# Patient Record
Sex: Female | Born: 1945 | Race: White | Hispanic: No | Marital: Single | Smoking: Never smoker
Health system: Southern US, Community
[De-identification: ages and names within clinical notes are randomized; demographics above are authoritative.]

## PROBLEM LIST (undated history)

## (undated) DIAGNOSIS — G459 Transient cerebral ischemic attack, unspecified: Secondary | ICD-10-CM

## (undated) DIAGNOSIS — I480 Paroxysmal atrial fibrillation: Secondary | ICD-10-CM

## (undated) HISTORY — PX: EYE SURGERY: SHX253

## (undated) HISTORY — DX: Transient cerebral ischemic attack, unspecified: G45.9

---

## 2000-06-19 ENCOUNTER — Emergency Department (HOSPITAL_COMMUNITY): Admission: EM | Admit: 2000-06-19 | Discharge: 2000-06-20 | Payer: Self-pay | Admitting: *Deleted

## 2000-08-26 ENCOUNTER — Encounter: Admission: RE | Admit: 2000-08-26 | Discharge: 2000-11-24 | Payer: Self-pay | Admitting: *Deleted

## 2003-03-23 ENCOUNTER — Ambulatory Visit (HOSPITAL_COMMUNITY): Admission: RE | Admit: 2003-03-23 | Discharge: 2003-03-23 | Payer: Self-pay | Admitting: Family Medicine

## 2007-10-05 ENCOUNTER — Encounter: Admission: RE | Admit: 2007-10-05 | Discharge: 2007-10-05 | Payer: Self-pay | Admitting: Family Medicine

## 2008-05-25 ENCOUNTER — Ambulatory Visit (HOSPITAL_COMMUNITY): Admission: RE | Admit: 2008-05-25 | Discharge: 2008-05-25 | Payer: Self-pay | Admitting: Family Medicine

## 2009-11-01 ENCOUNTER — Encounter: Admission: RE | Admit: 2009-11-01 | Discharge: 2009-11-01 | Payer: Self-pay | Admitting: Family Medicine

## 2010-02-18 ENCOUNTER — Ambulatory Visit (HOSPITAL_COMMUNITY): Admission: RE | Admit: 2010-02-18 | Discharge: 2010-02-18 | Payer: Self-pay | Admitting: Family Medicine

## 2010-05-01 ENCOUNTER — Encounter: Payer: Self-pay | Admitting: Family Medicine

## 2012-03-26 DIAGNOSIS — Z0000 Encounter for general adult medical examination without abnormal findings: Secondary | ICD-10-CM | POA: Insufficient documentation

## 2013-07-05 ENCOUNTER — Encounter (HOSPITAL_COMMUNITY): Payer: Self-pay | Admitting: Emergency Medicine

## 2013-07-05 ENCOUNTER — Other Ambulatory Visit (HOSPITAL_COMMUNITY): Payer: Self-pay | Admitting: Family Medicine

## 2013-07-05 ENCOUNTER — Emergency Department (HOSPITAL_COMMUNITY): Payer: Medicare Other

## 2013-07-05 ENCOUNTER — Emergency Department (HOSPITAL_COMMUNITY)
Admission: EM | Admit: 2013-07-05 | Discharge: 2013-07-05 | Disposition: A | Discharge status: Home / Self Care | Payer: Medicare Other | Attending: Emergency Medicine | Admitting: Emergency Medicine

## 2013-07-05 DIAGNOSIS — R55 Syncope and collapse: Secondary | ICD-10-CM | POA: Insufficient documentation

## 2013-07-05 DIAGNOSIS — R209 Unspecified disturbances of skin sensation: Secondary | ICD-10-CM | POA: Diagnosis present

## 2013-07-05 DIAGNOSIS — Z8673 Personal history of transient ischemic attack (TIA), and cerebral infarction without residual deficits: Secondary | ICD-10-CM | POA: Insufficient documentation

## 2013-07-05 DIAGNOSIS — R51 Headache: Secondary | ICD-10-CM | POA: Insufficient documentation

## 2013-07-05 DIAGNOSIS — R200 Anesthesia of skin: Secondary | ICD-10-CM

## 2013-07-05 LAB — CBC WITH DIFFERENTIAL/PLATELET
BASOS ABS: 0 10*3/uL (ref 0.0–0.1)
Basophils Relative: 0 % (ref 0–1)
Eosinophils Absolute: 0.1 10*3/uL (ref 0.0–0.7)
Eosinophils Relative: 2 % (ref 0–5)
HCT: 44.9 % (ref 36.0–46.0)
Hemoglobin: 14.7 g/dL (ref 12.0–15.0)
LYMPHS ABS: 2 10*3/uL (ref 0.7–4.0)
LYMPHS PCT: 32 % (ref 12–46)
MCH: 29.9 pg (ref 26.0–34.0)
MCHC: 32.7 g/dL (ref 30.0–36.0)
MCV: 91.4 fL (ref 78.0–100.0)
Monocytes Absolute: 0.6 10*3/uL (ref 0.1–1.0)
Monocytes Relative: 9 % (ref 3–12)
NEUTROS PCT: 57 % (ref 43–77)
Neutro Abs: 3.6 10*3/uL (ref 1.7–7.7)
PLATELETS: 286 10*3/uL (ref 150–400)
RBC: 4.91 MIL/uL (ref 3.87–5.11)
RDW: 12.9 % (ref 11.5–15.5)
WBC: 6.4 10*3/uL (ref 4.0–10.5)

## 2013-07-05 LAB — PROTIME-INR
INR: 0.97 (ref 0.00–1.49)
Prothrombin Time: 12.7 seconds (ref 11.6–15.2)

## 2013-07-05 LAB — COMPREHENSIVE METABOLIC PANEL
ALK PHOS: 65 U/L (ref 39–117)
ALT: 16 U/L (ref 0–35)
AST: 18 U/L (ref 0–37)
Albumin: 3.9 g/dL (ref 3.5–5.2)
BUN: 12 mg/dL (ref 6–23)
CALCIUM: 9.1 mg/dL (ref 8.4–10.5)
CO2: 27 meq/L (ref 19–32)
Chloride: 105 mEq/L (ref 96–112)
Creatinine, Ser: 0.78 mg/dL (ref 0.50–1.10)
GFR, EST NON AFRICAN AMERICAN: 85 mL/min — AB (ref >90)
GLUCOSE: 94 mg/dL (ref 70–99)
POTASSIUM: 3.6 meq/L — AB (ref 3.7–5.3)
SODIUM: 146 meq/L (ref 137–147)
TOTAL PROTEIN: 7.5 g/dL (ref 6.0–8.3)
Total Bilirubin: 0.4 mg/dL (ref 0.3–1.2)

## 2013-07-05 LAB — APTT: aPTT: 27 seconds (ref 24–37)

## 2013-07-05 LAB — TROPONIN I

## 2013-07-05 NOTE — Discharge Instructions (Signed)
Start taking a baby aspirin, 81 mg once a day. See Dr Renard MatterMcInnis this week to discuss further evaluation for possible TIA event. Return to the ED if you get the numbness back or your symptoms get worse.

## 2013-07-05 NOTE — ED Notes (Signed)
Reports episode of dizziness, facial numbness, and near syncope starting yesterday around 1500.  Reports dizziness got better but reports "left side of face feels like it's pulling to the left, with numbness."  Reports speech difficulty yesterday with trouble getting words out, but denies today.  Denies weakness.  Also reports sob.

## 2013-07-05 NOTE — ED Provider Notes (Signed)
CSN: [id number]     Arrival date & time 07/05/13  [id number] History  This chart was scribed for Ward GivensIva L Aaleeyah Bias, MD by Danella Maiersaroline Early, ED Scribe. This patient was seen in room APA19/APA19 and the patient's care was started at 8:48 AM.   Chief Complaint  Patient presents with  . facial numbness      (Consider location/radiation/quality/duration/timing/severity/associated sxs/prior Treatment) The history is provided by the patient. No language interpreter was used.   HPI Comments: Anita Fletcher is a 68 y.o. female who presents to the Emergency Department complaining of left-sided facial droop that she noticed this morning and two near-syncopal episodes at work yesterday. 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 She states her vision went black. She reports mild slurred speech that got better by speaking slowly. She states the second episode occurred minutes later when she got up to go to the bathroom.  She states she clutched the wall until it passed because she felt like passing out. She states the left side of her face felt numb yesterday but it has resolved. She denies headache, CP, diaphoresis, spinning sensation. She denies trouble ambulating, trouble using her arms, difficulty swallowing. She reports syncopal episodes in the past from heat but denies h/o facial droop or facial numbness. She states she drank a coke this morning and had no difficulty. She states this morning she was putting on her makeup and noted that her " face was pulling the left". She reports increasing stress at work recently since having an increase in workload 2-3 weeks ago. She denies smoking or drinking alcohol. She denies personal or family h/o stroke.  PCP - McInnis  History reviewed. No pertinent past medical history. History reviewed. No pertinent past surgical history. No family history on file. History  Substance Use Topics  . Smoking status: Never  Smoker   . Smokeless tobacco: Not on file  . Alcohol Use: No   OB History   Grav Para Term Preterm Abortions TAB SAB Ect Mult Living                 Review of Systems  Constitutional: Negative for diaphoresis.  HENT: Negative for trouble swallowing.   Cardiovascular: Negative for chest pain.  Neurological: Positive for syncope (near), light-headedness and numbness. Negative for headaches.  All other systems reviewed and are negative.      Allergies  Review of patient's allergies indicates no known allergies.  Home Medications  No current outpatient prescriptions on file. BP 185/115  Pulse 92  Temp(Src) 97.8 F (36.6 C) (Oral)  Resp 20  Ht 5\' 3"  (1.6 m)  Wt 160 lb (72.576 kg)  BMI 28.35 kg/m2  SpO2 96%  Vital signs normal except hypertension  Physical Exam  Nursing note and vitals reviewed. Constitutional: She is oriented to person, place, and time. She appears well-developed and well-nourished.  Non-toxic appearance. She does not appear ill. No distress.  HENT:  Head: Normocephalic and atraumatic.  Right Ear: External ear normal.  Left Ear: External ear normal.  Nose: Nose normal. No mucosal edema or rhinorrhea.  Mouth/Throat: Oropharynx is clear and moist and mucous membranes are normal. No dental abscesses or uvula swelling.  Eyes: Conjunctivae and EOM are normal. Pupils are equal, round, and reactive to light.  Neck: Normal range of motion and full passive range of motion without pain. Neck supple. Carotid bruit is not present.  Cardiovascular: Normal rate, regular rhythm  and normal heart sounds.  Exam reveals no gallop and no friction rub.   No murmur heard. Pulmonary/Chest: Effort normal and breath sounds normal. No respiratory distress. She has no wheezes. She has no rhonchi. She has no rales. She exhibits no tenderness and no crepitus.  Abdominal: Soft. Normal appearance and bowel sounds are normal. She exhibits no distension. There is no tenderness. There is  no rebound and no guarding.  Musculoskeletal: Normal range of motion. She exhibits no edema and no tenderness.  Moves all extremities well.   Neurological: She is alert and oriented to person, place, and time. She has normal strength. No cranial nerve deficit.  Grips are equal. No pronator drift. No motor weakness. She has intact sensation to light touch on both sides of her face. Her face appears symmetrical on exam.  Skin: Skin is warm, dry and intact. No rash noted. No erythema. No pallor.  Psychiatric: She has a normal mood and affect. Her speech is normal and behavior is normal. Her mood appears not anxious.    ED Course  Procedures (including critical care time) Medications - No data to display  DIAGNOSTIC STUDIES: Oxygen Saturation is 96% on RA, normal by my interpretation.    COORDINATION OF CARE: 9:01 AM- Discussed treatment plan with pt which includes blood work and CT head. Pt agrees to plan.  Pt refuses MRI even with sedation, even knowing it would give more information.   Nurse reports patient was going to refuse head CT and leave, advised she can sign out AMA. Pt decided to stay.   11:53 AM- Discussed treatment plan with pt which includes choice of being admitted or f/u with testing through outpatient. Pt states she would like to be discharged and states she feels her symptoms are related to stress at work. We also discussed starting taking a baby aspirin once a day the    Labs Review Results for orders placed during the hospital encounter of 07/05/13  CBC WITH DIFFERENTIAL      Result Value Ref Range   WBC 6.4  4.0 - 10.5 K/uL   RBC 4.91  3.87 - 5.11 MIL/uL   Hemoglobin 14.7  12.0 - 15.0 g/dL   HCT 40.9  81.1 - 91.4 %   MCV 91.4  78.0 - 100.0 fL   MCH 29.9  26.0 - 34.0 pg   MCHC 32.7  30.0 - 36.0 g/dL   RDW 78.2  95.6 - 21.3 %   Platelets 286  150 - 400 K/uL   Neutrophils Relative % 57  43 - 77 %   Neutro Abs 3.6  1.7 - 7.7 K/uL   Lymphocytes Relative 32  12 -  46 %   Lymphs Abs 2.0  0.7 - 4.0 K/uL   Monocytes Relative 9  3 - 12 %   Monocytes Absolute 0.6  0.1 - 1.0 K/uL   Eosinophils Relative 2  0 - 5 %   Eosinophils Absolute 0.1  0.0 - 0.7 K/uL   Basophils Relative 0  0 - 1 %   Basophils Absolute 0.0  0.0 - 0.1 K/uL  COMPREHENSIVE METABOLIC PANEL      Result Value Ref Range   Sodium 146  137 - 147 mEq/L   Potassium 3.6 (*) 3.7 - 5.3 mEq/L   Chloride 105  96 - 112 mEq/L   CO2 27  19 - 32 mEq/L   Glucose, Bld 94  70 - 99 mg/dL   BUN 12  6 - 23  mg/dL   Creatinine, Ser 1.61  0.50 - 1.10 mg/dL   Calcium 9.1  8.4 - 09.6 mg/dL   Total Protein 7.5  6.0 - 8.3 g/dL   Albumin 3.9  3.5 - 5.2 g/dL   AST 18  0 - 37 U/L   ALT 16  0 - 35 U/L   Alkaline Phosphatase 65  39 - 117 U/L   Total Bilirubin 0.4  0.3 - 1.2 mg/dL   GFR calc non Af Amer 85 (*) >90 mL/min   GFR calc Af Amer >90  >90 mL/min  APTT      Result Value Ref Range   aPTT 27  24 - 37 seconds  PROTIME-INR      Result Value Ref Range   Prothrombin Time 12.7  11.6 - 15.2 seconds   INR 0.97  0.00 - 1.49  TROPONIN I      Result Value Ref Range   Troponin I <0.30  <0.30 ng/mL   Laboratory interpretation all normal    Imaging Review Ct Head Wo Contrast  07/05/2013   CLINICAL DATA:  Left facial numbness  EXAM: CT HEAD WITHOUT CONTRAST  TECHNIQUE: Contiguous axial images were obtained from the base of the skull through the vertex without intravenous contrast.  COMPARISON:  None.  FINDINGS: Ventricle size is normal. Mild hypodensity in the cerebral white matter compatible with microvascular ischemia. Negative for acute infarct, hemorrhage, mass.  Right parietal calcified scalp lesion over the convexity appears benign and most likely is a sebaceous cyst. No bony change identified.  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 No acute abnormality.   Electronically Signed   By: Marlan Palau M.D.   On: 07/05/2013 10:14     EKG Interpretation   Date/Time:   Tuesday July 05 2013 08:32:55 EDT Ventricular Rate:  71 PR Interval:  148 QRS Duration: 72 QT Interval:  398 QTC Calculation: 432 R Axis:   11 Text Interpretation:  Normal sinus rhythm Possible Left atrial enlargement  T wave abnormality, consider lateral ischemia No previous ECGs available  Confirmed by Cashawn Yanko  MD-I, Sherise Geerdes ([id number]) on 07/05/2013 8:38:07 AM      MDM  patient presents with 2 episodes of left facial numbness yesterday and today feeling that the left side of her face was drawing without numbness. Patient seems anxious and does report a lot of stress at work. She has made arrangements to start a new job but that will not start for 2 months. She may have had a TIA however she was not able to cooperate for MRI of her brain. She did not want to be admitted for further testing i.e. echocardiogram and carotid ultrasound and she would not let me order them as an outpatient. She states she will follow up with Dr. Megan Mans this week in the office. She states it's very easy to get appointment to see him.    Final diagnoses:  Left facial numbness    Discharge medications ASA 81 mg daily   Plan discharge  Devoria Albe, MD, FACEP   I personally performed the services described in this documentation, which was scribed in my presence. The recorded information has been reviewed and considered.  Devoria Albe, MD, Armando Gang   Ward Givens, MD 07/05/13 [phone]

## 2013-07-05 NOTE — ED Notes (Signed)
CT tech came over to get pt. Pt states she does not want to have a CT if she is unable to be diagnosed. Pt is afraid to have a MRI due to it being enclosed. EDP aware. Pt then stated several times, I just want to leave. EDP aware.

## 2013-07-07 ENCOUNTER — Ambulatory Visit (HOSPITAL_COMMUNITY)
Admission: RE | Admit: 2013-07-07 | Discharge: 2013-07-07 | Disposition: A | Discharge status: Home / Self Care | Payer: Managed Care, Other (non HMO) | Source: Ambulatory Visit | Attending: Family Medicine | Admitting: Family Medicine

## 2013-07-07 DIAGNOSIS — I771 Stricture of artery: Secondary | ICD-10-CM | POA: Insufficient documentation

## 2013-07-07 DIAGNOSIS — I359 Nonrheumatic aortic valve disorder, unspecified: Secondary | ICD-10-CM

## 2013-07-07 DIAGNOSIS — I6529 Occlusion and stenosis of unspecified carotid artery: Secondary | ICD-10-CM | POA: Diagnosis not present

## 2013-07-07 DIAGNOSIS — R55 Syncope and collapse: Secondary | ICD-10-CM | POA: Diagnosis present

## 2013-07-07 DIAGNOSIS — I10 Essential (primary) hypertension: Secondary | ICD-10-CM | POA: Insufficient documentation

## 2013-07-07 DIAGNOSIS — I517 Cardiomegaly: Secondary | ICD-10-CM | POA: Insufficient documentation

## 2013-07-07 NOTE — Progress Notes (Signed)
*  PRELIMINARY RESULTS* Echocardiogram 2D Echocardiogram has been performed.  Renae FickleCynthia L Galia Rahm 07/07/2013, 3:23 PM

## 2015-12-11 DIAGNOSIS — H35371 Puckering of macula, right eye: Secondary | ICD-10-CM | POA: Diagnosis not present

## 2015-12-11 DIAGNOSIS — H348311 Tributary (branch) retinal vein occlusion, right eye, with retinal neovascularization: Secondary | ICD-10-CM | POA: Diagnosis not present

## 2015-12-11 DIAGNOSIS — H4311 Vitreous hemorrhage, right eye: Secondary | ICD-10-CM | POA: Diagnosis not present

## 2015-12-17 DIAGNOSIS — H348311 Tributary (branch) retinal vein occlusion, right eye, with retinal neovascularization: Secondary | ICD-10-CM | POA: Diagnosis not present

## 2015-12-17 DIAGNOSIS — H35371 Puckering of macula, right eye: Secondary | ICD-10-CM | POA: Diagnosis not present

## 2015-12-17 DIAGNOSIS — H4311 Vitreous hemorrhage, right eye: Secondary | ICD-10-CM | POA: Diagnosis not present

## 2015-12-25 DIAGNOSIS — H35371 Puckering of macula, right eye: Secondary | ICD-10-CM | POA: Diagnosis not present

## 2016-04-11 DIAGNOSIS — H5211 Myopia, right eye: Secondary | ICD-10-CM | POA: Diagnosis not present

## 2016-04-11 DIAGNOSIS — H35033 Hypertensive retinopathy, bilateral: Secondary | ICD-10-CM | POA: Diagnosis not present

## 2016-04-11 DIAGNOSIS — H52221 Regular astigmatism, right eye: Secondary | ICD-10-CM | POA: Diagnosis not present

## 2016-06-10 DIAGNOSIS — H2511 Age-related nuclear cataract, right eye: Secondary | ICD-10-CM | POA: Diagnosis not present

## 2016-06-10 DIAGNOSIS — H0230 Blepharochalasis unspecified eye, unspecified eyelid: Secondary | ICD-10-CM | POA: Diagnosis not present

## 2016-06-10 DIAGNOSIS — H348312 Tributary (branch) retinal vein occlusion, right eye, stable: Secondary | ICD-10-CM | POA: Diagnosis not present

## 2016-06-10 DIAGNOSIS — H02839 Dermatochalasis of unspecified eye, unspecified eyelid: Secondary | ICD-10-CM | POA: Diagnosis not present

## 2016-06-10 DIAGNOSIS — H18413 Arcus senilis, bilateral: Secondary | ICD-10-CM | POA: Diagnosis not present

## 2016-06-10 DIAGNOSIS — H2513 Age-related nuclear cataract, bilateral: Secondary | ICD-10-CM | POA: Diagnosis not present

## 2016-07-28 DIAGNOSIS — H2511 Age-related nuclear cataract, right eye: Secondary | ICD-10-CM | POA: Diagnosis not present

## 2016-08-04 DIAGNOSIS — H2511 Age-related nuclear cataract, right eye: Secondary | ICD-10-CM | POA: Diagnosis not present

## 2016-10-28 DIAGNOSIS — H34831 Tributary (branch) retinal vein occlusion, right eye, with macular edema: Secondary | ICD-10-CM | POA: Diagnosis not present

## 2016-12-16 DIAGNOSIS — M545 Low back pain: Secondary | ICD-10-CM | POA: Diagnosis not present

## 2016-12-16 DIAGNOSIS — M5489 Other dorsalgia: Secondary | ICD-10-CM | POA: Diagnosis not present

## 2016-12-16 DIAGNOSIS — I10 Essential (primary) hypertension: Secondary | ICD-10-CM | POA: Diagnosis not present

## 2016-12-28 DIAGNOSIS — H6980 Other specified disorders of Eustachian tube, unspecified ear: Secondary | ICD-10-CM | POA: Diagnosis not present

## 2016-12-28 DIAGNOSIS — J069 Acute upper respiratory infection, unspecified: Secondary | ICD-10-CM | POA: Diagnosis not present

## 2016-12-28 DIAGNOSIS — H6121 Impacted cerumen, right ear: Secondary | ICD-10-CM | POA: Diagnosis not present

## 2018-12-27 DIAGNOSIS — H5213 Myopia, bilateral: Secondary | ICD-10-CM | POA: Diagnosis not present

## 2018-12-27 DIAGNOSIS — H35033 Hypertensive retinopathy, bilateral: Secondary | ICD-10-CM | POA: Diagnosis not present

## 2018-12-27 DIAGNOSIS — I10 Essential (primary) hypertension: Secondary | ICD-10-CM | POA: Diagnosis not present

## 2019-01-17 DIAGNOSIS — H35371 Puckering of macula, right eye: Secondary | ICD-10-CM | POA: Diagnosis not present

## 2019-04-24 ENCOUNTER — Ambulatory Visit: Payer: Medicare HMO | Attending: Internal Medicine

## 2019-04-24 DIAGNOSIS — Z23 Encounter for immunization: Secondary | ICD-10-CM | POA: Insufficient documentation

## 2019-04-24 NOTE — Progress Notes (Signed)
   Covid-19 Vaccination Clinic  Name:  Anita Fletcher    MRN: [id number] DOB: 05/08/1945  04/24/2019  Anita Fletcher was observed post Covid-19 immunization for 15 minutes without incidence. She was provided with Vaccine Information Sheet and instruction to access the V-Safe system.   Anita Fletcher was instructed to call 911 with any severe reactions post vaccine: Marland Kitchen Difficulty breathing  . Swelling of your face and throat  . A fast heartbeat  . A bad rash all over your body  . Dizziness and weakness    Immunizations Administered    Name Date Dose VIS Date Route   Pfizer COVID-19 Vaccine 04/24/2019  1:49 PM 0.3 mL 03/11/2019 Intramuscular   Manufacturer: [employer], [employer]   Lot: [id number]   NDC: [address]-1

## 2019-05-16 ENCOUNTER — Ambulatory Visit: Payer: Medicare HMO | Attending: Internal Medicine

## 2019-05-16 DIAGNOSIS — Z23 Encounter for immunization: Secondary | ICD-10-CM | POA: Insufficient documentation

## 2019-05-16 NOTE — Progress Notes (Signed)
   Covid-19 Vaccination Clinic  Name:  Anita Fletcher    MRN: [id number] DOB: 1946-02-09  05/16/2019  Anita Fletcher was observed post Covid-19 immunization for 15 minutes without incidence. She was provided with Vaccine Information Sheet and instruction to access the V-Safe system.   Anita Fletcher was instructed to call 911 with any severe reactions post vaccine: Marland Kitchen Difficulty breathing  . Swelling of your face and throat  . A fast heartbeat  . A bad rash all over your body  . Dizziness and weakness    Immunizations Administered    Name Date Dose VIS Date Route   Pfizer COVID-19 Vaccine 05/16/2019 12:08 PM 0.3 mL 03/11/2019 Intramuscular   Manufacturer: [employer], [employer]   Lot: [id number]   NDC: [address]-1

## 2020-03-29 ENCOUNTER — Ambulatory Visit: Payer: Medicare HMO

## 2020-04-05 ENCOUNTER — Ambulatory Visit: Payer: Medicare HMO

## 2020-04-06 ENCOUNTER — Ambulatory Visit: Payer: Medicare HMO | Attending: Internal Medicine

## 2020-04-06 DIAGNOSIS — Z23 Encounter for immunization: Secondary | ICD-10-CM

## 2020-04-06 NOTE — Progress Notes (Signed)
   Covid-19 Vaccination Clinic  Name:  Anita Fletcher    MRN: [id number] DOB: 1946-01-04  04/06/2020  [name] was observed post Covid-19 immunization for 15 minutes without incident. She was provided with Vaccine Information Sheet and instruction to access the V-Safe system.   [name] was instructed to call 911 with any severe reactions post vaccine: Marland Kitchen Difficulty breathing  . Swelling of face and throat  . A fast heartbeat  . A bad rash all over body  . Dizziness and weakness   Immunizations Administered    Name Date Dose VIS Date Route   Pfizer COVID-19 Vaccine 04/06/2020  2:47 PM 0.3 mL 01/18/2020 Intramuscular   Manufacturer: [employer], [employer]   Lot: [id number]   NDC: [address]-7

## 2020-06-04 DIAGNOSIS — L7211 Pilar cyst: Secondary | ICD-10-CM | POA: Diagnosis not present

## 2020-06-04 DIAGNOSIS — L700 Acne vulgaris: Secondary | ICD-10-CM | POA: Diagnosis not present

## 2020-06-04 DIAGNOSIS — L820 Inflamed seborrheic keratosis: Secondary | ICD-10-CM | POA: Diagnosis not present

## 2020-09-24 DIAGNOSIS — Z1159 Encounter for screening for other viral diseases: Secondary | ICD-10-CM | POA: Diagnosis not present

## 2020-09-24 DIAGNOSIS — I10 Essential (primary) hypertension: Secondary | ICD-10-CM | POA: Diagnosis not present

## 2021-03-25 DIAGNOSIS — H25041 Posterior subcapsular polar age-related cataract, right eye: Secondary | ICD-10-CM | POA: Diagnosis not present

## 2021-05-07 DIAGNOSIS — N3000 Acute cystitis without hematuria: Secondary | ICD-10-CM | POA: Diagnosis not present

## 2021-05-07 DIAGNOSIS — R300 Dysuria: Secondary | ICD-10-CM | POA: Diagnosis not present

## 2021-05-09 DIAGNOSIS — H26491 Other secondary cataract, right eye: Secondary | ICD-10-CM | POA: Diagnosis not present

## 2021-05-09 DIAGNOSIS — H2512 Age-related nuclear cataract, left eye: Secondary | ICD-10-CM | POA: Diagnosis not present

## 2021-05-09 DIAGNOSIS — H348312 Tributary (branch) retinal vein occlusion, right eye, stable: Secondary | ICD-10-CM | POA: Diagnosis not present

## 2021-05-09 DIAGNOSIS — H18413 Arcus senilis, bilateral: Secondary | ICD-10-CM | POA: Diagnosis not present

## 2021-08-24 ENCOUNTER — Emergency Department (HOSPITAL_COMMUNITY)
Admission: EM | Admit: 2021-08-24 | Discharge: 2021-08-24 | Disposition: A | Discharge status: Home / Self Care | Payer: Medicare HMO | Attending: Emergency Medicine | Admitting: Emergency Medicine

## 2021-08-24 ENCOUNTER — Emergency Department (HOSPITAL_COMMUNITY): Payer: Medicare HMO

## 2021-08-24 ENCOUNTER — Encounter (HOSPITAL_COMMUNITY): Payer: Self-pay | Admitting: Emergency Medicine

## 2021-08-24 ENCOUNTER — Other Ambulatory Visit: Payer: Self-pay

## 2021-08-24 DIAGNOSIS — I4891 Unspecified atrial fibrillation: Secondary | ICD-10-CM | POA: Diagnosis not present

## 2021-08-24 DIAGNOSIS — R0689 Other abnormalities of breathing: Secondary | ICD-10-CM | POA: Diagnosis not present

## 2021-08-24 DIAGNOSIS — R0789 Other chest pain: Secondary | ICD-10-CM | POA: Diagnosis not present

## 2021-08-24 DIAGNOSIS — R079 Chest pain, unspecified: Secondary | ICD-10-CM | POA: Diagnosis not present

## 2021-08-24 DIAGNOSIS — Z7901 Long term (current) use of anticoagulants: Secondary | ICD-10-CM | POA: Insufficient documentation

## 2021-08-24 DIAGNOSIS — I499 Cardiac arrhythmia, unspecified: Secondary | ICD-10-CM | POA: Diagnosis not present

## 2021-08-24 DIAGNOSIS — R0602 Shortness of breath: Secondary | ICD-10-CM | POA: Diagnosis not present

## 2021-08-24 DIAGNOSIS — I480 Paroxysmal atrial fibrillation: Secondary | ICD-10-CM | POA: Diagnosis not present

## 2021-08-24 LAB — CBC WITH DIFFERENTIAL/PLATELET
Abs Immature Granulocytes: 0.02 10*3/uL (ref 0.00–0.07)
Basophils Absolute: 0 10*3/uL (ref 0.0–0.1)
Basophils Relative: 0 %
Eosinophils Absolute: 0.1 10*3/uL (ref 0.0–0.5)
Eosinophils Relative: 1 %
HCT: 48.6 % — ABNORMAL HIGH (ref 36.0–46.0)
Hemoglobin: 16.3 g/dL — ABNORMAL HIGH (ref 12.0–15.0)
Immature Granulocytes: 0 %
Lymphocytes Relative: 15 %
Lymphs Abs: 1.4 10*3/uL (ref 0.7–4.0)
MCH: 31 pg (ref 26.0–34.0)
MCHC: 33.5 g/dL (ref 30.0–36.0)
MCV: 92.4 fL (ref 80.0–100.0)
Monocytes Absolute: 0.7 10*3/uL (ref 0.1–1.0)
Monocytes Relative: 7 %
Neutro Abs: 7.3 10*3/uL (ref 1.7–7.7)
Neutrophils Relative %: 77 %
Platelets: 276 10*3/uL (ref 150–400)
RBC: 5.26 MIL/uL — ABNORMAL HIGH (ref 3.87–5.11)
RDW: 12.9 % (ref 11.5–15.5)
WBC: 9.4 10*3/uL (ref 4.0–10.5)
nRBC: 0 % (ref 0.0–0.2)

## 2021-08-24 LAB — COMPREHENSIVE METABOLIC PANEL
ALT: 15 U/L (ref 0–44)
AST: 20 U/L (ref 15–41)
Albumin: 3.9 g/dL (ref 3.5–5.0)
Alkaline Phosphatase: 58 U/L (ref 38–126)
Anion gap: 6 (ref 5–15)
BUN: 11 mg/dL (ref 8–23)
CO2: 26 mmol/L (ref 22–32)
Calcium: 8.9 mg/dL (ref 8.9–10.3)
Chloride: 109 mmol/L (ref 98–111)
Creatinine, Ser: 0.84 mg/dL (ref 0.44–1.00)
GFR, Estimated: >60 mL/min (ref >60)
Glucose, Bld: 156 mg/dL — ABNORMAL HIGH (ref 70–99)
Potassium: 3.6 mmol/L (ref 3.5–5.1)
Sodium: 141 mmol/L (ref 135–145)
Total Bilirubin: 0.6 mg/dL (ref 0.3–1.2)
Total Protein: 6.8 g/dL (ref 6.5–8.1)

## 2021-08-24 LAB — TSH: TSH: 1.526 u[IU]/mL (ref 0.350–4.500)

## 2021-08-24 LAB — MAGNESIUM: Magnesium: 2 mg/dL (ref 1.7–2.4)

## 2021-08-24 MED ORDER — METOPROLOL TARTRATE 25 MG PO TABS: 12.5000 mg | ORAL_TABLET | Freq: Every day | ORAL | 0 refills | Status: DC

## 2021-08-24 MED ORDER — APIXABAN 5 MG PO TABS
5.0000 mg | ORAL_TABLET | Freq: Two times a day (BID) | ORAL | Status: DC
  Administered 2021-08-24: 5 mg via ORAL
  Filled 2021-08-24: qty 1

## 2021-08-24 MED ORDER — APIXABAN 5 MG PO TABS: 5.0000 mg | ORAL_TABLET | Freq: Two times a day (BID) | ORAL | 0 refills | Status: DC

## 2021-08-24 MED ORDER — METOPROLOL TARTRATE 25 MG PO TABS
25.0000 mg | ORAL_TABLET | Freq: Once | ORAL | Status: AC
  Administered 2021-08-24: 25 mg via ORAL
  Filled 2021-08-24: qty 1

## 2021-08-24 NOTE — Discharge Instructions (Addendum)
Like we discussed, I prescribed you 2 medications.  The first medication is called Eliquis.  This is a blood thinning medication.  Please take this as prescribed.  It is important you take this medication to help prevent blood clots.  The second medication is called metoprolol.  This medication can help lower your heart rate and hopefully help prevent recurrent bouts of atrial fibrillation.  Please take this once per day as prescribed.  Below is the contact information for cardiology.  Please give them a call as soon as possible to schedule an appointment for reevaluation.  I have also placed an ambulatory referral to cardiology and they should be reaching out to you as well.  If you develop any new or worsening symptoms whatsoever please come back to the emergency department immediately for reevaluation.

## 2021-08-24 NOTE — ED Provider Notes (Signed)
[facility] EMERGENCY DEPARTMENT Provider Note   CSN: [id number] Arrival date & time: 08/24/21  1430     History  Chief Complaint  Patient presents with   Atrial Fibrillation    Anita Fletcher is a 76 y.o. female.  HPI Patient is a 76 year old female who presents to the emergency department due to shortness of breath, lightheadedness, palpitations, chest pressure that began this morning around 6 AM after waking.  States that she was feeling normal yesterday.  States that her symptoms were persistent all morning.  Denies any syncope.  EMS was contacted and found patient to be in atrial fibrillation with RVR.  She was given an 18 mg bolus of Cardizem and RVR resolved.  On arrival repeat ECG shows atrial fibrillation once again.  On my exam patient is now appears to be back in sinus rhythm.  Currently denies any somatic complaints.      Home Medications Prior to Admission medications   Medication Sig Start Date End Date Taking? Authorizing Provider  apixaban (ELIQUIS) 5 MG TABS tablet Take 1 tablet (5 mg total) by mouth 2 (two) times daily. 08/24/21 09/23/21 Yes Rayna Sexton, PA-C  metoprolol tartrate (LOPRESSOR) 25 MG tablet Take 0.5 tablets (12.5 mg total) by mouth daily. 08/24/21  Yes Rayna Sexton, PA-C      Allergies    Codeine    Review of Systems   Review of Systems  All other systems reviewed and are negative. Ten systems reviewed and are negative for acute change, except as noted in the HPI.   Physical Exam Updated Vital Signs BP (!) 137/95   Pulse (!) 49   Temp 97.6 F (36.4 C) (Oral)   Resp 18   Ht 5\' 4"  (1.626 m)   Wt 74.8 kg   SpO2 95%   BMI 28.32 kg/m  Physical Exam Vitals and nursing note reviewed.  Constitutional:      General: She is not in acute distress.    Appearance: Normal appearance. She is not ill-appearing, toxic-appearing or diaphoretic.  HENT:     Head: Normocephalic and atraumatic.     Right Ear: External ear normal.     Left Ear:  External ear normal.     Nose: Nose normal.     Mouth/Throat:     Mouth: Mucous membranes are moist.     Pharynx: Oropharynx is clear. No oropharyngeal exudate or posterior oropharyngeal erythema.  Eyes:     General: No scleral icterus.       Right eye: No discharge.        Left eye: No discharge.     Extraocular Movements: Extraocular movements intact.     Conjunctiva/sclera: Conjunctivae normal.  Cardiovascular:     Rate and Rhythm: Normal rate and regular rhythm.     Pulses: Normal pulses.     Heart sounds: Normal heart sounds. No murmur heard.   No friction rub. No gallop.  Pulmonary:     Effort: Pulmonary effort is normal. No respiratory distress.     Breath sounds: Normal breath sounds. No stridor. No wheezing, rhonchi or rales.  Abdominal:     General: Abdomen is flat.     Palpations: Abdomen is soft.     Tenderness: There is no abdominal tenderness.  Musculoskeletal:        General: Normal range of motion.     Cervical back: Normal range of motion and neck supple. No tenderness.  Skin:    General: Skin is warm and dry.  Neurological:     General: No focal deficit present.     Mental Status: She is alert and oriented to person, place, and time.  Psychiatric:        Mood and Affect: Mood normal.        Behavior: Behavior normal.   ED Results / Procedures / Treatments   Labs (all labs ordered are listed, but only abnormal results are displayed) Labs Reviewed  COMPREHENSIVE METABOLIC PANEL - Abnormal; Notable for the following components:      Result Value   Glucose, Bld 156 (*)    All other components within normal limits  CBC WITH DIFFERENTIAL/PLATELET - Abnormal; Notable for the following components:   RBC 5.26 (*)    Hemoglobin 16.3 (*)    HCT 48.6 (*)    All other components within normal limits  MAGNESIUM  TSH   EKG EKG Interpretation  Date/Time:  Saturday Aug 24 2021 14:37:28 EDT Ventricular Rate:  96 PR Interval:    QRS Duration: 75 QT  Interval:  355 QTC Calculation: 449 R Axis:   12 Text Interpretation: Atrial fibrillation Minimal ST depression, lateral leads Since last tracing Atrial fibrillation NOW PRESENT Confirmed by Noemi Chapel [phone]) on 08/24/2021 3:12:03 PM  Radiology DG Chest Portable 1 View  Result Date: 08/24/2021 CLINICAL DATA:  Atrial fibrillation, chest pressure, short of breath EXAM: PORTABLE CHEST 1 VIEW COMPARISON:  11/01/2009 FINDINGS: Single frontal view of the chest demonstrates a stable cardiac silhouette. Large hiatal hernia. Linear consolidation at the left lung base likely atelectasis or scarring. No acute airspace disease, effusion, or pneumothorax. No acute bony abnormalities. IMPRESSION: 1. Linear left basilar consolidation, most consistent with atelectasis or scarring. 2. Large hiatal hernia. Electronically Signed   By: Randa Ngo M.D.   On: 08/24/2021 15:49    Procedures Procedures   Medications Ordered in ED Medications  metoprolol tartrate (LOPRESSOR) tablet 25 mg (25 mg Oral Given 08/24/21 1528)   ED Course/ Medical Decision Making/ A&P Clinical Course as of 08/24/21 1640  Sat Aug 24, 2021  1506 Patient discussed with and evaluated by my attending physician Dr. Noemi Chapel.  Agrees with work-up.  Recommends additional oral metoprolol.  Monitor shows sinus rhythm at this time.  We will repeat ECG.  We will continue to closely monitor.  Close outpatient follow-up with cardiology. [LJ]  1544 Repeat ECG shows supraventricular bigeminy.  Appears to be back in sinus rhythm.  Awaiting lab work results.  We will likely discharge patient on anticoagulation as well as low-dose metoprolol. [LJ]    Clinical Course User Index [LJ] Rayna Sexton, PA-C                           Medical Decision Making Amount and/or Complexity of Data Reviewed Labs: ordered. Radiology: ordered.  Risk Prescription drug management.  Pt is a 76 y.o. female who presents to the emergency department due to  shortness of breath, lightheadedness, weakness that started this morning.  EMS was called and she was found to be in atrial fibrillation with RVR.  No history of atrial fibrillation in the past.  She is not anticoagulated.  She was given a Cardizem bolus and RVR resolved and patient remained in atrial fibrillation upon arrival.  Labs: CBC with RBCs of 5.26, hemoglobin of 16.3, hematocrit of 48.6. CMP with a glucose of 156. TSH within normal limits at 1.526. Magnesium within normal limits at 2.  Imaging: Chest x-ray shows linear  left basilar consolidation, most consistent with atelectasis or scarring.  Large hiatal hernia.  I, Rayna Sexton, PA-C, personally reviewed and evaluated these images and lab results as part of my medical decision-making.  On my initial exam patient appears to be in sinus rhythm.  No murmurs, rubs, or gallops.  Lungs are clear to auscultation bilaterally.  She was given additional Lopressor.  She was then reassessed on multiple occasions and heart rate appears to be stable in the mid 60s to low 70s.  Still appears to be in sinus rhythm.  States that her symptoms have resolved.  She was ambulated by the nursing staff and appears to be able to ambulate without difficulty.  Lab work obtained and appears to be generally reassuring.  Elevated RBCs, hemoglobin, and hematocrit.  Glucose 156.  Otherwise, CBC without leukocytosis.  No electrolyte derangements noted on CMP.  Normal kidney function.  No transaminitis.  Magnesium and TSH within normal limits.  Given patient's new onset of atrial fibrillation both myself as well as my attending physician Dr. Noemi Chapel who evaluated the patient agree that she should be started on anticoagulation.  We will start patient on Eliquis at this time.  First dose given in the emergency department.  We will also start patient on a low-dose Lopressor.  She was given a referral to cardiology and I also put in an ambulatory referral as  well.  Patient appears stable for discharge at this time and she is agreeable.  We discussed return precautions at length.  Her questions were answered and she was amicable at the time of discharge.  Note: Portions of this report may have been transcribed using voice recognition software. Every effort was made to ensure accuracy; however, inadvertent computerized transcription errors may be present.   Final Clinical Impression(s) / ED Diagnoses Final diagnoses:  Paroxysmal atrial fibrillation ([place])   Rx / DC Orders ED Discharge Orders          Ordered    Ambulatory referral to Cardiology       Comments: If you have not heard from the Cardiology office within the next 72 hours please call [phone].   08/24/21 1515    metoprolol tartrate (LOPRESSOR) 25 MG tablet  Daily        08/24/21 1628    apixaban (ELIQUIS) 5 MG TABS tablet  2 times daily        08/24/21 1628              Rayna Sexton, PA-C 08/24/21 1643    Noemi Chapel, MD 08/24/21 Einar Crow

## 2021-08-24 NOTE — ED Triage Notes (Signed)
Pt arrived via [facility], Per EMS,  c/o new onset a.fib with RVR; Howevern, RVR resolved after 18mg  bolus dose of Cardizem. Started at 6am this morning with SOB, chest pressure, and dizziness

## 2021-08-24 NOTE — ED Notes (Signed)
 bolus of NaCl given by [facility] prior to ED arrival

## 2021-08-24 NOTE — Progress Notes (Signed)
ANTICOAGULATION CONSULT NOTE - Initial Consult  Pharmacy Consult for eliquis Indication: atrial fibrillation  Allergies  Allergen Reactions   Codeine     Patient Measurements: Height: 5\' 4"  (162.6 cm) Weight: 74.8 kg (165 lb) IBW/kg (Calculated) : 54.7  Vital Signs: Temp: 97.6 F (36.4 C) (05/27 1436) Temp Source: Oral (05/27 1436) BP: 137/95 (05/27 1600) Pulse Rate: 49 (05/27 1600)  Labs: Recent Labs    08/24/21 1507  HGB 16.3*  HCT 48.6*  PLT 276  CREATININE 0.84    Estimated Creatinine Clearance: 58.2 mL/min (by C-G formula based on SCr of 0.84 mg/dL).   Medical History: History reviewed. No pertinent past medical history.  Medications:  See med rec  Assessment:  76 year old female who presents to the emergency department due to shortness of breath, lightheadedness, palpitations, chest pressure that began this morning around 6 AM after waking. Patient diagnosed with new onset afib. Pharmacy asked to start eliquis.  Goal of Therapy:   Monitor platelets by anticoagulation protocol: Yes   Plan:  Eliquis 5mg  po BID Educate on eliquis Monitor for S/S of bleeding  Isac Sarna, BS Pharm D, BCPS Clinical Pharmacist 08/24/2021,4:36 PM

## 2021-08-24 NOTE — ED Provider Notes (Signed)
This patient is a very pleasant 76 year old female presenting with lightheadedness and palpitations, she was given Cardizem prehospital with improvement in heart rate but still on arrival was in atrial fibrillation.  At the time of exam the patient appears to have slowed down into the 70s and appears more consistent, possibly back into sinus rhythm, will repeat EKG.  She is not having any chest pain, she states that she is essentially back to her normal self.  She will likely need to follow-up with cardiology as an outpatient, until then we will start Eliquis, beta-blocker, patient agreeable to the plan and well-appearing at this time.  PCP and cardiology f/u anticipated  Medical screening examination/treatment/procedure(s) were conducted as a shared visit with non-physician practitioner(s) and myself.  I personally evaluated the patient during the encounter.  Clinical Impression:   Final diagnoses:  None         Eber Hong, MD 08/24/21 [phone]

## 2021-08-24 NOTE — ED Notes (Signed)
Per Isac Sarna, RPH, the [facility] is giving pt a voucher for ELIQUIS 30  day free supply before discharge home.This RN gave pt the 30 day supply of eliquis. Pt spoke with the [facility] and did not have any questions pertaining to medication after pt education

## 2021-09-09 ENCOUNTER — Ambulatory Visit: Payer: Medicare HMO | Admitting: Internal Medicine

## 2021-09-15 DIAGNOSIS — R051 Acute cough: Secondary | ICD-10-CM | POA: Diagnosis not present

## 2021-09-15 DIAGNOSIS — I10 Essential (primary) hypertension: Secondary | ICD-10-CM | POA: Diagnosis not present

## 2021-09-16 ENCOUNTER — Ambulatory Visit: Payer: Medicare HMO | Admitting: Internal Medicine

## 2021-09-23 ENCOUNTER — Ambulatory Visit (INDEPENDENT_AMBULATORY_CARE_PROVIDER_SITE_OTHER): Payer: Medicare HMO

## 2021-09-23 ENCOUNTER — Encounter: Payer: Self-pay | Admitting: Cardiovascular Disease

## 2021-09-23 ENCOUNTER — Ambulatory Visit: Payer: Medicare HMO | Admitting: Cardiovascular Disease

## 2021-09-23 VITALS — BP 142/96 | HR 64 | Ht 63.0 in | Wt 162.8 lb

## 2021-09-23 DIAGNOSIS — I480 Paroxysmal atrial fibrillation: Secondary | ICD-10-CM

## 2021-09-23 DIAGNOSIS — I10 Essential (primary) hypertension: Secondary | ICD-10-CM

## 2021-09-23 MED ORDER — APIXABAN 5 MG PO TABS: 5.0000 mg | ORAL_TABLET | Freq: Two times a day (BID) | ORAL | 11 refills | Status: DC

## 2021-09-23 MED ORDER — METOPROLOL TARTRATE 25 MG PO TABS: 25.0000 mg | ORAL_TABLET | Freq: Every day | ORAL | 3 refills | Status: DC

## 2021-09-24 ENCOUNTER — Telehealth: Payer: Self-pay | Admitting: Cardiovascular Disease

## 2021-09-24 DIAGNOSIS — I480 Paroxysmal atrial fibrillation: Secondary | ICD-10-CM

## 2021-09-24 MED ORDER — METOPROLOL TARTRATE 25 MG PO TABS: 25.0000 mg | ORAL_TABLET | Freq: Two times a day (BID) | ORAL | 3 refills | Status: DC

## 2021-09-24 NOTE — Telephone Encounter (Signed)
Pt updated and verbalized understanding.  Med list updated and sent to pharmacy   O'Neal, Ronnald Ramp, MD  You; Darene Lamer, LPN 5 hours ago (9:41 AM)    25 mg BID. We increased to help get her BP lower too.   Gerri Spore T. Flora Lipps, MD, [facility]  [facility]  [address], [address]  [place], [place] [address]  [phone]  9:41 AM

## 2021-10-04 ENCOUNTER — Ambulatory Visit (HOSPITAL_COMMUNITY)
Admission: RE | Admit: 2021-10-04 | Discharge: 2021-10-04 | Disposition: A | Discharge status: Home / Self Care | Payer: Medicare HMO | Source: Ambulatory Visit | Attending: Cardiovascular Disease | Admitting: Cardiovascular Disease

## 2021-10-04 DIAGNOSIS — I480 Paroxysmal atrial fibrillation: Secondary | ICD-10-CM | POA: Insufficient documentation

## 2021-10-04 LAB — ECHOCARDIOGRAM COMPLETE
Area-P 1/2: 2.13 cm2
P 1/2 time: 672 msec
S' Lateral: 3 cm

## 2021-10-04 NOTE — Progress Notes (Signed)
*  PRELIMINARY RESULTS* Echocardiogram 2D Echocardiogram has been performed.  Stacey Drain 10/04/2021, 2:44 PM

## 2021-10-14 DIAGNOSIS — I480 Paroxysmal atrial fibrillation: Secondary | ICD-10-CM | POA: Diagnosis not present

## 2021-10-29 DIAGNOSIS — I480 Paroxysmal atrial fibrillation: Secondary | ICD-10-CM | POA: Diagnosis not present

## 2021-11-04 ENCOUNTER — Telehealth: Payer: Self-pay | Admitting: Cardiovascular Disease

## 2021-11-04 NOTE — Telephone Encounter (Signed)
Called patient, advised of results.   Thanks!

## 2021-11-04 NOTE — Telephone Encounter (Signed)
Patient is returning calling. Please advise

## 2021-12-06 ENCOUNTER — Ambulatory Visit (HOSPITAL_COMMUNITY)
Admission: RE | Admit: 2021-12-06 | Payer: Medicare HMO | Source: Ambulatory Visit | Attending: Cardiovascular Disease | Admitting: Cardiovascular Disease

## 2021-12-11 ENCOUNTER — Ambulatory Visit (HOSPITAL_COMMUNITY): Payer: Medicare HMO

## 2021-12-13 ENCOUNTER — Telehealth (HOSPITAL_COMMUNITY): Payer: Self-pay | Admitting: *Deleted

## 2021-12-13 NOTE — Telephone Encounter (Signed)
Close encounter 

## 2021-12-17 ENCOUNTER — Ambulatory Visit (HOSPITAL_COMMUNITY)
Admission: RE | Admit: 2021-12-17 | Discharge: 2021-12-17 | Disposition: A | Discharge status: Home / Self Care | Payer: Medicare HMO | Source: Ambulatory Visit | Attending: Cardiology | Admitting: Cardiology

## 2021-12-17 DIAGNOSIS — I480 Paroxysmal atrial fibrillation: Secondary | ICD-10-CM | POA: Insufficient documentation

## 2021-12-17 LAB — MYOCARDIAL PERFUSION IMAGING
LV dias vol: 98 mL (ref 46–106)
LV sys vol: 49 mL
Nuc Stress EF: 50 %
Peak HR: 64 {beats}/min
Rest HR: 49 {beats}/min
Rest Nuclear Isotope Dose: 9.9 mCi
SDS: 4
SRS: 4
SSS: 8
ST Depression (mm): 0 mm
Stress Nuclear Isotope Dose: 30.3 mCi
TID: 1.09

## 2021-12-17 MED ORDER — TECHNETIUM TC 99M TETROFOSMIN IV KIT
9.9000 | PACK | Freq: Once | INTRAVENOUS | Status: AC | PRN
  Administered 2021-12-17: 9.9 via INTRAVENOUS

## 2021-12-17 MED ORDER — TECHNETIUM TC 99M TETROFOSMIN IV KIT
30.3000 | PACK | Freq: Once | INTRAVENOUS | Status: AC | PRN
  Administered 2021-12-17: 30.3 via INTRAVENOUS

## 2021-12-17 MED ORDER — AMINOPHYLLINE 25 MG/ML IV SOLN
75.0000 mg | Freq: Once | INTRAVENOUS | Status: AC
  Administered 2021-12-17: 75 mg via INTRAVENOUS

## 2021-12-17 MED ORDER — REGADENOSON 0.4 MG/5ML IV SOLN
0.4000 mg | Freq: Once | INTRAVENOUS | Status: AC
  Administered 2021-12-17: 0.4 mg via INTRAVENOUS

## 2021-12-23 ENCOUNTER — Telehealth: Payer: Self-pay | Admitting: Cardiovascular Disease

## 2021-12-23 NOTE — Telephone Encounter (Signed)
Pt calling for update on lexiscan results. Informed her Dr. Audie Box reviewed it today, will send message to nurse to go over it with you.

## 2021-12-23 NOTE — Telephone Encounter (Signed)
Called pt, went over results. She verbalized understanding. She already has an appt scheduled for 10/19 with Dr. Audie Box. He is out of the office until that week.

## 2022-01-15 NOTE — Progress Notes (Unsigned)
Cardiology Office Note:   Date:  01/16/2022  NAME:  Anita Fletcher    MRN: [id number] DOB:  [birth year]   PCP:  Patient, No Pcp Per  Cardiologist:  None  Electrophysiologist:  None   Referring MD: No ref. provider found   Chief Complaint  Patient presents with   Follow-up   History of Present Illness:   Anita Fletcher is a 76 y.o. female with a hx of paroxysmal atrial fibrillation who presents for follow-up.  Underwent echocardiogram which shows normal LV function.  Concerns for anterior ischemia on nuclear medicine stress test.  Monitor shows no recurrence of her atrial fibrillation. Blood pressure elevated today 160/100.  Reports values are elevated when she checks her blood pressure at home.  She needs to be on different medication.  No recurrence of atrial fibrillation.  She has a cardia mobile.  She reports she is not exercising but has no chest pain or trouble breathing.  She does snore and is fatigued but declines a home sleep study.  We discussed this could be contributing to her atrial fibrillation as well as hypertension.  She denies any other symptoms in office.  No bleeding on Eliquis.  No recurrence of A-fib.   Problem List Paroxysmal Atrial fibrillation  -08/24/2021 -CHADSVASc = 3 (age, HTN, female) 2. HTN  Past Medical History: Past Medical History:  Diagnosis Date   TIA (transient ischemic attack)     Past Surgical History: Past Surgical History:  Procedure Laterality Date   EYE SURGERY      Current Medications: Current Meds  Medication Sig   apixaban (ELIQUIS) 5 MG TABS tablet Take 1 tablet (5 mg total) by mouth 2 (two) times daily.   losartan (COZAAR) 50 MG tablet Take 1 tablet (50 mg total) by mouth daily.   metoprolol tartrate (LOPRESSOR) 25 MG tablet Take 1 tablet (25 mg total) by mouth 2 (two) times daily.     Allergies:    Codeine   Social History: Social History   Socioeconomic History   Marital status: Single    Spouse name: Not on file    Number of children: 0   Years of education: Not on file   Highest education level: Not on file  Occupational History   Occupation: Resume building  Tobacco Use   Smoking status: Never   Smokeless tobacco: Not on file  Substance and Sexual Activity   Alcohol use: No   Drug use: No   Sexual activity: Not on file  Other Topics Concern   Not on file  Social History Narrative   Not on file   Social Determinants of Health   Financial Resource Strain: Not on file  Food Insecurity: Not on file  Transportation Needs: Not on file  Physical Activity: Not on file  Stress: Not on file  Social Connections: Not on file     Family History: The patient's family history includes Arrhythmia in her brother.  ROS:   All other ROS reviewed and negative. Pertinent positives noted in the HPI.     EKGs/Labs/Other Studies Reviewed:   The following studies were personally reviewed by me today:  NM Stress 12/17/2021   Findings are consistent with ischemia. The study is low-intermediate risk.   No ST deviation was noted. ECG rhythm shows sinus bradycardia at rest.   LV perfusion is abnormal. There is evidence of ischemia. Defect 1: There is a small defect with moderate reduction in uptake present in the apical to mid anterior location(s)  that is partially reversible (see graphic). There is abnormal wall motion in the defect area. Consistent with ischemia given anteroapical wall motion abnormality.   Left ventricular function is grossly normal. Nuclear stress EF: 50 %, however visually appears closer to 60%.  End diastolic cavity size is normal. End systolic cavity size is normal.   Prior study not available for comparison.  TTE 10/04/2021  1. Left ventricular ejection fraction, by estimation, is 60 to 65%. The  left ventricle has normal function. The left ventricle has no regional  wall motion abnormalities. There is severe left ventricular hypertrophy of  the basal-septal segment. Left  ventricular  diastolic parameters are consistent with Grade I diastolic  dysfunction (impaired relaxation).   2. Right ventricular systolic function is normal. The right ventricular  size is normal. There is normal pulmonary artery systolic pressure.   3. The mitral valve is normal in structure. Mild mitral valve  regurgitation. No evidence of mitral stenosis.   4. The aortic valve is tricuspid. Aortic valve regurgitation is not  visualized. No aortic stenosis is present.   5. The inferior vena cava is normal in size with greater than 50%  respiratory variability, suggesting right atrial pressure of 3 mmHg.   Zio 11/01/2021 Impression: Brief supraventricular tachycardia detected (78 episodes in 5 days; longest duration 5.2 seconds). Appear to be atrial tachycardia.  Occasional PACs (3.5% burden). No atrial fibrillation.   Recent Labs: 08/24/2021: ALT 15; BUN 11; Creatinine, Ser 0.84; Hemoglobin 16.3; Magnesium 2.0; Platelets 276; Potassium 3.6; Sodium 141; TSH 1.526   Recent Lipid Panel No results found for: "CHOL", "TRIG", "HDL", "CHOLHDL", "VLDL", "LDLCALC", "LDLDIRECT"  Physical Exam:   VS:  BP (!) 160/100   Pulse (!) 56   Ht 5\' 3"  (1.6 m)   Wt 167 lb 3.2 oz (75.8 kg)   SpO2 94%   BMI 29.62 kg/m    Wt Readings from Last 3 Encounters:  01/16/22 167 lb 3.2 oz (75.8 kg)  12/17/21 162 lb (73.5 kg)  09/23/21 162 lb 12.8 oz (73.8 kg)    General: Well nourished, well developed, in no acute distress Head: Atraumatic, normal size  Eyes: PEERLA, EOMI  Neck: Supple, no JVD Endocrine: No thryomegaly Cardiac: Normal S1, S2; RRR; no murmurs, rubs, or gallops Lungs: Clear to auscultation bilaterally, no wheezing, rhonchi or rales  Abd: Soft, nontender, no hepatomegaly  Ext: No edema, pulses 2+ Musculoskeletal: No deformities, BUE and BLE strength normal and equal Skin: Warm and dry, no rashes   Neuro: Alert and oriented to person, place, time, and situation, CNII-XII grossly intact, no focal  deficits  Psych: Normal mood and affect   ASSESSMENT:   Anita Fletcher is a 76 y.o. female who presents for the following: 1. Paroxysmal atrial fibrillation (HCC)   2. Acquired thrombophilia (HCC)   3. Primary hypertension   4. Abnormal nuclear stress test     PLAN:   1. Paroxysmal atrial fibrillation (HCC) 2. Acquired thrombophilia (HCC) -No recurrence of A-fib.  Continue metoprolol.  On Eliquis 5 mg twice daily.  No bleeding issues.  She will continue this.  3. Primary hypertension -Blood pressure elevated.  Start losartan 50 mg daily.  Check her blood pressure at least once a day.  I would like for her to come back to the office to see an APP in 3 months for blood pressure check.  4. Abnormal nuclear stress test -Mild anterior defect.  No chest pain.  Suspect this is artifact.  We will  proceed with coronary CTA for clarification.  She will give [place] a BMP today.  25 mg of metoprolol is taken twice daily.  Her heart rate is in the 50s.  She will continue this for her scan.  Disposition: Return in about 3 months (around 04/18/2022).  Medication Adjustments/Labs and Tests Ordered: Current medicines are reviewed at length with the patient today.  Concerns regarding medicines are outlined above.  Orders Placed This Encounter  Procedures   CT CORONARY MORPH W/CTA COR W/SCORE W/CA W/CM &/OR WO/CM   Basic metabolic panel   Meds ordered this encounter  Medications   losartan (COZAAR) 50 MG tablet    Sig: Take 1 tablet (50 mg total) by mouth daily.    Dispense:  90 tablet    Refill:  3    Patient Instructions  Medication Instructions:  START Losartan 50 mg daily   *If you need a refill on your cardiac medications before your next appointment, please call your pharmacy*   Lab Work: BMET today   If you have labs (blood work) drawn today and your tests are completely normal, you will receive your results only by: MyChart Message (if you have MyChart) OR A paper copy in the  mail If you have any lab test that is abnormal or we need to change your treatment, we will call you to review the results.   Testing/Procedures: Your physician has requested that you have cardiac CT. Cardiac computed tomography (CT) is a painless test that uses an x-ray machine to take clear, detailed pictures of your heart. For further information please visit [email or web address] Please follow instruction sheet as given.   Follow-Up: At [facility], you and your health needs are our priority.  As part of our continuing mission to provide you with exceptional heart care, we have created designated Provider Care Teams.  These Care Teams include your primary Cardiologist (physician) and Advanced Practice Providers (APPs -  Physician Assistants and Nurse Practitioners) who all work together to provide you with the care you need, when you need it.  We recommend signing up for the patient portal called "MyChart".  Sign up information is provided on this After Visit Summary.  MyChart is used to connect with patients for Virtual Visits (Telemedicine).  Patients are able to view lab/test results, encounter notes, upcoming appointments, etc.  Non-urgent messages can be sent to your provider as well.   To learn more about what you can do with MyChart, go to [email or web address].    Your next appointment:   3 month(s)  The format for your next appointment:   In Person  Provider:   Marjie Skiff, PA-C or Azalee Course, PA-C    Then, Lennie Odor, MD will plan to see you again in 12 month(s).  Other Instructions   Your cardiac CT will be scheduled at one of the below locations:   [facility] [address] [place], [place] [address] [phone]   If scheduled at [facility], please arrive at the [facility] and Children's Entrance (Entrance C2) of [facility] 30 minutes prior to test start time. You can use the FREE valet parking offered at entrance C  (encouraged to control the heart rate for the test)  Proceed to the [facility] Radiology Department (first floor) to check-in and test prep.  All radiology patients and guests should use entrance C2 at [facility], accessed from [facility], even though the hospital's physical address listed is [address]  [employer].      Please follow these instructions carefully (unless otherwise directed):   On the Night Before the Test: Be sure to Drink plenty of water. Do not consume any caffeinated/decaffeinated beverages or chocolate 12 hours prior to your test. Do not take any antihistamines 12 hours prior to your test.  On the Day of the Test: Drink plenty of water until 1 hour prior to the test. Do not eat any food 1 hour prior to test. You may take your regular medications prior to the test.  HOLD Furosemide/Hydrochlorothiazide morning of the test. FEMALES- please wear underwire-free bra if available, avoid dresses & tight clothing      After the Test: Drink plenty of water. After receiving IV contrast, you may experience a mild flushed feeling. This is normal. On occasion, you may experience a mild rash up to 24 hours after the test. This is not dangerous. If this occurs, you can take Benadryl 25 mg and increase your fluid intake. If you experience trouble breathing, this can be serious. If it is severe call 911 IMMEDIATELY. If it is mild, please call our office. If you take any of these medications: Glipizide/Metformin, Avandament, Glucavance, please do not take 48 hours after completing test unless otherwise instructed.  We will call to schedule your test 2-4 weeks out understanding that some insurance companies will need an authorization prior to the service being performed.   For non-scheduling related questions, please contact the cardiac imaging nurse navigator should you have any questions/concerns: Marchia Bond, Cardiac Imaging Nurse Navigator Gordy Clement, Cardiac Imaging Nurse Navigator [facility] Direct Office Dial: [phone]   For scheduling needs, including cancellations and rescheduling, please call [place], [phone].         Time Spent with Patient: I have spent a total of 35 minutes with patient reviewing hospital notes, telemetry, EKGs, labs and examining the patient as well as establishing an assessment and plan that was discussed with the patient.  > 50% of time was spent in direct patient care.  Signed, Addison Naegeli. Audie Box, MD, [facility]  [address], [place], [place] [address] [phone]  01/16/2022 2:06 PM

## 2022-01-16 ENCOUNTER — Ambulatory Visit: Payer: Medicare HMO | Attending: Cardiovascular Disease | Admitting: Cardiovascular Disease

## 2022-01-16 ENCOUNTER — Other Ambulatory Visit: Payer: Self-pay

## 2022-01-16 ENCOUNTER — Encounter: Payer: Self-pay | Admitting: Cardiovascular Disease

## 2022-01-16 VITALS — BP 160/100 | HR 56 | Ht 63.0 in | Wt 167.2 lb

## 2022-01-16 DIAGNOSIS — R9439 Abnormal result of other cardiovascular function study: Secondary | ICD-10-CM

## 2022-01-16 DIAGNOSIS — D6869 Other thrombophilia: Secondary | ICD-10-CM | POA: Diagnosis not present

## 2022-01-16 DIAGNOSIS — I480 Paroxysmal atrial fibrillation: Secondary | ICD-10-CM

## 2022-01-16 DIAGNOSIS — I10 Essential (primary) hypertension: Secondary | ICD-10-CM | POA: Diagnosis not present

## 2022-01-16 MED ORDER — LOSARTAN POTASSIUM 50 MG PO TABS: 50.0000 mg | ORAL_TABLET | Freq: Every day | ORAL | 3 refills | Status: DC

## 2022-01-16 NOTE — Patient Instructions (Addendum)
Medication Instructions:  START Losartan 50 mg daily   *If you need a refill on your cardiac medications before your next appointment, please call your pharmacy*   Lab Work: BMET today   If you have labs (blood work) drawn today and your tests are completely normal, you will receive your results only by: MyChart Message (if you have MyChart) OR A paper copy in the mail If you have any lab test that is abnormal or we need to change your treatment, we will call you to review the results.   Testing/Procedures: Your physician has requested that you have cardiac CT. Cardiac computed tomography (CT) is a painless test that uses an x-ray machine to take clear, detailed pictures of your heart. For further information please visit [email or web address] Please follow instruction sheet as given.   Follow-Up: At [facility], you and your health needs are our priority.  As part of our continuing mission to provide you with exceptional heart care, we have created designated Provider Care Teams.  These Care Teams include your primary Cardiologist (physician) and Advanced Practice Providers (APPs -  Physician Assistants and Nurse Practitioners) who all work together to provide you with the care you need, when you need it.  We recommend signing up for the patient portal called "MyChart".  Sign up information is provided on this After Visit Summary.  MyChart is used to connect with patients for Virtual Visits (Telemedicine).  Patients are able to view lab/test results, encounter notes, upcoming appointments, etc.  Non-urgent messages can be sent to your provider as well.   To learn more about what you can do with MyChart, go to [email or web address].    Your next appointment:   3 month(s)  The format for your next appointment:   In Person  Provider:   Marjie Skiff, PA-C or Azalee Course, PA-C    Then, Lennie Odor, MD will plan to see you again in 12 month(s).  Other Instructions   Your  cardiac CT will be scheduled at one of the below locations:   [facility] [address] [place], [place] [address] [phone]   If scheduled at [facility], please arrive at the [facility] and Children's Entrance (Entrance C2) of [facility] 30 minutes prior to test start time. You can use the FREE valet parking offered at entrance C (encouraged to control the heart rate for the test)  Proceed to the [facility] Radiology Department (first floor) to check-in and test prep.  All radiology patients and guests should use entrance C2 at [facility], accessed from [facility], even though the hospital's physical address listed is [address].      Please follow these instructions carefully (unless otherwise directed):   On the Night Before the Test: Be sure to Drink plenty of water. Do not consume any caffeinated/decaffeinated beverages or chocolate 12 hours prior to your test. Do not take any antihistamines 12 hours prior to your test.  On the Day of the Test: Drink plenty of water until 1 hour prior to the test. Do not eat any food 1 hour prior to test. You may take your regular medications prior to the test.  HOLD Furosemide/Hydrochlorothiazide morning of the test. FEMALES- please wear underwire-free bra if available, avoid dresses & tight clothing      After the Test: Drink plenty of water. After receiving IV contrast, you may experience a mild flushed feeling. This is normal. On occasion, you may experience  a mild rash up to 24 hours after the test. This is not dangerous. If this occurs, you can take Benadryl 25 mg and increase your fluid intake. If you experience trouble breathing, this can be serious. If it is severe call 911 IMMEDIATELY. If it is mild, please call our office. If you take any of these medications: Glipizide/Metformin, Avandament, Glucavance, please do not take 48 hours after completing test unless  otherwise instructed.  We will call to schedule your test 2-4 weeks out understanding that some insurance companies will need an authorization prior to the service being performed.   For non-scheduling related questions, please contact the cardiac imaging nurse navigator should you have any questions/concerns: Marchia Bond, Cardiac Imaging Nurse Navigator Gordy Clement, Cardiac Imaging Nurse Navigator [facility] Direct Office Dial: [phone]   For scheduling needs, including cancellations and rescheduling, please call [place], [phone].

## 2022-01-26 DIAGNOSIS — I10 Essential (primary) hypertension: Secondary | ICD-10-CM | POA: Diagnosis not present

## 2022-01-26 DIAGNOSIS — R531 Weakness: Secondary | ICD-10-CM | POA: Diagnosis not present

## 2022-01-26 DIAGNOSIS — G459 Transient cerebral ischemic attack, unspecified: Secondary | ICD-10-CM | POA: Diagnosis not present

## 2022-01-26 DIAGNOSIS — R4781 Slurred speech: Secondary | ICD-10-CM | POA: Diagnosis not present

## 2022-01-27 ENCOUNTER — Emergency Department (HOSPITAL_COMMUNITY): Payer: Medicare HMO

## 2022-01-27 ENCOUNTER — Other Ambulatory Visit: Payer: Self-pay

## 2022-01-27 ENCOUNTER — Telehealth: Payer: Self-pay | Admitting: Cardiovascular Disease

## 2022-01-27 ENCOUNTER — Inpatient Hospital Stay (HOSPITAL_COMMUNITY): Payer: Medicare HMO

## 2022-01-27 ENCOUNTER — Inpatient Hospital Stay (HOSPITAL_COMMUNITY)
Admission: EM | Admit: 2022-01-27 | Discharge: 2022-02-04 | DRG: 024 | Disposition: A | Discharge status: Rehabilitation Facility | Payer: Medicare HMO | Attending: Internal Medicine | Admitting: Internal Medicine

## 2022-01-27 ENCOUNTER — Encounter (HOSPITAL_COMMUNITY): Payer: Self-pay

## 2022-01-27 DIAGNOSIS — F411 Generalized anxiety disorder: Secondary | ICD-10-CM | POA: Diagnosis not present

## 2022-01-27 DIAGNOSIS — G4700 Insomnia, unspecified: Secondary | ICD-10-CM | POA: Diagnosis not present

## 2022-01-27 DIAGNOSIS — R0689 Other abnormalities of breathing: Secondary | ICD-10-CM | POA: Diagnosis not present

## 2022-01-27 DIAGNOSIS — Z811 Family history of alcohol abuse and dependence: Secondary | ICD-10-CM

## 2022-01-27 DIAGNOSIS — I63542 Cerebral infarction due to unspecified occlusion or stenosis of left cerebellar artery: Secondary | ICD-10-CM | POA: Diagnosis not present

## 2022-01-27 DIAGNOSIS — Z833 Family history of diabetes mellitus: Secondary | ICD-10-CM | POA: Diagnosis not present

## 2022-01-27 DIAGNOSIS — I6381 Other cerebral infarction due to occlusion or stenosis of small artery: Secondary | ICD-10-CM | POA: Diagnosis not present

## 2022-01-27 DIAGNOSIS — R200 Anesthesia of skin: Secondary | ICD-10-CM | POA: Diagnosis not present

## 2022-01-27 DIAGNOSIS — I10 Essential (primary) hypertension: Secondary | ICD-10-CM | POA: Diagnosis present

## 2022-01-27 DIAGNOSIS — R29810 Facial weakness: Secondary | ICD-10-CM | POA: Diagnosis present

## 2022-01-27 DIAGNOSIS — E785 Hyperlipidemia, unspecified: Secondary | ICD-10-CM | POA: Diagnosis present

## 2022-01-27 DIAGNOSIS — Z7902 Long term (current) use of antithrombotics/antiplatelets: Secondary | ICD-10-CM

## 2022-01-27 DIAGNOSIS — M549 Dorsalgia, unspecified: Secondary | ICD-10-CM | POA: Diagnosis present

## 2022-01-27 DIAGNOSIS — K5900 Constipation, unspecified: Secondary | ICD-10-CM | POA: Diagnosis present

## 2022-01-27 DIAGNOSIS — R0902 Hypoxemia: Secondary | ICD-10-CM | POA: Diagnosis not present

## 2022-01-27 DIAGNOSIS — I69392 Facial weakness following cerebral infarction: Secondary | ICD-10-CM | POA: Diagnosis not present

## 2022-01-27 DIAGNOSIS — F410 Panic disorder [episodic paroxysmal anxiety] without agoraphobia: Secondary | ICD-10-CM | POA: Diagnosis present

## 2022-01-27 DIAGNOSIS — I651 Occlusion and stenosis of basilar artery: Secondary | ICD-10-CM | POA: Diagnosis present

## 2022-01-27 DIAGNOSIS — Z7901 Long term (current) use of anticoagulants: Secondary | ICD-10-CM | POA: Diagnosis not present

## 2022-01-27 DIAGNOSIS — R030 Elevated blood-pressure reading, without diagnosis of hypertension: Secondary | ICD-10-CM | POA: Diagnosis present

## 2022-01-27 DIAGNOSIS — F40240 Claustrophobia: Secondary | ICD-10-CM | POA: Diagnosis not present

## 2022-01-27 DIAGNOSIS — Z8673 Personal history of transient ischemic attack (TIA), and cerebral infarction without residual deficits: Secondary | ICD-10-CM

## 2022-01-27 DIAGNOSIS — I63512 Cerebral infarction due to unspecified occlusion or stenosis of left middle cerebral artery: Principal | ICD-10-CM | POA: Diagnosis present

## 2022-01-27 DIAGNOSIS — I672 Cerebral atherosclerosis: Secondary | ICD-10-CM | POA: Diagnosis not present

## 2022-01-27 DIAGNOSIS — I69351 Hemiplegia and hemiparesis following cerebral infarction affecting right dominant side: Secondary | ICD-10-CM | POA: Diagnosis not present

## 2022-01-27 DIAGNOSIS — G8191 Hemiplegia, unspecified affecting right dominant side: Secondary | ICD-10-CM | POA: Diagnosis not present

## 2022-01-27 DIAGNOSIS — H5509 Other forms of nystagmus: Secondary | ICD-10-CM | POA: Diagnosis not present

## 2022-01-27 DIAGNOSIS — I6350 Cerebral infarction due to unspecified occlusion or stenosis of unspecified cerebral artery: Secondary | ICD-10-CM | POA: Diagnosis not present

## 2022-01-27 DIAGNOSIS — I6930 Unspecified sequelae of cerebral infarction: Secondary | ICD-10-CM | POA: Diagnosis present

## 2022-01-27 DIAGNOSIS — I6389 Other cerebral infarction: Secondary | ICD-10-CM | POA: Diagnosis not present

## 2022-01-27 DIAGNOSIS — R001 Bradycardia, unspecified: Secondary | ICD-10-CM | POA: Diagnosis not present

## 2022-01-27 DIAGNOSIS — Z79899 Other long term (current) drug therapy: Secondary | ICD-10-CM

## 2022-01-27 DIAGNOSIS — I639 Cerebral infarction, unspecified: Secondary | ICD-10-CM

## 2022-01-27 DIAGNOSIS — I6322 Cerebral infarction due to unspecified occlusion or stenosis of basilar arteries: Secondary | ICD-10-CM | POA: Diagnosis not present

## 2022-01-27 DIAGNOSIS — M792 Neuralgia and neuritis, unspecified: Secondary | ICD-10-CM | POA: Diagnosis not present

## 2022-01-27 DIAGNOSIS — I97821 Postprocedural cerebrovascular infarction during other surgery: Secondary | ICD-10-CM | POA: Diagnosis not present

## 2022-01-27 DIAGNOSIS — E876 Hypokalemia: Secondary | ICD-10-CM | POA: Diagnosis present

## 2022-01-27 DIAGNOSIS — I6522 Occlusion and stenosis of left carotid artery: Secondary | ICD-10-CM | POA: Diagnosis not present

## 2022-01-27 DIAGNOSIS — Z7982 Long term (current) use of aspirin: Secondary | ICD-10-CM

## 2022-01-27 DIAGNOSIS — Y838 Other surgical procedures as the cause of abnormal reaction of the patient, or of later complication, without mention of misadventure at the time of the procedure: Secondary | ICD-10-CM | POA: Diagnosis not present

## 2022-01-27 DIAGNOSIS — J9811 Atelectasis: Secondary | ICD-10-CM | POA: Diagnosis not present

## 2022-01-27 DIAGNOSIS — K449 Diaphragmatic hernia without obstruction or gangrene: Secondary | ICD-10-CM | POA: Diagnosis not present

## 2022-01-27 DIAGNOSIS — I6501 Occlusion and stenosis of right vertebral artery: Secondary | ICD-10-CM | POA: Diagnosis not present

## 2022-01-27 DIAGNOSIS — I480 Paroxysmal atrial fibrillation: Secondary | ICD-10-CM | POA: Diagnosis present

## 2022-01-27 DIAGNOSIS — Z818 Family history of other mental and behavioral disorders: Secondary | ICD-10-CM

## 2022-01-27 DIAGNOSIS — R201 Hypoesthesia of skin: Secondary | ICD-10-CM | POA: Diagnosis present

## 2022-01-27 DIAGNOSIS — J302 Other seasonal allergic rhinitis: Secondary | ICD-10-CM | POA: Diagnosis not present

## 2022-01-27 DIAGNOSIS — K5901 Slow transit constipation: Secondary | ICD-10-CM | POA: Diagnosis not present

## 2022-01-27 DIAGNOSIS — I6523 Occlusion and stenosis of bilateral carotid arteries: Secondary | ICD-10-CM | POA: Diagnosis not present

## 2022-01-27 DIAGNOSIS — H5500 Unspecified nystagmus: Secondary | ICD-10-CM | POA: Diagnosis present

## 2022-01-27 DIAGNOSIS — I4891 Unspecified atrial fibrillation: Secondary | ICD-10-CM | POA: Diagnosis not present

## 2022-01-27 HISTORY — DX: Essential (primary) hypertension: I10

## 2022-01-27 HISTORY — DX: Cerebral infarction, unspecified: I63.9

## 2022-01-27 LAB — CBC WITH DIFFERENTIAL/PLATELET
Abs Immature Granulocytes: 0.02 10*3/uL (ref 0.00–0.07)
Basophils Absolute: 0 10*3/uL (ref 0.0–0.1)
Basophils Relative: 1 %
Eosinophils Absolute: 0.2 10*3/uL (ref 0.0–0.5)
Eosinophils Relative: 2 %
HCT: 44.4 % (ref 36.0–46.0)
Hemoglobin: 14.9 g/dL (ref 12.0–15.0)
Immature Granulocytes: 0 %
Lymphocytes Relative: 26 %
Lymphs Abs: 1.8 10*3/uL (ref 0.7–4.0)
MCH: 31.1 pg (ref 26.0–34.0)
MCHC: 33.6 g/dL (ref 30.0–36.0)
MCV: 92.7 fL (ref 80.0–100.0)
Monocytes Absolute: 0.6 10*3/uL (ref 0.1–1.0)
Monocytes Relative: 9 %
Neutro Abs: 4.4 10*3/uL (ref 1.7–7.7)
Neutrophils Relative %: 62 %
Platelets: 253 10*3/uL (ref 150–400)
RBC: 4.79 MIL/uL (ref 3.87–5.11)
RDW: 12.8 % (ref 11.5–15.5)
WBC: 7.1 10*3/uL (ref 4.0–10.5)
nRBC: 0 % (ref 0.0–0.2)

## 2022-01-27 LAB — COMPREHENSIVE METABOLIC PANEL
ALT: 15 U/L (ref 0–44)
AST: 14 U/L — ABNORMAL LOW (ref 15–41)
Albumin: 3.8 g/dL (ref 3.5–5.0)
Alkaline Phosphatase: 56 U/L (ref 38–126)
Anion gap: 8 (ref 5–15)
BUN: 11 mg/dL (ref 8–23)
CO2: 26 mmol/L (ref 22–32)
Calcium: 9 mg/dL (ref 8.9–10.3)
Chloride: 107 mmol/L (ref 98–111)
Creatinine, Ser: 0.76 mg/dL (ref 0.44–1.00)
GFR, Estimated: >60 mL/min (ref >60)
Glucose, Bld: 82 mg/dL (ref 70–99)
Potassium: 3.6 mmol/L (ref 3.5–5.1)
Sodium: 141 mmol/L (ref 135–145)
Total Bilirubin: 0.5 mg/dL (ref 0.3–1.2)
Total Protein: 6.8 g/dL (ref 6.5–8.1)

## 2022-01-27 LAB — CBG MONITORING, ED: Glucose-Capillary: 77 mg/dL (ref 70–99)

## 2022-01-27 MED ORDER — ASPIRIN 81 MG PO CHEW
81.0000 mg | CHEWABLE_TABLET | Freq: Every day | ORAL | Status: DC
  Administered 2022-01-28 – 2022-02-01 (×4): 81 mg via ORAL
  Filled 2022-01-27 (×4): qty 1

## 2022-01-27 MED ORDER — ASPIRIN 325 MG PO TABS
325.0000 mg | ORAL_TABLET | Freq: Once | ORAL | Status: AC
  Administered 2022-01-27: 325 mg via ORAL
  Filled 2022-01-27: qty 1

## 2022-01-27 MED ORDER — ATORVASTATIN CALCIUM 40 MG PO TABS
40.0000 mg | ORAL_TABLET | Freq: Every day | ORAL | Status: DC
  Administered 2022-01-27 – 2022-02-04 (×8): 40 mg via ORAL
  Filled 2022-01-27 (×8): qty 1

## 2022-01-27 MED ORDER — CLOPIDOGREL BISULFATE 75 MG PO TABS
300.0000 mg | ORAL_TABLET | Freq: Once | ORAL | Status: AC
  Administered 2022-01-27: 300 mg via ORAL
  Filled 2022-01-27: qty 4

## 2022-01-27 MED ORDER — POTASSIUM CHLORIDE CRYS ER 20 MEQ PO TBCR
40.0000 meq | EXTENDED_RELEASE_TABLET | Freq: Once | ORAL | Status: AC
  Administered 2022-01-27: 40 meq via ORAL
  Filled 2022-01-27: qty 2

## 2022-01-27 MED ORDER — LORAZEPAM 2 MG/ML IJ SOLN: 0.5000 mg | Freq: Once | INTRAMUSCULAR | Status: DC

## 2022-01-27 MED ORDER — SENNOSIDES-DOCUSATE SODIUM 8.6-50 MG PO TABS: 1.0000 | ORAL_TABLET | Freq: Every evening | ORAL | Status: DC | PRN

## 2022-01-27 MED ORDER — IOHEXOL 300 MG/ML  SOLN
100.0000 mL | Freq: Once | INTRAMUSCULAR | Status: AC | PRN
  Administered 2022-01-27: 75 mL via INTRAVENOUS

## 2022-01-27 MED ORDER — HYDRALAZINE HCL 20 MG/ML IJ SOLN
10.0000 mg | INTRAMUSCULAR | Status: DC | PRN
  Administered 2022-01-27: 10 mg via INTRAVENOUS
  Filled 2022-01-27: qty 1

## 2022-01-27 MED ORDER — ENOXAPARIN SODIUM 40 MG/0.4ML IJ SOSY
40.0000 mg | PREFILLED_SYRINGE | INTRAMUSCULAR | Status: DC
  Administered 2022-01-27 – 2022-01-30 (×4): 40 mg via SUBCUTANEOUS
  Filled 2022-01-27 (×4): qty 0.4

## 2022-01-27 MED ORDER — LORAZEPAM 2 MG/ML IJ SOLN
1.0000 mg | Freq: Once | INTRAMUSCULAR | Status: AC
  Administered 2022-01-27: 1 mg via INTRAVENOUS
  Filled 2022-01-27: qty 1

## 2022-01-27 MED ORDER — ACETAMINOPHEN 325 MG PO TABS
650.0000 mg | ORAL_TABLET | ORAL | Status: DC | PRN
  Administered 2022-01-28 – 2022-02-04 (×11): 650 mg via ORAL
  Filled 2022-01-27 (×11): qty 2

## 2022-01-27 MED ORDER — STROKE: EARLY STAGES OF RECOVERY BOOK
Freq: Once | Status: DC
  Filled 2022-01-27 (×2): qty 1

## 2022-01-27 MED ORDER — ACETAMINOPHEN 160 MG/5ML PO SOLN: 650.0000 mg | ORAL | Status: DC | PRN

## 2022-01-27 MED ORDER — CLOPIDOGREL BISULFATE 75 MG PO TABS
75.0000 mg | ORAL_TABLET | Freq: Every day | ORAL | Status: DC
  Administered 2022-01-28 – 2022-01-31 (×3): 75 mg via ORAL
  Filled 2022-01-27 (×3): qty 1

## 2022-01-27 MED ORDER — ASPIRIN 81 MG PO CHEW: 81.0000 mg | CHEWABLE_TABLET | Freq: Every day | ORAL | Status: DC

## 2022-01-27 MED ORDER — ACETAMINOPHEN 650 MG RE SUPP: 650.0000 mg | RECTAL | Status: DC | PRN

## 2022-01-27 NOTE — Assessment & Plan Note (Signed)
Currently in sinus rhythm.  Reports compliance with Eliquis -Hold Eliquis for now pending neurology eval -Plavix and aspirin given in ED

## 2022-01-27 NOTE — Telephone Encounter (Signed)
The patient called in with complaints of right sided numbness. She stated that this started last night. The numbness goes from her head to her toes. She stated that she in unable to ambulate and has almost fallen due to the numbness. She denies facial drooping and was able to speak clearly on the phone.  She has been advised to call EMS for further assessment. She verbalized her agreement.

## 2022-01-27 NOTE — ED Provider Notes (Signed)
[facility] EMERGENCY DEPARTMENT Provider Note   CSN: [id number] Arrival date & time: 01/27/22  1229     History  Chief Complaint  Patient presents with   Numbness    Anita Fletcher is a 76 y.o. female.  HPI Patient is a 77 year old female with a past medical history significant for TIA and hypertension  She presents emergency room today with complaints of episodic right face, right upper extremity and right lower extremity numbness that seems to be occurring and resolving intermittently since 4:30 PM yesterday.  She states that she has not had the symptoms at rest but also does not have the symptoms every time she exerts herself.  She denies any headache or neck pain.  She denies any chest pain or difficulty breathing.  She denies any blurry vision double vision or visual field cuts.  She denies any slurred speech or weakness in any extremity and states that she has been walking without difficulty.  She has a history of TIA but no history of stroke.  History of A-fib on Eliquis       Home Medications Prior to Admission medications   Medication Sig Start Date End Date Taking? Authorizing Provider  apixaban (ELIQUIS) 5 MG TABS tablet Take 1 tablet (5 mg total) by mouth 2 (two) times daily. 09/23/21  Yes O'Neal, Ronnald Ramp, MD  losartan (COZAAR) 50 MG tablet Take 1 tablet (50 mg total) by mouth daily. 01/16/22 01/11/23 Yes O'Neal, Ronnald Ramp, MD  metoprolol tartrate (LOPRESSOR) 25 MG tablet Take 1 tablet (25 mg total) by mouth 2 (two) times daily. 09/24/21  Yes Rollene Rotunda, MD      Allergies    Codeine    Review of Systems   Review of Systems  Physical Exam Updated Vital Signs BP (!) 222/114   Pulse (!) 59   Temp 98.2 F (36.8 C)   Resp 17   Ht 5' 3.5" (1.613 m)   Wt 76.2 kg   SpO2 93%   BMI 29.29 kg/m  Physical Exam Vitals and nursing note reviewed.  Constitutional:      General: She is not in acute distress. HENT:     Head: Normocephalic and  atraumatic.     Nose: Nose normal.     Mouth/Throat:     Mouth: Mucous membranes are moist.  Eyes:     General: No scleral icterus. Cardiovascular:     Rate and Rhythm: Normal rate and regular rhythm.     Pulses: Normal pulses.     Heart sounds: Normal heart sounds.  Pulmonary:     Effort: Pulmonary effort is normal. No respiratory distress.     Breath sounds: No wheezing.  Abdominal:     Palpations: Abdomen is soft.     Tenderness: There is no abdominal tenderness.  Musculoskeletal:     Cervical back: Normal range of motion.     Right lower leg: No edema.     Left lower leg: No edema.  Skin:    General: Skin is warm and dry.     Capillary Refill: Capillary refill takes less than 2 seconds.  Neurological:     Mental Status: She is alert. Mental status is at baseline.     Comments: Alert and oriented to self, place, time and event.   Speech is fluent, clear without dysarthria or dysphasia.   Strength 5/5 in upper/lower extremities   Sensation intact in upper/lower extremities   Normal gait. CN I not tested  CN II grossly  intact visual fields bilaterally. Did not visualize posterior eye.  CN III, IV, VI PERRLA and EOMs intact bilaterally  CN V Intact sensation to sharp and light touch to the face  CN VII facial movements symmetric  CN VIII not tested  CN IX, X no uvula deviation, symmetric rise of soft palate  CN XI 5/5 SCM and trapezius strength bilaterally  CN XII Midline tongue protrusion, symmetric L/R movements   Psychiatric:        Mood and Affect: Mood normal.        Behavior: Behavior normal.     ED Results / Procedures / Treatments   Labs (all labs ordered are listed, but only abnormal results are displayed) Labs Reviewed  COMPREHENSIVE METABOLIC PANEL - Abnormal; Notable for the following components:      Result Value   AST 14 (*)    All other components within normal limits  CBC WITH DIFFERENTIAL/PLATELET  CBG MONITORING, ED     EKG None  Radiology CT ANGIO HEAD NECK W WO CM  Result Date: 01/27/2022 CLINICAL DATA:  Stroke code right facial numbness/tingling EXAM: CT ANGIOGRAPHY HEAD AND NECK TECHNIQUE: Multidetector CT imaging of the head and neck was performed using the standard protocol during bolus administration of intravenous contrast. Multiplanar CT image reconstructions and MIPs were obtained to evaluate the vascular anatomy. Carotid stenosis measurements (when applicable) are obtained utilizing NASCET criteria, using the distal internal carotid diameter as the denominator. RADIATION DOSE REDUCTION: This exam was performed according to the departmental dose-optimization program which includes automated exposure control, adjustment of the mA and/or kV according to patient size and/or use of iterative reconstruction technique. CONTRAST:  75mL OMNIPAQUE IOHEXOL 300 MG/ML  SOLN COMPARISON:  Same-day CT brain FINDINGS: CT HEAD FINDINGS Brain: See same day brain. 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 Vascular: See below Skull: Normal. Negative for fracture or focal lesion. Sinuses/Orbits: Bilateral lens replacements. Other: None Review of the MIP images confirms the above findings CTA NECK FINDINGS Aortic arch: Right carotid system: Right common carotid and cervical ICAs are markedly tortuous. There is mild narrowing of the cavernous segment of the right ICA. Left carotid system: Left common carotid and cervical ICAs are markedly tortuous. There is mild narrowing of the left common carotid artery (series 6, image 229) secondary to soft atherosclerotic plaque. There is also mild narrowing of the origin of the left ICA secondary to soft atherosclerotic plaque. There is mild narrowing of the cavernous segment of the left ICA. Vertebral arteries: There is mild-to-moderate narrowing of the origin of the right vertebral artery. There is moderate to severe narrowing in the distal V4 segment of  the right vertebral artery (series 6, image 141). Skeleton: Multilevel degenerative changes with severe neural foraminal narrowing in the upper and mid cervical spine. Other neck: Negative. Upper chest: Negative. Review of the MIP images confirms the above findings CTA HEAD FINDINGS Anterior circulation: There is moderate focal stenosis at the M1 segment of the right ICA (series 7, image 92). There is mild-to-moderate narrowing of the A1 segment of the left ACA (series 6, image 103). 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 The P2 segment of the right PCA is predominantly supplied by a small caliber PCOM which likely demonstrates at least mild-to-moderate stenosis at the distal portion (series 6, image 109). There is severe stenosis in the P2 segment of the  left PCA (series 100, image 112). Venous sinuses: As permitted by contrast timing, patent. 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 No evidence of large vessel occlusion. Electronically Signed   By: Lorenza Cambridge M.D.   On: 01/27/2022 16:15   CT Head Wo Contrast  Result Date: 01/27/2022 CLINICAL DATA:  Right facial numbness beginning yesterday EXAM: CT HEAD WITHOUT CONTRAST TECHNIQUE: Contiguous axial images were obtained from the base of the skull through the vertex without intravenous contrast. RADIATION DOSE REDUCTION: This exam was performed according to the departmental dose-optimization program which includes automated exposure control, adjustment of the mA and/or kV according to patient size and/or use of iterative reconstruction technique. COMPARISON:  CT  head 07/05/2013 FINDINGS: Brain: There is no acute intracranial hemorrhage, extra-axial fluid collection, or acute large vessel territorial infarct. There is patchy hypodensity in the lentiform nuclei bilaterally, left more than right which could reflect acute or subacute infarct. Gray-white differentiation is otherwise preserved. Background parenchymal volume is normal. Gray-white differentiation is preserved. Ventricles are normal in size. Additional mild hypodensity in the supratentorial white matter likely reflects sequela of underlying chronic small vessel ischemic change There is no mass lesion.  There is no mass effect or midline shift. Vascular: There is calcification of the bilateral carotid siphons and vertebral arteries. Skull: Normal. Negative for fracture or focal lesion. Sinuses/Orbits: The imaged paranasal sinuses are clear. A right lens implant is noted. The globes and orbits are otherwise unremarkable. Other: None. IMPRESSION: 1. Patchy hypodensity in the lentiform nuclei bilaterally could reflect acute or subacute infarcts. Consider brain MRI for further evaluation. 2. No acute intracranial hemorrhage or large vessel territorial infarct. Electronically Signed   By: Lesia Hausen M.D.   On: 01/27/2022 14:25    Procedures Procedures    Medications Ordered in ED Medications  atorvastatin (LIPITOR) tablet 40 mg (has no administration in time range)  aspirin chewable tablet 81 mg (has no administration in time range)  clopidogrel (PLAVIX) tablet 75 mg (has no administration in time range)  aspirin tablet 325 mg (325 mg Oral Given 01/27/22 1613)  clopidogrel (PLAVIX) tablet 300 mg (300 mg Oral Given 01/27/22 1613)  iohexol (OMNIPAQUE) 300 MG/ML solution 100 mL (75 mLs Intravenous Contrast Given 01/27/22 1550)  potassium chloride SA (KLOR-CON M) CR tablet 40 mEq (40 mEq Oral Given 01/27/22 1802)    ED Course/ Medical Decision Making/ A&P Clinical Course as of 01/27/22 1813  Mon Jan 27, 2022  1512 [address] 300 plavix  [WF]    Clinical Course User Index [WF] Gailen Shelter, [place]                           Medical Decision Making Amount and/or Complexity of Data Reviewed Labs: ordered. Radiology: ordered.  Risk OTC drugs. Prescription drug management. Decision regarding hospitalization.   This patient presents to the ED for concern of right hemibody numbness episodically, this involves a number of treatment options, and is a complaint that carries with it a mild to high risk of complications and morbidity. A differential diagnosis was considered for the patient's symptoms which is discussed below:   MS, stroke, cerebral vascular disease   Co morbidities: Discussed in  HPI   Brief History:  Patient is a 76 year old female with a past medical history significant for TIA and hypertension  She presents emergency room today with complaints of episodic right face, right upper extremity and right lower extremity numbness that seems to be occurring and resolving intermittently since 4:30 PM yesterday.  She states that she has not had the symptoms at rest but also does not have the symptoms every time she exerts herself.  She denies any headache or neck pain.  She denies any chest pain or difficulty breathing.  She denies any blurry vision double vision or visual field cuts.  She denies any slurred speech or weakness in any extremity and states that she has been walking without difficulty.  She has a history of TIA but no history of stroke.  History of A-fib on Eliquis    EMR reviewed including pt PMHx, past surgical history and past visits to ER.   See HPI for more details   Lab Tests:   I personally reviewed all laboratory work and imaging. Metabolic panel without any acute abnormality specifically kidney function within normal limits and no significant electrolyte abnormalities. CBC without leukocytosis or significant anemia.  CBG within normal  limits.   Imaging Studies:  Abnormal findings. I personally reviewed all imaging studies. 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 No evidence of large vessel  occlusion.    Cardiac Monitoring:  The patient was maintained on a cardiac monitor.  I personally viewed and interpreted the cardiac monitored which showed an underlying rhythm of: NSR EKG non-ischemic   Medicines ordered:  I ordered medication including p.o. potassium, aspirin, Plavix for atherosclerotic disease, hypokalemia Reevaluation of the patient after these medicines showed that the patient stayed the same I have reviewed the patients home medicines and have made adjustments as needed   Critical Interventions:     Consults/Attending Physician   I requested consultation with Dr. Amada Jupiter of neurology,  and discussed lab and imaging findings as well as pertinent plan - they recommend: admission to [facility] - Dr. Mariea Clonts will facilitate this appreciate their care of patient.   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  This was seen on CTA. Transfer to [facility] and aspirin Plavix. Neurology recommends aspirin, Plavix, blood pressure elevated here will allow permissive hypertension up to 220/120 (per neurology).   Dispostion:  After consideration of the diagnostic results and the patients response to treatment, I feel that the patent would benefit from admission to [facility]   Final Clinical  Impression(s) / ED Diagnoses Final diagnoses:  Basilar artery stenosis    Rx / DC Orders ED Discharge Orders     None         Gailen Shelter, [place] 01/27/22 1832    Gloris Manchester, MD 01/28/22 2109

## 2022-01-27 NOTE — H&P (Addendum)
History and Physical    Anita Fletcher CLE:[id number] DOB: [birth year] DOA: 01/27/2022  PCP: Patient, No Pcp Per   Patient coming from: Home  I have personally briefly reviewed patient's old medical records in [facility]  Chief Complaint: Numbness  HPI: Anita Fletcher is a 76 y.o. female with medical history significant for TIA, hypertension, atrial fibrillation. Patient presented to the ED with complaints of numbness to her right face, right hand and right lower extremity this is about 4.30 p.m. yesterday.  She reports symptoms are present mostly when she is standing, when she is lying down she does not have any symptoms.  She denies facial asymmetry, no change in speech no change in vision no weakness of her extremities.  She is compliant with her Eliquis. She reports that about 10 years ago, she was diagnosed with a TIA, then she had tingling involving the right side of her face and extremities that lasted about 15 minutes and resolved, symptoms were also mild then.  ED Course: Blood pressure systolic 160s to 944.  Heart rate 50s. Head CT shows patchy hypodensity in lentiform nuclei bilaterally-acute to subacute infarcts MRI brain recommended.   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 EDP talked to Dr. Amada Jupiter- recommended admission to [facility], neurology will see in consult.  Review of Systems: As per HPI all other systems reviewed and negative.  Past Medical History:  Diagnosis Date   Acute CVA (cerebrovascular accident) (HCC) 01/27/2022   TIA (transient ischemic attack)     Past Surgical History:  Procedure Laterality Date   EYE SURGERY       reports that she has never smoked. She does not have any smokeless tobacco history on file. She reports that she does not drink alcohol and does not use drugs.  Allergies   Allergen Reactions   Codeine     Family History  Problem Relation Age of Onset   Arrhythmia Brother     Prior to Admission medications   Medication Sig Start Date End Date Taking? Authorizing Provider  apixaban (ELIQUIS) 5 MG TABS tablet Take 1 tablet (5 mg total) by mouth 2 (two) times daily. 09/23/21  Yes O'Neal, Ronnald Ramp, MD  losartan (COZAAR) 50 MG tablet Take 1 tablet (50 mg total) by mouth daily. 01/16/22 01/11/23 Yes O'Neal, Ronnald Ramp, MD  metoprolol tartrate (LOPRESSOR) 25 MG tablet Take 1 tablet (25 mg total) by mouth 2 (two) times daily. 09/24/21  Yes Rollene Rotunda, MD    Physical Exam: Vitals:   01/27/22 1430 01/27/22 1500 01/27/22 1530 01/27/22 1600  BP: (!) 167/88 (!) 184/66 (!) 206/99 (!) 197/93  Pulse: (!) 57 (!) 55 (!) 59 (!) 58  Resp: 17 17 14  (!) 21  Temp:      TempSrc:      SpO2: 97% 98% 97% 96%  Weight:      Height:        Constitutional: NAD, calm, comfortable Vitals:   01/27/22 1430 01/27/22 1500 01/27/22 1530 01/27/22 1600  BP: (!) 167/88 (!) 184/66 (!) 206/99 (!) 197/93  Pulse: (!) 57 (!) 55 (!) 59 (!) 58  Resp: 17 17 14  (!) 21  Temp:      TempSrc:      SpO2: 97% 98% 97% 96%  Weight:      Height:       Eyes: PERRL, lids and conjunctivae  normal ENMT: Mucous membranes are moist.  Neck: normal, supple, no masses, no thyromegaly Respiratory: clear to auscultation bilaterally, no wheezing, no crackles. Normal respiratory effort. No accessory muscle use.  Cardiovascular: Regular rate and rhythm, no murmurs / rubs / gallops.  Trace bilateral pitting bilateral lower extremity edema chronic and unchanged.  Extremities warm  Abdomen: no tenderness, no masses palpated. No hepatosplenomegaly. Bowel sounds positive.  Musculoskeletal: no clubbing / cyanosis. No joint deformity upper and lower extremities.  Skin: no rashes, lesions, ulcers. No induration Neurologic:  Neurological:     Mental Status: She is alert.     GCS: GCS eye subscore is 4.  GCS verbal subscore is 5. GCS motor subscore is 6.     Comments: Mental Status:  Alert, oriented, thought content appropriate, able to give a coherent history. Speech fluent without evidence of aphasia. Able to follow 2 step commands without difficulty.  Cranial Nerves:  II:  Peripheral visual fields grossly normal, pupils equal, round, reactive to light III,IV, VI: ptosis not present, extra-ocular motions intact bilaterally  V,VII: slight flattening of right nasolabial fold that is not as apparent when smiling,, eyebrows raise symmetric, facial light touch sensation equal VIII: hearing grossly normal to voice  Motor:  Normal tone.  5 of 5 strength bilateral upper and lower extremities. Sensory: Sensation intact to light touch in all extremities.  Cerebellar: normal finger-to-nose with bilateral upper extremities.  CV: distal pulses palpable throughout  .  Psychiatric: Normal judgment and insight. Alert and oriented x 3. Normal mood.   Labs on Admission: I have personally reviewed following labs and imaging studies  CBC: Recent Labs  Lab 01/27/22 1352  WBC 7.1  NEUTROABS 4.4  HGB 14.9  HCT 44.4  MCV 92.7  PLT 253   Basic Metabolic Panel: Recent Labs  Lab 01/27/22 1352  NA 141  K 3.6  CL 107  CO2 26  GLUCOSE 82  BUN 11  CREATININE 0.76  CALCIUM 9.0   GFR: Estimated Creatinine Clearance: 61 mL/min (by C-G formula based on SCr of 0.76 mg/dL). Liver Function Tests: Recent Labs  Lab 01/27/22 1352  AST 14*  ALT 15  ALKPHOS 56  BILITOT 0.5  PROT 6.8  ALBUMIN 3.8   CBG: Recent Labs  Lab 01/27/22 1354  GLUCAP 77    Radiological Exams on Admission: CT ANGIO HEAD NECK W WO CM  Result Date: 01/27/2022 CLINICAL DATA:  Stroke code right facial numbness/tingling EXAM: CT ANGIOGRAPHY HEAD AND NECK TECHNIQUE: Multidetector CT imaging of the head and neck was performed using the standard protocol during bolus administration of intravenous contrast. Multiplanar CT image  reconstructions and MIPs were obtained to evaluate the vascular anatomy. Carotid stenosis measurements (when applicable) are obtained utilizing NASCET criteria, using the distal internal carotid diameter as the denominator. RADIATION DOSE REDUCTION: This exam was performed according to the departmental dose-optimization program which includes automated exposure control, adjustment of the mA and/or kV according to patient size and/or use of iterative reconstruction technique. CONTRAST:  87mL OMNIPAQUE IOHEXOL 300 MG/ML  SOLN COMPARISON:  Same-day CT brain FINDINGS: CT HEAD FINDINGS Brain: See same day brain. 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 Vascular: See below Skull: Normal. Negative for fracture or focal lesion. Sinuses/Orbits: Bilateral lens replacements. Other: None Review of the MIP images confirms the above findings CTA NECK FINDINGS Aortic arch: Right carotid system: Right common carotid and cervical ICAs are markedly tortuous. There is mild narrowing of the cavernous segment  of the right ICA. Left carotid system: Left common carotid and cervical ICAs are markedly tortuous. There is mild narrowing of the left common carotid artery (series 6, image 229) secondary to soft atherosclerotic plaque. There is also mild narrowing of the origin of the left ICA secondary to soft atherosclerotic plaque. There is mild narrowing of the cavernous segment of the left ICA. Vertebral arteries: There is mild-to-moderate narrowing of the origin of the right vertebral artery. There is moderate to severe narrowing in the distal V4 segment of the right vertebral artery (series 6, image 141). Skeleton: Multilevel degenerative changes with severe neural foraminal narrowing in the upper and mid cervical spine. Other neck: Negative. Upper chest: Negative. Review of the MIP images confirms the above findings CTA HEAD FINDINGS Anterior circulation: There is moderate focal stenosis at the M1  segment of the right ICA (series 7, image 92). There is mild-to-moderate narrowing of the A1 segment of the left ACA (series 6, image 103). 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 The P2 segment of the right PCA is predominantly supplied by a small caliber PCOM which likely demonstrates at least mild-to-moderate stenosis at the distal portion (series 6, image 109). There is severe stenosis in the P2 segment of the left PCA (series 100, image 112). Venous sinuses: As permitted by contrast timing, patent. 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 No evidence of large vessel occlusion. Electronically Signed   By: Marin Roberts M.D.   On: 01/27/2022 16:15   CT Head Wo Contrast  Result Date: 01/27/2022 CLINICAL DATA:  Right facial numbness beginning yesterday EXAM: CT HEAD WITHOUT CONTRAST TECHNIQUE: Contiguous axial images were obtained from the base of the skull through the vertex without intravenous contrast. RADIATION DOSE REDUCTION: This exam was performed according to the departmental dose-optimization program which includes automated exposure control, adjustment of the mA and/or kV according to patient size and/or use of iterative reconstruction technique. COMPARISON:  CT head 07/05/2013 FINDINGS: Brain: There is no acute intracranial hemorrhage, extra-axial fluid collection, or acute large vessel territorial infarct. There is patchy hypodensity in the lentiform nuclei bilaterally, left more than right which could reflect acute or subacute infarct. Gray-white differentiation is otherwise preserved. Background  parenchymal volume is normal. Gray-white differentiation is preserved. Ventricles are normal in size. Additional mild hypodensity in the supratentorial white matter likely reflects sequela of underlying chronic small vessel ischemic change There is no mass lesion.  There is no mass effect or midline shift. Vascular: There is calcification of the bilateral carotid siphons and vertebral arteries. Skull: Normal. Negative for fracture or focal lesion. Sinuses/Orbits: The imaged paranasal sinuses are clear. A right lens implant is noted. The globes and orbits are otherwise unremarkable. Other: None. IMPRESSION: 1. Patchy hypodensity in the lentiform nuclei bilaterally could reflect acute or subacute infarcts. Consider brain MRI for further evaluation. 2. No acute intracranial hemorrhage or large vessel territorial infarct. Electronically Signed   By: Valetta Mole M.D.   On: 01/27/2022 14:25    EKG: Independently reviewed.  Sinus rhythm, rate 61, QTc 458.  No significant ST or T wave changes.  Assessment/Plan Principal Problem:   Acute  CVA (cerebrovascular accident) [facility]) Active Problems:   Paroxysmal atrial fibrillation (HCC)   HTN (hypertension)  Assessment and Plan: * Acute CVA (cerebrovascular accident) (HCC) Right-sided numbness involving face upper and lower extremities.  No other focal neurologic deficits.  History of TIA about 10 years ago involving right side with similar but milder symptoms. 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 - EDP talked to neurologist Dr. Amada Jupiter, recommended admission to [facility] -MRI brain -Deffer for carotid ultrasound, CTA neck done -Obtain limited echo, recent echo 09/2021-EF 60 to 65% with grade  1 DD. -Bedside swallow eval -PT OT  eval -Lipid panel, hemoglobin A1c -Will start atorvastatin 40 mg daily -Aspirin 325 X 1, and 300 mgx 1 Plavix given in ED -Holding Eliquis for now pending neurology eval -Continue aspirin 81 mg for now pending neurology eval for antiplatelet/anticoagulation recommendations -Allow for permissive hypertension, IV hydralazine 10 mg for blood pressure > 220/120. -Addendum-called to bedside: Patient reports that her symptoms of numbness is worse.  This was after she was told she needed an MRI, and she declined stating claustrophobia even if Ativan was given. On my re-evaluation -she is hyperventilating, tearful, appears anxious, talking about wanting to prepare for Christmas.  She denies prior anxiety or panic attacks.  On neurologic exam- touch sensation is still intact globally, with 5/5 strength in all extremities.  Vision to both eyes intact.  Tongue without fasciculations.  Still with slight flattening of right nasolabial fold at rest.  Speech intact.   1 mg Ativan given.  Likely panic/anxiety attack.   HTN (hypertension) Blood pressure 160s to 206 systolic.  Reports normally her blood pressure runs in the 160s. -Hold losartan, metoprolol allow for permissive hypertension -As needed hydralazine 10 mg  Paroxysmal atrial fibrillation (HCC) Currently in sinus rhythm.  Reports compliance with Eliquis -Hold Eliquis for now pending neurology eval -Plavix and aspirin given in ED   DVT prophylaxis: Lovenox, discontinue and resume Eliquis pending neurology eval Code Status:  Full code Family Communication: None at bedside Disposition Plan: ~ 2days Consults called: Neurology Admission status: Inpt tele I certify that at the point of admission it is my clinical judgment that the patient will require inpatient hospital care spanning beyond 2 midnights from the point of admission due to high intensity of service, high risk for further deterioration and high  frequency of surveillance required.   Author: Onnie Boer, MD 01/27/2022 7:02 PM  For on call review [email or web address].

## 2022-01-27 NOTE — Assessment & Plan Note (Signed)
Blood pressure 321Y to 248 systolic.  Reports normally her blood pressure runs in the 160s. -Hold losartan, metoprolol allow for permissive hypertension -As needed hydralazine 10 mg

## 2022-01-27 NOTE — Telephone Encounter (Signed)
Spoke with patient who stated she is in the ED and wanted to know if she should have a CT scan that has been ordered. Explained that a CT scan will help the physicians make a diagnosis and treatment plan for her. She thanked me for calling her back.

## 2022-01-27 NOTE — Telephone Encounter (Signed)
Pt c/o BP issue: STAT if pt c/o blurred vision, one-sided weakness or slurred speech  1. What are your last 5 BP readings?   162/110  HR 57  2. Are you having any other symptoms (ex. Dizziness, headache, blurred vision, passed out)?   No  3. What is your BP issue?    Patient states she is having numbness on the right side of her body.  Her face, leg and arm are feeling numb.  Patient states when she lies down it goes away but when she stands up it comes back.

## 2022-01-27 NOTE — Telephone Encounter (Signed)
Pt is currently at the ED and would like a callback regarding MRI that hospital is wanting to do. Please advise

## 2022-01-27 NOTE — ED Triage Notes (Signed)
Per EMS pt called for right sided numbness (face to legs) started yesterday at 1630. Comes and goes. Rest stops numbness. Numbness present during movement. Extra stress recently.  VS  BP 195/94 HR 53 Pulse ox: 98 % RA CBG 110

## 2022-01-27 NOTE — Assessment & Plan Note (Addendum)
Right-sided numbness involving face upper and lower extremities.  No other focal neurologic deficits.  History of TIA about 10 years ago involving right side with similar but milder symptoms. 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 - EDP talked to neurologist Dr. Leonel Ramsay, recommended admission to [facility] -MRI brain -Deffer for carotid ultrasound, CTA neck done -Obtain limited echo, recent echo 09/2021-EF 60 to 65% with grade 1 DD. -Bedside swallow eval -PT OT  eval -Lipid panel, hemoglobin A1c -Will start atorvastatin 40 mg daily -Aspirin 325 X 1, and 300 mgx 1 Plavix given in ED -Holding Eliquis for now pending neurology eval -Continue aspirin 81 mg for now pending neurology eval for antiplatelet/anticoagulation recommendations -Allow for permissive hypertension, IV hydralazine 10 mg for blood pressure > 220/120. -Addendum-called to bedside: Patient reports that her symptoms of numbness is worse.  This was after she was told she needed an MRI, and she declined stating claustrophobia even if Ativan was given. On my re-evaluation -she is hyperventilating, tearful, appears anxious, talking about wanting to prepare for Christmas.  She denies prior anxiety or panic attacks.  On neurologic exam- touch sensation is still intact globally, with 5/5 strength in all extremities.  Vision to both eyes intact.  Tongue without fasciculations.  Still with slight flattening of right nasolabial fold at rest.  Speech intact.   1 mg Ativan given.  Likely panic/anxiety attack.

## 2022-01-28 ENCOUNTER — Inpatient Hospital Stay (HOSPITAL_COMMUNITY): Payer: Medicare HMO

## 2022-01-28 ENCOUNTER — Other Ambulatory Visit (HOSPITAL_COMMUNITY): Payer: Medicare HMO

## 2022-01-28 DIAGNOSIS — I651 Occlusion and stenosis of basilar artery: Secondary | ICD-10-CM

## 2022-01-28 DIAGNOSIS — I480 Paroxysmal atrial fibrillation: Secondary | ICD-10-CM | POA: Diagnosis not present

## 2022-01-28 DIAGNOSIS — I6389 Other cerebral infarction: Secondary | ICD-10-CM

## 2022-01-28 DIAGNOSIS — I10 Essential (primary) hypertension: Secondary | ICD-10-CM | POA: Diagnosis not present

## 2022-01-28 DIAGNOSIS — I639 Cerebral infarction, unspecified: Secondary | ICD-10-CM | POA: Diagnosis not present

## 2022-01-28 LAB — ECHOCARDIOGRAM LIMITED
Height: 63.5 in
S' Lateral: 2.5 cm
Weight: 2688 oz

## 2022-01-28 LAB — HEMOGLOBIN A1C
Hgb A1c MFr Bld: 5.5 % (ref 4.8–5.6)
Mean Plasma Glucose: 111.15 mg/dL

## 2022-01-28 LAB — LIPID PANEL
Cholesterol: 226 mg/dL — ABNORMAL HIGH (ref 0–200)
HDL: 35 mg/dL — ABNORMAL LOW (ref >40)
LDL Cholesterol: 140 mg/dL — ABNORMAL HIGH (ref 0–99)
Total CHOL/HDL Ratio: 6.5 RATIO
Triglycerides: 253 mg/dL — ABNORMAL HIGH (ref <150)
VLDL: 51 mg/dL — ABNORMAL HIGH (ref 0–40)

## 2022-01-28 NOTE — ED Notes (Signed)
Pt was given dinner tray.  

## 2022-01-28 NOTE — Evaluation (Signed)
Physical Therapy Evaluation Patient Details Name: Anita Fletcher MRN: [id number] DOB: 1945-09-22 Today's Date: 01/28/2022  History of Present Illness  Anita Fletcher is a 76 y.o. female with medical history significant for TIA, hypertension, atrial fibrillation.  Patient presented to the ED with complaints of numbness to her right face, right hand and right lower extremity this is about 4.30 p.m. yesterday.  She reports symptoms are present mostly when she is standing, when she is lying down she does not have any symptoms.  She denies facial asymmetry, no change in speech no change in vision no weakness of her extremities.  She is compliant with her Eliquis.  She reports that about 10 years ago, she was diagnosed with a TIA, then she had tingling involving the right side of her face and extremities that lasted about 15 minutes and resolved, symptoms were also mild then. (Per MD)   Clinical Impression  Patient functioning at baseline for functional mobility and gait demonstrating good return for ambulation in room and hallways without loss of balance.  Plan:  Patient discharged from physical therapy to care of nursing for ambulation daily as tolerated for length of stay.         Recommendations for follow up therapy are one component of a multi-disciplinary discharge planning process, led by the attending physician.  Recommendations may be updated based on patient status, additional functional criteria and insurance authorization.  Follow Up Recommendations No PT follow up      Assistance Recommended at Discharge PRN  Patient can return home with the following  Other (comment) (near baseline)    Equipment Recommendations None recommended by PT  Recommendations for Other Services       Functional Status Assessment Patient has not had a recent decline in their functional status     Precautions / Restrictions Precautions Precautions: None Restrictions Weight Bearing Restrictions: No       Mobility  Bed Mobility Overal bed mobility: Independent                  Transfers Overall transfer level: Independent Equipment used: None                    Ambulation/Gait Ambulation/Gait assistance: Independent, Modified independent (Device/Increase time) Gait Distance (Feet): 200 Feet Assistive device: None Gait Pattern/deviations: WFL(Within Functional Limits) Gait velocity: near normal     General Gait Details: Patient grossly WFL with good return demonstrated for ambulation in room and hallways without loss of balance  Stairs            Wheelchair Mobility    Modified Rankin (Stroke Patients Only)       Balance Overall balance assessment: Independent                                           Pertinent Vitals/Pain Pain Assessment Pain Assessment: No/denies pain    Home Living Family/patient expects to be discharged to:: Private residence Living Arrangements: Non-relatives/Friends ("boyfriend") Available Help at Discharge: Friend(s);Available 24 hours/day Type of Home: House Home Access: Stairs to enter Entrance Stairs-Rails: Can reach both Entrance Stairs-Number of Steps: 2 to 3 Alternate Level Stairs-Number of Steps: 15 Home Layout: Two level Home Equipment: Rolling Walker (2 wheels);Grab bars - tub/shower Additional Comments: 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    Prior Function Prior Level of Function : Independent/Modified Independent  Mobility Comments: Community ambulator without AD; drives. ADLs Comments: Independent     Hand Dominance   Dominant Hand: Right    Extremity/Trunk Assessment   Upper Extremity Assessment Upper Extremity Assessment: Defer to OT evaluation    Lower Extremity Assessment Lower Extremity Assessment: Overall WFL for tasks assessed    Cervical / Trunk Assessment Cervical / Trunk Assessment: Normal  Communication   Communication: No difficulties   Cognition Arousal/Alertness: Awake/alert Behavior During Therapy: WFL for tasks assessed/performed Overall Cognitive Status: Within Functional Limits for tasks assessed                                          General Comments      Exercises     Assessment/Plan    PT Assessment Patient does not need any further PT services  PT Problem List         PT Treatment Interventions      PT Goals (Current goals can be found in the Care Plan section)  Acute Rehab PT Goals Patient Stated Goal: return home PT Goal Formulation: With patient Time For Goal Achievement: 01/28/22 Potential to Achieve Goals: Good    Frequency       Co-evaluation PT/OT/SLP Co-Evaluation/Treatment: Yes Reason for Co-Treatment: To address functional/ADL transfers PT goals addressed during session: Mobility/safety with mobility;Balance OT goals addressed during session: ADL's and self-care       AM-PAC PT "6 Clicks" Mobility  Outcome Measure Help needed turning from your back to your side while in a flat bed without using bedrails?: None Help needed moving from lying on your back to sitting on the side of a flat bed without using bedrails?: None Help needed moving to and from a bed to a chair (including a wheelchair)?: None Help needed standing up from a chair using your arms (e.g., wheelchair or bedside chair)?: None Help needed to walk in hospital room?: None Help needed climbing 3-5 steps with a railing? : None 6 Click Score: 24    End of Session   Activity Tolerance: Patient tolerated treatment well Patient left: in chair;with call bell/phone within reach Nurse Communication: Mobility status PT Visit Diagnosis: Unsteadiness on feet (R26.81);Other abnormalities of gait and mobility (R26.89);Muscle weakness (generalized) (M62.81)    Time: 5361-4431 PT Time Calculation (min) (ACUTE ONLY): 18 min   Charges:   PT Evaluation $PT Eval Low Complexity: 1 Low PT  Treatments $Therapeutic Activity: 8-22 mins       11:52 AM, 01/28/22 Ocie Bob, MPT Physical Therapist with [facility] 336 [phone] office [phone] mobile phone

## 2022-01-28 NOTE — ED Notes (Signed)
Pt does not have IV at this time. States she wants to wait until she "gets to [facility]" before another one is placed as "it bothered her".

## 2022-01-28 NOTE — TOC Progression Note (Signed)
  Transition of Care [facility]) Screening Note   Patient Details  Name: Anita Fletcher Date of Birth: 09-Feb-1946   Transition of Care [facility]) CM/SW Contact:    Boneta Lucks, RN Phone Number: 01/28/2022, 2:38 PM  Needs MRI at [facility]  Transition of Care Department [facility]) has reviewed patient and no TOC needs have been identified at this time. We will continue to monitor patient advancement through interdisciplinary progression rounds. If new patient transition needs arise, please place a TOC consult.      Barriers to Discharge: Continued Medical Work up

## 2022-01-28 NOTE — Evaluation (Signed)
Occupational Therapy Evaluation Patient Details Name: Anita Fletcher MRN: [id number] DOB: 1945-09-19 Today's Date: 01/28/2022   History of Present Illness Anita Fletcher is a 76 y.o. female with medical history significant for TIA, hypertension, atrial fibrillation.  Patient presented to the ED with complaints of numbness to her right face, right hand and right lower extremity this is about 4.30 p.m. yesterday.  She reports symptoms are present mostly when she is standing, when she is lying down she does not have any symptoms.  She denies facial asymmetry, no change in speech no change in vision no weakness of her extremities.  She is compliant with her Eliquis.  She reports that about 10 years ago, she was diagnosed with a TIA, then she had tingling involving the right side of her face and extremities that lasted about 15 minutes and resolved, symptoms were also mild then. (Per MD)   Clinical Impression   Pt agreeable to OT and PT co-evaluation. Pt appears to be near baseline levels for functional mobility and ADL's. Pt is still reporting R UE numbness but strength, coordination and rang of motion are all within functional limits. Pt was able to ambulate within hall and adjust socks without difficulty. Pt is not recommend for further acute OT services and will be discharged to care of nursing staff for remaining length of stay.       Recommendations for follow up therapy are one component of a multi-disciplinary discharge planning process, led by the attending physician.  Recommendations may be updated based on patient status, additional functional criteria and insurance authorization.   Follow Up Recommendations  No OT follow up    Assistance Recommended at Discharge None  Patient can return home with the following      Functional Status Assessment  Patient has not had a recent decline in their functional status  Equipment Recommendations  None recommended by OT    Recommendations for  Other Services       Precautions / Restrictions Precautions Precautions: None Restrictions Weight Bearing Restrictions: No      Mobility Bed Mobility Overal bed mobility: Independent                  Transfers Overall transfer level: Independent Equipment used: None               General transfer comment: Pt able to ambulate in hall with good balance and return to chair.      Balance Overall balance assessment: Independent                                         ADL either performed or assessed with clinical judgement   ADL Overall ADL's : Independent                                       General ADL Comments: Pt shows good standing balance and abiilty to reach down to B LE for ADL tasks.     Vision Baseline Vision/History: 1 Wears glasses Ability to See in Adequate Light: 0 Adequate Patient Visual Report: No change from baseline Vision Assessment?: No apparent visual deficits     Perception     Praxis      Pertinent Vitals/Pain Pain Assessment Pain Assessment: No/denies pain     Hand Dominance  Right   Extremity/Trunk Assessment Upper Extremity Assessment Upper Extremity Assessment: Overall WFL for tasks assessed (Pt reorts R hand numbness but displayed good coordination for sequential finger touching.)   Lower Extremity Assessment Lower Extremity Assessment: Defer to PT evaluation   Cervical / Trunk Assessment Cervical / Trunk Assessment: Normal   Communication Communication Communication: No difficulties   Cognition Arousal/Alertness: Awake/alert Behavior During Therapy: WFL for tasks assessed/performed Overall Cognitive Status: Within Functional Limits for tasks assessed                                                        Home Living Family/patient expects to be discharged to:: Private residence Living Arrangements: Non-relatives/Friends ("boyfriend") Available Help at  Discharge: Friend(s);Available 24 hours/day Type of Home: House Home Access: Stairs to enter [employer] of Steps: 2 to 3 Entrance Stairs-Rails: Can reach both Home Layout: Two level Alternate Level Stairs-Number of Steps: 15 Alternate Level Stairs-Rails: Left (no rail for first 5 steps then L only going up.) Bathroom Shower/Tub: Chief Strategy Officer: Standard Bathroom Accessibility: Yes How Accessible: Accessible via walker Home Equipment: Rolling Walker (2 wheels);Grab bars - tub/shower   Additional Comments: 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      Prior Functioning/Environment Prior Level of Function : Independent/Modified Independent             Mobility Comments: Community ambulator without AD; drives. ADLs Comments: Independent                      OT Goals(Current goals can be found in the care plan section) Acute Rehab OT Goals Patient Stated Goal: return home  OT Frequency:      Co-evaluation PT/OT/SLP Co-Evaluation/Treatment: Yes Reason for Co-Treatment: To address functional/ADL transfers   OT goals addressed during session: ADL's and self-care                       End of Session    Activity Tolerance: Patient tolerated treatment well Patient left: in chair;with call bell/phone within reach  OT Visit Diagnosis: Other symptoms and signs involving the nervous system (L84.536)                Time: 4680-3212 OT Time Calculation (min): 10 min Charges:  OT General Charges $OT Visit: 1 Visit OT Evaluation $OT Eval Low Complexity: 1 Low  Cinde Ebert OT, MOT  Danie Chandler 01/28/2022, 9:37 AM

## 2022-01-28 NOTE — Progress Notes (Signed)
*  PRELIMINARY RESULTS* Echocardiogram Limited 2D Echocardiogram has been performed.  Elpidio Anis 01/28/2022, 12:16 PM

## 2022-01-28 NOTE — Progress Notes (Signed)
PROGRESS NOTE    Anita Fletcher  ZJQ:[id number] DOB: 01/04/1946 DOA: 01/27/2022 PCP: Patient, No Pcp Per    Brief Narrative:  76 year old female with a history of paroxysmal atrial fibrillation on anticoagulation, hypertension, prior history of TIA, presents to the hospital with right arm, right face, right leg numbness.  CT head indicated possible CVA.  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 Leonel Ramsay who recommended transfer to [facility] for further evaluation -She received full dose aspirin in the emergency room on arrival and Plavix 300 mg -Current recommendations from neurology are to continue on aspirin, Plavix and statin until further work-up can be determined prior to resuming anticoagulation -Allowing permissive hypertension -PT/OT -LDL 140, started on Lipitor 40 mg -She will need MRI brain for further evaluation, but due to severe claustrophobia, this will likely need to be pursued with general anesthesia.  Hypertension -Chronically on losartan and metoprolol -Holding to allow permissive hypertension  Paroxysmal atrial fibrillation -Currently in sinus rhythm -Reported compliance with Eliquis prior to admission -Holding Eliquis for now pending neurology  evaluation -Continued on aspirin and Plavix  Claustrophobia -Severe claustrophobia at baseline, has difficulty even riding in elevators -She did receive some Ativan yesterday for anxiety, which she did not feel that was helpful -She does not wish to take any further medications for claustrophobia or anxiety at this time    DVT prophylaxis: enoxaparin (LOVENOX) injection 40 mg Start: 01/27/22 1815  Code Status: Full code Family Communication: Discussed with patient Disposition Plan: Status is: Inpatient Remains inpatient appropriate because: Transfer to [facility] for MRI and neurology evaluation     Consultants:  Neurology  Procedures:    Antimicrobials:      Subjective: Reports that she has continued numbness in her right hand.  Overall numbness in her right lower extremity and right face are better than yesterday.  No dizziness on standing.  No vision changes.  Objective: Vitals:   01/28/22 0600 01/28/22 0630 01/28/22 0800 01/28/22 0826  BP: (!) 164/92 (!) 166/99 (!) 195/124   Pulse: 63 (!) 59 70   Resp: (!) 21 20 (!) 21   Temp:    98.3 F (36.8 C)  TempSrc:    Oral  SpO2: 92% 93% (!) 87%   Weight:      Height:       No intake or output data in the 24 hours ending 01/28/22 1108 Filed Weights   01/27/22 1300  Weight: 76.2 kg    Examination:  General exam: Appears calm and comfortable  Respiratory system: Clear to auscultation. Respiratory effort normal. Cardiovascular system: S1 & S2 heard, RRR. No JVD, murmurs, rubs, gallops or clicks.  Gastrointestinal system: Abdomen is nondistended, soft and nontender. No organomegaly or masses felt. Normal bowel sounds heard. Central nervous system: Alert and oriented.  Appears to have some right-sided nasolabial flattening.  Still has numbness  in her right hand.  4/5 strength in the right lower extremity otherwise 5 out of 5 in remaining extremities Extremities: Mild edema in the lower extremities bilaterally Skin:  No rashes, lesions or ulcers Psychiatry: Judgement and insight appear normal. Mood & affect appropriate.     Data Reviewed: I have personally reviewed following labs and imaging studies  CBC: Recent Labs  Lab 01/27/22 1352  WBC 7.1  NEUTROABS 4.4  HGB 14.9  HCT 44.4  MCV 92.7  PLT 253   Basic Metabolic Panel: Recent Labs  Lab 01/27/22 1352  NA 141  K 3.6  CL 107  CO2 26  GLUCOSE 82  BUN 11  CREATININE 0.76  CALCIUM 9.0   GFR: Estimated Creatinine Clearance: 61 mL/min (by C-G formula based on SCr of 0.76 mg/dL). Liver Function Tests: Recent Labs  Lab 01/27/22 1352  AST 14*  ALT 15  ALKPHOS 56  BILITOT 0.5  PROT 6.8  ALBUMIN 3.8   No results for input(s): "LIPASE", "AMYLASE" in the last 168 hours. No results for input(s): "AMMONIA" in the last 168 hours. Coagulation Profile: No results for input(s): "INR", "PROTIME" in the last 168 hours. Cardiac Enzymes: No results for input(s): "CKTOTAL", "CKMB", "CKMBINDEX", "TROPONINI" in the last 168 hours. BNP (last 3 results) No results for input(s): "PROBNP" in the last 8760 hours. HbA1C: Recent Labs    01/28/22 0319  HGBA1C 5.5   CBG: Recent Labs  Lab 01/27/22 1354  GLUCAP 77   Lipid Profile: Recent Labs    01/28/22 0319  CHOL 226*  HDL 35*  LDLCALC 140*  TRIG 253*  CHOLHDL 6.5   Thyroid Function Tests: No results for input(s): "TSH", "T4TOTAL", "FREET4", "T3FREE", "THYROIDAB" in the last 72 hours. Anemia Panel: No results for input(s): "VITAMINB12", "FOLATE", "FERRITIN", "TIBC", "IRON", "RETICCTPCT" in the last 72 hours. Sepsis Labs: No results for input(s): "PROCALCITON", "LATICACIDVEN" in the last 168 hours.  No results found for this or any previous visit (from the past 240 hour(s)).       Radiology Studies: CT ANGIO HEAD NECK W WO CM  Result Date: 01/27/2022 CLINICAL DATA:  Stroke code right facial numbness/tingling EXAM: CT ANGIOGRAPHY HEAD AND NECK TECHNIQUE: Multidetector CT  imaging of the head and neck was performed using the standard protocol during bolus administration of intravenous contrast. Multiplanar CT image reconstructions and MIPs were obtained to evaluate the vascular anatomy. Carotid stenosis measurements (when applicable) are obtained utilizing NASCET criteria, using the distal internal carotid diameter as the denominator. RADIATION DOSE REDUCTION: This exam was performed according to the departmental dose-optimization program which includes automated exposure control, adjustment of the mA and/or kV according to patient size and/or use of iterative reconstruction technique. CONTRAST:  34mL OMNIPAQUE IOHEXOL 300 MG/ML  SOLN COMPARISON:  Same-day CT brain FINDINGS: CT HEAD FINDINGS Brain: See same day brain. 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 Vascular: See below Skull: Normal. Negative for fracture or focal lesion. Sinuses/Orbits: Bilateral lens replacements. Other: None Review of the MIP images confirms the above findings CTA NECK FINDINGS Aortic arch: Right carotid system: Right common carotid and cervical ICAs are markedly tortuous. There is mild narrowing of the cavernous segment of the right ICA. Left carotid system: Left common carotid and cervical ICAs are markedly tortuous. There is mild narrowing of the left common carotid artery (series 6, image 229) secondary to soft atherosclerotic plaque. There is also mild narrowing of the origin of the left ICA secondary to soft  atherosclerotic plaque. There is mild narrowing of the cavernous segment of the left ICA. Vertebral arteries: There is mild-to-moderate narrowing of the origin of the right vertebral artery. There is moderate to severe narrowing in the distal V4 segment of the right vertebral artery (series 6, image 141). Skeleton: Multilevel degenerative changes with severe neural foraminal narrowing in the upper and mid cervical spine. Other neck: Negative. Upper chest:  Negative. Review of the MIP images confirms the above findings CTA HEAD FINDINGS Anterior circulation: There is moderate focal stenosis at the M1 segment of the right ICA (series 7, image 92). There is mild-to-moderate narrowing of the A1 segment of the left ACA (series 6, image 103). 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 The P2 segment of the right PCA is predominantly supplied by a small caliber PCOM which likely demonstrates at least mild-to-moderate stenosis at the distal portion (series 6, image 109). There is severe stenosis in the P2 segment of the left PCA (series 100, image 112). Venous sinuses: As permitted by contrast timing, patent. 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 No evidence of large vessel occlusion. Electronically Signed   By: Lorenza Cambridge M.D.   On: 01/27/2022 16:15   CT Head Wo Contrast  Result Date: 01/27/2022 CLINICAL DATA:  Right facial numbness beginning yesterday EXAM: CT HEAD WITHOUT CONTRAST TECHNIQUE: Contiguous axial images were obtained from the base of the skull through the vertex without intravenous contrast. RADIATION DOSE REDUCTION: This exam was performed according to the departmental dose-optimization program which includes automated exposure control, adjustment of the mA and/or kV according to patient size and/or use of iterative reconstruction technique. COMPARISON:  CT head 07/05/2013 FINDINGS: Brain: There is no acute intracranial hemorrhage, extra-axial fluid collection, or acute large vessel territorial infarct. There is patchy hypodensity in the lentiform nuclei  bilaterally, left more than right which could reflect acute or subacute infarct. Gray-white differentiation is otherwise preserved. Background parenchymal volume is normal. Gray-white differentiation is preserved. Ventricles are normal in size. Additional mild hypodensity in the supratentorial white matter likely reflects sequela of underlying chronic small vessel ischemic change There is no mass lesion.  There is no mass effect or midline shift. Vascular: There is calcification of the bilateral carotid siphons and vertebral arteries. Skull: Normal. Negative for fracture or focal lesion. Sinuses/Orbits: The imaged paranasal sinuses are clear. A right lens implant is noted. The globes and orbits are otherwise unremarkable. Other: None. IMPRESSION: 1. Patchy hypodensity in the lentiform nuclei bilaterally could reflect acute or subacute infarcts. Consider brain MRI for further evaluation. 2. No acute intracranial hemorrhage or large vessel territorial infarct. Electronically Signed   By: Lesia Hausen M.D.   On: 01/27/2022 14:25        Scheduled Meds:   stroke: early stages of recovery book   Does not apply Once   aspirin  81 mg Oral Daily   atorvastatin  40 mg Oral Daily   clopidogrel  75 mg Oral Daily   enoxaparin (LOVENOX) injection  40 mg Subcutaneous Q24H   Continuous Infusions:   LOS: 1 day    Time spent:    Erick Blinks, MD  [facility]   If 7PM-7AM, please contact night-coverage [email or web address]  01/28/2022, 11:08 AM

## 2022-01-28 NOTE — ED Notes (Signed)
Pt ambulated to restroom with minimal assistance.

## 2022-01-28 NOTE — ED Notes (Signed)
Pt transferred to ED04 w/ a hospital bed so that she is more comfortable, given her extended ER stay.

## 2022-01-28 NOTE — ED Notes (Signed)
IV site became tender and irritated. IV access removed. Will establish new access when necessary.

## 2022-01-28 NOTE — Plan of Care (Signed)
Discussed with Dr. Roderic Palau this morning re: patient. She continues to have some right sided numbness. I would favor getting MRI to assess whether her multifocal posterior circulation stenosis is symptomatic. May need to consider angiogram depending on those results. She may need this done with conscious sedation which is only done at [facility]. I would continue dual antiplatelet therapy for now in the setting of acute stroke, would favor getting MRI to assess timing for re-instatement of anticoagulation.   We will see in consultation on arrival to [facility]   Roland Rack, MD [facility] [phone]  If 7pm- 7am, please page neurology on call as listed in [place].

## 2022-01-28 NOTE — ED Notes (Signed)
Pt to bathroom with minimal assistance. Pt washed face, brushed hair, brushed teeth and assisted back to bed.

## 2022-01-29 DIAGNOSIS — I639 Cerebral infarction, unspecified: Secondary | ICD-10-CM | POA: Diagnosis not present

## 2022-01-29 MED ORDER — ALPRAZOLAM 0.25 MG PO TABS
0.2500 mg | ORAL_TABLET | Freq: Three times a day (TID) | ORAL | Status: DC | PRN
  Administered 2022-01-29 – 2022-01-30 (×4): 0.25 mg via ORAL
  Filled 2022-01-29 (×4): qty 1

## 2022-01-29 NOTE — Consult Note (Addendum)
Neurology Consultation    Reason for Consult: Right sensory changes  CC: Numb right side 3 days ago intermittently, then persistent x 2 days  HISTORY OF PRESENT ILLNESS    [name] is a pleasant 76 year old lady with a past medical history of HTN and atrial fibrillation on apixaban. There is also a history of TIA, the patient stating that 10 years ago she had tingling to the right side of her face and extremities similar to this that lasted 15 minutes which resolved.  Not taking any antiplatelet medication since that time.  Her issues began this past Sunday, 3 days prior to this evaluation.  She felt her right foot become numb, followed by arm and then face numbness on the right side in that order.  This lasted less than 1 minute with spontaneous resolution.  Later that evening, she had an episode lasting more than a minute but under 5 minutes that was identical.  She woke up in the middle of the night and again noted this numbness and the son called EMS.  Since that third episode, numbness has remained without change in character or severity.  It affects right side of face, right arm including hand and fingers, and right leg from mid shin down distal to foot including all toes.  She herself has not noted any weakness.  She is having difficulties ambulating now but is able to do so cautiously. There is some right sided incoordination. She denies positional component without change lying flat versus seated versus standing.  Initial blood pressures were [id number] systolic with heart rate in the 50s.  Her head CT has severe small vessel ischemic changes of the white matter but no acute hypodensity ascertained.  Her CTA head and neck shows multifocal intracranial atherosclerotic disease as below.  Of note, course at [facility] complicated by panic attack with discussion of obtaining MRI brain with even ativan, expressed desire to be completely sedated.   Transfer to [facility] for  evaluation/treatment. History is obtained from:patient.   Premorbid modified Rankin scale (mRS): 0 The patient denies headache, dizziness, visual changes, problems with swallowing or speech, focal muscle weakness, abnormal movements, or other focal neurological deficits.  ROS: Full ROS was performed and is negative except as noted in the HPI.   PAST MEDICAL HISTORY    Past Medical History:  Past Medical History:  Diagnosis Date   Acute CVA (cerebrovascular accident) ([place]) 01/27/2022   HTN (hypertension) 01/27/2022   TIA (transient ischemic attack)     No family history on file. Family History  Problem Relation Age of Onset   Arrhythmia Brother     Allergies:  Allergies  Allergen Reactions   Codeine     Social History:   reports that she has never smoked. She does not have any smokeless tobacco history on file. She reports that she does not drink alcohol and does not use drugs.    Medications Medications Prior to Admission  Medication Sig Dispense Refill   apixaban (ELIQUIS) 5 MG TABS tablet Take 1 tablet (5 mg total) by mouth 2 (two) times daily. 60 tablet 11   losartan (COZAAR) 50 MG tablet Take 1 tablet (50 mg total) by mouth daily. 90 tablet 3   metoprolol tartrate (LOPRESSOR) 25 MG tablet Take 1 tablet (25 mg total) by mouth 2 (two) times daily. 180 tablet 3    EXAMINATION    Current vital signs:    01/29/2022    2:21 PM 01/29/2022  12:30 PM 01/29/2022    9:30 AM  Vitals with BMI  Systolic [id number]  Diastolic 89 93 [id number]  Pulse 80 68 71    Examination:  GENERAL: Awake, alert in NAD. Upset at times, but appropriate  HEENT: - Normocephalic and atraumatic, dry mm, no lymphadenopathy, no Thyromegally LUNGS - Clear to auscultation bilaterally CV - S1S2 RRR, equal pulses bilaterally. ABDOMEN - Soft, nontender, nondistended with normoactive BS Ext: warm, well perfused, intact peripheral pulses, no pedal edema  NEURO:  Mental Status: AA&Ox4 Language: speech  is clear.Language with in tact naming, repetition, fluency, and comprehension. Cranial Nerves:  II: PERRL. Visual fields full to confrontation.  III, IV, VI: EOM in tact. Eyelids elevate symmetrically.  V: impaired to light touch right V1-3 compared to left   VII: + subtle right facial droop   VIII: hearing intact to voice IX, X: Palate elevates symmetrically. Phonation is normal.  [id number] shrug 5/5 and symmetrical  XII: tongue is midline without fasciculations. Motor:   Mvmt Root Nerve  Muscle Right Left Comments  SA C5/6 Ax Deltoid 4 5   EF C5/6 Mc Biceps 5 5   EE C6/7/8 Rad Triceps 5 5   WF C6/7 Med FCR 5 5   WE C7/8 PIN ECU 5 5   F Ab C8/T1 U ADM/FDI 5 5   HF L1/2/3 Fem Illopsoas 4 5   KE L2/3/4 Fem Quad Knee flexion 4  5   DF L4/5 D Peron Tib Ant 5 5   PF S1/2 Tibial Grc/Sol 5 5    Reflexes:  Right Left Comments  Pectoralis      Biceps (C5/6) 2 2   Brachioradialis (C5/6) 2 2    Triceps (C6/7) 2 2    Patellar (L3/4) 2 2    Achilles (S1) 2 2    Hoffman      Plantar     Jaw jerk    Sensation:  Light touch Impaired right hemibody 80% compared to left    Pin prick    Temperature    Vibration   Proprioception    Tone: is normal and bulk is normal Coordination: FTN ataxic on right UE and heel to shin ataxic RLE (sensory ataxia), no abnormal movements  Gait- Wide based, mild ataxia   NIHSS Right facial droop 1  Mild RUE pronator drift 1  Sensation R hemibody 1 Ataxia RUE/RLE: 2 TOTAL: 5  LABS   I have reviewed labs in epic and the results pertinent to this consultation are:   Lab Results  Component Value Date   LDLCALC 140 (H) 01/28/2022   Lab Results  Component Value Date   ALT 15 01/27/2022   AST 14 (L) 01/27/2022   ALKPHOS 56 01/27/2022   BILITOT 0.5 01/27/2022   Lab Results  Component Value Date   HGBA1C 5.5 01/28/2022   Lab Results  Component Value Date   WBC 7.1 01/27/2022   HGB 14.9 01/27/2022   HCT 44.4 01/27/2022   MCV 92.7  01/27/2022   PLT 253 01/27/2022   No results found for: "VITAMINB12" No results found for: "FOLATE" Lab Results  Component Value Date   NA 141 01/27/2022   K 3.6 01/27/2022   CL 107 01/27/2022   CO2 26 01/27/2022     DIAGNOSTIC IMAGING/PROCEDURES   I have reviewed the images obtained:, as below    01/28/2022 CT-head 1. Patchy hypodensity in the lentiform nuclei bilaterally could reflect acute or subacute infarcts versus severe small vessel  changes  2. No acute intracranial hemorrhage or large vessel territorial infarct.  01/28/2022 CTA head and neck  Right carotid system: Right common carotid and cervical ICAs are markedly tortuous. There is mild narrowing of the cavernous segment of the right ICA.   Left carotid system: Left common carotid and cervical ICAs are markedly tortuous. There is mild narrowing of the left common carotid artery (series 6, image 229) secondary to soft atherosclerotic plaque. There is also mild narrowing of the origin of the left ICA secondary to soft atherosclerotic plaque. There is mild narrowing of the cavernous segment of the left ICA.   Vertebral arteries: There is mild-to-moderate narrowing of the origin of the right vertebral artery. There is moderate to severe narrowing in the distal V4 segment of the right vertebral artery (series 6, image 141).   Skeleton: Multilevel degenerative changes with severe neural foraminal narrowing in the upper and mid cervical spine.  Anterior circulation: There is moderate focal stenosis at the M1 segment of the right ICA (series 7, image 92). There is mild-to-moderate narrowing of the A1 segment of the left ACA (series 6, image 103).   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 The P2 segment of the right PCA is predominantly supplied by a small caliber PCOM which likely demonstrates at  least mild-to-moderate stenosis at the distal portion (series 6, image 109). There is severe stenosis in the P2 segment of the left PCA (series 100, image 112).   Venous sinuses: As permitted by contrast timing, patent.   Anatomic variants: Fetal type right PCA  01/28/2022 TTE:  1. Limited study. Left ventricular ejection fraction, by estimation, is 70 to 75%. The left ventricle has hyperdynamic function. There is severe asymmetric left ventricular hypertrophy of the basal segment. Left ventricular diastolic function could not be evaluated. 2. The mitral valve is abnormal, mild to moderate annular calcification. The aortic valve is tricuspid. Aortic valve sclerosis is present, with no evidence of aortic valve stenosis. 4.The inferior vena cava is normal in size with greater than 50% respiratory variability, suggesting right atrial pressure of 3 mmHg. 5. Left atrial enlargement  MRI brain pending   ASSESSMENT/PLAN    Assessment:  This is a pleasant 76 year old lady presenting with right sided stuttering sensory deficits starting 3 days ago that then became persistent; the 3rd episode's symptoms have not remitted. She was found to have subtle right hemiparesis, and sensory ataxia causing gait difficulties.  - Exam reveals right hemiataxia, subtle right sided weakness and right sided hypoesthesia.  - Although initial Jacksonian march type dysthesia of RLE followed by RUE, followed by face could be due to seizure or migraine, neither now fit the overall clinical picture given evolution to sustained sensory deficits in cortical pattern and with concomitant weakness. More likely that patient has suffered subcortical left hemispheric ischemic infarction obscured by severe small vessel disease on head CT for which MRI would be more helpful for characterization.  - She is anticoagulated for her atrial fibrillation, but etiology of this infarction is most likely secondary to intracranial  atherosclerosis versus thromboembolic from ulcerated plaque of left carotid.  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 Continue dual antiplatelet therapy with plavix + aspirin for now considering strong evidence for ICAD and subsequent infarction; however, may need to transition to apixaban + antithrombotic monotherapy in setting of combined risk factors of atrial fibrillation and ICAD if Stroke Team determines to be the best option from a risk/benefit standpoint.   -Holding apixaban for now given DAPT   -Permissive HTN period has ended, gradual lowering of BP recommended  -Started on atorvastatin 40 mg daily, recommend increase to 80,  LDL 140  -Will discuss utility of DSA with NeuroIR for characterization of stenoses, in this case left MCA territory in which she is symptomatic, for consideration of stenting rather than solely medical management (although no longer with stuttering symptoms, suggesting tha medical management may be more appropriate). No evidence of LVO on examination that would require more urgent intervention.  -PT/OT evaluations, may be good acute rehab candidate based on examination currently  -a1c 5.5, wnl , non-tobacco user  -stroke education  - Dr. Debbrah Alar on call this week and we have spoken with her. Will do DSA tomorrow, made npo at midnight and ordered PT/INR for am   Discussed with and seen by Dr. Cheral Marker. Lennie Hummer, PA-C Neurology Pager # [phone]   I have seen and examined the patient. I have formulated the assessment and recommendations. 76  year old female with onset of stuttering TIA symptoms 3 days ago followed by progression to persistent deficit on the right. Exam findings best localize as a small left thalamic lacunar infarction possibly overlapping the left internal capsule. MRI pending for tomorrow. Other recommendations as above.  Electronically signed: Dr. Kerney Elbe

## 2022-01-29 NOTE — ED Notes (Addendum)
pt is c/o worsening numbness on R side of face, This RN did another full NIH and pt face does have asymmetry on R side now; which she previously scored a 0 on. Per assessment, that is the only change noted in NIH assessment from previous one--DO, Heath Lark, made aware, no new orders at this time

## 2022-01-29 NOTE — Progress Notes (Signed)
PROGRESS NOTE    Anita Fletcher  ZHG:[id number] DOB: [birth year] DOA: 01/27/2022 PCP: Patient, No Pcp Per   Brief Narrative:    76 year old female with a history of paroxysmal atrial fibrillation on anticoagulation, hypertension, prior history of TIA, presents to the hospital with right arm, right face, right leg numbness.  CT head indicated possible CVA.  She is being admitted to [facility] for further evaluation by neurology and MRI brain. Anticipating transfer today and Neurology team aware.  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 Leonel Ramsay who recommended transfer to [facility] for further evaluation with plans to transfer today -She received full dose aspirin in the emergency room on arrival and Plavix 300 mg -Current recommendations from neurology are to continue on aspirin, Plavix and statin until further work-up can be determined prior to resuming anticoagulation -Allowing permissive hypertension -PT/OT -LDL 140, started on Lipitor 40 mg -She will need MRI brain for further evaluation, but due to severe claustrophobia, this will likely need to be pursued with general anesthesia which will take place at [facility]   Hypertension -Chronically on losartan and metoprolol -Holding to allow permissive hypertension   Paroxysmal atrial  fibrillation -Currently in sinus rhythm -Reported compliance with Eliquis prior to admission -Holding Eliquis for now pending neurology evaluation -Continued on aspirin and Plavix   Claustrophobia -Severe claustrophobia at baseline, has difficulty even riding in elevators -She did receive some Ativan yesterday for anxiety, which she did not feel that was helpful -Xanax ordered as needed for anxiety       DVT prophylaxis: enoxaparin (LOVENOX) injection 40 mg Start: 01/27/22 1815   Code Status: Full code Family Communication: Discussed with patient Disposition Plan: Status is: Inpatient Remains inpatient appropriate because: Transfer to [facility] for MRI and neurology evaluation   Consultants:  Neurology   Procedures:   See above   Antimicrobials:   None    Subjective: Patient seen and evaluated today with some mild to moderate anxiety noted. No acute concerns or events noted overnight.  Objective: Vitals:   01/29/22 0900 01/29/22 0930 01/29/22 0932 01/29/22 1230  BP: (!) 181/96 (!) 151/123  (!) 177/93  Pulse: 70 71  68  Resp:  20  17  Temp:   98.3 F (36.8 C) 98.5 F (36.9 C)  TempSrc:   Oral Oral  SpO2: 98% 99%  99%  Weight:      Height:       No intake or output data in the 24 hours ending 01/29/22 1240 Filed Weights   01/27/22 1300  Weight: 76.2 kg    Examination:  General exam: Appears calm and comfortable  Respiratory system: Clear to auscultation. Respiratory effort normal. Cardiovascular system: S1 & S2 heard, RRR.  Gastrointestinal system: Abdomen is soft Central nervous system: Alert and awake Extremities: No edema Skin: No significant lesions noted Psychiatry: Flat affect.    Data Reviewed: I have personally reviewed following labs and imaging studies  CBC: Recent Labs  Lab 01/27/22 1352  WBC 7.1  NEUTROABS 4.4  HGB 14.9  HCT 44.4  MCV 92.7  PLT 253   Basic Metabolic Panel: Recent Labs  Lab 01/27/22 1352  NA 141  K 3.6  CL  107  CO2 26  GLUCOSE 82  BUN 11  CREATININE 0.76  CALCIUM 9.0   GFR: Estimated Creatinine Clearance: 61 mL/min (by C-G formula based on SCr of 0.76 mg/dL). Liver Function Tests: Recent Labs  Lab 01/27/22 1352  AST 14*  ALT 15  ALKPHOS 56  BILITOT 0.5  PROT 6.8  ALBUMIN 3.8   No results for input(s): "LIPASE", "AMYLASE" in the last 168 hours. No results for input(s): "AMMONIA" in the last 168 hours. Coagulation Profile: No results for input(s): "INR", "PROTIME" in the last 168 hours. Cardiac Enzymes: No results for input(s): "CKTOTAL", "CKMB", "CKMBINDEX", "TROPONINI" in the last 168 hours. BNP (last 3 results) No results for input(s): "PROBNP" in the last 8760 hours. HbA1C: Recent Labs    01/28/22 0319  HGBA1C 5.5   CBG: Recent Labs  Lab 01/27/22 1354  GLUCAP 77   Lipid Profile: Recent Labs    01/28/22 0319  CHOL 226*  HDL 35*  LDLCALC 140*  TRIG 253*  CHOLHDL 6.5   Thyroid Function Tests: No results for input(s): "TSH", "T4TOTAL", "FREET4", "T3FREE", "THYROIDAB" in the last 72 hours. Anemia Panel: No results for input(s): "VITAMINB12", "FOLATE", "FERRITIN", "TIBC", "IRON", "RETICCTPCT" in the last 72 hours. Sepsis Labs: No results for input(s): "PROCALCITON", "LATICACIDVEN" in the last 168 hours.  No results found for this or any previous visit (from the past 240 hour(s)).       Radiology Studies: ECHOCARDIOGRAM LIMITED  Result Date: 01/28/2022    ECHOCARDIOGRAM LIMITED REPORT   Patient Name:   Anita Fletcher Date of Exam: 01/28/2022 Medical Rec #:  [id number]     Height:       63.5 in Accession #:    [id number]    Weight:       168.0 lb Date of Birth:  [birth year]     BSA:          1.806 m Patient Age:    76 years      BP:           195/124 mmHg Patient Gender: F             HR:           67 bpm. [facility] Procedure: Limited Echo Indications:     Stroke  History:         Patient has prior history of Echocardiogram examinations, most                   recent 10/04/2021. Stroke, Arrythmias:Atrial Fibrillation; Risk                  Factors:Hypertension.  Sonographer:     Mikki Harbor Referring Phys:  [id number] [facility] Diagnosing Phys: Nona Dell MD IMPRESSIONS  1. Limited study.  2. Left ventricular ejection fraction, by estimation, is 70 to 75%. The left ventricle has hyperdynamic function. There is severe asymmetric left ventricular hypertrophy of the basal segment. Left ventricular diastolic function could not be evaluated.  3. The mitral valve is abnormal, mild to moderate annular calcification.  4. The aortic valve is tricuspid. Aortic valve sclerosis is present, with no evidence of aortic valve stenosis.  5. The inferior vena cava is  normal in size with greater than 50% respiratory variability, suggesting right atrial pressure of 3 mmHg. FINDINGS  Left Ventricle: Left ventricular ejection fraction, by estimation, is 70 to 75%. The left ventricle has hyperdynamic function. The left ventricular internal cavity size was normal in size. There is severe asymmetric left ventricular hypertrophy of the basal segment. Left ventricular diastolic function could not be evaluated. Pericardium: There is no evidence of pericardial effusion. Presence of epicardial fat layer. Mitral Valve: The mitral valve is abnormal. Mild to moderate mitral annular calcification. Tricuspid Valve: The tricuspid valve is grossly normal. Aortic Valve: The aortic valve is tricuspid. There is mild aortic valve annular calcification. Aortic valve sclerosis is present, with no evidence of aortic valve stenosis. Aorta: The aortic root is normal in size and structure. Venous: The inferior vena cava is normal in size with greater than 50% respiratory variability, suggesting right atrial pressure of 3 mmHg. IAS/Shunts: The interatrial septum was not assessed. LEFT VENTRICLE PLAX 2D LVIDd:         4.50 cm LVIDs:         2.50 cm LV PW:         1.50 cm LV IVS:        1.70  cm LVOT diam:     2.00 cm LVOT Area:     3.14 cm  LEFT ATRIUM         Index LA diam:    3.70 cm 2.05 cm/m   AORTA Ao Root diam: 2.90 cm Ao Asc diam:  3.60 cm  SHUNTS Systemic Diam: 2.00 cm Nona Dell MD Electronically signed by Nona Dell MD Signature Date/Time: 01/28/2022/12:19:22 PM    Final (Updated)    CT ANGIO HEAD NECK W WO CM  Result Date: 01/27/2022 CLINICAL DATA:  Stroke code right facial numbness/tingling EXAM: CT ANGIOGRAPHY HEAD AND NECK TECHNIQUE: Multidetector CT imaging of the head and neck was performed using the standard protocol during bolus administration of intravenous contrast. Multiplanar CT image reconstructions and MIPs were obtained to evaluate the vascular anatomy. Carotid stenosis measurements (when applicable) are obtained utilizing NASCET criteria, using the distal internal carotid diameter as the denominator. RADIATION DOSE REDUCTION: This exam was performed according to the departmental dose-optimization program which includes automated exposure control, adjustment of the mA and/or kV according to patient size and/or use of iterative reconstruction technique. CONTRAST:  110mL OMNIPAQUE IOHEXOL 300 MG/ML  SOLN COMPARISON:  Same-day CT brain FINDINGS: CT HEAD FINDINGS Brain: See same day brain. 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 Vascular: See below Skull: Normal. Negative for fracture or focal lesion. Sinuses/Orbits: Bilateral lens replacements. Other: None Review of the MIP images confirms the above findings CTA NECK FINDINGS Aortic arch: Right carotid system: Right common carotid and cervical ICAs are markedly tortuous. There is mild narrowing of the cavernous segment of the right ICA. Left carotid system: Left common carotid and cervical ICAs are markedly tortuous. There is mild narrowing of the left common carotid artery (series 6, image 229) secondary to soft atherosclerotic plaque. There is also mild narrowing of the origin  of the left ICA secondary to soft atherosclerotic plaque. There is mild narrowing of the cavernous segment of the left ICA. Vertebral arteries: There is mild-to-moderate narrowing of the origin of the right vertebral artery. There is moderate to severe narrowing in the distal V4 segment of the right vertebral artery (series 6, image 141). Skeleton: Multilevel degenerative changes with severe neural foraminal narrowing in the  upper and mid cervical spine. Other neck: Negative. Upper chest: Negative. Review of the MIP images confirms the above findings CTA HEAD FINDINGS Anterior circulation: There is moderate focal stenosis at the M1 segment of the right ICA (series 7, image 92). There is mild-to-moderate narrowing of the A1 segment of the left ACA (series 6, image 103). 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 The P2 segment of the right PCA is predominantly supplied by a small caliber PCOM which likely demonstrates at least mild-to-moderate stenosis at the distal portion (series 6, image 109). There is severe stenosis in the P2 segment of the left PCA (series 100, image 112). Venous sinuses: As permitted by contrast timing, patent. 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 No evidence of large vessel occlusion. Electronically Signed   By: Lorenza Cambridge M.D.   On: 01/27/2022 16:15   CT Head Wo Contrast  Result Date: 01/27/2022 CLINICAL DATA:  Right facial numbness beginning yesterday EXAM: CT HEAD WITHOUT CONTRAST TECHNIQUE: Contiguous axial images were obtained from the base of the skull through the vertex without  intravenous contrast. RADIATION DOSE REDUCTION: This exam was performed according to the departmental dose-optimization program which includes automated exposure control, adjustment of the mA and/or kV according to patient size and/or use of iterative reconstruction technique. COMPARISON:  CT head 07/05/2013 FINDINGS: Brain: There is no acute intracranial hemorrhage, extra-axial fluid collection, or acute large vessel territorial infarct. There is patchy hypodensity in the lentiform nuclei bilaterally, left more than right which could reflect acute or subacute infarct. Gray-white differentiation is otherwise preserved. Background parenchymal volume is normal. Gray-white differentiation is preserved. Ventricles are normal in size. Additional mild hypodensity in the supratentorial white matter likely reflects sequela of underlying chronic small vessel ischemic change There is no mass lesion.  There is no mass effect or midline shift. Vascular: There is calcification of the bilateral carotid siphons and vertebral arteries. Skull: Normal. Negative for fracture or focal lesion. Sinuses/Orbits: The imaged paranasal sinuses are clear. A right lens implant is noted. The globes and orbits are otherwise unremarkable. Other: None. IMPRESSION: 1. Patchy hypodensity in the lentiform nuclei bilaterally could reflect acute or subacute infarcts. Consider brain MRI for further evaluation. 2. No acute intracranial hemorrhage or large vessel territorial infarct. Electronically Signed   By: Lesia Hausen M.D.   On: 01/27/2022 14:25        Scheduled Meds:   stroke: early stages of recovery book   Does not apply Once   aspirin  81 mg Oral Daily   atorvastatin  40 mg Oral Daily   clopidogrel  75 mg Oral Daily   enoxaparin (LOVENOX) injection  40 mg Subcutaneous Q24H     LOS: 2 days    Time spent: 35 minutes    Marialice Newkirk Hoover Brunette, DO [facility]  If 7PM-7AM, please contact night-coverage [email or web address] 01/29/2022,  12:40 PM

## 2022-01-29 NOTE — ED Notes (Signed)
Pt appears restless and has periods of hyperventilation as pt expresses constant worry, SpO2 100% on RA--DO made aware of concerns for elevating levels of anxiety

## 2022-01-30 ENCOUNTER — Inpatient Hospital Stay (HOSPITAL_COMMUNITY): Payer: Medicare HMO

## 2022-01-30 ENCOUNTER — Inpatient Hospital Stay (HOSPITAL_COMMUNITY): Payer: Medicare HMO | Admitting: Certified Registered"

## 2022-01-30 ENCOUNTER — Encounter (HOSPITAL_COMMUNITY): Payer: Self-pay | Admitting: Internal Medicine

## 2022-01-30 ENCOUNTER — Encounter (HOSPITAL_COMMUNITY)
Admission: EM | Disposition: A | Discharge status: Rehabilitation Facility | Payer: Self-pay | Source: Home / Self Care | Attending: Neuroradiology

## 2022-01-30 DIAGNOSIS — I639 Cerebral infarction, unspecified: Secondary | ICD-10-CM | POA: Diagnosis not present

## 2022-01-30 DIAGNOSIS — I6350 Cerebral infarction due to unspecified occlusion or stenosis of unspecified cerebral artery: Secondary | ICD-10-CM

## 2022-01-30 DIAGNOSIS — I10 Essential (primary) hypertension: Secondary | ICD-10-CM | POA: Diagnosis not present

## 2022-01-30 HISTORY — PX: IR ANGIO VERTEBRAL SEL VERTEBRAL UNI L MOD SED: IMG5367

## 2022-01-30 HISTORY — PX: IR ANGIO INTRA EXTRACRAN SEL COM CAROTID INNOMINATE BILAT MOD SED: IMG5360

## 2022-01-30 HISTORY — PX: RADIOLOGY WITH ANESTHESIA: SHX6223

## 2022-01-30 HISTORY — PX: IR US GUIDE VASC ACCESS RIGHT: IMG2390

## 2022-01-30 LAB — PROTIME-INR
INR: 1.1 (ref 0.8–1.2)
Prothrombin Time: 13.7 seconds (ref 11.4–15.2)

## 2022-01-30 SURGERY — MRI WITH ANESTHESIA
Anesthesia: General

## 2022-01-30 MED ORDER — FENTANYL CITRATE (PF) 100 MCG/2ML IJ SOLN
INTRAMUSCULAR | Status: AC
  Filled 2022-01-30: qty 2

## 2022-01-30 MED ORDER — CLOPIDOGREL BISULFATE 75 MG PO TABS
75.0000 mg | ORAL_TABLET | Freq: Once | ORAL | Status: AC
  Administered 2022-01-30: 75 mg via ORAL
  Filled 2022-01-30: qty 1

## 2022-01-30 MED ORDER — HYDRALAZINE HCL 20 MG/ML IJ SOLN
INTRAMUSCULAR | Status: DC | PRN
  Administered 2022-01-30: 5 mg via INTRAVENOUS
  Administered 2022-01-30: 10 mg via INTRAVENOUS
  Administered 2022-01-30: 5 mg via INTRAVENOUS

## 2022-01-30 MED ORDER — LIDOCAINE HCL 1 % IJ SOLN
INTRAMUSCULAR | Status: AC
  Administered 2022-01-30: 10 mL via INTRADERMAL
  Filled 2022-01-30: qty 20

## 2022-01-30 MED ORDER — MIDAZOLAM HCL 2 MG/2ML IJ SOLN
INTRAMUSCULAR | Status: AC
  Filled 2022-01-30: qty 2

## 2022-01-30 MED ORDER — LACTATED RINGERS IV SOLN: INTRAVENOUS | Status: DC

## 2022-01-30 MED ORDER — LACTATED RINGERS IV SOLN: INTRAVENOUS | Status: DC | PRN

## 2022-01-30 MED ORDER — MIDAZOLAM HCL 2 MG/2ML IJ SOLN
INTRAMUSCULAR | Status: DC | PRN
  Administered 2022-01-30: .5 mg via INTRAVENOUS

## 2022-01-30 MED ORDER — HYDRALAZINE HCL 20 MG/ML IJ SOLN
INTRAMUSCULAR | Status: AC
  Filled 2022-01-30: qty 1

## 2022-01-30 MED ORDER — MIDAZOLAM HCL 5 MG/5ML IJ SOLN
INTRAMUSCULAR | Status: DC | PRN
  Administered 2022-01-30: .5 mg via INTRAVENOUS

## 2022-01-30 MED ORDER — FENTANYL CITRATE (PF) 100 MCG/2ML IJ SOLN
INTRAMUSCULAR | Status: DC | PRN
  Administered 2022-01-30: 50 ug via INTRAVENOUS

## 2022-01-30 MED ORDER — ORAL CARE MOUTH RINSE: 15.0000 mL | Freq: Once | OROMUCOSAL | Status: AC

## 2022-01-30 MED ORDER — PHENYLEPHRINE 80 MCG/ML (10ML) SYRINGE FOR IV PUSH (FOR BLOOD PRESSURE SUPPORT)
PREFILLED_SYRINGE | INTRAVENOUS | Status: DC | PRN
  Administered 2022-01-30: 160 ug via INTRAVENOUS
  Administered 2022-01-30: 80 ug via INTRAVENOUS
  Administered 2022-01-30: 160 ug via INTRAVENOUS

## 2022-01-30 MED ORDER — CHLORHEXIDINE GLUCONATE 0.12 % MT SOLN
15.0000 mL | Freq: Once | OROMUCOSAL | Status: AC
  Administered 2022-01-30: 15 mL via OROMUCOSAL
  Filled 2022-01-30: qty 15

## 2022-01-30 MED ORDER — LIDOCAINE HCL 1 % IJ SOLN
10.0000 mL | Freq: Once | INTRAMUSCULAR | Status: AC
  Filled 2022-01-30: qty 10

## 2022-01-30 MED ORDER — HYDRALAZINE HCL 20 MG/ML IJ SOLN
5.0000 mg | Freq: Once | INTRAMUSCULAR | Status: AC
  Administered 2022-01-30: 5 mg via INTRAVENOUS

## 2022-01-30 MED ORDER — PROPOFOL 10 MG/ML IV BOLUS
INTRAVENOUS | Status: DC | PRN
  Administered 2022-01-30: 150 mg via INTRAVENOUS

## 2022-01-30 MED ORDER — SODIUM CHLORIDE 0.9 % IV SOLN: INTRAVENOUS | Status: DC

## 2022-01-30 MED ORDER — LIDOCAINE 2% (20 MG/ML) 5 ML SYRINGE
INTRAMUSCULAR | Status: DC | PRN
  Administered 2022-01-30: 100 mg via INTRAVENOUS

## 2022-01-30 NOTE — Progress Notes (Signed)
PROGRESS NOTE    Anita Fletcher  NGE:[id number] DOB: Feb 09, 1946 DOA: 01/27/2022 PCP: Patient, No Pcp Per   Brief Narrative:    76 year old female with a history of paroxysmal atrial fibrillation on anticoagulation, hypertension, prior history of TIA, presents to the hospital with right arm, right face, right leg numbness.  CT head indicated possible CVA.  She is being admitted to [facility] for further evaluation by neurology and MRI brain. Anticipating transfer today and Neurology team aware.  Assessment & Plan:   Principal Problem:   Acute CVA (cerebrovascular accident) [facility]) Active Problems:   Paroxysmal atrial fibrillation (HCC)   HTN (hypertension)  Assessment and Plan:   Acute CVA -Confirmed on MRI that required sedation today 01-30-22 -Neurology following, appreciate insight recommendations -Pending vascular study at this time to rule out carotid artery stenosis -Right sided numbness improving but not yet resolved -PT OT to evaluate, patient has been off the floor today for imaging and procedures -Disposition pending above   Hypertension -Resume home medications in the next 24 hours   Paroxysmal atrial fibrillation -Currently in sinus rhythm -Reported compliance with Eliquis prior to admission -Currently on aspirin and Plavix statin -likely to resume Eliquis and possibly low-dose aspirin per neurology   Claustrophobia -Severe claustrophobia at baseline, has difficulty even riding in elevators -Xanax ordered as needed for anxiety -MRI completed with sedation, appreciate radiology and anesthesia's assistance     DVT prophylaxis: enoxaparin   Code Status: Full code Family Communication: Discussed with patient Disposition Plan: Status is: Inpatient Remains inpatient appropriate because: Continued work-up in the setting of acute stroke   Consultants:  Neurology   Antimicrobials:   None    Subjective: Patient evaluated after discussion with radiology  regarding carotid artery study, patient markedly anxious reporting some previous friend having suddenly died after imaging which we discussed with extremely rare.  Regardless she is finally agreeable for MRI with sedation as well as vascular study which we discussed with necessary for proper diagnosis as this is her main concern for being hospitalized.  Objective: Vitals:   01/29/22 1421 01/29/22 2000 01/29/22 2352 01/30/22 0311  BP: (!) 167/89 (!) 159/92 (!) 154/82 (!) 144/79  Pulse: 80 72 64 64  Resp: 20 18 16 17   Temp: 97.8 F (36.6 C) (!) 97.5 F (36.4 C) 98 F (36.7 C) 98.3 F (36.8 C)  TempSrc: Oral Oral Oral Oral  SpO2: 95% 97% 96% 95%  Weight:      Height:        Intake/Output Summary (Last 24 hours) at 01/30/2022 13/05/2021 Last data filed at 01/29/2022 1500 Gross per 24 hour  Intake 240 ml  Output --  Net 240 ml   Filed Weights   01/27/22 1300  Weight: 76.2 kg    Examination:  General: No acute distress but clearly anxious about imaging and procedure HEENT:  Normocephalic atraumatic.  Sclerae nonicteric, noninjected.  Extraocular movements intact bilaterally. Neck:  Without mass or deformity.  Trachea is midline. Lungs:  Clear to auscultate bilaterally without rhonchi, wheeze, or rales. Heart: Tachycardic but regular rhythm no murmurs rubs or gallops Abdomen:  Soft, nontender, nondistended.  Without guarding or rebound. Extremities: Without cyanosis, clubbing, edema, or obvious deformity. Vascular:  Dorsalis pedis and posterior tibial pulses palpable bilaterally. Skin:  Warm and dry, no erythema, no ulcerations.   Data Reviewed: I have personally reviewed following labs and imaging studies  CBC: Recent Labs  Lab 01/27/22 1352  WBC 7.1  NEUTROABS 4.4  HGB 14.9  HCT 44.4  MCV 92.7  PLT 614    Basic Metabolic Panel: Recent Labs  Lab 01/27/22 1352  NA 141  K 3.6  CL 107  CO2 26  GLUCOSE 82  BUN 11  CREATININE 0.76  CALCIUM 9.0    GFR: Estimated  Creatinine Clearance: 61 mL/min (by C-G formula based on SCr of 0.76 mg/dL). Liver Function Tests: Recent Labs  Lab 01/27/22 1352  AST 14*  ALT 15  ALKPHOS 56  BILITOT 0.5  PROT 6.8  ALBUMIN 3.8    No results for input(s): "LIPASE", "AMYLASE" in the last 168 hours. No results for input(s): "AMMONIA" in the last 168 hours. Coagulation Profile: Recent Labs  Lab 01/30/22 0354  INR 1.1   Cardiac Enzymes: No results for input(s): "CKTOTAL", "CKMB", "CKMBINDEX", "TROPONINI" in the last 168 hours. BNP (last 3 results) No results for input(s): "PROBNP" in the last 8760 hours. HbA1C: Recent Labs    01/28/22 0319  HGBA1C 5.5    CBG: Recent Labs  Lab 01/27/22 1354  GLUCAP 77    Lipid Profile: Recent Labs    01/28/22 0319  CHOL 226*  HDL 35*  LDLCALC 140*  TRIG 253*  CHOLHDL 6.5    Thyroid Function Tests: No results for input(s): "TSH", "T4TOTAL", "FREET4", "T3FREE", "THYROIDAB" in the last 72 hours. Anemia Panel: No results for input(s): "VITAMINB12", "FOLATE", "FERRITIN", "TIBC", "IRON", "RETICCTPCT" in the last 72 hours. Sepsis Labs: No results for input(s): "PROCALCITON", "LATICACIDVEN" in the last 168 hours.  No results found for this or any previous visit (from the past 240 hour(s)).       Radiology Studies: ECHOCARDIOGRAM LIMITED  Result Date: 01/28/2022    ECHOCARDIOGRAM LIMITED REPORT   Patient Name:   Anita Fletcher Date of Exam: 01/28/2022 Medical Rec #:  [id number]     Height:       63.5 in Accession #:    [id number]    Weight:       168.0 lb Date of Birth:  July 05, 1945     BSA:          1.806 m Patient Age:    57 years      BP:           195/124 mmHg Patient Gender: F             HR:           67 bpm. Exam Location:  [facility] Procedure: Limited Echo Indications:     Stroke  History:         Patient has prior history of Echocardiogram examinations, most                  recent 10/04/2021. Stroke, Arrythmias:Atrial Fibrillation; Risk                   Factors:Hypertension.  Sonographer:     Wenda Low Referring Phys:  [place] Diagnosing Phys: Rozann Lesches MD IMPRESSIONS  1. Limited study.  2. Left ventricular ejection fraction, by estimation, is 70 to 75%. The left ventricle has hyperdynamic function. There is severe asymmetric left ventricular hypertrophy of the basal segment. Left ventricular diastolic function could not be evaluated.  3. The mitral valve is abnormal, mild to moderate annular calcification.  4. The aortic valve is tricuspid. Aortic valve sclerosis is present, with no evidence of aortic valve stenosis.  5. The inferior vena cava is normal in size with greater than 50% respiratory variability, suggesting right  atrial pressure of 3 mmHg. FINDINGS  Left Ventricle: Left ventricular ejection fraction, by estimation, is 70 to 75%. The left ventricle has hyperdynamic function. The left ventricular internal cavity size was normal in size. There is severe asymmetric left ventricular hypertrophy of the basal segment. Left ventricular diastolic function could not be evaluated. Pericardium: There is no evidence of pericardial effusion. Presence of epicardial fat layer. Mitral Valve: The mitral valve is abnormal. Mild to moderate mitral annular calcification. Tricuspid Valve: The tricuspid valve is grossly normal. Aortic Valve: The aortic valve is tricuspid. There is mild aortic valve annular calcification. Aortic valve sclerosis is present, with no evidence of aortic valve stenosis. Aorta: The aortic root is normal in size and structure. Venous: The inferior vena cava is normal in size with greater than 50% respiratory variability, suggesting right atrial pressure of 3 mmHg. IAS/Shunts: The interatrial septum was not assessed. LEFT VENTRICLE PLAX 2D LVIDd:         4.50 cm LVIDs:         2.50 cm LV PW:         1.50 cm LV IVS:        1.70 cm LVOT diam:     2.00 cm LVOT Area:     3.14 cm  LEFT ATRIUM         Index LA diam:    3.70  cm 2.05 cm/m   AORTA Ao Root diam: 2.90 cm Ao Asc diam:  3.60 cm  SHUNTS Systemic Diam: 2.00 cm Nona Dell MD Electronically signed by Nona Dell MD Signature Date/Time: 01/28/2022/12:19:22 PM    Final (Updated)         Scheduled Meds:   stroke: early stages of recovery book   Does not apply Once   aspirin  81 mg Oral Daily   atorvastatin  40 mg Oral Daily   clopidogrel  75 mg Oral Daily   enoxaparin (LOVENOX) injection  40 mg Subcutaneous Q24H     LOS: 3 days    Time spent: 35 minutes    Azucena Fallen, DO [facility]  If 7PM-7AM, please contact night-coverage [email or web address] 01/30/2022, 8:12 AM

## 2022-01-30 NOTE — Progress Notes (Signed)
Pt returned from IR at this time.  Alert and oriented.  Groin dressing clean, dry and intact. Pulse +2.

## 2022-01-30 NOTE — Anesthesia Preprocedure Evaluation (Addendum)
Anesthesia Evaluation  Patient identified by MRN, date of birth, ID band Patient awake  General Assessment Comment:In Preop, Dr. Audie Box called and agreed to proceed with surgery as well as patient talked on phone  to Dr. Avon Gully. Dr. Nyoka Cowden  Airway Mallampati: II       Dental   Pulmonary neg pulmonary ROS   breath sounds clear to auscultation       Cardiovascular hypertension,  Rhythm:Regular Rate:Normal     Neuro/Psych History noted Dr. Mallie Snooks    GI/Hepatic negative GI ROS, Neg liver ROS,,,  Endo/Other  negative endocrine ROS    Renal/GU negative Renal ROS     Musculoskeletal   Abdominal   Peds  Hematology   Anesthesia Other Findings   Reproductive/Obstetrics                             Anesthesia Physical Anesthesia Plan  ASA: 3  Anesthesia Plan: General   Post-op Pain Management: Tylenol PO (pre-op)*   Induction:   PONV Risk Score and Plan: 3 and Ondansetron, Dexamethasone and Midazolam  Airway Management Planned: LMA  Additional Equipment:   Intra-op Plan:   Post-operative Plan: Extubation in OR  Informed Consent: I have reviewed the patients History and Physical, chart, labs and discussed the procedure including the risks, benefits and alternatives for the proposed anesthesia with the patient or authorized representative who has indicated his/her understanding and acceptance.     Dental advisory given  Plan Discussed with: CRNA and Anesthesiologist  Anesthesia Plan Comments:        Anesthesia Quick Evaluation

## 2022-01-30 NOTE — Anesthesia Procedure Notes (Signed)
Procedure Name: LMA Insertion Date/Time: 01/30/2022 1:28 PM  Performed by: Thelma Comp, CRNAPre-anesthesia Checklist: Patient identified, Emergency Drugs available, Suction available and Patient being monitored Patient Re-evaluated:Patient Re-evaluated prior to induction Oxygen Delivery Method: Circle System Utilized Preoxygenation: Pre-oxygenation with 100% oxygen Induction Type: IV induction Ventilation: Mask ventilation without difficulty LMA: LMA inserted LMA Size: 4.0 Number of attempts: 1 Airway Equipment and Method: Bite block Placement Confirmation: positive ETCO2 Tube secured with: Tape Dental Injury: Teeth and Oropharynx as per pre-operative assessment

## 2022-01-30 NOTE — Consult Note (Signed)
Chief Complaint: Patient was seen in consultation today for symptomatic intracranial stenosis  Referring Physician(s): Lennie Hummer, PA-C  Supervising Physician: Pedro Earls  Patient Status: [facility] - In-pt  History of Present Illness: Anita Fletcher is a 76 y.o. female with PMH significant for HTN, atrial fibrillation, and TIA 10 years ago being seen today for symptomatic intracranial stenosis. The patient states that beginning on Sunday, 10/29 she has been having intermittent right sided numbness in her arms and legs which have lasted several minutes. The patient reports waking up the evening of 10/29 with numbness that has persisted until this time. She denies any muscle weakness, but endorses some difficulty with coordination, especially when walking. Patient was transferred from [facility] to [facility], and NIR has been consulted for a diagnostic angiogram.   Past Medical History:  Diagnosis Date   Acute CVA (cerebrovascular accident) ([place]) 01/27/2022   HTN (hypertension) 01/27/2022   TIA (transient ischemic attack)     Past Surgical History:  Procedure Laterality Date   EYE SURGERY      Allergies: Codeine  Medications: Prior to Admission medications   Medication Sig Start Date End Date Taking? Authorizing Provider  apixaban (ELIQUIS) 5 MG TABS tablet Take 1 tablet (5 mg total) by mouth 2 (two) times daily. 09/23/21  Yes O'Neal, Cassie Freer, MD  losartan (COZAAR) 50 MG tablet Take 1 tablet (50 mg total) by mouth daily. 01/16/22 01/11/23 Yes O'Neal, Cassie Freer, MD  metoprolol tartrate (LOPRESSOR) 25 MG tablet Take 1 tablet (25 mg total) by mouth 2 (two) times daily. 09/24/21  Yes Minus Breeding, MD     Family History  Problem Relation Age of Onset   Arrhythmia Brother     Social History   Socioeconomic History   Marital status: Single    Spouse name: Not on file   Number of children: 0   Years of education: Not on file   Highest education  level: Not on file  Occupational History   Occupation: Resume building  Tobacco Use   Smoking status: Never   Smokeless tobacco: Not on file  Substance and Sexual Activity   Alcohol use: No   Drug use: No   Sexual activity: Not on file  Other Topics Concern   Not on file  Social History Narrative   Not on file   Social Determinants of Health   Financial Resource Strain: Not on file  Food Insecurity: No Food Insecurity (01/29/2022)   Hunger Vital Sign    Worried About Running Out of Food in the Last Year: Never true    Ran Out of Food in the Last Year: Never true  Transportation Needs: No Transportation Needs (01/29/2022)   PRAPARE - Hydrologist (Medical): No    Lack of Transportation (Non-Medical): No  Physical Activity: Not on file  Stress: Not on file  Social Connections: Not on file     Review of Systems: A 12 point ROS discussed and pertinent positives are indicated in the HPI above.  All other systems are negative.  Review of Systems  Constitutional:  Negative for chills and fever.  Respiratory:  Negative for chest tightness and shortness of breath.   Cardiovascular:  Negative for chest pain and leg swelling.  Gastrointestinal:  Negative for diarrhea, nausea and vomiting.  Neurological:  Positive for numbness. Negative for dizziness, light-headedness and headaches.  Psychiatric/Behavioral:  Negative for confusion.     Vital Signs: BP (!) 175/91 (BP  Location: Left Arm)   Pulse 63   Temp 97.8 F (36.6 C) (Oral)   Resp 16   Ht 5' 3.5" (1.613 m)   Wt 168 lb (76.2 kg)   SpO2 95%   BMI 29.29 kg/m    Physical Exam Vitals reviewed.  Constitutional:      General: She is not in acute distress.    Appearance: She is not ill-appearing.  HENT:     Mouth/Throat:     Mouth: Mucous membranes are moist.  Cardiovascular:     Rate and Rhythm: Normal rate and regular rhythm.     Pulses: Normal pulses.     Heart sounds: Normal heart  sounds.  Pulmonary:     Effort: Pulmonary effort is normal.     Breath sounds: Normal breath sounds.  Abdominal:     General: Bowel sounds are normal.     Palpations: Abdomen is soft.     Tenderness: There is no abdominal tenderness.  Musculoskeletal:     Right lower leg: No edema.     Left lower leg: No edema.  Skin:    General: Skin is warm and dry.  Neurological:     Mental Status: She is alert and oriented to person, place, and time.     Sensory: Sensory deficit present.     Comments: Impaired sensation to light touch on right upper and right lower extremities  Psychiatric:        Mood and Affect: Mood normal.        Behavior: Behavior normal.     Imaging: ECHOCARDIOGRAM LIMITED  Result Date: 01/28/2022    ECHOCARDIOGRAM LIMITED REPORT   Patient Name:   Anita Fletcher Date of Exam: 01/28/2022 Medical Rec #:  [id number]     Height:       63.5 in Accession #:    [id number]    Weight:       168.0 lb Date of Birth:  [birth year]     BSA:          1.806 m Patient Age:    76 years      BP:           195/124 mmHg Patient Gender: F             HR:           67 bpm. [facility] Procedure: Limited Echo Indications:     Stroke  History:         Patient has prior history of Echocardiogram examinations, most                  recent 10/04/2021. Stroke, Arrythmias:Atrial Fibrillation; Risk                  Factors:Hypertension.  Sonographer:     Mikki Harbororothy Buchanan Referring Phys:  [id number] Heloise BeechamEJIROGHENE [facility] Diagnosing Phys: Nona DellSamuel Mcdowell MD IMPRESSIONS  1. Limited study.  2. Left ventricular ejection fraction, by estimation, is 70 to 75%. The left ventricle has hyperdynamic function. There is severe asymmetric left ventricular hypertrophy of the basal segment. Left ventricular diastolic function could not be evaluated.  3. The mitral valve is abnormal, mild to moderate annular calcification.  4. The aortic valve is tricuspid. Aortic valve sclerosis is present, with no evidence of aortic valve  stenosis.  5. The inferior vena cava is normal in size with greater than 50% respiratory variability, suggesting right atrial pressure of 3 mmHg. FINDINGS  Left Ventricle: Left  ventricular ejection fraction, by estimation, is 70 to 75%. The left ventricle has hyperdynamic function. The left ventricular internal cavity size was normal in size. There is severe asymmetric left ventricular hypertrophy of the basal segment. Left ventricular diastolic function could not be evaluated. Pericardium: There is no evidence of pericardial effusion. Presence of epicardial fat layer. Mitral Valve: The mitral valve is abnormal. Mild to moderate mitral annular calcification. Tricuspid Valve: The tricuspid valve is grossly normal. Aortic Valve: The aortic valve is tricuspid. There is mild aortic valve annular calcification. Aortic valve sclerosis is present, with no evidence of aortic valve stenosis. Aorta: The aortic root is normal in size and structure. Venous: The inferior vena cava is normal in size with greater than 50% respiratory variability, suggesting right atrial pressure of 3 mmHg. IAS/Shunts: The interatrial septum was not assessed. LEFT VENTRICLE PLAX 2D LVIDd:         4.50 cm LVIDs:         2.50 cm LV PW:         1.50 cm LV IVS:        1.70 cm LVOT diam:     2.00 cm LVOT Area:     3.14 cm  LEFT ATRIUM         Index LA diam:    3.70 cm 2.05 cm/m   AORTA Ao Root diam: 2.90 cm Ao Asc diam:  3.60 cm  SHUNTS Systemic Diam: 2.00 cm Nona Dell MD Electronically signed by Nona Dell MD Signature Date/Time: 01/28/2022/12:19:22 PM    Final (Updated)    CT ANGIO HEAD NECK W WO CM  Result Date: 01/27/2022 CLINICAL DATA:  Stroke code right facial numbness/tingling EXAM: CT ANGIOGRAPHY HEAD AND NECK TECHNIQUE: Multidetector CT imaging of the head and neck was performed using the standard protocol during bolus administration of intravenous contrast. Multiplanar CT image reconstructions and MIPs were obtained to  evaluate the vascular anatomy. Carotid stenosis measurements (when applicable) are obtained utilizing NASCET criteria, using the distal internal carotid diameter as the denominator. RADIATION DOSE REDUCTION: This exam was performed according to the departmental dose-optimization program which includes automated exposure control, adjustment of the mA and/or kV according to patient size and/or use of iterative reconstruction technique. CONTRAST:  62mL OMNIPAQUE IOHEXOL 300 MG/ML  SOLN COMPARISON:  Same-day CT brain FINDINGS: CT HEAD FINDINGS Brain: See same day brain. 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 Vascular: See below Skull: Normal. Negative for fracture or focal lesion. Sinuses/Orbits: Bilateral lens replacements. Other: None Review of the MIP images confirms the above findings CTA NECK FINDINGS Aortic arch: Right carotid system: Right common carotid and cervical ICAs are markedly tortuous. There is mild narrowing of the cavernous segment of the right ICA. Left carotid system: Left common carotid and cervical ICAs are markedly tortuous. There is mild narrowing of the left common carotid artery (series 6, image 229) secondary to soft atherosclerotic plaque. There is also mild narrowing of the origin of the left ICA secondary to soft atherosclerotic plaque. There is mild narrowing of the cavernous segment of the left ICA. Vertebral arteries: There is mild-to-moderate narrowing of the origin of the right vertebral artery. There is moderate to severe narrowing in the distal V4 segment of the right vertebral artery (series 6, image 141). Skeleton: Multilevel degenerative changes with severe neural foraminal narrowing in the upper and mid cervical spine. Other neck: Negative. Upper chest: Negative. Review of the MIP images confirms the above findings CTA HEAD  FINDINGS Anterior circulation: There is moderate focal stenosis at the M1 segment of the right ICA (series 7, image 92).  There is mild-to-moderate narrowing of the A1 segment of the left ACA (series 6, image 103). 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 The P2 segment of the right PCA is predominantly supplied by a small caliber PCOM which likely demonstrates at least mild-to-moderate stenosis at the distal portion (series 6, image 109). There is severe stenosis in the P2 segment of the left PCA (series 100, image 112). Venous sinuses: As permitted by contrast timing, patent. 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 No evidence of large vessel occlusion. Electronically Signed   By: Lorenza Cambridge M.D.   On: 01/27/2022 16:15   CT Head Wo Contrast  Result Date: 01/27/2022 CLINICAL DATA:  Right facial numbness beginning yesterday EXAM: CT HEAD WITHOUT CONTRAST TECHNIQUE: Contiguous axial images were obtained from the base of the skull through the vertex without intravenous contrast. RADIATION DOSE REDUCTION: This exam was performed according to the departmental dose-optimization program which includes automated exposure control, adjustment of the mA and/or kV according to patient size and/or use of iterative reconstruction technique. COMPARISON:  CT head 07/05/2013 FINDINGS: Brain: There is no acute intracranial hemorrhage, extra-axial fluid collection, or acute large vessel territorial infarct. There is patchy hypodensity in the lentiform nuclei bilaterally, left more than right which could reflect acute or subacute infarct. Gray-white differentiation is otherwise preserved. Background parenchymal volume is normal. Gray-white  differentiation is preserved. Ventricles are normal in size. Additional mild hypodensity in the supratentorial white matter likely reflects sequela of underlying chronic small vessel ischemic change There is no mass lesion.  There is no mass effect or midline shift. Vascular: There is calcification of the bilateral carotid siphons and vertebral arteries. Skull: Normal. Negative for fracture or focal lesion. Sinuses/Orbits: The imaged paranasal sinuses are clear. A right lens implant is noted. The globes and orbits are otherwise unremarkable. Other: None. IMPRESSION: 1. Patchy hypodensity in the lentiform nuclei bilaterally could reflect acute or subacute infarcts. Consider brain MRI for further evaluation. 2. No acute intracranial hemorrhage or large vessel territorial infarct. Electronically Signed   By: Lesia Hausen M.D.   On: 01/27/2022 14:25    Labs:  CBC: Recent Labs    08/24/21 1507 01/27/22 1352  WBC 9.4 7.1  HGB 16.3* 14.9  HCT 48.6* 44.4  PLT 276 253    COAGS: Recent Labs    01/30/22 0354  INR 1.1    BMP: Recent Labs    08/24/21 1507 01/27/22 1352  NA 141 141  K 3.6 3.6  CL 109 107  CO2 26 26  GLUCOSE 156* 82  BUN 11 11  CALCIUM 8.9 9.0  CREATININE 0.84 0.76  GFRNONAA >60 >60    LIVER FUNCTION TESTS: Recent Labs    08/24/21 1507 01/27/22 1352  BILITOT 0.6 0.5  AST 20 14*  ALT 15 15  ALKPHOS 58 56  PROT 6.8 6.8  ALBUMIN 3.9 3.8    TUMOR MARKERS: No results for input(s): "AFPTM", "CEA", "CA199", "CHROMGRNA" in the last 8760 hours.  Assessment and Plan:  Anita Fletcher is  a 76 yo female with PMH significant for HTN, atrial fibrillation, and TIA 10 years ago being seen today for symptomatic intracranial stenosis. A request was received for a diagnostic cerebral angiogram. The case has been reviewed and approved by Dr. Salvadore Dom tentatively for 11/2. The patient is NPO.  Risks and benefits of diagnostic cerebral angiogram were discussed with the  patient including, but not limited to bleeding, hematoma, pseudoaneurysm, infection, stroke, vascular injury or contrast induced renal failure.  This interventional procedure involves the use of X-rays and because of the nature of the planned procedure, it is possible that we will have prolonged use of X-ray fluoroscopy.  Potential radiation risks to you include (but are not limited to) the following: - A slightly elevated risk for cancer  several years later in life. This risk is typically less than 0.5% percent. This risk is low in comparison to the normal incidence of human cancer, which is 33% for women and 50% for men according to the [employer] Society. - Radiation induced injury can include skin redness, resembling a rash, tissue breakdown / ulcers and hair loss (which can be temporary or permanent).   The likelihood of either of these occurring depends on the difficulty of the procedure and whether you are sensitive to radiation due to previous procedures, disease, or genetic conditions.   IF your procedure requires a prolonged use of radiation, you will be notified and given written instructions for further action.  It is your responsibility to monitor the irradiated area for the 2 weeks following the procedure and to notify your physician if you are concerned that you have suffered a radiation induced injury.    All of the patient's questions were answered, patient is agreeable to proceed.  Consent signed and in IR suite.    Thank you for this interesting consult.  I greatly enjoyed meeting Anita Fletcher and look forward to participating in their care.  A copy of this report was sent to the requesting provider on this date.  Electronically Signed: Kennieth Francois, PA-C 01/30/2022, 11:05 AM   I spent a total of 40 Minutes    in face to face in clinical consultation, greater than 50% of which was counseling/coordinating care for symptomatic intracranial stenosis.

## 2022-01-30 NOTE — Procedures (Signed)
INTERVENTIONAL NEURORADIOLOGY BRIEF POSTPROCEDURE NOTE  DIAGNOSTIC CEREBRAL ANGIOGRAM    Attending: Dr. Pedro Earls    Diagnosis: Intracranial atherosclerotic disease    Access site: Right common femoral    Access closure: Perclose ProGlide    Anesthesia: Moderate sedation    Medication used: 0.5 mg Versed IV; 50 mcg Fentanyl IV head; 10 mg of hydralazine IV.   Complications: None.    Estimated blood loss: None.    Specimen: None.    Findings: Extreme tortuosity of the major neck arteries.  Severe tortuosity of the proximal basilar artery resulting in 85% stenosis.  Ulcerated plaque in the left carotid bulb.  No significant stenosis.  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    Findings will be discussed with attending neurologist for management planning.

## 2022-01-30 NOTE — Progress Notes (Signed)
Report called to Short Stay at [id number].

## 2022-01-30 NOTE — Anesthesia Postprocedure Evaluation (Signed)
Anesthesia Post Note  Patient: Anita Fletcher  Procedure(s) Performed: MRI WITH ANESTHESIA - BRAIN W/O CONSTRAST     Patient location during evaluation: PACU Anesthesia Type: General Level of consciousness: awake Pain management: pain level controlled Vital Signs Assessment: post-procedure vital signs reviewed and stable Respiratory status: spontaneous breathing Cardiovascular status: stable Postop Assessment: no apparent nausea or vomiting Anesthetic complications: no   No notable events documented.  Last Vitals:  Vitals:   01/30/22 1420 01/30/22 1430  BP: (!) 202/107 (!) 164/87  Pulse: 67 69  Resp: 20 (!) 22  Temp: 36.7 C   SpO2: 97% 97%    Last Pain:  Vitals:   01/30/22 1420  TempSrc:   PainSc: Asleep                 Newman Waren

## 2022-01-30 NOTE — Transfer of Care (Signed)
Immediate Anesthesia Transfer of Care Note  Patient: Anita Fletcher  Procedure(s) Performed: MRI WITH ANESTHESIA - BRAIN W/O CONSTRAST  Patient Location: PACU  Anesthesia Type:General  Level of Consciousness: drowsy and patient cooperative  Airway & Oxygen Therapy: Patient Spontanous Breathing  Post-op Assessment: Report given to RN and Post -op Vital signs reviewed and stable  Post vital signs: Reviewed and stable  Last Vitals:  Vitals Value Taken Time  BP 202/107 01/30/22 1420  Temp 36.7 C 01/30/22 1420  Pulse 66 01/30/22 1421  Resp 22 01/30/22 1421  SpO2 97 % 01/30/22 1421  Vitals shown include unvalidated device data. [facility] Green notified of pt BP, order for PACU RN to admin 5mg  hydralazine.  Last Pain:  Vitals:   01/30/22 1420  TempSrc:   PainSc: Asleep      Patients Stated Pain Goal: 0 (54/98/26 4158)  Complications: No notable events documented.

## 2022-01-30 NOTE — Care Management Important Message (Signed)
Important Message  Patient Details  Name: Anita Fletcher MRN: [id number] Date of Birth: 09/16/1945   Medicare Important Message Given:  Yes     Orbie Pyo 01/30/2022, 2:05 PM

## 2022-01-30 NOTE — Progress Notes (Addendum)
STROKE TEAM PROGRESS NOTE   ATTENDING NOTE: I reviewed above note and agree with the assessment and plan. Pt was seen and examined.   76 year old female with history of hypertension, A-fib on Eliquis, TIA in the past with right face numbness admitted for 3 episodes of right face and arm numbness.  CT no acute abnormality.  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  EF 70 to 75%.  MRI showed left lateral thalamic infarct.  LDL 140, TG 253, A1c 5.5.  Creatinine 0.76.  Had a cerebral angiogram today showed severe proximal basilar artery stenosis, bilateral fetal PCAs.  Etiology for patient stroke not quite clear, concerning for small vessel disease.  However patient does have proximal basilar artery high-grade stenosis.  Given bilateral fetal PCAs, the basilar artery stenosis may not be responsible for current stroke.  However, given the severity, will discuss with patient regarding basilar artery stenting, possible scheduled tomorrow.  Continue hold off Eliquis for now given potential procedure.  Continue aspirin Plavix and Lipitor 40.  Discussed with Dr. Ceasar Mons.   For detailed assessment and plan, please refer to above/below as I have made changes wherever appropriate.   Anita Hawking, MD PhD Stroke Neurology 01/30/2022 7:32 PM    INTERVAL HISTORY Her family  is not at the bedside.   Patient is some what sleepy and just arrived to room from MRI and cerebral angiogram She is c/o back pain and not happy that she will be laying flat for 4 hours She is aware of being @ [facility] for her right sided numbness  Vitals:   01/30/22 1635 01/30/22 1645 01/30/22 1700 01/30/22 1715  BP: (!) 146/72 (!) 141/72 132/69 139/73  Pulse: 72 75 77 78  Resp: 20     Temp:  97.9 F (36.6 C)    TempSrc:  Oral    SpO2: 97% 97% 98% 96%  Weight:      Height:       CBC:  Recent Labs  Lab 01/27/22 1352  WBC 7.1  NEUTROABS 4.4  HGB 14.9  HCT 44.4  MCV 92.7  PLT 123456   Basic Metabolic  Panel:  Recent Labs  Lab 01/27/22 1352  NA 141  K 3.6  CL 107  CO2 26  GLUCOSE 82  BUN 11  CREATININE 0.76  CALCIUM 9.0   Lipid Panel:  Recent Labs  Lab 01/28/22 0319  CHOL 226*  TRIG 253*  HDL 35*  CHOLHDL 6.5  VLDL 51*  LDLCALC 140*   HgbA1c:  Recent Labs  Lab 01/28/22 0319  HGBA1C 5.5   Urine Drug Screen: No results for input(s): "LABOPIA", "COCAINSCRNUR", "LABBENZ", "AMPHETMU", "THCU", "LABBARB" in the last 168 hours.  Alcohol Level No results for input(s): "ETH" in the last 168 hours.  IMAGING past 24 hours MR BRAIN WO CONTRAST  Result Date: 01/30/2022 CLINICAL DATA:  Neuro deficit, stroke suspected. EXAM: MRI HEAD WITHOUT CONTRAST TECHNIQUE: Multiplanar, multiecho pulse sequences of the brain and surrounding structures were obtained without intravenous contrast. COMPARISON:  Noncontrast head CT 01/27/2022 FINDINGS: Brain: There is a small acute infarct in the left thalamus/posterior limb of the internal capsule without hemorrhage or mass effect. There is no other evidence of acute infarct. Background parenchymal volume is normal. The ventricles are normal in size. There is T2/FLAIR hyperintensity in the bilateral lentiform nuclei, thalami, and right caudate head consistent with prior ischemia. Additional patchy and confluent FLAIR signal abnormality in the supratentorial brain likely reflects underlying advanced chronic small-vessel ischemic  change. There are scattered punctate chronic microhemorrhages, favored hypertensive in etiology. There is no mass lesion.  There is no mass effect or midline shift. Vascular: Normal flow voids. Skull and upper cervical spine: Normal marrow signal. Sinuses/Orbits: The paranasal sinuses are clear. A right lens implant is noted. The globes and orbits are otherwise unremarkable. Other: There are bilateral mastoid effusions. IMPRESSION: 1. Small acute infarct in the left thalamus/posterior limb of the internal capsule without hemorrhage or  mass effect. 2. Small remote infarcts and background advanced chronic small-vessel ischemic change as above. 3. Scattered punctate chronic microhemorrhages, favored hypertensive in etiology. These results will be called to the ordering clinician or representative by the Radiologist Assistant, and communication documented in the PACS or [employer]. Electronically Signed   By: Valetta Mole M.D.   On: 01/30/2022 14:16    PHYSICAL EXAM Neuro: Her exam is limited due to recent angiogram She is awake , alert & Oriented x 3. States wants to sleep Speech is fluent, PRLL 3 mm bilat, EOMI She can raise upper extremity bilat  Unable to test lowers.   ASSESSMENT/PLAN Anita Fletcher is a 76 y.o. female presenting with right sided stuttering sensory deficits starting 3 days ago that then became persistent; the 3rd episode's symptoms have not remitted. She was found to have subtle right hemiparesis, and sensory ataxia causing gait difficulties when arrived at [facility].her exam is limited @ this time , however when she arrived @ [place] her exam reveale right hemiataxia, subtle righ sided weakness and right sided hypoesthesia her symptoms were concerning for subcortical left hemispheric ischemic infarction obscured by severe small vessel disease on head CT Scan. MRI Brain w/o contrast (today) reveals small infarct in left thalamus/posterior limb of the internal capsule w/o hemorrhage or mass effect. Small remote infarcts and background w/ advanced chronic small vessel ischemic changes. She is now s/p cerebral angiogram showing a extreme tortuosity of major neck arteries ,Severe tortuosity of the proximal basilar artery resulting in 85% stenosis.  Ulcerated plaque in the left carotid bulb.  No significant stenosis.  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 Apixaban was held at time of admission Patient with h/o Atrial fibrillation . Will need to  consider to restart Apixaban -Permissive HTN period has ended, gradual lowering of BP recommended  -Statin Therapy: High Intensity( LDL-140) she is on Lipitor 40 mg - 80 mg qhs -Patient is s/p angiogram and further treatment discussions per Dr Erlinda Hong and Norma Fredrickson   -PT/OT evaluations, may be good acute rehab candidate based on examination currently  -a1c 5.5, wnl , non-tobacco user   LDL 140 HgbA1c 5.5 VTE prophylaxis -     Diet   Diet NPO time specified Except for: Sips with Meds   Diet regular Room service appropriate? Yes; Fluid consistency: Thin      Hospital day # 3    To contact Stroke Continuity provider, please refer to [email or web address] After hours, contact General Neurology

## 2022-01-31 ENCOUNTER — Inpatient Hospital Stay (HOSPITAL_COMMUNITY): Payer: Medicare HMO

## 2022-01-31 ENCOUNTER — Encounter (HOSPITAL_COMMUNITY)
Admission: EM | Disposition: A | Discharge status: Rehabilitation Facility | Payer: Self-pay | Source: Home / Self Care | Attending: Neuroradiology

## 2022-01-31 ENCOUNTER — Encounter (HOSPITAL_COMMUNITY): Payer: Self-pay | Admitting: Internal Medicine

## 2022-01-31 ENCOUNTER — Inpatient Hospital Stay (HOSPITAL_COMMUNITY): Payer: Medicare HMO | Admitting: Certified Registered"

## 2022-01-31 DIAGNOSIS — I6322 Cerebral infarction due to unspecified occlusion or stenosis of basilar arteries: Secondary | ICD-10-CM

## 2022-01-31 DIAGNOSIS — I4891 Unspecified atrial fibrillation: Secondary | ICD-10-CM

## 2022-01-31 DIAGNOSIS — I639 Cerebral infarction, unspecified: Secondary | ICD-10-CM | POA: Diagnosis not present

## 2022-01-31 DIAGNOSIS — I6350 Cerebral infarction due to unspecified occlusion or stenosis of unspecified cerebral artery: Secondary | ICD-10-CM

## 2022-01-31 DIAGNOSIS — I672 Cerebral atherosclerosis: Secondary | ICD-10-CM | POA: Diagnosis present

## 2022-01-31 DIAGNOSIS — I10 Essential (primary) hypertension: Secondary | ICD-10-CM

## 2022-01-31 HISTORY — PX: IR CT HEAD LTD: IMG2386

## 2022-01-31 HISTORY — PX: IR INTRA CRAN STENT: IMG2345

## 2022-01-31 HISTORY — PX: IR ANGIO VERTEBRAL SEL VERTEBRAL UNI L MOD SED: IMG5367

## 2022-01-31 HISTORY — PX: IR US GUIDE VASC ACCESS LEFT: IMG2389

## 2022-01-31 HISTORY — PX: IR ANGIOGRAM FOLLOW UP STUDY: IMG697

## 2022-01-31 HISTORY — PX: RADIOLOGY WITH ANESTHESIA: SHX6223

## 2022-01-31 LAB — BASIC METABOLIC PANEL
Anion gap: 7 (ref 5–15)
BUN: 12 mg/dL (ref 8–23)
CO2: 22 mmol/L (ref 22–32)
Calcium: 8.3 mg/dL — ABNORMAL LOW (ref 8.9–10.3)
Chloride: 112 mmol/L — ABNORMAL HIGH (ref 98–111)
Creatinine, Ser: 0.79 mg/dL (ref 0.44–1.00)
GFR, Estimated: >60 mL/min (ref >60)
Glucose, Bld: 107 mg/dL — ABNORMAL HIGH (ref 70–99)
Potassium: 3 mmol/L — ABNORMAL LOW (ref 3.5–5.1)
Sodium: 141 mmol/L (ref 135–145)

## 2022-01-31 LAB — CBC
HCT: 40.8 % (ref 36.0–46.0)
Hemoglobin: 14 g/dL (ref 12.0–15.0)
MCH: 31.6 pg (ref 26.0–34.0)
MCHC: 34.3 g/dL (ref 30.0–36.0)
MCV: 92.1 fL (ref 80.0–100.0)
Platelets: 214 10*3/uL (ref 150–400)
RBC: 4.43 MIL/uL (ref 3.87–5.11)
RDW: 12.9 % (ref 11.5–15.5)
WBC: 7.9 10*3/uL (ref 4.0–10.5)
nRBC: 0 % (ref 0.0–0.2)

## 2022-01-31 LAB — POCT I-STAT 7, (LYTES, BLD GAS, ICA,H+H)
Acid-base deficit: 2 mmol/L (ref 0.0–2.0)
Acid-base deficit: 3 mmol/L — ABNORMAL HIGH (ref 0.0–2.0)
Bicarbonate: 25.5 mmol/L (ref 20.0–28.0)
Bicarbonate: 21.9 mmol/L (ref 20.0–28.0)
Calcium, Ion: 1.22 mmol/L (ref 1.15–1.40)
Calcium, Ion: 1.17 mmol/L (ref 1.15–1.40)
HCT: 40 % (ref 36.0–46.0)
HCT: 40 % (ref 36.0–46.0)
Hemoglobin: 13.6 g/dL (ref 12.0–15.0)
Hemoglobin: 13.6 g/dL (ref 12.0–15.0)
O2 Saturation: 96 %
O2 Saturation: 100 %
Patient temperature: 97.9
Potassium: 3.5 mmol/L (ref 3.5–5.1)
Potassium: 3.5 mmol/L (ref 3.5–5.1)
Sodium: 143 mmol/L (ref 135–145)
Sodium: 142 mmol/L (ref 135–145)
TCO2: 27 mmol/L (ref 22–32)
TCO2: 23 mmol/L (ref 22–32)
pCO2 arterial: 54.8 mmHg — ABNORMAL HIGH (ref 32–48)
pCO2 arterial: 37.8 mmHg (ref 32–48)
pH, Arterial: 7.277 — ABNORMAL LOW (ref 7.35–7.45)
pH, Arterial: 7.368 (ref 7.35–7.45)
pO2, Arterial: 97 mmHg (ref 83–108)
pO2, Arterial: 187 mmHg — ABNORMAL HIGH (ref 83–108)

## 2022-01-31 LAB — POCT ACTIVATED CLOTTING TIME
Activated Clotting Time: 137 seconds
Activated Clotting Time: 191 seconds
Activated Clotting Time: 215 seconds

## 2022-01-31 LAB — GLUCOSE, CAPILLARY: Glucose-Capillary: 136 mg/dL — ABNORMAL HIGH (ref 70–99)

## 2022-01-31 SURGERY — RADIOLOGY WITH ANESTHESIA
Anesthesia: General

## 2022-01-31 MED ORDER — IOHEXOL 300 MG/ML  SOLN
100.0000 mL | Freq: Once | INTRAMUSCULAR | Status: AC | PRN
  Administered 2022-01-31: 30 mL via INTRA_ARTERIAL

## 2022-01-31 MED ORDER — FENTANYL CITRATE (PF) 250 MCG/5ML IJ SOLN
INTRAMUSCULAR | Status: DC | PRN
  Administered 2022-01-31: 100 ug via INTRAVENOUS

## 2022-01-31 MED ORDER — CHLORHEXIDINE GLUCONATE CLOTH 2 % EX PADS
6.0000 | MEDICATED_PAD | Freq: Every day | CUTANEOUS | Status: DC
  Administered 2022-01-31 – 2022-02-03 (×4): 6 via TOPICAL

## 2022-01-31 MED ORDER — SUGAMMADEX SODIUM 200 MG/2ML IV SOLN
INTRAVENOUS | Status: DC | PRN
  Administered 2022-01-31 (×2): 200 mg via INTRAVENOUS

## 2022-01-31 MED ORDER — HEPARIN (PORCINE) 25000 UT/250ML-% IV SOLN: 600.0000 [IU]/h | INTRAVENOUS | Status: DC

## 2022-01-31 MED ORDER — HEPARIN SODIUM (PORCINE) 1000 UNIT/ML IJ SOLN
INTRAMUSCULAR | Status: DC | PRN
  Administered 2022-01-31: 2000 [IU] via INTRAVENOUS

## 2022-01-31 MED ORDER — ALPRAZOLAM 0.25 MG PO TABS
0.2500 mg | ORAL_TABLET | Freq: Two times a day (BID) | ORAL | Status: DC | PRN
  Administered 2022-02-01 – 2022-02-03 (×5): 0.25 mg via ORAL
  Filled 2022-01-31 (×6): qty 1

## 2022-01-31 MED ORDER — LIDOCAINE HCL 1 % IJ SOLN
INTRAMUSCULAR | Status: AC
  Filled 2022-01-31: qty 20

## 2022-01-31 MED ORDER — PHENYLEPHRINE 80 MCG/ML (10ML) SYRINGE FOR IV PUSH (FOR BLOOD PRESSURE SUPPORT)
PREFILLED_SYRINGE | INTRAVENOUS | Status: DC | PRN
  Administered 2022-01-31: 160 ug via INTRAVENOUS

## 2022-01-31 MED ORDER — OXYCODONE HCL 5 MG PO TABS: 5.0000 mg | ORAL_TABLET | Freq: Once | ORAL | Status: DC | PRN

## 2022-01-31 MED ORDER — OXYCODONE HCL 5 MG/5ML PO SOLN: 5.0000 mg | Freq: Once | ORAL | Status: DC | PRN

## 2022-01-31 MED ORDER — ONDANSETRON HCL 4 MG/2ML IJ SOLN
INTRAMUSCULAR | Status: DC | PRN
  Administered 2022-01-31: 4 mg via INTRAVENOUS

## 2022-01-31 MED ORDER — HEPARIN SODIUM (PORCINE) 1000 UNIT/ML IJ SOLN
INTRAMUSCULAR | Status: DC | PRN
  Administered 2022-01-31: 5000 [IU] via INTRA_ARTERIAL

## 2022-01-31 MED ORDER — ACETAMINOPHEN 160 MG/5ML PO SOLN: 650.0000 mg | ORAL | Status: DC | PRN

## 2022-01-31 MED ORDER — PHENYLEPHRINE HCL-NACL 20-0.9 MG/250ML-% IV SOLN
INTRAVENOUS | Status: DC | PRN
  Administered 2022-01-31: 30 ug/min via INTRAVENOUS

## 2022-01-31 MED ORDER — LACTATED RINGERS IV SOLN: INTRAVENOUS | Status: DC

## 2022-01-31 MED ORDER — CLEVIDIPINE BUTYRATE 0.5 MG/ML IV EMUL
INTRAVENOUS | Status: AC
  Administered 2022-01-31: 5 mg/h via INTRAVENOUS
  Filled 2022-01-31: qty 50

## 2022-01-31 MED ORDER — ONDANSETRON HCL 4 MG/2ML IJ SOLN: 4.0000 mg | Freq: Four times a day (QID) | INTRAMUSCULAR | Status: DC | PRN

## 2022-01-31 MED ORDER — ORAL CARE MOUTH RINSE: 15.0000 mL | Freq: Once | OROMUCOSAL | Status: AC

## 2022-01-31 MED ORDER — AMLODIPINE BESYLATE 5 MG PO TABS
5.0000 mg | ORAL_TABLET | Freq: Every day | ORAL | Status: DC
  Administered 2022-01-31 – 2022-02-04 (×5): 5 mg via ORAL
  Filled 2022-01-31 (×5): qty 1

## 2022-01-31 MED ORDER — ROCURONIUM BROMIDE 10 MG/ML (PF) SYRINGE
PREFILLED_SYRINGE | INTRAVENOUS | Status: DC | PRN
  Administered 2022-01-31: 20 mg via INTRAVENOUS
  Administered 2022-01-31: 50 mg via INTRAVENOUS

## 2022-01-31 MED ORDER — FENTANYL CITRATE (PF) 100 MCG/2ML IJ SOLN
INTRAMUSCULAR | Status: AC
  Filled 2022-01-31: qty 2

## 2022-01-31 MED ORDER — ACETAMINOPHEN 325 MG PO TABS: 650.0000 mg | ORAL_TABLET | ORAL | Status: DC | PRN

## 2022-01-31 MED ORDER — HEPARIN SODIUM (PORCINE) 1000 UNIT/ML IJ SOLN
INTRAMUSCULAR | Status: AC
  Filled 2022-01-31: qty 10

## 2022-01-31 MED ORDER — LIDOCAINE 2% (20 MG/ML) 5 ML SYRINGE
INTRAMUSCULAR | Status: DC | PRN
  Administered 2022-01-31: 60 mg via INTRAVENOUS

## 2022-01-31 MED ORDER — ALBUTEROL SULFATE (2.5 MG/3ML) 0.083% IN NEBU
INHALATION_SOLUTION | RESPIRATORY_TRACT | Status: AC
  Administered 2022-01-31: 2.5 mg
  Filled 2022-01-31: qty 3

## 2022-01-31 MED ORDER — CHLORHEXIDINE GLUCONATE 0.12 % MT SOLN: 15.0000 mL | Freq: Once | OROMUCOSAL | Status: AC

## 2022-01-31 MED ORDER — ACETAMINOPHEN 650 MG RE SUPP: 650.0000 mg | RECTAL | Status: DC | PRN

## 2022-01-31 MED ORDER — VERAPAMIL HCL 2.5 MG/ML IV SOLN
INTRAVENOUS | Status: DC | PRN
  Administered 2022-01-31: 5 mg via INTRA_ARTERIAL

## 2022-01-31 MED ORDER — TICAGRELOR 90 MG PO TABS
180.0000 mg | ORAL_TABLET | Freq: Once | ORAL | Status: AC
  Administered 2022-01-31: 180 mg via ORAL
  Filled 2022-01-31: qty 2

## 2022-01-31 MED ORDER — CHLORHEXIDINE GLUCONATE 0.12 % MT SOLN
OROMUCOSAL | Status: AC
  Administered 2022-01-31: 15 mL via OROMUCOSAL
  Filled 2022-01-31: qty 15

## 2022-01-31 MED ORDER — IOHEXOL 300 MG/ML  SOLN
150.0000 mL | Freq: Once | INTRAMUSCULAR | Status: AC | PRN
  Administered 2022-01-31: 30 mL via INTRA_ARTERIAL

## 2022-01-31 MED ORDER — CLEVIDIPINE BUTYRATE 0.5 MG/ML IV EMUL
0.0000 mg/h | INTRAVENOUS | Status: DC
  Administered 2022-01-31: 2 mg/h via INTRAVENOUS
  Administered 2022-02-01: 4 mg/h via INTRAVENOUS
  Filled 2022-01-31 (×2): qty 50

## 2022-01-31 MED ORDER — NITROGLYCERIN 1 MG/10 ML FOR IR/CATH LAB
INTRA_ARTERIAL | Status: DC | PRN
  Administered 2022-01-31: 200 ug via INTRA_ARTERIAL

## 2022-01-31 MED ORDER — NITROGLYCERIN 1 MG/10 ML FOR IR/CATH LAB
INTRA_ARTERIAL | Status: AC
  Filled 2022-01-31: qty 10

## 2022-01-31 MED ORDER — VERAPAMIL HCL 2.5 MG/ML IV SOLN
INTRAVENOUS | Status: AC
  Filled 2022-01-31: qty 2

## 2022-01-31 MED ORDER — TICAGRELOR 90 MG PO TABS
90.0000 mg | ORAL_TABLET | Freq: Two times a day (BID) | ORAL | Status: DC
  Administered 2022-02-01 – 2022-02-04 (×7): 90 mg via ORAL
  Filled 2022-01-31 (×7): qty 1

## 2022-01-31 MED ORDER — PROPOFOL 10 MG/ML IV BOLUS
INTRAVENOUS | Status: DC | PRN
  Administered 2022-01-31: 50 mg via INTRAVENOUS
  Administered 2022-01-31: 150 mg via INTRAVENOUS

## 2022-01-31 MED ORDER — DEXAMETHASONE SODIUM PHOSPHATE 10 MG/ML IJ SOLN
INTRAMUSCULAR | Status: DC | PRN
  Administered 2022-01-31: 10 mg via INTRAVENOUS

## 2022-01-31 MED ORDER — HEPARIN (PORCINE) 25000 UT/250ML-% IV SOLN
500.0000 [IU]/h | INTRAVENOUS | Status: DC
  Administered 2022-01-31: 500 [IU]/h via INTRAVENOUS

## 2022-01-31 MED ORDER — FENTANYL CITRATE (PF) 100 MCG/2ML IJ SOLN: 25.0000 ug | INTRAMUSCULAR | Status: DC | PRN

## 2022-01-31 NOTE — Progress Notes (Signed)
Called by RN that pt was off BiPAP now on [place] but found to have left facial droop.   I came to bedside. And pt is on [place]. AAO x 3, no aphasia, fluent language, following all simple commands. Visual field full, no gaze palsy, denies diplopia, but bidirectional horizontal nystagmus noted. Left upper and lower facial weakness, decreased left eye closure, left forehead decreased winkles. Tongue midline. Right facial and hand and leg decreased light touch, and right arm loss of light touch sensation. Still has right hemiataxia.  Pt symptoms concerning for small brainstem infarct associated with BA stenting procedure. Pt needs anesthesia for MRI brain which is not available at this time. Will do stat CT head to rule out bleeding or SAH. Discussed with Dr. Norma Fredrickson, once bleeding ruled out, will consider load her with brilinta. Will do bedside swallow screen before giving brilinta. Continue PT/OT.   Rosalin Hawking, MD PhD Stroke Neurology 01/31/2022 7:18 PM  This patient is critically ill due to possible new infarct and at significant risk of neurological worsening, death form brainstem infarct. This patient's care requires constant monitoring of vital signs, hemodynamics, respiratory and cardiac monitoring, review of multiple databases, neurological assessment, discussion with family, other specialists and medical decision making of high complexity. I spent 30 minutes of neurocritical care time in the care of this patient.

## 2022-01-31 NOTE — Progress Notes (Signed)
PROGRESS NOTE    Anita Fletcher  JSE:[id number] DOB: April 30, 1945 DOA: 01/27/2022 PCP: Patient, No Pcp Per   Brief Narrative:  76 year old female with a history of paroxysmal atrial fibrillation on anticoagulation, hypertension, prior history of TIA, presents to the hospital with right arm, right face, right leg numbness.  CT head indicated possible CVA.  She is being admitted to [facility] for further evaluation by neurology and MRI brain. Anticipating transfer today and Neurology team aware.  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  -Patient planned for basilar artery stent placement 01/31/2022  Hypertension -Resume home medications in the next 24 hours   Paroxysmal atrial fibrillation -Currently in sinus rhythm -Reported compliance with Eliquis prior to admission -Currently on aspirin and Plavix statin -likely to resume Eliquis and possibly low-dose aspirin per neurology at discharge - lovenox ongoing for now   Profound Anxiety/Claustrophobia -Severe claustrophobia at baseline, has difficulty even riding in elevators -Xanax ordered as needed for anxiety -MRI completed with sedation, appreciate radiology and anesthesia's assistance     DVT prophylaxis: enoxaparin   Code Status: Full code Family Communication: Discussed with patient Disposition Plan: Status is: Inpatient Remains inpatient appropriate because: Continued work-up in the setting of acute stroke   Consultants:   Neurology   Antimicrobials:   None    Subjective: Patient evaluated after discussion with radiology regarding carotid artery study, patient markedly anxious reporting some previous friend having suddenly died after imaging which we discussed with extremely rare.  Regardless she is finally agreeable for MRI with sedation as well as vascular study which we discussed with necessary for proper diagnosis as this is her main concern for being hospitalized.  Objective: Vitals:   01/31/22 0009 01/31/22 0115 01/31/22 0143 01/31/22 0328  BP: (!) 175/85 (!) 172/74 (!) 149/82 (!) 175/75  Pulse: 72 71 83 64  Resp: 18 20 20 20   Temp: 98 F (36.7 C) 97.6 F (36.4 C)  98.1 F (36.7 C)  TempSrc: Oral Oral  Oral  SpO2: 96% 96% 98% 97%  Weight:      Height:       No intake or output data in the 24 hours ending 01/31/22 0807  Filed Weights   01/27/22 1300 01/30/22 1121  Weight: 76.2 kg 74.8 kg    Examination:  General: No acute distress but clearly anxious about imaging and procedure HEENT:  Normocephalic atraumatic.  Sclerae nonicteric, noninjected.  Extraocular movements intact bilaterally. Neck:  Without mass or deformity.  Trachea is midline. Lungs:  Clear to auscultate bilaterally without rhonchi, wheeze, or rales. Heart: Tachycardic but regular rhythm no murmurs rubs or gallops Abdomen:  Soft, nontender, nondistended.  Without guarding or rebound. Extremities: Without cyanosis, clubbing, edema, or obvious deformity. Vascular:  Dorsalis pedis and posterior tibial pulses palpable bilaterally. Skin:  Warm and dry, no erythema, no ulcerations.   Data Reviewed: I have personally reviewed following labs and imaging studies  CBC: Recent Labs  Lab 01/27/22 1352 01/31/22 0320  WBC 7.1 7.9  NEUTROABS 4.4  --   HGB 14.9  14.0  HCT 44.4 40.8  MCV 92.7 92.1  PLT 253 517    Basic Metabolic Panel: Recent Labs  Lab 01/27/22 1352 01/31/22 0320  NA 141 141  K 3.6 3.0*  CL 107 112*   CO2 26 22  GLUCOSE 82 107*  BUN 11 12  CREATININE 0.76 0.79  CALCIUM 9.0 8.3*    GFR: Estimated Creatinine Clearance: 60.5 mL/min (by C-G formula based on SCr of 0.79 mg/dL). Liver Function Tests: Recent Labs  Lab 01/27/22 1352  AST 14*  ALT 15  ALKPHOS 56  BILITOT 0.5  PROT 6.8  ALBUMIN 3.8    No results for input(s): "LIPASE", "AMYLASE" in the last 168 hours. No results for input(s): "AMMONIA" in the last 168 hours. Coagulation Profile: Recent Labs  Lab 01/30/22 0354  INR 1.1    Cardiac Enzymes: No results for input(s): "CKTOTAL", "CKMB", "CKMBINDEX", "TROPONINI" in the last 168 hours. BNP (last 3 results) No results for input(s): "PROBNP" in the last 8760 hours. HbA1C: No results for input(s): "HGBA1C" in the last 72 hours.  CBG: Recent Labs  Lab 01/27/22 1354  GLUCAP 77    Lipid Profile: No results for input(s): "CHOL", "HDL", "LDLCALC", "TRIG", "CHOLHDL", "LDLDIRECT" in the last 72 hours.  Thyroid Function Tests: No results for input(s): "TSH", "T4TOTAL", "FREET4", "T3FREE", "THYROIDAB" in the last 72 hours. Anemia Panel: No results for input(s): "VITAMINB12", "FOLATE", "FERRITIN", "TIBC", "IRON", "RETICCTPCT" in the last 72 hours. Sepsis Labs: No results for input(s): "PROCALCITON", "LATICACIDVEN" in the last 168 hours.  No results found for this or any previous visit (from the past 240 hour(s)).       Radiology Studies: MR BRAIN WO CONTRAST  Result Date: 01/30/2022 CLINICAL DATA:  Neuro deficit, stroke suspected. EXAM: MRI HEAD WITHOUT CONTRAST TECHNIQUE: Multiplanar, multiecho pulse sequences of the brain and surrounding structures were obtained without intravenous contrast. COMPARISON:  Noncontrast head CT 01/27/2022 FINDINGS: Brain: There is a small acute infarct in the left thalamus/posterior limb of the internal capsule without hemorrhage or mass effect. There is no other evidence of acute infarct. Background parenchymal volume is normal.  The ventricles are normal in size. There is T2/FLAIR hyperintensity in the bilateral lentiform nuclei, thalami, and right caudate head consistent with prior ischemia. Additional patchy and confluent FLAIR signal abnormality in the supratentorial brain likely reflects underlying advanced chronic small-vessel ischemic change. There are scattered punctate chronic microhemorrhages, favored hypertensive in etiology. There is no mass lesion.  There is no mass effect or midline shift. Vascular: Normal flow voids. Skull and upper cervical spine: Normal marrow signal. Sinuses/Orbits: The paranasal sinuses are clear. A right lens implant is noted. The globes and orbits are otherwise unremarkable. Other: There are bilateral mastoid effusions. IMPRESSION: 1. 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 2. Small remote infarcts and background advanced chronic small-vessel ischemic change as above. 3. Scattered punctate chronic microhemorrhages, favored hypertensive in etiology. These results will be called to the ordering clinician or representative by the Radiologist Assistant, and communication documented in the PACS or [employer]. Electronically Signed   By: Valetta Mole M.D.   On: 01/30/2022 14:16        Scheduled Meds:   stroke: early stages of recovery book   Does not apply Once   aspirin  81 mg Oral Daily   atorvastatin  40 mg Oral Daily   clopidogrel  75 mg Oral Daily   enoxaparin (LOVENOX) injection  40  mg Subcutaneous Q24H     LOS: 4 days    Time spent: 35 minutes    Azucena Fallen, DO [facility]  If 7PM-7AM, please contact night-coverage [email or web address] 01/31/2022, 8:07 AM

## 2022-01-31 NOTE — Progress Notes (Addendum)
STROKE TEAM PROGRESS NOTE   INTERVAL HISTORY No family at bedside.  Patient lying in bed, still complains of left hand and leg numbness sensation, but no weakness.  Patient still has left upper and lower extremity dysmetria.  Discussed with patient regarding basilar artery stenting versus medical treatment.  Patient after discussed with family decided to proceed with basilar artery stenting.  Dr. Norma Fredrickson from neuro IR informed.  Vitals:   01/31/22 1344 01/31/22 1345 01/31/22 1400 01/31/22 1454  BP:  128/71 111/71   Pulse: 78 78 73 75  Resp: (!) 22 20 20 18   Temp:   97.8 F (36.6 C)   TempSrc:      SpO2:  96% 98% 97%  Weight:      Height:       CBC:  Recent Labs  Lab 01/27/22 1352 01/31/22 0320 01/31/22 1331 01/31/22 1549  WBC 7.1 7.9  --   --   NEUTROABS 4.4  --   --   --   HGB 14.9 14.0 13.6 13.6  HCT 44.4 40.8 40.0 40.0  MCV 92.7 92.1  --   --   PLT 253 214  --   --    Basic Metabolic Panel:  Recent Labs  Lab 01/27/22 1352 01/31/22 0320 01/31/22 1331 01/31/22 1549  NA 141 141 143 142  K 3.6 3.0* 3.5 3.5  CL 107 112*  --   --   CO2 26 22  --   --   GLUCOSE 82 107*  --   --   BUN 11 12  --   --   CREATININE 0.76 0.79  --   --   CALCIUM 9.0 8.3*  --   --    Lipid Panel:  Recent Labs  Lab 01/28/22 0319  CHOL 226*  TRIG 253*  HDL 35*  CHOLHDL 6.5  VLDL 51*  LDLCALC 140*   HgbA1c:  Recent Labs  Lab 01/28/22 0319  HGBA1C 5.5    IMAGING past 24 hours DG CHEST PORT 1 VIEW  Result Date: 01/31/2022 CLINICAL DATA:  CVA.  Numbness. EXAM: PORTABLE CHEST 1 VIEW COMPARISON:  08/24/2021 FINDINGS: Stable enlarged cardiac silhouette and large hiatal hernia. Tortuous and calcified thoracic aorta. Interval patchy density at the left lung base and minimal patchy density at the right lung base. Interval small amount of linear atelectasis in the right mid lung zone. Interval minimal right pleural effusion and possible small left pleural effusion. Diffuse osteopenia.  IMPRESSION: 1. Interval patchy atelectasis or pneumonia at the left lung base and minimal patchy atelectasis or pneumonia at the right lung base. 2. Interval minimal right pleural effusion and possible small left pleural effusion. 3. Stable cardiomegaly and large hiatal hernia. Electronically Signed   By: Claudie Revering M.D.   On: 01/31/2022 13:44    PHYSICAL EXAM  Temp:  [97.2 F (36.2 C)-98.3 F (36.8 C)] 97.9 F (36.6 C) (11/03 1500) Pulse Rate:  [62-83] 72 (11/03 1830) Resp:  [12-24] 17 (11/03 1830) BP: (97-191)/(52-98) 125/65 (11/03 1830) SpO2:  [94 %-100 %] 96 % (11/03 1830) Arterial Line BP: (149-158)/(64-70) 149/64 (11/03 1400) FiO2 (%):  [40 %-60 %] 40 % (11/03 1600)  General - Well nourished, well developed, in no apparent distress.  Ophthalmologic - fundi not visualized due to noncooperation.  Cardiovascular - Regular rhythm and rate, not in afib.  Mental Status -  Level of arousal and orientation to time, place, and person were intact. Language including expression, naming, repetition, comprehension was assessed and  found intact. Fund of Knowledge was assessed and was intact.  Cranial Nerves II - XII - II - Visual field intact OU. III, IV, VI - Extraocular movements intact. V - Facial sensation decreased on the right VII - Facial movement intact bilaterally. VIII - Hearing & vestibular intact bilaterally. X - Palate elevates symmetrically. XI - Chin turning & shoulder shrug intact bilaterally. XII - Tongue protrusion intact.  Motor Strength - The patient's strength was normal in all extremities and pronator drift was absent.  Bulk was normal and fasciculations were absent.   Motor Tone - Muscle tone was assessed at the neck and appendages and was normal.  Reflexes - The patient's reflexes were symmetrical in all extremities and she had no pathological reflexes.  Sensory - Light touch, temperature/pinprick were assessed and were decreased on the right UE and LE.    Coordination - The patient had normal movements in the left hand and foot with no ataxia or dysmetria, but dysmetria on the R FTN and ataxia on the R HTS.  Tremor was absent.  Gait and Station - deferred.   ASSESSMENT/PLAN Anita Fletcher is a 76 y.o. female presenting with right sided stuttering sensory deficits starting 3 days ago that then became persistent; the 3rd episode's symptoms have not remitted. She was found to have subtle right hemiparesis, and sensory ataxia causing gait difficulties when arrived at [facility].her exam is limited @ this time , however when she arrived @ [facility] her exam reveale right hemiataxia, subtle righ sided weakness and right sided hypoesthesia her symptoms were concerning for subcortical left hemispheric ischemic infarction obscured by severe small vessel disease on head CT Scan. MRI Brain w/o contrast (today) reveals small infarct in left thalamus/posterior limb of the internal capsule w/o hemorrhage or mass effect. Small remote infarcts and background w/ advanced chronic small vessel ischemic changes. She is now s/p cerebral angiogram showing a extreme tortuosity of major neck arteries ,Severe tortuosity of the proximal basilar artery resulting in 85% stenosis.  Ulcerated plaque in the left carotid bulb.  No significant stenosis.  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 Large vessel disease from BA stenosis CT no acute abnormality.   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   MRI showed left lateral thalamic infarct.   Cerebral angiogram today showed extreme tortuosity of major neck arteries, severe tortuosity of the proximal basilar artery resulting in 85% stenosis, Ulcerated plaque in the left carotid bulb.  No significant stenosis.   Bilateral fetal PCAs with severe atherosclerotic disease, greater on the left side where there is moderate stenosis at the P2 segment.  EF 70 to 75%.   LDL 140, TG 253 A1c 5.5.   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   Cerebral angiogram today showed extreme tortuosity of major neck arteries, severe tortuosity of the proximal basilar artery resulting in 85% stenosis. Pt agreed with BA stenting today with Dr. Sherlon Handing  Afib On eliqiis at home Current on hold for [facility] stent May consider to resume eliquis 24 h post stenting  Hypertension Stable  Long term BP goal normotensive  Hyperlipidemia Home meds:  none  LDL 140, goal < 70 Now on lipitor 40 Continue statin at discharge  Other Stroke Risk Factors Advanced age Hx of TIA  Other Active Problems   Hospital day # 4   Marvel Plan, MD PhD Stroke Neurology 01/31/2022 6:07 PM    To contact Stroke Continuity provider, please refer to [email or web address]. After hours, contact General Neurology

## 2022-01-31 NOTE — Anesthesia Preprocedure Evaluation (Signed)
Anesthesia Evaluation  Patient identified by MRN, date of birth, ID band Patient awake    Reviewed: Allergy & Precautions, H&P , NPO status , Patient's Chart, lab work & pertinent test results  Airway Mallampati: II   Neck ROM: full    Dental   Pulmonary neg pulmonary ROS   breath sounds clear to auscultation       Cardiovascular hypertension, + dysrhythmias Atrial Fibrillation  Rhythm:regular Rate:Normal     Neuro/Psych CVA    GI/Hepatic   Endo/Other    Renal/GU      Musculoskeletal   Abdominal   Peds  Hematology   Anesthesia Other Findings   Reproductive/Obstetrics                             Anesthesia Physical Anesthesia Plan  ASA: 3  Anesthesia Plan: General   Post-op Pain Management:    Induction: Intravenous  PONV Risk Score and Plan: 3 and Ondansetron, Dexamethasone, Midazolam and Treatment may vary due to age or medical condition  Airway Management Planned: Oral ETT  Additional Equipment:   Intra-op Plan:   Post-operative Plan: Extubation in OR  Informed Consent: I have reviewed the patients History and Physical, chart, labs and discussed the procedure including the risks, benefits and alternatives for the proposed anesthesia with the patient or authorized representative who has indicated his/her understanding and acceptance.     Dental advisory given  Plan Discussed with: CRNA, Anesthesiologist and Surgeon  Anesthesia Plan Comments:        Anesthesia Quick Evaluation

## 2022-01-31 NOTE — Transfer of Care (Signed)
Immediate Anesthesia Transfer of Care Note  Patient: Anita Fletcher  Procedure(s) Performed: RADIOLOGY WITH ANESTHESIA  Patient Location: PACU  Anesthesia Type:General  Level of Consciousness: alert , oriented, drowsy, and responds to stimulation  Airway & Oxygen Therapy: Patient Spontanous Breathing and non-rebreather face mask  Post-op Assessment: Report given to RN, Post -op Vital signs reviewed and stable, and Patient moving all extremities X 4,increased work of breathing communicated to anesthesiologist.  Post vital signs: Reviewed and stable  Last Vitals:  Vitals Value Taken Time  BP 122/66 01/31/22 1330  Temp    Pulse 74 01/31/22 1337  Resp 21 01/31/22 1337  SpO2 92 % 01/31/22 1337  Vitals shown include unvalidated device data.  Last Pain:  Vitals:   01/31/22 0811  TempSrc: Oral  PainSc:       Patients Stated Pain Goal: 2 (29/56/21 3086)  Complications: No notable events documented.

## 2022-01-31 NOTE — Anesthesia Procedure Notes (Signed)
Procedure Name: Intubation Date/Time: 01/31/2022 11:12 AM  Performed by: Griffin Dakin, CRNAPre-anesthesia Checklist: Patient identified, Emergency Drugs available, Suction available and Patient being monitored Patient Re-evaluated:Patient Re-evaluated prior to induction Oxygen Delivery Method: Circle system utilized Preoxygenation: Pre-oxygenation with 100% oxygen Induction Type: IV induction Ventilation: Oral airway inserted - appropriate to patient size and Two handed mask ventilation required Laryngoscope Size: Mac and 4 Grade View: Grade I Tube type: Oral Number of attempts: 1 Airway Equipment and Method: Stylet and Oral airway Placement Confirmation: ETT inserted through vocal cords under direct vision, positive ETCO2 and breath sounds checked- equal and bilateral Secured at: 22 cm Tube secured with: Tape Dental Injury: Teeth and Oropharynx as per pre-operative assessment

## 2022-01-31 NOTE — Anesthesia Procedure Notes (Signed)
Arterial Line Insertion Start/End11/05/2021 10:20 AM, 01/31/2022 10:30 AM Performed by: Albertha Ghee, MD, Betha Loa, CRNA, CRNA  Patient location: Pre-op. Preanesthetic checklist: patient identified, IV checked, site marked, risks and benefits discussed, surgical consent, monitors and equipment checked, pre-op evaluation, timeout performed and anesthesia consent Lidocaine 1% used for infiltration Right, radial was placed Catheter size: 20 G Hand hygiene performed  and maximum sterile barriers used   Attempts: 1 Procedure performed without using ultrasound guided technique. Following insertion, dressing applied and Biopatch. Post procedure assessment: normal and unchanged  Patient tolerated the procedure well with no immediate complications.

## 2022-01-31 NOTE — Consult Note (Signed)
HPI:  The patient has had a H&P performed yesterday, all history, medications, and exam have been reviewed. The patient denies any interval changes since the H&P.  [name] was admitted for 3 episodes of right face and arm numbness.  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  Recommendation is for stenting of the basilar artery today.  She has been taking aspirin, Plavix, and lipitor 40.  Her Eliquis has been held since yesterday.    Medications: Prior to Admission medications   Medication Sig Start Date End Date Taking? Authorizing Provider  apixaban (ELIQUIS) 5 MG TABS tablet Take 1 tablet (5 mg total) by mouth 2 (two) times daily. 09/23/21  Yes O'Neal, Ronnald Ramp, MD  losartan (COZAAR) 50 MG tablet Take 1 tablet (50 mg total) by mouth daily. 01/16/22 01/11/23 Yes O'Neal, Ronnald Ramp, MD  metoprolol tartrate (LOPRESSOR) 25 MG tablet Take 1 tablet (25 mg total) by mouth 2 (two) times daily. 09/24/21  Yes Rollene Rotunda, MD     Vital Signs: BP (!) 191/83 (BP Location: Left Arm)   Pulse 66   Temp 97.7 F (36.5 C) (Oral)   Resp 14   Ht 5' 3.5" (1.613 m)   Wt 165 lb (74.8 kg)   SpO2 95%   BMI 28.77 kg/m   Physical Exam  Mallampati Score:  MD Evaluation Airway: WNL Heart: WNL Abdomen: WNL Chest/ Lungs: WNL ASA  Classification: 3 Mallampati/Airway Score: Two  Labs:  CBC: Recent Labs    08/24/21 1507 01/27/22 1352 01/31/22 0320  WBC 9.4 7.1 7.9  HGB 16.3* 14.9 14.0  HCT 48.6* 44.4 40.8  PLT 276 253 214    COAGS: Recent Labs    01/30/22 0354  INR 1.1    BMP: Recent Labs    08/24/21 1507 01/27/22 1352 01/31/22 0320  NA 141 141 141  K 3.6 3.6 3.0*  CL 109 107 112*  CO2 26 26 22   GLUCOSE 156* 82 107*  BUN 11 11 12   CALCIUM 8.9 9.0 8.3*  CREATININE 0.84 0.76 0.79  GFRNONAA >60 >60 >60    LIVER FUNCTION TESTS: Recent Labs    08/24/21 1507 01/27/22 1352  BILITOT  0.6 0.5  AST 20 14*  ALT 15 15  ALKPHOS 58 56  PROT 6.8 6.8  ALBUMIN 3.9 3.8    Assessment/Plan:  Acute CVA --diagnostic angiogram shows stenosis of proximal basilar artery and bilateral fetal PCAs. --Recommend basilar artery stent placement with General Anesthesia --Pt is appropriately NPO, and appropriate medications administered.  History, vitals, imaging, and labs reviewed --Case reviewed by Dr. 08/26/21 and Dr. 01/29/22 who agree with proceeding on this date --General Anesthesia available at 1100 today, per Nyra Jabs --Patient has consulted with her family and wishes to proceed.  Risks and benefits of basilar artery stent placement were discussed with the patient including, but not limited to bleeding, infection, vascular injury or contrast induced renal failure.  This interventional procedure involves the use of X-rays and because of the nature of the planned procedure, it is possible that we will have prolonged use of X-ray fluoroscopy.  Potential radiation risks to you include (but are not limited to) the following: - A slightly elevated risk for cancer  several years later in life. This risk is typically less than 0.5% percent. This risk is low in comparison to the normal incidence of human cancer, which is 33% for women and 50% for men  according to the [employer]. - Radiation induced injury can include skin redness, resembling a rash, tissue breakdown / ulcers and hair loss (which can be temporary or permanent).   The likelihood of either of these occurring depends on the difficulty of the procedure and whether you are sensitive to radiation due to previous procedures, disease, or genetic conditions.   IF your procedure requires a prolonged use of radiation, you will be notified and given written instructions for further action.  It is your responsibility to monitor the irradiated area for the 2 weeks following the procedure and to notify your physician if you are  concerned that you have suffered a radiation induced injury.    All of the patient's questions were answered, patient is agreeable to proceed.  Consent signed and in chart.  SignedPasty Spillers 01/31/2022, 9:01 AM   I spent a total of 40 Minutes    in face to face in clinical consultation, greater than 50% of which was counseling/coordinating care for symptomatic intracranial stenosis.

## 2022-01-31 NOTE — Progress Notes (Addendum)
ANTICOAGULATION CONSULT NOTE - Initial Consult  Pharmacy Consult:  Heparin  Indication: Post interventional neuroradiology procedure  Allergies  Allergen Reactions   Codeine     Patient Measurements: Height: 5' 3.5" (161.3 cm) Weight: 74.8 kg (165 lb) IBW/kg (Calculated) : 53.55 Heparin Dosing Weight: 68 kg  Vital Signs: Temp: 97.8 F (36.6 C) (11/03 1400) Temp Source: Oral (11/03 0811) BP: 111/71 (11/03 1400) Pulse Rate: 75 (11/03 1454)  Labs: Recent Labs    01/30/22 0354 01/31/22 0320 01/31/22 1331  HGB  --  14.0 13.6  HCT  --  40.8 40.0  PLT  --  214  --   LABPROT 13.7  --   --   INR 1.1  --   --   CREATININE  --  0.79  --     Estimated Creatinine Clearance: 60.5 mL/min (by C-G formula based on SCr of 0.79 mg/dL).   Medical History: Past Medical History:  Diagnosis Date   Acute CVA (cerebrovascular accident) ([place]) 01/27/2022   HTN (hypertension) 01/27/2022   TIA (transient ischemic attack)     Assessment: 89 YOF with history of Afib/CVA on Eliquis PTA, last dose 10/30 AM.  Patient presented with numbness to the right face, hand and lower extremity.  She is post interventional neuroradiology procedure and pharmacy consulted to manage IV heparin.  Heparin started at 500 units/hr in the PACU.  CBC stable.  Goal of Therapy:  Heparin level 0.1-0.25 units/ml aPTT 47-61 seconds Monitor platelets by anticoagulation protocol: Yes   Plan:  D/C Lovenox Increase heparin infusion to 600 units/hr Check 8 hr heparin level and aPTT F/u with resuming PTA Eliquis when possible   Deshawn Witty D. Mina Marble, PharmD, BCPS, [place] 01/31/2022, 2:59 PM

## 2022-01-31 NOTE — Progress Notes (Addendum)
Up and amb with assist to BR. Pt unsteady on feet. Stated "I feel wobbly."  Pt more unsteady going back to bed. Had to hold onto sink even with me holding onto her. Pt stated "I think I'm wobbly because I was in bed for 2 days." Pt made NPO for Carotid studies In the AM. VS rechecked after amb and right groin site checked after amb

## 2022-01-31 NOTE — Sedation Documentation (Signed)
Left radial sheath removed, TR band applied with 4cc @ 1238. Left radial pulse palpable.

## 2022-01-31 NOTE — Progress Notes (Signed)
PT taken off BiPAP by RN post ABG, PT tolerating well on 2L, no distress. RT will continue to monitor.

## 2022-01-31 NOTE — Care Management (Addendum)
Patient arrived to unit on Bipap and lethargic. RN concerned about patient status and called IR PA. PA and Neurology assessed patient who was appropriately responding to commands/questions at that time.   Patients right hand was assessed upon arrival to unit; >3sec capillary refill, red, and warm to the touch. RN flushed arterial line and blanching was noticed radiating towards radial artery. Dr. Erlinda Hong okay'd removal of arterial line and use cuff pressures to maintain BP within parameters.    1830: Upon removal of bipap and assessment of mNIHSS, RN noted Left sided facial paralysis that was not present before. Dr. Erlinda Hong contacted and assessed patient. Orders placed.

## 2022-01-31 NOTE — Plan of Care (Signed)
Pt amb to and from BR with max assist of 1 r/t being very unsteady on feet. Flat BR expired at 0130 and that's when pt amb to BR. Pt cont to have numbness in RUE and RLE which didn't chg after amb. Right groin Angio site without hematoma or bleeding. Denies any pain in RLE, Angio site, or lower back. Instructed pt if at any time any of these sxs happen or numbness gets worse call staff. Pt voiced understanding

## 2022-01-31 NOTE — Sedation Documentation (Signed)
Patient transported to PACU with CRNA. Left TR band (clean, dry and intact). Left radial pulse palpable. PACU RN made aware that patient will need 300mg  Aspirin rectal

## 2022-01-31 NOTE — Progress Notes (Signed)
IR Brief Progress note  Called bedside once patient was transferred to 4N by RN who was concerned [name] was not looking good.   On exam, [name] responds in brief phrases appropriately.  She follows instructions.  She denies pain.  She reports she still has some tingling of her right hand.  She is not in acute distress.  Vitals are stable. The right hand is very red appearing.  As RN injects through arterial line in right hand, there is immediate blanching in a radial distribution.  Color returns shortly thereafter.  Dr Erlinda Hong recommends removal of arterial line at this time and maintaining medications through venous access. Will continue to monitor and follow patient. Further recommendations per stroke team. NIR available as needed.   Electronically Signed: Pasty Spillers, PA-C 01/31/2022, 4:38 PM

## 2022-01-31 NOTE — Plan of Care (Signed)
Pt seen at 4N ICU post BA stenting. CCM PA and RN at the beside. She was lethargic and mildly drowsy, on BIPAP. She open eyes on voice, and moving all extremities, still complains of right hand numbness, same as before. Asking for water. Currently neuro stable but will need some respiratory support.   Will d/c A line, BP go by cuff pressure, BP goal 120-140 about 24h post IR procedure. Close respiratory monitoring, if getting worse, will need CCM consult.   Rosalin Hawking, MD PhD Stroke Neurology 01/31/2022 6:13 PM

## 2022-01-31 NOTE — Procedures (Signed)
INTERVENTIONAL NEURORADIOLOGY BRIEF POSTPROCEDURE NOTE  DIAGNOSTIC CEREBRAL ANGIOGRAM AND INTRACRANIAL ANGIOPLASTY AND STENTING    Attending: Dr. 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   Complications: None    Estimated blood loss: Negligible    Specimen: None    Findings: Severe stenosis of the proximal basilar artery again demonstrated.  Angioplasty and stenting performed with a 2.5 x 20 mm synergy balloon mounted drug-eluting stent with complete resolution of stenosis.  No evidence of thromboembolic complication.

## 2022-01-31 NOTE — Progress Notes (Signed)
Patient transported on BIPAP from PACU to 4N16. No complications noted. Vitals stable. RT aware of patient on unit.

## 2022-02-01 DIAGNOSIS — I639 Cerebral infarction, unspecified: Secondary | ICD-10-CM | POA: Diagnosis not present

## 2022-02-01 LAB — BASIC METABOLIC PANEL
Anion gap: 10 (ref 5–15)
BUN: 10 mg/dL (ref 8–23)
CO2: 21 mmol/L — ABNORMAL LOW (ref 22–32)
Calcium: 8.7 mg/dL — ABNORMAL LOW (ref 8.9–10.3)
Chloride: 110 mmol/L (ref 98–111)
Creatinine, Ser: 0.64 mg/dL (ref 0.44–1.00)
GFR, Estimated: >60 mL/min (ref >60)
Glucose, Bld: 118 mg/dL — ABNORMAL HIGH (ref 70–99)
Potassium: 3.4 mmol/L — ABNORMAL LOW (ref 3.5–5.1)
Sodium: 141 mmol/L (ref 135–145)

## 2022-02-01 LAB — CBC
HCT: 39.4 % (ref 36.0–46.0)
Hemoglobin: 13.1 g/dL (ref 12.0–15.0)
MCH: 30.7 pg (ref 26.0–34.0)
MCHC: 33.2 g/dL (ref 30.0–36.0)
MCV: 92.3 fL (ref 80.0–100.0)
Platelets: 205 10*3/uL (ref 150–400)
RBC: 4.27 MIL/uL (ref 3.87–5.11)
RDW: 13 % (ref 11.5–15.5)
WBC: 6.4 10*3/uL (ref 4.0–10.5)
nRBC: 0 % (ref 0.0–0.2)

## 2022-02-01 LAB — HEPARIN LEVEL (UNFRACTIONATED)
Heparin Unfractionated: 0.12 IU/mL — ABNORMAL LOW (ref 0.30–0.70)
Heparin Unfractionated: 0.11 IU/mL — ABNORMAL LOW (ref 0.30–0.70)

## 2022-02-01 LAB — APTT: aPTT: 32 seconds (ref 24–36)

## 2022-02-01 MED ORDER — POTASSIUM CHLORIDE CRYS ER 20 MEQ PO TBCR
40.0000 meq | EXTENDED_RELEASE_TABLET | Freq: Once | ORAL | Status: AC
  Administered 2022-02-01: 40 meq via ORAL
  Filled 2022-02-01: qty 2

## 2022-02-01 MED ORDER — LOSARTAN POTASSIUM 50 MG PO TABS
50.0000 mg | ORAL_TABLET | Freq: Every day | ORAL | Status: DC
  Administered 2022-02-01 – 2022-02-04 (×4): 50 mg via ORAL
  Filled 2022-02-01 (×4): qty 1

## 2022-02-01 NOTE — Progress Notes (Addendum)
STROKE TEAM PROGRESS NOTE   INTERVAL HISTORY Cleviprex 3mg  and heparin drip  Eyes open, tracks, Right facial, no aphasia. Right arm and leg antigravity but with drift, ataxia on right hand. Decreased sensation on right side  Blood pressure adequately controlled.  Vitals:   02/01/22 0500 02/01/22 0600 02/01/22 0700 02/01/22 0800  BP: (!) 114/58 (!) 121/106 (!) 141/77 133/63  Pulse: 67 69 67 68  Resp: 18 18 18 20   Temp:      TempSrc:      SpO2: 96% 98% 96% 96%  Weight:      Height:       CBC:  Recent Labs  Lab 01/27/22 1352 01/31/22 0320 01/31/22 1331 01/31/22 1549 02/01/22 0011  WBC 7.1 7.9  --   --  6.4  NEUTROABS 4.4  --   --   --   --   HGB 14.9 14.0   < > 13.6 13.1  HCT 44.4 40.8   < > 40.0 39.4  MCV 92.7 92.1  --   --  92.3  PLT 253 214  --   --  205   < > = values in this interval not displayed.    Basic Metabolic Panel:  Recent Labs  Lab 01/31/22 0320 01/31/22 1331 01/31/22 1549 02/01/22 0011  NA 141   < > 142 141  K 3.0*   < > 3.5 3.4*  CL 112*  --   --  110  CO2 22  --   --  21*  GLUCOSE 107*  --   --  118*  BUN 12  --   --  10  CREATININE 0.79  --   --  0.64  CALCIUM 8.3*  --   --  8.7*   < > = values in this interval not displayed.    Lipid Panel:  Recent Labs  Lab 01/28/22 0319  CHOL 226*  TRIG 253*  HDL 35*  CHOLHDL 6.5  VLDL 51*  LDLCALC 140*    HgbA1c:  Recent Labs  Lab 01/28/22 0319  HGBA1C 5.5     IMAGING past 24 hours CT HEAD WO CONTRAST (01/30/22)  Result Date: 01/31/2022 CLINICAL DATA:  Follow-up examination for stroke. EXAM: CT HEAD WITHOUT CONTRAST TECHNIQUE: Contiguous axial images were obtained from the base of the skull through the vertex without intravenous contrast. RADIATION DOSE REDUCTION: This exam was performed according to the departmental dose-optimization program which includes automated exposure control, adjustment of the mA and/or kV according to patient size and/or use of iterative reconstruction technique.  COMPARISON:  Prior CT from 01/27/2022 and MRI from 01/30/2022. FINDINGS: Brain: Sequelae of interval stenting at the basilar artery. 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 No associated hemorrhage or mass effect. Now seen are 8 a few additional small vaulting left cerebellar infarcts (series 3, images 8, 7). No other evidence for acute large vessel territory infarct. No acute intracranial hemorrhage. No mass lesion or midline shift. No hydrocephalus or extra-axial fluid collection. Underlying chronic microvascular ischemic disease noted. 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 Vascular: Interval stent placement at the basilar artery. Calcified atherosclerosis about the skull base. No abnormal hyperdense vessel. Skull: Scalp soft tissues and calvarium demonstrate no new finding. Sinuses/Orbits: Globes orbital soft tissues demonstrate no acute finding. Paranasal sinuses are largely clear. Small chronic right mastoid effusion, similar to prior. Other: None. IMPRESSION: 1. Sequelae of interval stenting at the basilar artery. 2. 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 No  associated hemorrhage or mass effect. 3. Interval development of a few additional small acute left cerebellar infarcts. No acute intracranial hemorrhage. 4. 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 Electronically Signed   By: Jeannine Boga M.D.   On: 01/31/2022 21:00   DG CHEST PORT 1 VIEW  Result Date: 01/31/2022 CLINICAL DATA:  CVA.  Numbness. EXAM: PORTABLE CHEST 1 VIEW COMPARISON:  08/24/2021 FINDINGS: Stable enlarged cardiac silhouette and large hiatal hernia. Tortuous and calcified thoracic aorta. Interval patchy density at the left lung base and minimal patchy density at the right lung base. Interval small amount of linear atelectasis in the right mid lung zone. Interval minimal right pleural effusion and  possible small left pleural effusion. Diffuse osteopenia. IMPRESSION: 1. Interval patchy atelectasis or pneumonia at the left lung base and minimal patchy atelectasis or pneumonia at the right lung base. 2. Interval minimal right pleural effusion and possible small left pleural effusion. 3. Stable cardiomegaly and large hiatal hernia. Electronically Signed   By: Claudie Revering M.D.   On: 01/31/2022 13:44    PHYSICAL EXAM  Temp:  [97.2 F (36.2 C)-99.7 F (37.6 C)] 98.7 F (37.1 C) (11/04 0400) Pulse Rate:  [62-78] 68 (11/04 0800) Resp:  [12-24] 20 (11/04 0800) BP: (97-151)/(52-106) 133/63 (11/04 0800) SpO2:  [94 %-100 %] 96 % (11/04 0800) Arterial Line BP: (149-158)/(64-70) 149/64 (11/03 1400) FiO2 (%):  [40 %-60 %] 40 % (11/03 1600)  General - Well nourished, well developed, in no apparent distress. Cardiovascular - Regular rhythm and rate, not in afib.  Mental Status -  Level of arousal and orientation to time, place, and person were intact. Language including expression, naming, repetition, comprehension was assessed and found intact. Fund of Knowledge was assessed and was intact.  Cranial Nerves II - XII - II - Visual field intact OU. III, IV, VI - Extraocular movements intact. V - Facial sensation decreased on the right VII - right facial VIII - Hearing & vestibular intact bilaterally. X - Palate elevates symmetrically. XI - Chin turning & shoulder shrug intact bilaterally. XII - Tongue protrusion intact.  Motor Strength - Left is 5/5, right arm and leg 4/5 with slight drift.  Bulk was normal and fasciculations were absent.   Motor Tone - Muscle tone was assessed at the neck and appendages and was normal.  Reflexes - The patient's reflexes were symmetrical in all extremities and she had no pathological reflexes.  Sensory - Light touch, temperature/pinprick were assessed and were decreased on the right UE and LE.   Coordination - The patient had normal movements in the left  hand and foot with no ataxia or dysmetria, but dysmetria on the R FTN and ataxia on the R HTS.  Tremor was absent.  Gait and Station - deferred.   ASSESSMENT/PLAN Ms. KINLEE GARRISON is a 76 y.o. female presenting with right sided stuttering sensory deficits starting 3 days ago that then became persistent; the 3rd episode's symptoms have not remitted. She was found to have subtle right hemiparesis, and sensory ataxia causing gait difficulties when arrived at [facility].her exam is limited @ this time , however when she arrived @ [place] her exam reveale right hemiataxia, subtle righ sided weakness and right sided hypoesthesia her symptoms were concerning for subcortical left hemispheric ischemic infarction obscured by severe small vessel disease on head CT Scan. MRI Brain w/o contrast (today) reveals small infarct in left thalamus/posterior limb of the internal capsule  w/o hemorrhage or mass effect. Small remote infarcts and background w/ advanced chronic small vessel ischemic changes. She is now s/p cerebral angiogram showing a extreme tortuosity of major neck arteries ,Severe tortuosity of the proximal basilar artery resulting in 85% stenosis.  Ulcerated plaque in the left carotid bulb.  No significant stenosis.  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 Large vessel disease from BA stenosis CT no acute abnormality.   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   MRI showed left lateral thalamic infarct.   Cerebral angiogram today showed extreme tortuosity of major neck arteries, severe tortuosity of the proximal basilar artery resulting in 85% stenosis, Ulcerated plaque in the left carotid bulb.  No significant stenosis.  Bilateral fetal PCAs with severe atherosclerotic disease, greater  on the left side where there is moderate stenosis at the P2 segment.  EF 70 to 75%.   LDL 140, TG 253 A1c 5.5.   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   Cerebral angiogram today showed extreme tortuosity of major neck arteries, severe tortuosity of the proximal basilar artery resulting in 85% stenosis. Pt agreed with BA stenting today with Dr. Sherlon Handing  Afib On eliqiis at home Current on hold for BA stent May consider to resume eliquis 24 h post stenting  Hypertension Stable On cleviprex drip wean as tolerated  On Norvasc 5mg   Long term BP goal normotensive  Hyperlipidemia Home meds:  none  LDL 140, goal < 70 Now on lipitor 40 Continue statin at discharge  Other Stroke Risk Factors Advanced age Hx of TIA  Other Active Problems Hypokalemia K 3.4 replace. Check in am   Hospital day # 5  DNP, [id number]    I have personally obtained history,examined this patient, reviewed notes, independently viewed imaging studies, participated in medical decision making and plan of care.ROS completed by me personally and pertinent positives fully documented  I have made any additions or clarifications directly to the above note. Agree with note above.  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  Continue strict blood pressure control as per postbanding protocol.  Mobilize out of bed.  Therapy consults.  Continue dual antiplatelet therapy aspirin and Brilinta. This patient is critically ill and at significant risk of neurological worsening, death and care requires constant monitoring of vital signs, hemodynamics,respiratory and cardiac monitoring, extensive review of multiple databases, frequent neurological assessment,  discussion with family, other specialists and medical decision making of high complexity.I have made any additions or clarifications directly to the above note.This critical care time does not reflect procedure time, or teaching time or supervisory time of PA/NP/Med Resident etc but could involve care discussion time.  I spent 30 minutes of neurocritical care time  in the care of  this patient.     Gevena Mart, MD Medical Director [facility] Pager: [phone] 02/01/2022 3:11 PM   To contact Stroke Continuity provider, please refer to 13/06/2021. After hours, contact General Neurology

## 2022-02-01 NOTE — Progress Notes (Signed)
PROGRESS NOTE    Anita Fletcher  UVO:[id number] DOB: 01-29-1946 DOA: 01/27/2022 PCP: Patient, No Pcp Per   Brief Narrative:  76 year old female with a history of paroxysmal atrial fibrillation on anticoagulation, hypertension, prior history of TIA, presents to the hospital with right arm, right face, right leg numbness.  CT head indicated possible CVA.  She is being admitted to [facility] for further evaluation by neurology and MRI brain. Anticipating transfer today and Neurology team aware.  Assessment & Plan:   Principal Problem:   Acute CVA (cerebrovascular accident) [facility]) Active Problems:   Paroxysmal atrial fibrillation (HCC)   HTN (hypertension)   Intracranial atherosclerosis  Assessment and Plan:   Acute CVA Recurrent episode 11/3 -Confirmed on MRI that required sedation today 01-30-22 -Recurrent symptoms overnight, repeat MRI unable to be performed given patient's need for sedation, CT head repeated showing interval development of additional small acute left cerebellar infarcts without hemorrhage -Neurology/vascular following, appreciate insight recommendations -Likely secondary to below -Right sided numbness improving but not yet resolved -PT OT to evaluate once patient has returned to the floor for procedure -Disposition pending above   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  -Basilar artery stent placement 01/31/2022 - tolerated well -Acute recurrent symptoms as above overnight with changes on CT noted as above  Acute respiratory insufficiency with hypoxia post operatively -Initially on bipap - weaned to [place] -Continue to wean back to room air(her baseline)  Hypertension -Resume home medications in the next 24 hours   Paroxysmal atrial fibrillation -Currently in sinus rhythm -Reported compliance with Eliquis prior to admission -Currently on aspirin and Plavix statin -likely to resume Eliquis and possibly low-dose aspirin per neurology at  discharge - lovenox ongoing for now   Profound Anxiety/Claustrophobia -Severe claustrophobia at baseline, has difficulty even riding in elevators -Xanax ordered as needed for anxiety -MRI completed with sedation, appreciate radiology and anesthesia's assistance     DVT prophylaxis: enoxaparin   Code Status: Full code Family Communication: Discussed with patient Disposition Plan: Status is: Inpatient Remains inpatient appropriate because: Continued work-up in the setting of acute stroke   Consultants:  Neurology   Antimicrobials:   None   Subjective: Overnight patient had worsening weakness facial droop repeat CT shows interval cerebellar infarcts.  Patient symptoms have resolved at this point, her only request today is for clearance to take p.o. which is certainly reasonable.  Otherwise denies headache fevers chills nausea vomiting diarrhea constipation chest pain or shortness of breath.  Objective: Vitals:   02/01/22 0430 02/01/22 0500 02/01/22 0600 02/01/22 0700  BP: 129/71 (!) 114/58 (!) 121/106 (!) 141/77  Pulse: 70 67 69 67  Resp: (!) _0 Temp:      TempSrc:      SpO2: 95% 96% 98% 96%  Weight:      Height:        Intake/Output Summary (Last 24 hours) at 02/01/2022 0758 Last data filed at 02/01/2022 0700 Gross per 24 hour  Intake 228.21 ml  Output 825 ml  Net -596.79 ml    Filed Weights   01/27/22 1300 01/30/22 1121  Weight: 76.2 kg 74.8 kg    Examination:  General:  Pleasantly resting in bed, No acute distress. HEENT:  Normocephalic atraumatic.  Sclerae nonicteric, noninjected.  Extraocular movements intact bilaterally. Neck:  Without mass or deformity.  Trachea is midline. Lungs:  Clear to auscultate bilaterally without rhonchi, wheeze, or rales. Heart:  Regular rate and rhythm.  Without murmurs, rubs,  or gallops. Abdomen:  Soft, nontender, nondistended.  Without guarding or rebound. Extremities: Without cyanosis, clubbing, edema, or obvious  deformity. Vascular:  Dorsalis pedis and posterior tibial pulses palpable bilaterally. Skin:  Warm and dry, no erythema, no ulcerations.   Data Reviewed: I have personally reviewed following labs and imaging studies  CBC: Recent Labs  Lab 01/27/22 1352 01/31/22 0320 01/31/22 1331 01/31/22 1549 02/01/22 0011  WBC 7.1 7.9  --   --  6.4  NEUTROABS 4.4  --   --   --   --   HGB 14.9 14.0 13.6 13.6 13.1  HCT 44.4 40.8 40.0 40.0 39.4  MCV 92.7 92.1  --   --  92.3  PLT 253 214  --   --  161    Basic Metabolic Panel: Recent Labs  Lab 01/27/22 1352 01/31/22 0320 01/31/22 1331 01/31/22 1549 02/01/22 0011  NA 141 141 143 142 141  K 3.6 3.0* 3.5 3.5 3.4*  CL 107 112*  --   --  110  CO2 26 22  --   --  21*  GLUCOSE 82 107*  --   --  118*  BUN 11 12  --   --  10  CREATININE 0.76 0.79  --   --  0.64  CALCIUM 9.0 8.3*  --   --  8.7*    GFR: Estimated Creatinine Clearance: 60.5 mL/min (by C-G formula based on SCr of 0.64 mg/dL). Liver Function Tests: Recent Labs  Lab 01/27/22 1352  AST 14*  ALT 15  ALKPHOS 56  BILITOT 0.5  PROT 6.8  ALBUMIN 3.8    No results for input(s): "LIPASE", "AMYLASE" in the last 168 hours. No results for input(s): "AMMONIA" in the last 168 hours. Coagulation Profile: Recent Labs  Lab 01/30/22 0354  INR 1.1    Cardiac Enzymes: No results for input(s): "CKTOTAL", "CKMB", "CKMBINDEX", "TROPONINI" in the last 168 hours. BNP (last 3 results) No results for input(s): "PROBNP" in the last 8760 hours. HbA1C: No results for input(s): "HGBA1C" in the last 72 hours.  CBG: Recent Labs  Lab 01/27/22 1354 01/31/22 1827  GLUCAP 77 136*    Lipid Profile: No results for input(s): "CHOL", "HDL", "LDLCALC", "TRIG", "CHOLHDL", "LDLDIRECT" in the last 72 hours.  Thyroid Function Tests: No results for input(s): "TSH", "T4TOTAL", "FREET4", "T3FREE", "THYROIDAB" in the last 72 hours. Anemia Panel: No results for input(s): "VITAMINB12", "FOLATE",  "FERRITIN", "TIBC", "IRON", "RETICCTPCT" in the last 72 hours. Sepsis Labs: No results for input(s): "PROCALCITON", "LATICACIDVEN" in the last 168 hours.  No results found for this or any previous visit (from the past 240 hour(s)).       Radiology Studies: CT HEAD WO CONTRAST (5MM)  Result Date: 01/31/2022 CLINICAL DATA:  Follow-up examination for stroke. EXAM: CT HEAD WITHOUT CONTRAST TECHNIQUE: Contiguous axial images were obtained from the base of the skull through the vertex without intravenous contrast. RADIATION DOSE REDUCTION: This exam was performed according to the departmental dose-optimization program which includes automated exposure control, adjustment of the mA and/or kV according to patient size and/or use of iterative reconstruction technique. COMPARISON:  Prior CT from 01/27/2022 and MRI from 01/30/2022. FINDINGS: Brain: Sequelae of interval stenting at the basilar artery. 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 No associated hemorrhage or mass effect. Now seen are 8 a few additional small vaulting left cerebellar infarcts (series 3, images 8, 7). No other evidence for acute large vessel territory infarct. No acute intracranial hemorrhage. No mass  lesion or midline shift. No hydrocephalus or extra-axial fluid collection. Underlying chronic microvascular ischemic disease noted. 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 Vascular: Interval stent placement at the basilar artery. Calcified atherosclerosis about the skull base. No abnormal hyperdense vessel. Skull: Scalp soft tissues and calvarium demonstrate no new finding. Sinuses/Orbits: Globes orbital soft tissues demonstrate no acute finding. Paranasal sinuses are largely clear. Small chronic right mastoid effusion, similar to prior. Other: None. IMPRESSION: 1. Sequelae of interval stenting at the basilar artery. 2. 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 No associated  hemorrhage or mass effect. 3. Interval development of a few additional small acute left cerebellar infarcts. No acute intracranial hemorrhage. 4. 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 Electronically Signed   By: Jeannine Boga M.D.   On: 01/31/2022 21:00   DG CHEST PORT 1 VIEW  Result Date: 01/31/2022 CLINICAL DATA:  CVA.  Numbness. EXAM: PORTABLE CHEST 1 VIEW COMPARISON:  08/24/2021 FINDINGS: Stable enlarged cardiac silhouette and large hiatal hernia. Tortuous and calcified thoracic aorta. Interval patchy density at the left lung base and minimal patchy density at the right lung base. Interval small amount of linear atelectasis in the right mid lung zone. Interval minimal right pleural effusion and possible small left pleural effusion. Diffuse osteopenia. IMPRESSION: 1. Interval patchy atelectasis or pneumonia at the left lung base and minimal patchy atelectasis or pneumonia at the right lung base. 2. Interval minimal right pleural effusion and possible small left pleural effusion. 3. Stable cardiomegaly and large hiatal hernia. Electronically Signed   By: Claudie Revering M.D.   On: 01/31/2022 13:44   IR ANGIO INTRA EXTRACRAN SEL COM CAROTID INNOMINATE BILAT MOD SED  Result Date: 01/31/2022 INDICATION: SHAKEIA KRUS is a 76 y.o. female with PMH significant for HTN, atrial fibrillation, and TIA 10 years ago being seen today for symptomatic intracranial stenosis. The patient states that beginning on Sunday, 10/29 she has been having intermittent right sided numbness in her arms and legs which have lasted several minutes. The patient reports waking up the evening of 10/29 with numbness that has persisted until this time. She denies any muscle weakness, but endorses some difficulty with coordination, especially when walking. She comes to our service today for a diagnostic angiogram. EXAM: ULTRASOUND-GUIDED VASCULAR ACCESS DIAGNOSTIC CEREBRAL  ANGIOGRAM COMPARISON:  CT angiogram of the head and neck January 27, 2022. MEDICATIONS: 20 mg of hydralazine intravenous. ANESTHESIA/SEDATION: Moderate (conscious) sedation was employed during this procedure. A total of Versed 0.5 mg and Fentanyl 50 mcg was administered intravenously by the radiology nurse. Total intra-service moderate Sedation Time: 55 minutes. The patient's level of consciousness and vital signs were monitored continuously by radiology nursing throughout the procedure under my direct supervision. CONTRAST:  75 mL of Omnipaque 300 milligram/mL FLUOROSCOPY: Radiation Exposure Index (as provided by the fluoroscopic device): 4431 mGy Kerma COMPLICATIONS: None immediate. TECHNIQUE: Informed written consent was obtained from the patient after a thorough discussion of the procedural risks, benefits and alternatives. All questions were addressed. Maximal Sterile Barrier Technique was utilized including caps, mask, sterile gowns, sterile gloves, sterile drape, hand hygiene and skin antiseptic. A timeout was performed prior to the initiation of the procedure. The right groin was prepped and draped in the usual sterile fashion. Using a micropuncture kit and the modified Seldinger technique, access was gained to the right common femoral artery and a 5 French sheath was placed. Real-time ultrasound guidance was utilized for vascular access  including the acquisition of a permanent ultrasound image documenting patency of the accessed vessel. Under fluoroscopy, a 5 [place] Berenstein 2 catheter was navigated over a 0.035" Terumo Glidewire into the aortic arch. The catheter was placed into the left subclavian artery. Frontal and lateral angiograms of the neck were obtained. Using road map guidance, the catheter was placed into the left vertebral artery. Frontal and lateral angiograms of the neck were obtained. The catheter was then placed into the right common carotid artery. Frontal and lateral angiograms of the  neck were obtained followed by angiograms with frontal, lateral, magnified right anterior oblique and magnified lateral views of the head. Next, the catheter was placed into the left common carotid artery. Frontal and lateral angiograms of the neck were obtained followed by frontal and lateral angiograms of the head. The catheter was subsequently withdrawn. Right common femoral artery angiogram was obtained in right anterior oblique view. The puncture is at the level of the common femoral artery. The sheath was exchanged over the wire for a Perclose ProGlide which was utilized for access closure. Immediate hemostasis was achieved. FINDINGS: Left subclavian angiograms: Atherosclerotic changes of the left subclavian artery distal to the thyrocervical trunk origin with mild stenosis. Increased tortuosity of the left vertebral artery, severe at the V1 segment and mild at the V2 segment, without hemodynamically significant stenosis. Left vertebral artery angiograms: Atherosclerotic changes of the intracranial left vertebral artery with mild stenosis. 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 The right vertebral artery V4 segment is seen via contrast reflux and shows mild atherosclerotic changes without hemodynamically significant stenosis. No opacification of the right ACA vascular tree related to origin directly from the right ICA (fetal PCA). Contrast opacification of the P1 segment of the left posterior cerebral artery is seen with minimal contrast opacification of the V2 segment due to washout from the left posterior communicating artery. No aneurysms or abnormally high-flow, early draining veins are seen. No regions of abnormal hypervascularity are noted. The visualized dural sinuses are patent. Right CCA angiograms: Cervical angiograms show extreme tortuosity of the right common carotid artery and proximal right internal carotid artery. There are no  hemodynamically significant stenoses. Right CCA angiograms-cranial views: Images are degraded by motion. There is brisk vascular contrast filling of the right ACA and MCA vascular trees. There is also brisk contrast opacification of the right PCA vascular tree (fetal PCA). Contrast opacification of the left ACA vascular tree is also seen with rapid washout. There is luminal irregularity along the right carotid siphon and right MCA vascular tree consistent with mild atherosclerotic disease without high-grade stenosis. Prominent luminal irregularity is seen along right PCA vascular tree with multifocal areas of mild stenosis, more pronounced at the P2 segment. No aneurysms or abnormally high-flow, early draining veins are seen. The visualized dural sinuses are patent. The visualized branches of the right external carotid artery are unremarkable. Left CCA angiograms: Cervical angiograms show prominent tortuosity of the left common carotid artery and proximal left internal carotid artery. Atherosclerotic changes in the left carotid bulb with an ulcerated plaque. There is no significant stenosis. Left CCA angiograms-cranial views: Images are degraded by motion. Luminal irregularity along the right carotid siphon, right ACA and right MCA vascular trees are noted, consistent with intracranial atherosclerotic disease. There is no significant stenosis in the intracranial right ICA. Multifocal areas of mild stenosis are noted along the left MCA vascular tree. Short segment of severe stenosis is seen in the left A1/ACA  segment and moderate stenosis at the A3 segment. Severe atherosclerotic changes are noted in the left PCA vascular tree with moderate stenosis at the posterior communicating artery, severe stenosis in the mid P2/PCA segment and multifocal areas of mild stenosis along the PCA vascular tree. No aneurysms or abnormally high-flow, early draining veins are seen. No regions of abnormal hypervascularity are noted. The  visualized dural sinuses are patent. The visualized branches of the left external carotid artery are unremarkable. Right common femoral artery angiograms: The access is at the level of the mid right common femoral artery. The artery has mild atherosclerotic changes without significant stenosis, adequate for closure device. PROCEDURE: No intervention performed. IMPRESSION: 1. 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 2. Atherosclerotic changes of the anterior circulation with short segment of severe stenosis at the left A1/ACA segment and moderate stenosis at the left A3/ACA segment. 3. Extreme tortuosity of the major neck arteries, may be related to longstanding hypertension. 4. Ulcerated plaque in the left carotid bulb without stenosis. 5. No stenosis of the right carotid bifurcation. PLAN: Findings to be discussed with primary team for management decision. Electronically Signed   By: Pedro Earls M.D.   On: 01/31/2022 11:05   IR ANGIO VERTEBRAL SEL VERTEBRAL UNI L MOD SED  Result Date: 01/31/2022 INDICATION: DARIEN MIGNOGNA is a 76 y.o. female with PMH significant for HTN, atrial fibrillation, and TIA 10 years ago being seen today for symptomatic intracranial stenosis. The patient states that beginning on Sunday, 10/29 she has been having intermittent right sided numbness in her arms and legs which have lasted several minutes. The patient reports waking up the evening of 10/29 with numbness that has persisted until this time. She denies any muscle weakness, but endorses some difficulty with coordination, especially when walking. She comes to our service today for a diagnostic angiogram. EXAM: ULTRASOUND-GUIDED VASCULAR ACCESS DIAGNOSTIC CEREBRAL ANGIOGRAM COMPARISON:  CT angiogram of the head and neck January 27, 2022. MEDICATIONS: 20 mg of hydralazine intravenous.  ANESTHESIA/SEDATION: Moderate (conscious) sedation was employed during this procedure. A total of Versed 0.5 mg and Fentanyl 50 mcg was administered intravenously by the radiology nurse. Total intra-service moderate Sedation Time: 55 minutes. The patient's level of consciousness and vital signs were monitored continuously by radiology nursing throughout the procedure under my direct supervision. CONTRAST:  75 mL of Omnipaque 300 milligram/mL FLUOROSCOPY: Radiation Exposure Index (as provided by the fluoroscopic device): 5885 mGy Kerma COMPLICATIONS: None immediate. TECHNIQUE: Informed written consent was obtained from the patient after a thorough discussion of the procedural risks, benefits and alternatives. All questions were addressed. Maximal Sterile Barrier Technique was utilized including caps, mask, sterile gowns, sterile gloves, sterile drape, hand hygiene and skin antiseptic. A timeout was performed prior to the initiation of the procedure. The right groin was prepped and draped in the usual sterile fashion. Using a micropuncture kit and the modified Seldinger technique, access was gained to the right common femoral artery and a 5 French sheath was placed. Real-time ultrasound guidance was utilized for vascular access including the acquisition of a permanent ultrasound image documenting patency of the accessed vessel. Under fluoroscopy, a 5 [place] Berenstein 2 catheter was navigated over a 0.035" Terumo Glidewire into the aortic arch. The catheter was placed into the left subclavian artery. Frontal and lateral angiograms of the neck were obtained. Using road map guidance, the catheter was placed into  the left vertebral artery. Frontal and lateral angiograms of the neck were obtained. The catheter was then placed into the right common carotid artery. Frontal and lateral angiograms of the neck were obtained followed by angiograms with frontal, lateral, magnified right anterior oblique and magnified lateral  views of the head. Next, the catheter was placed into the left common carotid artery. Frontal and lateral angiograms of the neck were obtained followed by frontal and lateral angiograms of the head. The catheter was subsequently withdrawn. Right common femoral artery angiogram was obtained in right anterior oblique view. The puncture is at the level of the common femoral artery. The sheath was exchanged over the wire for a Perclose ProGlide which was utilized for access closure. Immediate hemostasis was achieved. FINDINGS: Left subclavian angiograms: Atherosclerotic changes of the left subclavian artery distal to the thyrocervical trunk origin with mild stenosis. Increased tortuosity of the left vertebral artery, severe at the V1 segment and mild at the V2 segment, without hemodynamically significant stenosis. Left vertebral artery angiograms: Atherosclerotic changes of the intracranial left vertebral artery with mild stenosis. 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 The right vertebral artery V4 segment is seen via contrast reflux and shows mild atherosclerotic changes without hemodynamically significant stenosis. No opacification of the right ACA vascular tree related to origin directly from the right ICA (fetal PCA). Contrast opacification of the P1 segment of the left posterior cerebral artery is seen with minimal contrast opacification of the V2 segment due to washout from the left posterior communicating artery. No aneurysms or abnormally high-flow, early draining veins are seen. No regions of abnormal hypervascularity are noted. The visualized dural sinuses are patent. Right CCA angiograms: Cervical angiograms show extreme tortuosity of the right common carotid artery and proximal right internal carotid artery. There are no hemodynamically significant stenoses. Right CCA angiograms-cranial views: Images are degraded by motion. There is brisk vascular  contrast filling of the right ACA and MCA vascular trees. There is also brisk contrast opacification of the right PCA vascular tree (fetal PCA). Contrast opacification of the left ACA vascular tree is also seen with rapid washout. There is luminal irregularity along the right carotid siphon and right MCA vascular tree consistent with mild atherosclerotic disease without high-grade stenosis. Prominent luminal irregularity is seen along right PCA vascular tree with multifocal areas of mild stenosis, more pronounced at the P2 segment. No aneurysms or abnormally high-flow, early draining veins are seen. The visualized dural sinuses are patent. The visualized branches of the right external carotid artery are unremarkable. Left CCA angiograms: Cervical angiograms show prominent tortuosity of the left common carotid artery and proximal left internal carotid artery. Atherosclerotic changes in the left carotid bulb with an ulcerated plaque. There is no significant stenosis. Left CCA angiograms-cranial views: Images are degraded by motion. Luminal irregularity along the right carotid siphon, right ACA and right MCA vascular trees are noted, consistent with intracranial atherosclerotic disease. There is no significant stenosis in the intracranial right ICA. Multifocal areas of mild stenosis are noted along the left MCA vascular tree. Short segment of severe stenosis is seen in the left A1/ACA segment and moderate stenosis at the A3 segment. Severe atherosclerotic changes are noted in the left PCA vascular tree with moderate stenosis at the posterior communicating artery, severe stenosis in the mid P2/PCA segment and multifocal areas of mild stenosis along the PCA vascular tree. No aneurysms or abnormally high-flow, early draining veins are seen. No regions of abnormal hypervascularity  are noted. The visualized dural sinuses are patent. The visualized branches of the left external carotid artery are unremarkable. Right common  femoral artery angiograms: The access is at the level of the mid right common femoral artery. The artery has mild atherosclerotic changes without significant stenosis, adequate for closure device. PROCEDURE: No intervention performed. IMPRESSION: 1. 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 2. Atherosclerotic changes of the anterior circulation with short segment of severe stenosis at the left A1/ACA segment and moderate stenosis at the left A3/ACA segment. 3. Extreme tortuosity of the major neck arteries, may be related to longstanding hypertension. 4. Ulcerated plaque in the left carotid bulb without stenosis. 5. No stenosis of the right carotid bifurcation. PLAN: Findings to be discussed with primary team for management decision. Electronically Signed   By: Pedro Earls M.D.   On: 01/31/2022 11:05   IR US Guide Vasc Access Right  Result Date: 01/31/2022 INDICATION: YANELLY CANTRELLE is a 76 y.o. female with PMH significant for HTN, atrial fibrillation, and TIA 10 years ago being seen today for symptomatic intracranial stenosis. The patient states that beginning on Sunday, 10/29 she has been having intermittent right sided numbness in her arms and legs which have lasted several minutes. The patient reports waking up the evening of 10/29 with numbness that has persisted until this time. She denies any muscle weakness, but endorses some difficulty with coordination, especially when walking. She comes to our service today for a diagnostic angiogram. EXAM: ULTRASOUND-GUIDED VASCULAR ACCESS DIAGNOSTIC CEREBRAL ANGIOGRAM COMPARISON:  CT angiogram of the head and neck January 27, 2022. MEDICATIONS: 20 mg of hydralazine intravenous. ANESTHESIA/SEDATION: Moderate (conscious) sedation was employed during this procedure. A total of Versed 0.5 mg and Fentanyl 50 mcg was  administered intravenously by the radiology nurse. Total intra-service moderate Sedation Time: 55 minutes. The patient's level of consciousness and vital signs were monitored continuously by radiology nursing throughout the procedure under my direct supervision. CONTRAST:  75 mL of Omnipaque 300 milligram/mL FLUOROSCOPY: Radiation Exposure Index (as provided by the fluoroscopic device): 9379 mGy Kerma COMPLICATIONS: None immediate. TECHNIQUE: Informed written consent was obtained from the patient after a thorough discussion of the procedural risks, benefits and alternatives. All questions were addressed. Maximal Sterile Barrier Technique was utilized including caps, mask, sterile gowns, sterile gloves, sterile drape, hand hygiene and skin antiseptic. A timeout was performed prior to the initiation of the procedure. The right groin was prepped and draped in the usual sterile fashion. Using a micropuncture kit and the modified Seldinger technique, access was gained to the right common femoral artery and a 5 French sheath was placed. Real-time ultrasound guidance was utilized for vascular access including the acquisition of a permanent ultrasound image documenting patency of the accessed vessel. Under fluoroscopy, a 5 [place] Berenstein 2 catheter was navigated over a 0.035" Terumo Glidewire into the aortic arch. The catheter was placed into the left subclavian artery. Frontal and lateral angiograms of the neck were obtained. Using road map guidance, the catheter was placed into the left vertebral artery. Frontal and lateral angiograms of the neck were obtained. The catheter was then placed into the right common carotid artery. Frontal and lateral angiograms of the neck were obtained followed by angiograms with frontal, lateral, magnified right anterior oblique and magnified lateral views of the head. Next, the catheter was placed into the left common carotid artery. Frontal and  lateral angiograms of the neck were  obtained followed by frontal and lateral angiograms of the head. The catheter was subsequently withdrawn. Right common femoral artery angiogram was obtained in right anterior oblique view. The puncture is at the level of the common femoral artery. The sheath was exchanged over the wire for a Perclose ProGlide which was utilized for access closure. Immediate hemostasis was achieved. FINDINGS: Left subclavian angiograms: Atherosclerotic changes of the left subclavian artery distal to the thyrocervical trunk origin with mild stenosis. Increased tortuosity of the left vertebral artery, severe at the V1 segment and mild at the V2 segment, without hemodynamically significant stenosis. Left vertebral artery angiograms: Atherosclerotic changes of the intracranial left vertebral artery with mild stenosis. 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 The right vertebral artery V4 segment is seen via contrast reflux and shows mild atherosclerotic changes without hemodynamically significant stenosis. No opacification of the right ACA vascular tree related to origin directly from the right ICA (fetal PCA). Contrast opacification of the P1 segment of the left posterior cerebral artery is seen with minimal contrast opacification of the V2 segment due to washout from the left posterior communicating artery. No aneurysms or abnormally high-flow, early draining veins are seen. No regions of abnormal hypervascularity are noted. The visualized dural sinuses are patent. Right CCA angiograms: Cervical angiograms show extreme tortuosity of the right common carotid artery and proximal right internal carotid artery. There are no hemodynamically significant stenoses. Right CCA angiograms-cranial views: Images are degraded by motion. There is brisk vascular contrast filling of the right ACA and MCA vascular trees. There is also brisk contrast opacification of the right PCA vascular tree  (fetal PCA). Contrast opacification of the left ACA vascular tree is also seen with rapid washout. There is luminal irregularity along the right carotid siphon and right MCA vascular tree consistent with mild atherosclerotic disease without high-grade stenosis. Prominent luminal irregularity is seen along right PCA vascular tree with multifocal areas of mild stenosis, more pronounced at the P2 segment. No aneurysms or abnormally high-flow, early draining veins are seen. The visualized dural sinuses are patent. The visualized branches of the right external carotid artery are unremarkable. Left CCA angiograms: Cervical angiograms show prominent tortuosity of the left common carotid artery and proximal left internal carotid artery. Atherosclerotic changes in the left carotid bulb with an ulcerated plaque. There is no significant stenosis. Left CCA angiograms-cranial views: Images are degraded by motion. Luminal irregularity along the right carotid siphon, right ACA and right MCA vascular trees are noted, consistent with intracranial atherosclerotic disease. There is no significant stenosis in the intracranial right ICA. Multifocal areas of mild stenosis are noted along the left MCA vascular tree. Short segment of severe stenosis is seen in the left A1/ACA segment and moderate stenosis at the A3 segment. Severe atherosclerotic changes are noted in the left PCA vascular tree with moderate stenosis at the posterior communicating artery, severe stenosis in the mid P2/PCA segment and multifocal areas of mild stenosis along the PCA vascular tree. No aneurysms or abnormally high-flow, early draining veins are seen. No regions of abnormal hypervascularity are noted. The visualized dural sinuses are patent. The visualized branches of the left external carotid artery are unremarkable. Right common femoral artery angiograms: The access is at the level of the mid right common femoral artery. The artery has mild atherosclerotic  changes without significant stenosis, adequate for closure device. PROCEDURE: No intervention performed. IMPRESSION: 1. 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 2. Atherosclerotic changes of the anterior circulation with short segment of severe stenosis at the left A1/ACA segment and moderate stenosis at the left A3/ACA segment. 3. Extreme tortuosity of the major neck arteries, may be related to longstanding hypertension. 4. Ulcerated plaque in the left carotid bulb without stenosis. 5. No stenosis of the right carotid bifurcation. PLAN: Findings to be discussed with primary team for management decision. Electronically Signed   By: Pedro Earls M.D.   On: 01/31/2022 11:05   MR BRAIN WO CONTRAST  Result Date: 01/30/2022 CLINICAL DATA:  Neuro deficit, stroke suspected. EXAM: MRI HEAD WITHOUT CONTRAST TECHNIQUE: Multiplanar, multiecho pulse sequences of the brain and surrounding structures were obtained without intravenous contrast. COMPARISON:  Noncontrast head CT 01/27/2022 FINDINGS: Brain: There is a small acute infarct in the left thalamus/posterior limb of the internal capsule without hemorrhage or mass effect. There is no other evidence of acute infarct. Background parenchymal volume is normal. The ventricles are normal in size. There is T2/FLAIR hyperintensity in the bilateral lentiform nuclei, thalami, and right caudate head consistent with prior ischemia. Additional patchy and confluent FLAIR signal abnormality in the supratentorial brain likely reflects underlying advanced chronic small-vessel ischemic change. There are scattered punctate chronic microhemorrhages, favored hypertensive in etiology. There is no mass lesion.  There is no mass effect or midline shift. Vascular: Normal flow voids. Skull and upper cervical spine: Normal marrow signal.  Sinuses/Orbits: The paranasal sinuses are clear. A right lens implant is noted. The globes and orbits are otherwise unremarkable. Other: There are bilateral mastoid effusions. IMPRESSION: 1. 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 2. Small remote infarcts and background advanced chronic small-vessel ischemic change as above. 3. Scattered punctate chronic microhemorrhages, favored hypertensive in etiology. These results will be called to the ordering clinician or representative by the Radiologist Assistant, and communication documented in the PACS or [employer]. Electronically Signed   By: Valetta Mole M.D.   On: 01/30/2022 14:16        Scheduled Meds:   stroke: early stages of recovery book   Does not apply Once   amLODipine  5 mg Oral Daily   aspirin  81 mg Oral Daily   atorvastatin  40 mg Oral Daily   Chlorhexidine Gluconate Cloth  6 each Topical Daily   ticagrelor  90 mg Oral BID     LOS: 5 days    Time spent: 35 minutes    Little Ishikawa, DO [facility]  If 7PM-7AM, please contact night-coverage [email or web address] 02/01/2022, 7:58 AM

## 2022-02-01 NOTE — Progress Notes (Cosign Needed Addendum)
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  Imaging showed small cerebellar stroke, likely associated with stenting procedure.  Patient says she feels worse than she did yesterday. Also states she wants to get home to roast her [place] and set up all her Christmas trees and garland.     Allergies: Codeine  Medications: Prior to Admission medications   Medication Sig Start Date End Date Taking? Authorizing Provider  apixaban (ELIQUIS) 5 MG TABS tablet Take 1 tablet (5 mg total) by mouth 2 (two) times daily. 09/23/21  Yes O'Neal, Cassie Freer, MD  losartan (COZAAR) 50 MG tablet Take 1 tablet (50 mg total) by mouth daily. 01/16/22 01/11/23 Yes O'Neal, Cassie Freer, MD  metoprolol tartrate (LOPRESSOR) 25 MG tablet Take 1 tablet (25 mg total) by mouth 2 (two) times daily. 09/24/21  Yes Minus Breeding, MD     Vital Signs: BP 135/75   Pulse 64   Temp 98.2 F (36.8 C) (Axillary)   Resp (!) 21   Ht 5' 3.5" (1.613 m)   Wt 165 lb (74.8 kg)   SpO2 94%   BMI 28.77 kg/m   Physical Exam Vitals reviewed.  Constitutional:      General: She is not in acute distress.    Appearance: She is ill-appearing.  HENT:     Head: Normocephalic and atraumatic.  Eyes:     Visual Fields: Right eye visual fields normal and left eye visual fields normal.  Cardiovascular:     Rate and Rhythm: Normal rate.     Pulses: Normal pulses.  Pulmonary:     Effort: Pulmonary effort is normal.  Abdominal:     General: Abdomen is flat.     Palpations: Abdomen is soft.  Musculoskeletal:     Cervical back: Neck supple.  Skin:    General: Skin is warm and dry.  Neurological:     Mental Status: She is alert and oriented to person, place, and time.     Motor:  Motor function is intact.     Comments: Left sided face droop and weakness throughout Good strength of all 4 extremities.     Imaging: CT HEAD WO CONTRAST (5MM)  Result Date: 01/31/2022 CLINICAL DATA:  Follow-up examination for stroke. EXAM: CT HEAD WITHOUT CONTRAST TECHNIQUE: Contiguous axial images were obtained from the base of the skull through the vertex without intravenous contrast. RADIATION DOSE REDUCTION: This exam was performed according to the departmental dose-optimization program which includes automated exposure control, adjustment of the mA and/or kV according to patient size and/or use of iterative reconstruction technique. COMPARISON:  Prior CT from 01/27/2022 and MRI from 01/30/2022. FINDINGS: Brain: Sequelae of interval stenting at the basilar artery. 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 No associated hemorrhage or mass effect. Now seen are 8 a few additional small vaulting left cerebellar infarcts (series 3, images 8, 7). No other evidence for acute large vessel territory infarct. No acute intracranial hemorrhage. No mass lesion or midline shift. No hydrocephalus or extra-axial fluid collection. Underlying chronic microvascular ischemic disease noted. 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 Vascular: Interval stent placement at the basilar artery. Calcified atherosclerosis about the skull base. No abnormal hyperdense vessel. Skull: Scalp soft tissues and calvarium demonstrate no new finding. Sinuses/Orbits: Globes  orbital soft tissues demonstrate no acute finding. Paranasal sinuses are largely clear. Small chronic right mastoid effusion, similar to prior. Other: None. IMPRESSION: 1. Sequelae of interval stenting at the basilar artery. 2. 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 No associated hemorrhage or mass effect. 3. Interval development of a few additional small acute left cerebellar infarcts. No acute intracranial  hemorrhage. 4. 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 Electronically Signed   By: Jeannine Boga M.D.   On: 01/31/2022 21:00   DG CHEST PORT 1 VIEW  Result Date: 01/31/2022 CLINICAL DATA:  CVA.  Numbness. EXAM: PORTABLE CHEST 1 VIEW COMPARISON:  08/24/2021 FINDINGS: Stable enlarged cardiac silhouette and large hiatal hernia. Tortuous and calcified thoracic aorta. Interval patchy density at the left lung base and minimal patchy density at the right lung base. Interval small amount of linear atelectasis in the right mid lung zone. Interval minimal right pleural effusion and possible small left pleural effusion. Diffuse osteopenia. IMPRESSION: 1. Interval patchy atelectasis or pneumonia at the left lung base and minimal patchy atelectasis or pneumonia at the right lung base. 2. Interval minimal right pleural effusion and possible small left pleural effusion. 3. Stable cardiomegaly and large hiatal hernia. Electronically Signed   By: Claudie Revering M.D.   On: 01/31/2022 13:44   IR ANGIO INTRA EXTRACRAN SEL COM CAROTID INNOMINATE BILAT MOD SED  Result Date: 01/31/2022 INDICATION: YVANA SAMONTE is a 76 y.o. female with PMH significant for HTN, atrial fibrillation, and TIA 10 years ago being seen today for symptomatic intracranial stenosis. The patient states that beginning on Sunday, 10/29 she has been having intermittent right sided numbness in her arms and legs which have lasted several minutes. The patient reports waking up the evening of 10/29 with numbness that has persisted until this time. She denies any muscle weakness, but endorses some difficulty with coordination, especially when walking. She comes to our service today for a diagnostic angiogram. EXAM: ULTRASOUND-GUIDED VASCULAR ACCESS DIAGNOSTIC CEREBRAL ANGIOGRAM COMPARISON:  CT angiogram of the head and neck January 27, 2022. MEDICATIONS: 20 mg of hydralazine intravenous.  ANESTHESIA/SEDATION: Moderate (conscious) sedation was employed during this procedure. A total of Versed 0.5 mg and Fentanyl 50 mcg was administered intravenously by the radiology nurse. Total intra-service moderate Sedation Time: 55 minutes. The patient's level of consciousness and vital signs were monitored continuously by radiology nursing throughout the procedure under my direct supervision. CONTRAST:  75 mL of Omnipaque 300 milligram/mL FLUOROSCOPY: Radiation Exposure Index (as provided by the fluoroscopic device): 3329 mGy Kerma COMPLICATIONS: None immediate. TECHNIQUE: Informed written consent was obtained from the patient after a thorough discussion of the procedural risks, benefits and alternatives. All questions were addressed. Maximal Sterile Barrier Technique was utilized including caps, mask, sterile gowns, sterile gloves, sterile drape, hand hygiene and skin antiseptic. A timeout was performed prior to the initiation of the procedure. The right groin was prepped and draped in the usual sterile fashion. Using a micropuncture kit and the modified Seldinger technique, access was gained to the right common femoral artery and a 5 French sheath was placed. Real-time ultrasound guidance was utilized for vascular access including the acquisition of a permanent ultrasound image documenting patency of the accessed vessel. Under fluoroscopy, a 5 [place] Berenstein 2 catheter was navigated over a 0.035" Terumo Glidewire into the aortic arch. The catheter was placed into the left subclavian artery. Frontal and lateral angiograms of the neck were obtained. Using road map  guidance, the catheter was placed into the left vertebral artery. Frontal and lateral angiograms of the neck were obtained. The catheter was then placed into the right common carotid artery. Frontal and lateral angiograms of the neck were obtained followed by angiograms with frontal, lateral, magnified right anterior oblique and magnified lateral  views of the head. Next, the catheter was placed into the left common carotid artery. Frontal and lateral angiograms of the neck were obtained followed by frontal and lateral angiograms of the head. The catheter was subsequently withdrawn. Right common femoral artery angiogram was obtained in right anterior oblique view. The puncture is at the level of the common femoral artery. The sheath was exchanged over the wire for a Perclose ProGlide which was utilized for access closure. Immediate hemostasis was achieved. FINDINGS: Left subclavian angiograms: Atherosclerotic changes of the left subclavian artery distal to the thyrocervical trunk origin with mild stenosis. Increased tortuosity of the left vertebral artery, severe at the V1 segment and mild at the V2 segment, without hemodynamically significant stenosis. Left vertebral artery angiograms: Atherosclerotic changes of the intracranial left vertebral artery with mild stenosis. 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 The right vertebral artery V4 segment is seen via contrast reflux and shows mild atherosclerotic changes without hemodynamically significant stenosis. No opacification of the right ACA vascular tree related to origin directly from the right ICA (fetal PCA). Contrast opacification of the P1 segment of the left posterior cerebral artery is seen with minimal contrast opacification of the V2 segment due to washout from the left posterior communicating artery. No aneurysms or abnormally high-flow, early draining veins are seen. No regions of abnormal hypervascularity are noted. The visualized dural sinuses are patent. Right CCA angiograms: Cervical angiograms show extreme tortuosity of the right common carotid artery and proximal right internal carotid artery. There are no hemodynamically significant stenoses. Right CCA angiograms-cranial views: Images are degraded by motion. There is brisk vascular  contrast filling of the right ACA and MCA vascular trees. There is also brisk contrast opacification of the right PCA vascular tree (fetal PCA). Contrast opacification of the left ACA vascular tree is also seen with rapid washout. There is luminal irregularity along the right carotid siphon and right MCA vascular tree consistent with mild atherosclerotic disease without high-grade stenosis. Prominent luminal irregularity is seen along right PCA vascular tree with multifocal areas of mild stenosis, more pronounced at the P2 segment. No aneurysms or abnormally high-flow, early draining veins are seen. The visualized dural sinuses are patent. The visualized branches of the right external carotid artery are unremarkable. Left CCA angiograms: Cervical angiograms show prominent tortuosity of the left common carotid artery and proximal left internal carotid artery. Atherosclerotic changes in the left carotid bulb with an ulcerated plaque. There is no significant stenosis. Left CCA angiograms-cranial views: Images are degraded by motion. Luminal irregularity along the right carotid siphon, right ACA and right MCA vascular trees are noted, consistent with intracranial atherosclerotic disease. There is no significant stenosis in the intracranial right ICA. Multifocal areas of mild stenosis are noted along the left MCA vascular tree. Short segment of severe stenosis is seen in the left A1/ACA segment and moderate stenosis at the A3 segment. Severe atherosclerotic changes are noted in the left PCA vascular tree with moderate stenosis at the posterior communicating artery, severe stenosis in the mid P2/PCA segment and multifocal areas of mild stenosis along the PCA vascular tree. No aneurysms or abnormally high-flow, early draining veins are  seen. No regions of abnormal hypervascularity are noted. The visualized dural sinuses are patent. The visualized branches of the left external carotid artery are unremarkable. Right common  femoral artery angiograms: The access is at the level of the mid right common femoral artery. The artery has mild atherosclerotic changes without significant stenosis, adequate for closure device. PROCEDURE: No intervention performed. IMPRESSION: 1. 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 2. Atherosclerotic changes of the anterior circulation with short segment of severe stenosis at the left A1/ACA segment and moderate stenosis at the left A3/ACA segment. 3. Extreme tortuosity of the major neck arteries, may be related to longstanding hypertension. 4. Ulcerated plaque in the left carotid bulb without stenosis. 5. No stenosis of the right carotid bifurcation. PLAN: Findings to be discussed with primary team for management decision. Electronically Signed   By: Pedro Earls M.D.   On: 01/31/2022 11:05   IR ANGIO VERTEBRAL SEL VERTEBRAL UNI L MOD SED  Result Date: 01/31/2022 INDICATION: NAVEYA ELLERMAN is a 76 y.o. female with PMH significant for HTN, atrial fibrillation, and TIA 10 years ago being seen today for symptomatic intracranial stenosis. The patient states that beginning on Sunday, 10/29 she has been having intermittent right sided numbness in her arms and legs which have lasted several minutes. The patient reports waking up the evening of 10/29 with numbness that has persisted until this time. She denies any muscle weakness, but endorses some difficulty with coordination, especially when walking. She comes to our service today for a diagnostic angiogram. EXAM: ULTRASOUND-GUIDED VASCULAR ACCESS DIAGNOSTIC CEREBRAL ANGIOGRAM COMPARISON:  CT angiogram of the head and neck January 27, 2022. MEDICATIONS: 20 mg of hydralazine intravenous. ANESTHESIA/SEDATION: Moderate (conscious) sedation was employed during this procedure. A total of Versed 0.5 mg and Fentanyl 50  mcg was administered intravenously by the radiology nurse. Total intra-service moderate Sedation Time: 55 minutes. The patient's level of consciousness and vital signs were monitored continuously by radiology nursing throughout the procedure under my direct supervision. CONTRAST:  75 mL of Omnipaque 300 milligram/mL FLUOROSCOPY: Radiation Exposure Index (as provided by the fluoroscopic device): 4259 mGy Kerma COMPLICATIONS: None immediate. TECHNIQUE: Informed written consent was obtained from the patient after a thorough discussion of the procedural risks, benefits and alternatives. All questions were addressed. Maximal Sterile Barrier Technique was utilized including caps, mask, sterile gowns, sterile gloves, sterile drape, hand hygiene and skin antiseptic. A timeout was performed prior to the initiation of the procedure. The right groin was prepped and draped in the usual sterile fashion. Using a micropuncture kit and the modified Seldinger technique, access was gained to the right common femoral artery and a 5 French sheath was placed. Real-time ultrasound guidance was utilized for vascular access including the acquisition of a permanent ultrasound image documenting patency of the accessed vessel. Under fluoroscopy, a 5 [place] Berenstein 2 catheter was navigated over a 0.035" Terumo Glidewire into the aortic arch. The catheter was placed into the left subclavian artery. Frontal and lateral angiograms of the neck were obtained. Using road map guidance, the catheter was placed into the left vertebral artery. Frontal and lateral angiograms of the neck were obtained. The catheter was then placed into the right common carotid artery. Frontal and lateral angiograms of the neck were obtained followed by angiograms with frontal, lateral, magnified right anterior oblique and magnified lateral views of the head. Next, the catheter was  placed into the left common carotid artery. Frontal and lateral angiograms of the neck  were obtained followed by frontal and lateral angiograms of the head. The catheter was subsequently withdrawn. Right common femoral artery angiogram was obtained in right anterior oblique view. The puncture is at the level of the common femoral artery. The sheath was exchanged over the wire for a Perclose ProGlide which was utilized for access closure. Immediate hemostasis was achieved. FINDINGS: Left subclavian angiograms: Atherosclerotic changes of the left subclavian artery distal to the thyrocervical trunk origin with mild stenosis. Increased tortuosity of the left vertebral artery, severe at the V1 segment and mild at the V2 segment, without hemodynamically significant stenosis. Left vertebral artery angiograms: Atherosclerotic changes of the intracranial left vertebral artery with mild stenosis. 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 The right vertebral artery V4 segment is seen via contrast reflux and shows mild atherosclerotic changes without hemodynamically significant stenosis. No opacification of the right ACA vascular tree related to origin directly from the right ICA (fetal PCA). Contrast opacification of the P1 segment of the left posterior cerebral artery is seen with minimal contrast opacification of the V2 segment due to washout from the left posterior communicating artery. No aneurysms or abnormally high-flow, early draining veins are seen. No regions of abnormal hypervascularity are noted. The visualized dural sinuses are patent. Right CCA angiograms: Cervical angiograms show extreme tortuosity of the right common carotid artery and proximal right internal carotid artery. There are no hemodynamically significant stenoses. Right CCA angiograms-cranial views: Images are degraded by motion. There is brisk vascular contrast filling of the right ACA and MCA vascular trees. There is also brisk contrast opacification of the right PCA vascular  tree (fetal PCA). Contrast opacification of the left ACA vascular tree is also seen with rapid washout. There is luminal irregularity along the right carotid siphon and right MCA vascular tree consistent with mild atherosclerotic disease without high-grade stenosis. Prominent luminal irregularity is seen along right PCA vascular tree with multifocal areas of mild stenosis, more pronounced at the P2 segment. No aneurysms or abnormally high-flow, early draining veins are seen. The visualized dural sinuses are patent. The visualized branches of the right external carotid artery are unremarkable. Left CCA angiograms: Cervical angiograms show prominent tortuosity of the left common carotid artery and proximal left internal carotid artery. Atherosclerotic changes in the left carotid bulb with an ulcerated plaque. There is no significant stenosis. Left CCA angiograms-cranial views: Images are degraded by motion. Luminal irregularity along the right carotid siphon, right ACA and right MCA vascular trees are noted, consistent with intracranial atherosclerotic disease. There is no significant stenosis in the intracranial right ICA. Multifocal areas of mild stenosis are noted along the left MCA vascular tree. Short segment of severe stenosis is seen in the left A1/ACA segment and moderate stenosis at the A3 segment. Severe atherosclerotic changes are noted in the left PCA vascular tree with moderate stenosis at the posterior communicating artery, severe stenosis in the mid P2/PCA segment and multifocal areas of mild stenosis along the PCA vascular tree. No aneurysms or abnormally high-flow, early draining veins are seen. No regions of abnormal hypervascularity are noted. The visualized dural sinuses are patent. The visualized branches of the left external carotid artery are unremarkable. Right common femoral artery angiograms: The access is at the level of the mid right common femoral artery. The artery has mild  atherosclerotic changes without significant stenosis, adequate for closure device. PROCEDURE: No intervention  performed. IMPRESSION: 1. 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 2. Atherosclerotic changes of the anterior circulation with short segment of severe stenosis at the left A1/ACA segment and moderate stenosis at the left A3/ACA segment. 3. Extreme tortuosity of the major neck arteries, may be related to longstanding hypertension. 4. Ulcerated plaque in the left carotid bulb without stenosis. 5. No stenosis of the right carotid bifurcation. PLAN: Findings to be discussed with primary team for management decision. Electronically Signed   By: Pedro Earls M.D.   On: 01/31/2022 11:05   IR US Guide Vasc Access Right  Result Date: 01/31/2022 INDICATION: BRIYONNA OMARA is a 76 y.o. female with PMH significant for HTN, atrial fibrillation, and TIA 10 years ago being seen today for symptomatic intracranial stenosis. The patient states that beginning on Sunday, 10/29 she has been having intermittent right sided numbness in her arms and legs which have lasted several minutes. The patient reports waking up the evening of 10/29 with numbness that has persisted until this time. She denies any muscle weakness, but endorses some difficulty with coordination, especially when walking. She comes to our service today for a diagnostic angiogram. EXAM: ULTRASOUND-GUIDED VASCULAR ACCESS DIAGNOSTIC CEREBRAL ANGIOGRAM COMPARISON:  CT angiogram of the head and neck January 27, 2022. MEDICATIONS: 20 mg of hydralazine intravenous. ANESTHESIA/SEDATION: Moderate (conscious) sedation was employed during this procedure. A total of Versed 0.5 mg and Fentanyl 50 mcg was administered intravenously by the radiology nurse. Total intra-service moderate Sedation Time: 55 minutes. The patient's  level of consciousness and vital signs were monitored continuously by radiology nursing throughout the procedure under my direct supervision. CONTRAST:  75 mL of Omnipaque 300 milligram/mL FLUOROSCOPY: Radiation Exposure Index (as provided by the fluoroscopic device): 1194 mGy Kerma COMPLICATIONS: None immediate. TECHNIQUE: Informed written consent was obtained from the patient after a thorough discussion of the procedural risks, benefits and alternatives. All questions were addressed. Maximal Sterile Barrier Technique was utilized including caps, mask, sterile gowns, sterile gloves, sterile drape, hand hygiene and skin antiseptic. A timeout was performed prior to the initiation of the procedure. The right groin was prepped and draped in the usual sterile fashion. Using a micropuncture kit and the modified Seldinger technique, access was gained to the right common femoral artery and a 5 French sheath was placed. Real-time ultrasound guidance was utilized for vascular access including the acquisition of a permanent ultrasound image documenting patency of the accessed vessel. Under fluoroscopy, a 5 [place] Berenstein 2 catheter was navigated over a 0.035" Terumo Glidewire into the aortic arch. The catheter was placed into the left subclavian artery. Frontal and lateral angiograms of the neck were obtained. Using road map guidance, the catheter was placed into the left vertebral artery. Frontal and lateral angiograms of the neck were obtained. The catheter was then placed into the right common carotid artery. Frontal and lateral angiograms of the neck were obtained followed by angiograms with frontal, lateral, magnified right anterior oblique and magnified lateral views of the head. Next, the catheter was placed into the left common carotid artery. Frontal and lateral angiograms of the neck were obtained followed by frontal and lateral angiograms of the head. The catheter was subsequently withdrawn. Right common femoral  artery angiogram was obtained in right anterior oblique view. The puncture is at the level of the common femoral artery. The sheath was exchanged over the wire for a  Perclose ProGlide which was utilized for access closure. Immediate hemostasis was achieved. FINDINGS: Left subclavian angiograms: Atherosclerotic changes of the left subclavian artery distal to the thyrocervical trunk origin with mild stenosis. Increased tortuosity of the left vertebral artery, severe at the V1 segment and mild at the V2 segment, without hemodynamically significant stenosis. Left vertebral artery angiograms: Atherosclerotic changes of the intracranial left vertebral artery with mild stenosis. 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 The right vertebral artery V4 segment is seen via contrast reflux and shows mild atherosclerotic changes without hemodynamically significant stenosis. No opacification of the right ACA vascular tree related to origin directly from the right ICA (fetal PCA). Contrast opacification of the P1 segment of the left posterior cerebral artery is seen with minimal contrast opacification of the V2 segment due to washout from the left posterior communicating artery. No aneurysms or abnormally high-flow, early draining veins are seen. No regions of abnormal hypervascularity are noted. The visualized dural sinuses are patent. Right CCA angiograms: Cervical angiograms show extreme tortuosity of the right common carotid artery and proximal right internal carotid artery. There are no hemodynamically significant stenoses. Right CCA angiograms-cranial views: Images are degraded by motion. There is brisk vascular contrast filling of the right ACA and MCA vascular trees. There is also brisk contrast opacification of the right PCA vascular tree (fetal PCA). Contrast opacification of the left ACA vascular tree is also seen with rapid washout. There is luminal irregularity  along the right carotid siphon and right MCA vascular tree consistent with mild atherosclerotic disease without high-grade stenosis. Prominent luminal irregularity is seen along right PCA vascular tree with multifocal areas of mild stenosis, more pronounced at the P2 segment. No aneurysms or abnormally high-flow, early draining veins are seen. The visualized dural sinuses are patent. The visualized branches of the right external carotid artery are unremarkable. Left CCA angiograms: Cervical angiograms show prominent tortuosity of the left common carotid artery and proximal left internal carotid artery. Atherosclerotic changes in the left carotid bulb with an ulcerated plaque. There is no significant stenosis. Left CCA angiograms-cranial views: Images are degraded by motion. Luminal irregularity along the right carotid siphon, right ACA and right MCA vascular trees are noted, consistent with intracranial atherosclerotic disease. There is no significant stenosis in the intracranial right ICA. Multifocal areas of mild stenosis are noted along the left MCA vascular tree. Short segment of severe stenosis is seen in the left A1/ACA segment and moderate stenosis at the A3 segment. Severe atherosclerotic changes are noted in the left PCA vascular tree with moderate stenosis at the posterior communicating artery, severe stenosis in the mid P2/PCA segment and multifocal areas of mild stenosis along the PCA vascular tree. No aneurysms or abnormally high-flow, early draining veins are seen. No regions of abnormal hypervascularity are noted. The visualized dural sinuses are patent. The visualized branches of the left external carotid artery are unremarkable. Right common femoral artery angiograms: The access is at the level of the mid right common femoral artery. The artery has mild atherosclerotic changes without significant stenosis, adequate for closure device. PROCEDURE: No intervention performed. IMPRESSION: 1. 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 2. Atherosclerotic changes of the anterior circulation with short segment of severe stenosis at the left A1/ACA segment and moderate stenosis at the left A3/ACA segment. 3. Extreme tortuosity of the major neck arteries, may be  related to longstanding hypertension. 4. Ulcerated plaque in the left carotid bulb without stenosis. 5. No stenosis of the right carotid bifurcation. PLAN: Findings to be discussed with primary team for management decision. Electronically Signed   By: Pedro Earls M.D.   On: 01/31/2022 11:05   MR BRAIN WO CONTRAST  Result Date: 01/30/2022 CLINICAL DATA:  Neuro deficit, stroke suspected. EXAM: MRI HEAD WITHOUT CONTRAST TECHNIQUE: Multiplanar, multiecho pulse sequences of the brain and surrounding structures were obtained without intravenous contrast. COMPARISON:  Noncontrast head CT 01/27/2022 FINDINGS: Brain: There is a small acute infarct in the left thalamus/posterior limb of the internal capsule without hemorrhage or mass effect. There is no other evidence of acute infarct. Background parenchymal volume is normal. The ventricles are normal in size. There is T2/FLAIR hyperintensity in the bilateral lentiform nuclei, thalami, and right caudate head consistent with prior ischemia. Additional patchy and confluent FLAIR signal abnormality in the supratentorial brain likely reflects underlying advanced chronic small-vessel ischemic change. There are scattered punctate chronic microhemorrhages, favored hypertensive in etiology. There is no mass lesion.  There is no mass effect or midline shift. Vascular: Normal flow voids. Skull and upper cervical spine: Normal marrow signal. Sinuses/Orbits: The paranasal sinuses are clear. A right lens implant is noted. The globes and orbits are otherwise unremarkable. Other:  There are bilateral mastoid effusions. IMPRESSION: 1. 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 2. Small remote infarcts and background advanced chronic small-vessel ischemic change as above. 3. Scattered punctate chronic microhemorrhages, favored hypertensive in etiology. These results will be called to the ordering clinician or representative by the Radiologist Assistant, and communication documented in the PACS or [employer]. Electronically Signed   By: Valetta Mole M.D.   On: 01/30/2022 14:16    Labs:  CBC: Recent Labs    08/24/21 1507 01/27/22 1352 01/31/22 0320 01/31/22 1331 01/31/22 1549 02/01/22 0011  WBC 9.4 7.1 7.9  --   --  6.4  HGB 16.3* 14.9 14.0 13.6 13.6 13.1  HCT 48.6* 44.4 40.8 40.0 40.0 39.4  PLT 276 253 214  --   --  205    COAGS: Recent Labs    01/30/22 0354 02/01/22 0011  INR 1.1  --   APTT  --  32    BMP: Recent Labs    08/24/21 1507 01/27/22 1352 01/31/22 0320 01/31/22 1331 01/31/22 1549 02/01/22 0011  NA 141 141 141 143 142 141  K 3.6 3.6 3.0* 3.5 3.5 3.4*  CL 109 107 112*  --   --  110  CO2 _0 --   --  21*  GLUCOSE 156* 82 107*  --   --  118*  BUN _1 --   --  10  CALCIUM 8.9 9.0 8.3*  --   --  8.7*  CREATININE 0.84 0.76 0.79  --   --  0.64  GFRNONAA >60 >60 >60  --   --  >60    LIVER FUNCTION TESTS: Recent Labs    08/24/21 1507 01/27/22 1352  BILITOT 0.6 0.5  AST 20 14*  ALT 15 15  ALKPHOS 58 56  PROT 6.8 6.8  ALBUMIN 3.9 3.8    Assessment and Plan:  CVA 2 days s/p diagnostic angiography 1 day s/p basilar artery stenting --complicated by small cerebellar stroke --changed Plavix to Brilinta --recommend when resuming Eliquis, having 1 day of overlap with Brilinta, Eliquis,  and ASA then just continue with Brilinta and Eliquis --will need 48mof/u imaging and clinic [place] in [place]  Since can't do MRI without GA, will order CTA. --further  management per primary team  Electronically Signed: HPasty Spillers PA 02/01/2022, 12:30 PM   I spent a total of 25 Minutes at the the patient's bedside AND on the patient's hospital floor or unit, greater than 50% of which was counseling/coordinating care for post basilar artery stenting care

## 2022-02-01 NOTE — Progress Notes (Signed)
ANTICOAGULATION CONSULT NOTE   Pharmacy Consult:  Heparin  Indication: Post interventional neuroradiology procedure  Allergies  Allergen Reactions   Codeine     Patient Measurements: Height: 5' 3.5" (161.3 cm) Weight: 74.8 kg (165 lb) IBW/kg (Calculated) : 53.55 Heparin Dosing Weight: 68 kg  Vital Signs: Temp: 98.7 F (37.1 C) (11/04 0400) Temp Source: Oral (11/04 0400) BP: 133/63 (11/04 0800) Pulse Rate: 68 (11/04 0800)  Labs: Recent Labs    01/30/22 0354 01/31/22 0320 01/31/22 0320 01/31/22 1331 01/31/22 1549 02/01/22 0011 02/01/22 1010  HGB  --  14.0   < > 13.6 13.6 13.1  --   HCT  --  40.8   < > 40.0 40.0 39.4  --   PLT  --  214  --   --   --  205  --   APTT  --   --   --   --   --  32  --   LABPROT 13.7  --   --   --   --   --   --   INR 1.1  --   --   --   --   --   --   HEPARINUNFRC  --   --   --   --   --  0.12* 0.11*  CREATININE  --  0.79  --   --   --  0.64  --    < > = values in this interval not displayed.     Estimated Creatinine Clearance: 60.5 mL/min (by C-G formula based on SCr of 0.64 mg/dL).   Medical History: Past Medical History:  Diagnosis Date   Acute CVA (cerebrovascular accident) ([place]) 01/27/2022   HTN (hypertension) 01/27/2022   TIA (transient ischemic attack)     Assessment: 33 YOF with history of Afib/CVA on Eliquis PTA, last dose 10/30 AM.  Patient presented with numbness to the right face, hand and lower extremity.  She is post interventional neuroradiology procedure and pharmacy consulted to manage IV heparin.  Heparin started at 500 units/hr in the PACU.  CBC stable.  Heparin level: 0.11 on 600 units/hr, no issues with infusion or overt s/sx of bleeding noted.   Goal of Therapy:  Heparin level 0.1-0.25 units/ml aPTT 47-61 seconds Monitor platelets by anticoagulation protocol: Yes   Plan:  Continue heparin IV 600u/hr.  Heparin scheduled to be discontinued on 11/4 @ 13:45.  Follow up transition back to home Eliquis.    Esmeralda Arthur, PharmD  Clinical Pharmacist 02/01/2022 11:14 AM Please check AMION for all [facility] numbers

## 2022-02-01 NOTE — Evaluation (Signed)
Clinical/Bedside Swallow Evaluation Patient Details  Name: Anita Fletcher MRN: [id number] Date of Birth: 06-Jun-1945  Today's Date: 02/01/2022 Time: SLP Start Time (ACUTE ONLY): 1207 SLP Stop Time (ACUTE ONLY): 2831 SLP Time Calculation (min) (ACUTE ONLY): 22 min  Past Medical History:  Past Medical History:  Diagnosis Date   Acute CVA (cerebrovascular accident) ([place]) 01/27/2022   HTN (hypertension) 01/27/2022   TIA (transient ischemic attack)    Past Surgical History:  Past Surgical History:  Procedure Laterality Date   EYE SURGERY     IR ANGIO INTRA EXTRACRAN SEL COM CAROTID INNOMINATE BILAT MOD SED  01/30/2022   IR ANGIO VERTEBRAL SEL VERTEBRAL UNI L MOD SED  01/30/2022   IR US GUIDE VASC ACCESS RIGHT  01/30/2022   RADIOLOGY WITH ANESTHESIA N/A 01/30/2022   Procedure: MRI WITH ANESTHESIA - BRAIN W/O CONSTRAST;  Surgeon: Radiologist, Medication, MD;  Location: [facility];  Service: Radiology;  Laterality: N/A;   HPI:  22 yof presented to [facility] ED with numbness to the right face, hand and lower extremity. Transferred to [facility] for further evaluation by neurology. MRI 11/2 showed small acute infarct in the left thalamus/posterior limb of the internal capsule without hemorrhage or mass effect. 11/2 cerebral angiogram showed severe basilar artery stenosis. 11/3 underwent basilar artery stent placement. S/P procedure presented with left upper and lower facial weakness, concerning for brainstem infarct.  Bedside swallow eval ordered.  PMHx TIA, HTN, afib.    Assessment / Plan / Recommendation  Clinical Impression  Pt participated in clinical swallowing assessment. She reported ongoing numbness right side of face.  Presented with upper and lower facial asymmetry on left. Tongue midline. Accepted ice chips, sips of water, bites of applesauce and cracker with only mild deficits in mastication, occasional oral residue post-swallow. She protected her airway well with no s/s of aspiration, even when taxing her  swallowing with sequential boluses of water. Recommend starting a dysphagia 3 diet (per Ms. Locklin's preference), thin liquids. Give meds whole in puree. SLP will follow briefly. SLP Visit Diagnosis: Dysphagia, oral phase (R13.11)    Aspiration Risk    minimal   Diet Recommendation   Dysphagia 1, thin liquids  Medication Administration: Whole meds with puree    Other  Recommendations Oral Care Recommendations: Oral care BID    Recommendations for follow up therapy are one component of a multi-disciplinary discharge planning process, led by the attending physician.  Recommendations may be updated based on patient status, additional functional criteria and insurance authorization.  Follow up Recommendations No SLP follow up                          Prognosis Prognosis for Safe Diet Advancement: Good      Swallow Study   General Date of Onset: 01/27/22 HPI: 33 yof presented to [facility] ED with numbness to the right face, hand and lower extremity. Transferred to [facility] for further evaluation by neurology. MRI 11/2 showed small acute infarct in the left thalamus/posterior limb of the internal capsule without hemorrhage or mass effect. 11/2 cerebral angiogram showed severe basilar artery stenosis. 11/3 underwent basilar artery stent placement. S/P procedure presented with left upper and lower facial weakness, concerning for brainstem infarct.  Bedside swallow eval ordered.  PMHx TIA, HTN, afib. Type of Study: Bedside Swallow Evaluation Previous Swallow Assessment: no Diet Prior to this Study: NPO Temperature Spikes Noted: No Respiratory Status: Nasal cannula History of Recent Intubation: Yes (for procedure) Behavior/Cognition: Alert;Cooperative;Pleasant  mood Oral Cavity Assessment: Within Functional Limits Oral Care Completed by SLP: Recent completion by staff Oral Cavity - Dentition: Adequate natural dentition Vision: Functional for self-feeding Self-Feeding Abilities: Needs  assist Patient Positioning: Upright in bed Baseline Vocal Quality: Normal Volitional Cough: Strong Volitional Swallow: Able to elicit    Oral/Motor/Sensory Function Overall Oral Motor/Sensory Function: Moderate impairment Facial ROM: Reduced left;Suspected CN VII (facial) dysfunction Facial Symmetry: Abnormal symmetry left;Suspected CN VII (facial) dysfunction Facial Strength: Reduced left;Suspected CN VII (facial) dysfunction Facial Sensation: Reduced right;Suspected CN V (Trigeminal) dysfunction Lingual ROM: Within Functional Limits Lingual Symmetry: Within Functional Limits Mandible: Within Functional Limits   Ice Chips Ice chips: Within functional limits   Thin Liquid Thin Liquid: Within functional limits    Nectar Thick Nectar Thick Liquid: Not tested   Honey Thick Honey Thick Liquid: Not tested   Puree Puree: Within functional limits   Solid     Solid: Within functional limits      Blenda Mounts Laurice 02/01/2022,12:47 PM   Marchelle Folks L. Samson Frederic, MA CCC/SLP Clinical Specialist - Acute Care SLP Acute Rehabilitation Services Office number [phone]

## 2022-02-01 NOTE — Progress Notes (Signed)
ANTICOAGULATION CONSULT NOTE   Pharmacy Consult:  Heparin  Indication: Post interventional neuroradiology procedure  Allergies  Allergen Reactions   Codeine     Patient Measurements: Height: 5' 3.5" (161.3 cm) Weight: 74.8 kg (165 lb) IBW/kg (Calculated) : 53.55 Heparin Dosing Weight: 68 kg  Vital Signs: Temp: 99.7 F (37.6 C) (11/04 0000) Temp Source: Oral (11/04 0000) BP: 127/75 (11/04 0000) Pulse Rate: 74 (11/04 0000)  Labs: Recent Labs    01/30/22 0354 01/31/22 0320 01/31/22 0320 01/31/22 1331 01/31/22 1549 02/01/22 0011  HGB  --  14.0   < > 13.6 13.6 13.1  HCT  --  40.8   < > 40.0 40.0 39.4  PLT  --  214  --   --   --  205  LABPROT 13.7  --   --   --   --   --   INR 1.1  --   --   --   --   --   HEPARINUNFRC  --   --   --   --   --  0.12*  CREATININE  --  0.79  --   --   --  0.64   < > = values in this interval not displayed.     Estimated Creatinine Clearance: 60.5 mL/min (by C-G formula based on SCr of 0.64 mg/dL).   Medical History: Past Medical History:  Diagnosis Date   Acute CVA (cerebrovascular accident) ([place]) 01/27/2022   HTN (hypertension) 01/27/2022   TIA (transient ischemic attack)     Assessment: 50 YOF with history of Afib/CVA on Eliquis PTA, last dose 10/30 AM.  Patient presented with numbness to the right face, hand and lower extremity.  She is post interventional neuroradiology procedure and pharmacy consulted to manage IV heparin.  Heparin started at 500 units/hr in the PACU.  CBC stable.  Heparin level: 0.12 on 600 units/hr, no issues with infusion or overt s/sx of bleeding per RN. CBC stable  Goal of Therapy:  Heparin level 0.1-0.25 units/ml aPTT 47-61 seconds Monitor platelets by anticoagulation protocol: Yes   Plan:  D/C Lovenox Continue heparin infusion at 600 units/hr Check 8 hr heparin level and aPTT F/u with resuming PTA Eliquis when possible   Georga Bora, PharmD Clinical Pharmacist 02/01/2022 2:20 AM Please  check AMION for all [place] numbers

## 2022-02-02 DIAGNOSIS — I639 Cerebral infarction, unspecified: Secondary | ICD-10-CM | POA: Diagnosis not present

## 2022-02-02 MED ORDER — HYDRALAZINE HCL 20 MG/ML IJ SOLN
10.0000 mg | INTRAMUSCULAR | Status: DC | PRN
  Administered 2022-02-02: 10 mg via INTRAVENOUS
  Filled 2022-02-02: qty 1

## 2022-02-02 MED ORDER — APIXABAN 5 MG PO TABS
5.0000 mg | ORAL_TABLET | Freq: Two times a day (BID) | ORAL | Status: DC
  Administered 2022-02-02 – 2022-02-04 (×5): 5 mg via ORAL
  Filled 2022-02-02 (×5): qty 1

## 2022-02-02 NOTE — Progress Notes (Addendum)
Inpatient Rehab Admissions Coordinator Note:   Per PT patient was screened for CIR candidacy by Decarlo Rivet Danford Bad, CCC-SLP. At this time, pt appears to be a potential candidate for CIR. I will place an order for rehab consult for full assessment, per our protocol. Please contact me with any questions.    Gayland Curry, [place], [place] Admissions Coordinator [phone] 02/02/22 12:58 PM

## 2022-02-02 NOTE — Plan of Care (Signed)
  Problem: Education: Goal: Knowledge of disease or condition will improve Outcome: Progressing Goal: Knowledge of secondary prevention will improve (MUST DOCUMENT ALL) Outcome: Progressing Goal: Knowledge of patient specific risk factors will improve [place] N/A or DELETE if not current risk factor) Outcome: Progressing   Problem: Ischemic Stroke/TIA Tissue Perfusion: Goal: Complications of ischemic stroke/TIA will be minimized Outcome: Progressing   Problem: Coping: Goal: Will verbalize positive feelings about self Outcome: Progressing Goal: Will identify appropriate support needs Outcome: Progressing   Problem: Health Behavior/Discharge Planning: Goal: Ability to manage health-related needs will improve Outcome: Progressing Goal: Goals will be collaboratively established with patient/family Outcome: Progressing   Problem: Self-Care: Goal: Ability to participate in self-care as condition permits will improve Outcome: Progressing Goal: Verbalization of feelings and concerns over difficulty with self-care will improve Outcome: Progressing Goal: Ability to communicate needs accurately will improve Outcome: Progressing   Problem: Nutrition: Goal: Risk of aspiration will decrease Outcome: Progressing Goal: Dietary intake will improve Outcome: Progressing   Problem: Education: Goal: Knowledge of General Education information will improve Description: Including pain rating scale, medication(s)/side effects and non-pharmacologic comfort measures Outcome: Progressing   Problem: Health Behavior/Discharge Planning: Goal: Ability to manage health-related needs will improve Outcome: Progressing   Problem: Clinical Measurements: Goal: Ability to maintain clinical measurements within normal limits will improve Outcome: Progressing Goal: Will remain free from infection Outcome: Progressing Goal: Diagnostic test results will improve Outcome: Progressing Goal: Respiratory  complications will improve Outcome: Progressing Goal: Cardiovascular complication will be avoided Outcome: Progressing   Problem: Activity: Goal: Risk for activity intolerance will decrease Outcome: Progressing   Problem: Nutrition: Goal: Adequate nutrition will be maintained Outcome: Progressing   Problem: Pain Managment: Goal: General experience of comfort will improve Outcome: Progressing   Problem: Safety: Goal: Ability to remain free from injury will improve Outcome: Progressing   Problem: Skin Integrity: Goal: Risk for impaired skin integrity will decrease Outcome: Progressing   Problem: Education: Goal: Understanding of CV disease, CV risk reduction, and recovery process will improve Outcome: Progressing   Problem: Activity: Goal: Ability to return to baseline activity level will improve Outcome: Progressing   Problem: Cardiovascular: Goal: Ability to achieve and maintain adequate cardiovascular perfusion will improve Outcome: Progressing Goal: Vascular access site(s) Level 0-1 will be maintained Outcome: Progressing   Problem: Health Behavior/Discharge Planning: Goal: Ability to safely manage health-related needs after discharge will improve Outcome: Progressing   Problem: Coping: Goal: Level of anxiety will decrease Outcome: Not Progressing   Problem: Elimination: Goal: Will not experience complications related to bowel motility Outcome: Not Progressing Goal: Will not experience complications related to urinary retention Outcome: Not Progressing   Problem: Education: Goal: Individualized Educational Video(s) Outcome: Not Applicable

## 2022-02-02 NOTE — Evaluation (Signed)
Physical Therapy Evaluation Patient Details Name: ROSY ESTABROOK MRN: [id number] DOB: 10/18/1945 Today's Date: 02/02/2022  History of Present Illness  76 y.o. female presents to [facility] hospital on 01/29/2022 as a transfer from outside hospital with R side numbness. CT head indicating possible CVA. 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 Pt underwent diagnostic angiogram on 11/2 and angioplasty on 11/3 after recurrent symptoms and CT showing L cerebellar infarcts. PMH includes afib, HTN.  Clinical Impression  Pt presents to PT with deficits in functional mobility, gait, balance, endurance, coordination, sensation, awareness. Pt with significant deficits in sensation on R side along with impaired coordination. Pt requires significant assistance to ambulate at this time due to instability, needing PT assist to prevent multiple losses of balance from resulting in falls. Pt will benefit from aggressive mobilization in an effort to improve mobility quality and restore independence. PT recommends AIR admission as the pt was independent and working prior to this admission, and she demonstrates the potential to return to a modI level with high intensity inpatient PT services.       Recommendations for follow up therapy are one component of a multi-disciplinary discharge planning process, led by the attending physician.  Recommendations may be updated based on patient status, additional functional criteria and insurance authorization.  Follow Up Recommendations Acute inpatient rehab (3hours/day)      Assistance Recommended at Discharge Intermittent Supervision/Assistance  Patient can return home with the following  A lot of help with walking and/or transfers;A lot of help with bathing/dressing/bathroom;Assistance with cooking/housework;Assist for transportation;Help with stairs or ramp for entrance    Equipment Recommendations  (TBD pending progress)  Recommendations for Other Services  Rehab  consult    Functional Status Assessment Patient has had a recent decline in their functional status and demonstrates the ability to make significant improvements in function in a reasonable and predictable amount of time.     Precautions / Restrictions Precautions Precautions: Fall Restrictions Weight Bearing Restrictions: No      Mobility  Bed Mobility Overal bed mobility: Needs Assistance Bed Mobility: Supine to Sit     Supine to sit: Supervision, HOB elevated          Transfers Overall transfer level: Needs assistance Equipment used: 2 person hand held assist Transfers: Sit to/from Stand Sit to Stand: Min assist, +2 physical assistance                Ambulation/Gait Ambulation/Gait assistance: Mod assist, +2 physical assistance Gait Distance (Feet): 40 Feet Assistive device: 2 person hand held assist Gait Pattern/deviations: Step-through pattern, Wide base of support Gait velocity: reduced Gait velocity interpretation: <1.31 ft/sec, indicative of household ambulator   General Gait Details: significant lateral sway requiring physical assistance to maintain balance. Impaired coordination of RLE during swing phase  Stairs            Wheelchair Mobility    Modified Rankin (Stroke Patients Only)       Balance Overall balance assessment: Needs assistance Sitting-balance support: No upper extremity supported, Feet supported Sitting balance-Leahy Scale: Fair     Standing balance support: Single extremity supported, Bilateral upper extremity supported, Reliant on assistive device for balance Standing balance-Leahy Scale: Poor                               Pertinent Vitals/Pain Pain Assessment Pain Assessment: No/denies pain    Home Living Family/patient expects to be discharged  to:: Private residence Living Arrangements: Spouse/significant other Available Help at Discharge: Friend(s);Available 24 hours/day Type of Home: House Home  Access: Stairs to enter Entrance Stairs-Rails: Can reach both Entrance Stairs-Number of Steps: 2 to 3 Alternate Level Stairs-Number of Steps: 10+1+1 Home Layout: Two level Home Equipment: Conservation officer, nature (2 wheels);Grab bars - tub/shower Additional Comments: pt manages meds and finances, supervises significant other when he drives    Prior Function Prior Level of Function : Independent/Modified Independent             Mobility Comments: Community ambulator without AD; drives. Still works 5-7 hours a day from home ADLs Comments: Independent     Hand Dominance   Dominant Hand: Right    Extremity/Trunk Assessment   Upper Extremity Assessment Upper Extremity Assessment: Defer to OT evaluation    Lower Extremity Assessment Lower Extremity Assessment: RLE deficits/detail RLE Deficits / Details: ROM and strength WFL RLE Sensation: decreased light touch (no sensation in R foot) RLE Coordination: decreased gross motor    Cervical / Trunk Assessment Cervical / Trunk Assessment: Normal  Communication   Communication: No difficulties  Cognition Arousal/Alertness: Awake/alert Behavior During Therapy: WFL for tasks assessed/performed Overall Cognitive Status: Impaired/Different from baseline Area of Impairment: Awareness, Safety/judgement                         Safety/Judgement: Decreased awareness of deficits Awareness: Emergent            General Comments General comments (skin integrity, edema, etc.): VSS on RA    Exercises     Assessment/Plan    PT Assessment Patient needs continued PT services  PT Problem List Decreased strength;Decreased activity tolerance;Decreased balance;Decreased mobility;Decreased cognition;Decreased coordination;Decreased knowledge of use of DME;Decreased safety awareness;Decreased knowledge of precautions;Impaired sensation       PT Treatment Interventions DME instruction;Gait training;Stair training;Functional mobility  training;Therapeutic activities;Therapeutic exercise;Balance training;Neuromuscular re-education;Patient/family education;Cognitive remediation    PT Goals (Current goals can be found in the Care Plan section)  Acute Rehab PT Goals Patient Stated Goal: to return to independence, get home to put up christmas trees PT Goal Formulation: With patient Time For Goal Achievement: 02/16/22 Potential to Achieve Goals: Good    Frequency Min 4X/week     Co-evaluation PT/OT/SLP Co-Evaluation/Treatment: Yes Reason for Co-Treatment: Complexity of the patient's impairments (multi-system involvement);Necessary to address cognition/behavior during functional activity;For patient/therapist safety;To address functional/ADL transfers PT goals addressed during session: Mobility/safety with mobility;Balance;Strengthening/ROM         AM-PAC PT "6 Clicks" Mobility  Outcome Measure Help needed turning from your back to your side while in a flat bed without using bedrails?: A Little Help needed moving from lying on your back to sitting on the side of a flat bed without using bedrails?: A Little Help needed moving to and from a bed to a chair (including a wheelchair)?: A Lot Help needed standing up from a chair using your arms (e.g., wheelchair or bedside chair)?: A Little Help needed to walk in hospital room?: A Lot Help needed climbing 3-5 steps with a railing? : Total 6 Click Score: 14    End of Session   Activity Tolerance: Patient tolerated treatment well Patient left: in chair;with call bell/phone within reach;with chair alarm set Nurse Communication: Mobility status PT Visit Diagnosis: Other symptoms and signs involving the nervous system (R29.898);Other abnormalities of gait and mobility (R26.89)    Time: [id number] PT Time Calculation (min) (ACUTE ONLY): 22 min   Charges:  PT Evaluation $PT Eval Low Complexity: 1 Low          Zenaida Niece, PT, DPT Acute Rehabilitation Office  [place] 02/02/2022, 12:38 PM

## 2022-02-02 NOTE — Evaluation (Signed)
Occupational Therapy Evaluation Patient Details Name: Anita Fletcher MRN: [id number] DOB: [birth year] Today's Date: 02/02/2022   History of Present Illness 76 y.o. female presents to [facility] hospital on 01/29/2022 as a transfer from outside hospital with R side numbness. CT head indicating possible CVA. 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 Pt underwent diagnostic angiogram on 11/2 and angioplasty on 11/3 after recurrent symptoms and CT showing L cerebellar infarcts. PMH includes afib, HTN.   Clinical Impression   This 76 yo female admitted and underwent above presents to acute OT with PLOF of being totally independent with all basic ADLs, IADLs, driving, and working full time from home. Currently she is setup/S-total A for basic ADLs and S-Mod A +2 for mobility. She will continue to benefit from acute OT with follow up on AIR to get back to working from home and being able to put up her Christmas trees.      Recommendations for follow up therapy are one component of a multi-disciplinary discharge planning process, led by the attending physician.  Recommendations may be updated based on patient status, additional functional criteria and insurance authorization.   Follow Up Recommendations  Acute inpatient rehab (3hours/day)    Assistance Recommended at Discharge Frequent or constant Supervision/Assistance  Patient can return home with the following A lot of help with walking and/or transfers;A lot of help with bathing/dressing/bathroom;Assistance with cooking/housework;Assistance with feeding;Help with stairs or ramp for entrance;Assist for transportation    Functional Status Assessment  Patient has had a recent decline in their functional status and demonstrates the ability to make significant improvements in function in a reasonable and predictable amount of time.  Equipment Recommendations  Other (comment) (TBD next venue)       Precautions / Restrictions  Precautions Precautions: Fall Restrictions Weight Bearing Restrictions: No      Mobility Bed Mobility Overal bed mobility: Needs Assistance Bed Mobility: Supine to Sit     Supine to sit: Supervision, HOB elevated          Transfers Overall transfer level: Needs assistance Equipment used: 2 person hand held assist Transfers: Sit to/from Stand Sit to Stand: Min assist, +2 physical assistance                  Balance Overall balance assessment: Needs assistance Sitting-balance support: No upper extremity supported, Feet supported Sitting balance-Leahy Scale: Fair     Standing balance support: Single extremity supported, Bilateral upper extremity supported, Reliant on assistive device for balance Standing balance-Leahy Scale: Poor                             ADL either performed or assessed with clinical judgement   ADL Overall ADL's : Needs assistance/impaired Eating/Feeding: Set up;Sitting Eating/Feeding Details (indicate cue type and reason): in recliner Grooming: Minimal assistance;Sitting Grooming Details (indicate cue type and reason): in recliner Upper Body Bathing: Minimal assistance;Sitting Upper Body Bathing Details (indicate cue type and reason): in recliner Lower Body Bathing: Moderate assistance Lower Body Bathing Details (indicate cue type and reason): min A +2 sit<>stand Upper Body Dressing : Moderate assistance;Sitting Upper Body Dressing Details (indicate cue type and reason): recliner Lower Body Dressing: Moderate assistance Lower Body Dressing Details (indicate cue type and reason): min A +2 sit<>stand Toilet Transfer: Moderate assistance;+2 for physical assistance;Ambulation Toilet Transfer Details (indicate cue type and reason): Bil HHA; simulated bed>door>back to recliner Toileting- Clothing Manipulation and Hygiene: Total assistance Toileting -  Clothing Manipulation Details (indicate cue type and reason): min A +2 sit<>stand              Vision Baseline Vision/History: 1 Wears glasses Ability to See in Adequate Light: 0 Adequate Patient Visual Report: No change from baseline Vision Assessment?: Yes Eye Alignment: Within Functional Limits Ocular Range of Motion: Within Functional Limits Alignment/Gaze Preference: Within Defined Limits Tracking/Visual Pursuits: Able to track stimulus in all quads without difficulty Saccades: Within functional limits Convergence: Within functional limits Visual Fields:  (increased time on right to identify object than on right --but not sure is a true visual field cut)            Pertinent Vitals/Pain Pain Assessment Pain Assessment: No/denies pain     Hand Dominance Right   Extremity/Trunk Assessment Upper Extremity Assessment Upper Extremity Assessment: RUE deficits/detail RUE Deficits / Details: Decreased functional use compared to LUE, but could donn socks sitting EOB (did tend to lean); could not feel light touch throughout whole arm, did feel pain in upper arm when pinched-a little bit at forearm--not at all in hand RUE Sensation: decreased light touch RUE Coordination: decreased fine motor;decreased gross motor           Communication Communication Communication: No difficulties   Cognition Arousal/Alertness: Awake/alert Behavior During Therapy: WFL for tasks assessed/performed Overall Cognitive Status: Impaired/Different from baseline Area of Impairment: Awareness, Safety/judgement                         Safety/Judgement: Decreased awareness of deficits Awareness: Emergent         General Comments  VSS on RA            Home Living Family/patient expects to be discharged to:: Private residence Living Arrangements: Spouse/significant other Available Help at Discharge: Friend(s);Available 24 hours/day Type of Home: House Home Access: Stairs to enter [employer] of Steps: 2 to 3 Entrance Stairs-Rails: Can reach  both Home Layout: Two level Alternate Level Stairs-Number of Steps: 10+1+1 Alternate Level Stairs-Rails: Left Bathroom Shower/Tub: Teacher, early years/pre: Standard Bathroom Accessibility: Yes How Accessible: Accessible via walker Home Equipment: Rolling Walker (2 wheels);Grab bars - tub/shower   Additional Comments: pt manages meds and finances, supervises significant other when he drives      Prior Functioning/Environment Prior Level of Function : Independent/Modified Independent             Mobility Comments: Community ambulator without AD; drives. 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, applications ADLs Comments: Independent        OT Problem List: Decreased strength;Decreased range of motion;Impaired balance (sitting and/or standing);Impaired vision/perception;Impaired UE functional use      OT Treatment/Interventions: Self-care/ADL training;DME and/or AE instruction;Patient/family education;Balance training;Therapeutic exercise;Therapeutic activities    OT Goals(Current goals can be found in the care plan section) Acute Rehab OT Goals Patient Stated Goal: to get better and return home OT Goal Formulation: With patient Time For Goal Achievement: 02/16/22 Potential to Achieve Goals: Good  OT Frequency: Min 2X/week    Co-evaluation PT/OT/SLP Co-Evaluation/Treatment: Yes Reason for Co-Treatment: Complexity of the patient's impairments (multi-system involvement);Necessary to address cognition/behavior during functional activity;For patient/therapist safety;To address functional/ADL transfers PT goals addressed during session: Mobility/safety with mobility;Balance;Strengthening/ROM OT goals addressed during session: ADL's and self-care;Strengthening/ROM      AM-PAC OT "6 Clicks" Daily Activity     Outcome Measure Help from another person eating meals?: A Little Help from another  person taking care of personal grooming?: A Little Help from  another person toileting, which includes using toliet, bedpan, or urinal?: A Lot Help from another person bathing (including washing, rinsing, drying)?: A Lot Help from another person to put on and taking off regular upper body clothing?: A Little Help from another person to put on and taking off regular lower body clothing?: A Lot 6 Click Score: 15   End of Session Equipment Utilized During Treatment: Gait belt Nurse Communication: Mobility status  Activity Tolerance: Patient tolerated treatment well Patient left: in chair;with call bell/phone within reach;with chair alarm set  OT Visit Diagnosis: Unsteadiness on feet (R26.81);Other abnormalities of gait and mobility (R26.89);Muscle weakness (generalized) (M62.81);Hemiplegia and hemiparesis Hemiplegia - Right/Left: Right Hemiplegia - dominant/non-dominant: Dominant Hemiplegia - caused by: Cerebral infarction                Time: 4742-5956 OT Time Calculation (min): 22 min Charges:  OT General Charges $OT Visit: 1 Visit OT Evaluation $OT Eval Moderate Complexity: [place], OTR/L [facility] [phone] Office [phone] ]  Almon Register 02/02/2022, 3:14 PM

## 2022-02-02 NOTE — Progress Notes (Signed)
PROGRESS NOTE    Anita Fletcher  LFY:[id number] DOB: 03/24/1946 DOA: 01/27/2022 PCP: Patient, No Pcp Per   Brief Narrative:  76 year old female with a history of paroxysmal atrial fibrillation on anticoagulation, hypertension, prior history of TIA, presents to the hospital with right arm, right face, right leg numbness.  CT head indicated possible CVA.  She is being admitted to  [facility] for further evaluation by neurology and MRI brain. Anticipating transfer today and Neurology team aware.  Assessment & Plan:   Principal Problem:   Acute CVA (cerebrovascular accident) [facility]) Active Problems:   Paroxysmal atrial fibrillation (HCC)   HTN (hypertension)   Intracranial atherosclerosis  Assessment and Plan:  Acute CVA Recurrent episode 11/3 -Confirmed on MRI that required sedation today 01-30-22 -Repeat CT head showing interval development of additional small acute left cerebellar infarcts without hemorrhage -Neurology/vascular following, appreciate insight recommendations -Likely secondary to below -Right sided numbness improving but not yet resolved -PT OT following, recommending inpatient rehab -Disposition pending above   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  -Basilar artery stent placement 01/31/2022 - tolerated well -Continue Eliquis and Brilinta, aspirin discontinued per discussion with neurology  Acute respiratory insufficiency with hypoxia post operatively -Initially on bipap - weaned to [place] -Continue to wean back to room air(her baseline)  Hypertension -Resume home medications in the next 24 hours   Paroxysmal atrial fibrillation -Currently in sinus rhythm -Reported compliance with Eliquis prior to admission -Previously on aspirin Plavix statin  -Transition to Eliquis/Brilinta as above   Profound Anxiety/Claustrophobia -Severe claustrophobia at baseline, has difficulty even riding in elevators -Xanax ordered as needed for anxiety -MRI  completed with sedation, appreciate radiology and anesthesia's assistance     DVT prophylaxis: enoxaparin   Code Status: Full code Family Communication: Discussed with patient Disposition Plan: Status is: Inpatient Remains inpatient appropriate because: Continued work-up in the setting of acute stroke   Consultants:  Neurology   Antimicrobials:   None   Subjective: No acute issues or events overnight denies nausea vomiting diarrhea constipation headache fevers chills or chest pain  Objective: Vitals:   02/02/22 0500 02/02/22 0600 02/02/22 0648 02/02/22 0700  BP: (!) 171/72 (!) 179/97 (!) 187/100 (!) 150/86  Pulse: (!) 59 (!) 59  65  Resp: 20 20  19   Temp:      TempSrc:      SpO2: 96% 98%  98%  Weight:      Height:        Intake/Output Summary (Last 24 hours) at 02/02/2022 0812 Last data filed at 02/02/2022 0200 Gross per 24 hour  Intake 405.71 ml  Output 780 ml  Net -374.29 ml    Filed Weights   01/27/22 1300 01/30/22 1121  Weight: 76.2 kg 74.8 kg    Examination:  General:  Pleasantly resting in bed, No acute distress. HEENT:  Normocephalic atraumatic.  Sclerae nonicteric, noninjected.  Extraocular movements intact bilaterally. Neck:  Without mass or deformity.  Trachea is midline. Lungs:  Clear to auscultate bilaterally without rhonchi, wheeze, or rales. Heart:  Regular rate and rhythm.  Without murmurs, rubs, or gallops. Abdomen:  Soft, nontender, nondistended.  Without guarding or rebound. Extremities: Without cyanosis, clubbing, edema, or obvious deformity. Vascular:  Dorsalis pedis and posterior tibial pulses palpable bilaterally. Skin:  Warm and dry, no erythema, no ulcerations.   Data Reviewed: I have personally reviewed following labs and imaging studies  CBC: Recent Labs  Lab 01/27/22 1352 01/31/22 0320 01/31/22 1331 01/31/22 1549 02/01/22 0011  WBC 7.1 7.9  --   --  6.4  NEUTROABS 4.4  --   --   --   --   HGB 14.9 14.0 13.6 13.6 13.1  HCT  44.4 40.8 40.0 40.0 39.4  MCV 92.7 92.1  --   --  92.3  PLT 253 214  --   --  205    Basic Metabolic Panel: Recent Labs  Lab 01/27/22 1352 01/31/22 0320 01/31/22 1331 01/31/22 1549 02/01/22 0011  NA 141 141 143 142 141  K 3.6 3.0* 3.5 3.5 3.4*  CL 107 112*  --   --  110  CO2 26 22  --   --  21*  GLUCOSE 82 107*  --   --  118*  BUN 11 12  --   --  10  CREATININE 0.76 0.79  --   --  0.64  CALCIUM 9.0 8.3*  --   --  8.7*    GFR: Estimated Creatinine Clearance: 60.5 mL/min (by C-G formula based on SCr of 0.64 mg/dL). Liver Function Tests: Recent Labs  Lab 01/27/22 1352  AST 14*  ALT 15  ALKPHOS 56  BILITOT 0.5  PROT 6.8  ALBUMIN 3.8    No results for input(s): "LIPASE", "AMYLASE" in the last 168 hours. No results for input(s): "AMMONIA" in the last 168 hours. Coagulation Profile: Recent Labs  Lab 01/30/22 0354  INR 1.1    Cardiac Enzymes: No results for input(s): "CKTOTAL", "CKMB", "CKMBINDEX", "TROPONINI" in the last 168 hours. BNP (last 3 results) No results for input(s): "PROBNP" in the last 8760 hours. HbA1C: No results for input(s): "HGBA1C" in the last 72 hours.  CBG: Recent Labs  Lab 01/27/22 1354 01/31/22 1827  GLUCAP 77 136*    Lipid Profile: No results for input(s): "CHOL", "HDL", "LDLCALC", "TRIG", "CHOLHDL", "LDLDIRECT" in the last 72 hours.  Thyroid Function Tests: No results for input(s): "TSH", "T4TOTAL", "FREET4", "T3FREE", "THYROIDAB" in the last 72 hours. Anemia Panel: No results for input(s): "VITAMINB12", "FOLATE", "FERRITIN", "TIBC", "IRON", "RETICCTPCT" in the last 72 hours. Sepsis Labs: No results for input(s): "PROCALCITON", "LATICACIDVEN" in the last 168 hours.  No results found for this or any previous visit (from the past 240 hour(s)).       Radiology Studies: CT HEAD WO CONTRAST ( )  Result Date: 01/31/2022 CLINICAL DATA:  Follow-up examination for stroke. EXAM: CT HEAD WITHOUT CONTRAST TECHNIQUE: Contiguous  axial images were obtained from the base of the skull through the vertex without intravenous contrast. RADIATION DOSE REDUCTION: This exam was performed according to the departmental dose-optimization program which includes automated exposure control, adjustment of the mA and/or kV according to patient size and/or use of iterative reconstruction technique. COMPARISON:  Prior CT from 01/27/2022 and MRI from 01/30/2022. FINDINGS: Brain: Sequelae of interval stenting at the basilar artery. 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 No associated hemorrhage or mass effect. Now seen are 8 a few additional small vaulting left cerebellar infarcts (series 3, images 8, 7). No other evidence for acute large vessel territory infarct. No acute intracranial hemorrhage. No mass lesion or midline shift. No hydrocephalus or extra-axial fluid collection. Underlying chronic microvascular ischemic disease noted. 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 Vascular: Interval stent placement at the basilar artery. Calcified atherosclerosis about the skull base. No abnormal hyperdense vessel. Skull: Scalp soft tissues and calvarium demonstrate no new finding. Sinuses/Orbits: Globes orbital soft tissues demonstrate no acute finding. Paranasal sinuses are largely clear. Small chronic right  mastoid effusion, similar to prior. Other: None. IMPRESSION: 1. Sequelae of interval stenting at the basilar artery. 2. 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 No associated hemorrhage or mass effect. 3. Interval development of a few additional small acute left cerebellar infarcts. No acute intracranial hemorrhage. 4. 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 Electronically Signed   By: Jeannine Boga M.D.   On: 01/31/2022 21:00   DG CHEST PORT 1 VIEW  Result Date: 01/31/2022 CLINICAL DATA:  CVA.  Numbness. EXAM: PORTABLE  CHEST 1 VIEW COMPARISON:  08/24/2021 FINDINGS: Stable enlarged cardiac silhouette and large hiatal hernia. Tortuous and calcified thoracic aorta. Interval patchy density at the left lung base and minimal patchy density at the right lung base. Interval small amount of linear atelectasis in the right mid lung zone. Interval minimal right pleural effusion and possible small left pleural effusion. Diffuse osteopenia. IMPRESSION: 1. Interval patchy atelectasis or pneumonia at the left lung base and minimal patchy atelectasis or pneumonia at the right lung base. 2. Interval minimal right pleural effusion and possible small left pleural effusion. 3. Stable cardiomegaly and large hiatal hernia. Electronically Signed   By: Claudie Revering M.D.   On: 01/31/2022 13:44        Scheduled Meds:   stroke: early stages of recovery book   Does not apply Once   amLODipine  5 mg Oral Daily   aspirin  81 mg Oral Daily   atorvastatin  40 mg Oral Daily   Chlorhexidine Gluconate Cloth  6 each Topical Daily   losartan  50 mg Oral Daily   ticagrelor  90 mg Oral BID     LOS: 6 days    Time spent: 35 minutes    Little Ishikawa, DO [facility]  If 7PM-7AM, please contact night-coverage [email or web address] 02/02/2022, 8:12 AM

## 2022-02-02 NOTE — Plan of Care (Signed)
  Problem: Education: Goal: Knowledge of disease or condition will improve Outcome: Progressing Goal: Knowledge of secondary prevention will improve (MUST DOCUMENT ALL) Outcome: Progressing   Problem: Ischemic Stroke/TIA Tissue Perfusion: Goal: Complications of ischemic stroke/TIA will be minimized Outcome: Progressing   Problem: Coping: Goal: Will verbalize positive feelings about self Outcome: Progressing Goal: Will identify appropriate support needs Outcome: Progressing   Problem: Health Behavior/Discharge Planning: Goal: Ability to manage health-related needs will improve Outcome: Progressing Goal: Goals will be collaboratively established with patient/family Outcome: Progressing   Problem: Self-Care: Goal: Ability to participate in self-care as condition permits will improve Outcome: Progressing Goal: Verbalization of feelings and concerns over difficulty with self-care will improve Outcome: Progressing Goal: Ability to communicate needs accurately will improve Outcome: Progressing   Problem: Nutrition: Goal: Dietary intake will improve Outcome: Progressing   Problem: Education: Goal: Knowledge of General Education information will improve Description: Including pain rating scale, medication(s)/side effects and non-pharmacologic comfort measures Outcome: Progressing   Problem: Health Behavior/Discharge Planning: Goal: Ability to manage health-related needs will improve Outcome: Progressing   Problem: Clinical Measurements: Goal: Ability to maintain clinical measurements within normal limits will improve Outcome: Progressing Goal: Will remain free from infection Outcome: Progressing Goal: Diagnostic test results will improve Outcome: Progressing Goal: Cardiovascular complication will be avoided Outcome: Progressing   Problem: Activity: Goal: Risk for activity intolerance will decrease Outcome: Progressing   Problem: Nutrition: Goal: Adequate nutrition  will be maintained Outcome: Progressing   Problem: Coping: Goal: Level of anxiety will decrease Outcome: Progressing   Problem: Elimination: Goal: Will not experience complications related to urinary retention Outcome: Progressing   Problem: Pain Managment: Goal: General experience of comfort will improve Outcome: Progressing   Problem: Safety: Goal: Ability to remain free from injury will improve Outcome: Progressing   Problem: Skin Integrity: Goal: Risk for impaired skin integrity will decrease Outcome: Progressing   Problem: Education: Goal: Understanding of CV disease, CV risk reduction, and recovery process will improve Outcome: Progressing   Problem: Activity: Goal: Ability to return to baseline activity level will improve Outcome: Progressing   Problem: Cardiovascular: Goal: Ability to achieve and maintain adequate cardiovascular perfusion will improve Outcome: Progressing Goal: Vascular access site(s) Level 0-1 will be maintained Outcome: Progressing   Problem: Health Behavior/Discharge Planning: Goal: Ability to safely manage health-related needs after discharge will improve Outcome: Progressing   Problem: Nutrition: Goal: Risk of aspiration will decrease Outcome: Not Progressing   Problem: Clinical Measurements: Goal: Respiratory complications will improve Outcome: Not Progressing   Problem: Elimination: Goal: Will not experience complications related to bowel motility Outcome: Not Progressing

## 2022-02-02 NOTE — Progress Notes (Signed)
ANTICOAGULATION CONSULT NOTE - Initial Consult  Pharmacy Consult for Eliquis  Indication: atrial fibrillation  Allergies  Allergen Reactions   Codeine     Patient Measurements: Height: 5' 3.5" (161.3 cm) Weight: 74.8 kg (165 lb) IBW/kg (Calculated) : 53.55  Vital Signs: Temp: 97.8 F (36.6 C) (11/05 0800) Temp Source: Oral (11/05 0800) BP: 124/89 (11/05 0800) Pulse Rate: 67 (11/05 0800)  Labs: Recent Labs    01/31/22 0320 01/31/22 1331 01/31/22 1549 02/01/22 0011 02/01/22 1010  HGB 14.0 13.6 13.6 13.1  --   HCT 40.8 40.0 40.0 39.4  --   PLT 214  --   --  205  --   APTT  --   --   --  32  --   HEPARINUNFRC  --   --   --  0.12* 0.11*  CREATININE 0.79  --   --  0.64  --     Estimated Creatinine Clearance: 60.5 mL/min (by C-G formula based on SCr of 0.64 mg/dL).   Medical History: Past Medical History:  Diagnosis Date   Acute CVA (cerebrovascular accident) ([place]) 01/27/2022   HTN (hypertension) 01/27/2022   TIA (transient ischemic attack)     Medications:  Medications Prior to Admission  Medication Sig Dispense Refill Last Dose   apixaban (ELIQUIS) 5 MG TABS tablet Take 1 tablet (5 mg total) by mouth 2 (two) times daily. 60 tablet 11 01/27/2022 at 0900   losartan (COZAAR) 50 MG tablet Take 1 tablet (50 mg total) by mouth daily. 90 tablet 3 01/27/2022   metoprolol tartrate (LOPRESSOR) 25 MG tablet Take 1 tablet (25 mg total) by mouth 2 (two) times daily. 180 tablet 3 01/27/2022 at 0900    Assessment: Patient admitted with CC of numbness to right face, hand, and lower extremity. Patient underwent basilar artery stenting and subsequently developed a small cerebellar stroke after. Patient with PMH of Afib and was on Eliquis PTA. Last dose 01/27/2022, was briefly on heparin after stenting but was stopped on 11/4. Neuro IR recommending 1 day overlap of Brilinta/Eliquis/ASA and then down to just Brilinta/Eliquis. Patient not currently on ASA, was d/c by neuro this  morning.   Patient's hemoglobin and renal function are stable. Does not qualify for a dose reduction.   Goal of Therapy:  Monitor platelets by anticoagulation protocol: Yes   Plan:  Start Eliquis 5mg  BID.  Monitor H & H for signs of bleeding.   Ventura Sellers 02/02/2022,9:08 AM

## 2022-02-02 NOTE — Progress Notes (Addendum)
STROKE TEAM PROGRESS NOTE   INTERVAL HISTORY RN at the bedside  She is awake and alert, oriented x 4. Denies nausea and double vision. Still ataxic on right hand but improved. Facial droop right side, sensation decreased on right  Neurological exam is unchanged,  PT/OT BP and vitals are stable  Will start Eliquis today and stop ASA.   Transfer out of the unit   Vitals:   02/02/22 0600 02/02/22 0648 02/02/22 0700 02/02/22 0800  BP: (!) 179/97 (!) 187/100 (!) 150/86 124/89  Pulse: (!) 59  65 67  Resp: 20  19 20   Temp:    97.8 F (36.6 C)  TempSrc:    Oral  SpO2: 98%  98% 98%  Weight:      Height:       CBC:  Recent Labs  Lab 01/27/22 1352 01/31/22 0320 01/31/22 1331 01/31/22 1549 02/01/22 0011  WBC 7.1 7.9  --   --  6.4  NEUTROABS 4.4  --   --   --   --   HGB 14.9 14.0   < > 13.6 13.1  HCT 44.4 40.8   < > 40.0 39.4  MCV 92.7 92.1  --   --  92.3  PLT 253 214  --   --  205   < > = values in this interval not displayed.    Basic Metabolic Panel:  Recent Labs  Lab 01/31/22 0320 01/31/22 1331 01/31/22 1549 02/01/22 0011  NA 141   < > 142 141  K 3.0*   < > 3.5 3.4*  CL 112*  --   --  110  CO2 22  --   --  21*  GLUCOSE 107*  --   --  118*  BUN 12  --   --  10  CREATININE 0.79  --   --  0.64  CALCIUM 8.3*  --   --  8.7*   < > = values in this interval not displayed.    Lipid Panel:  Recent Labs  Lab 01/28/22 0319  CHOL 226*  TRIG 253*  HDL 35*  CHOLHDL 6.5  VLDL 51*  LDLCALC 140*    HgbA1c:  Recent Labs  Lab 01/28/22 0319  HGBA1C 5.5     IMAGING past 24 hours No results found.  PHYSICAL EXAM  Temp:  [97.8 F (36.6 C)-99.9 F (37.7 C)] 97.8 F (36.6 C) (11/05 0800) Pulse Rate:  [58-75] 67 (11/05 0800) Resp:  [13-25] 20 (11/05 0800) BP: (117-187)/(69-100) 124/89 (11/05 0800) SpO2:  [90 %-98 %] 98 % (11/05 0800)  General - Well nourished, well developed, in no apparent distress. Cardiovascular - Regular rhythm and rate, not in  afib.  Mental Status -  Level of arousal and orientation to time, place, and person were intact. Language including expression, naming, repetition, comprehension was assessed and found intact. Fund of Knowledge was assessed and was intact.  Cranial Nerves II - XII - II - Visual field intact OU. III, IV, VI - Extraocular movements intact. V - Facial sensation decreased on the right VII - right facial VIII - Hearing & vestibular intact bilaterally. X - Palate elevates symmetrically. XI - Chin turning & shoulder shrug intact bilaterally. XII - Tongue protrusion intact.  Motor Strength - Left is 5/5, right arm and leg 4/5 with slight drift.  Bulk was normal and fasciculations were absent.   Motor Tone - Muscle tone was assessed at the neck and appendages and was normal.  Reflexes -  The patient's reflexes were symmetrical in all extremities and she had no pathological reflexes.  Sensory - Light touch, temperature/pinprick were assessed and were decreased on the right UE and LE.   Coordination - The patient had normal movements in the left hand and foot with no ataxia or dysmetria, but dysmetria on the R FTN and ataxia on the R HTS.  Tremor was absent.  Gait and Station - deferred.   ASSESSMENT/PLAN [name] is a 76 y.o. female presenting with right sided stuttering sensory deficits starting 3 days ago that then became persistent; the 3rd episode's symptoms have not remitted. She was found to have subtle right hemiparesis, and sensory ataxia causing gait difficulties when arrived at [facility].her exam is limited @ this time , however when she arrived @ [facility] her exam reveale right hemiataxia, subtle righ sided weakness and right sided hypoesthesia her symptoms were concerning for subcortical left hemispheric ischemic infarction obscured by severe small vessel disease on head CT Scan. MRI Brain w/o contrast (today) reveals small infarct in left thalamus/posterior limb of the  internal capsule w/o hemorrhage or mass effect. Small remote infarcts and background w/ advanced chronic small vessel ischemic changes. She is now s/p cerebral angiogram showing a extreme tortuosity of major neck arteries ,Severe tortuosity of the proximal basilar artery resulting in 85% stenosis.  Ulcerated plaque in the left carotid bulb.  No significant stenosis.  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 Large vessel disease from BA stenosis CT no acute abnormality.   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   MRI showed left lateral thalamic infarct.   Cerebral angiogram today showed extreme tortuosity of major neck arteries, severe tortuosity of the proximal basilar artery resulting in 85% stenosis, Ulcerated plaque in the left carotid bulb.  No significant stenosis.  Bilateral fetal PCAs with severe atherosclerotic disease, greater on the left side where there is moderate stenosis at the P2 segment.  EF 70 to 75%.   LDL 140, TG 253 A1c 5.5.   VTE prophylaxis - SCDs Eliquis restarted today. Continue Brilinta. ASA discontinued  PT/OT pending  Deposition pending  Basilar artery stenosis CTA head neck showed proximal basilar artery severe stenosis.   Cerebral angiogram today showed extreme tortuosity of major neck arteries, severe tortuosity of the proximal basilar artery resulting in 85% stenosis. Pt agreed with BA stenting today with Dr. Sherlon Handing  Afib On eliqiis at home Current on hold for BA stent Restart Eliquis today   Hypertension Stable cleviprex drip off  On Norvasc 5mg  and coreg 50mg   Long term BP goal normotensive  Hyperlipidemia Home meds:  none  LDL 140, goal < 70 Now on lipitor 40 Continue statin at discharge  Other Stroke Risk Factors Advanced  age Hx of TIA  Other Active Problems Hypokalemia K 3.4 replace. Check in am   Hospital day # 6  DNP, ACNPC-AG   I have personally obtained history,examined this patient, reviewed notes, independently viewed imaging studies, participated in medical decision making and plan of care.ROS completed by me personally and pertinent positives fully documented  I have made any additions or clarifications directly to the above note. Agree with note above.  Patient appears to be improving neurologically.  Continue mobilization out of bed and physical  Occupational Therapy consults.  Transfer to neurology floor bed.  Start Eliquis and discontinue aspirin.  Physical Occupational Therapy consults.  Long discussion with patient and family at the bedside and answered questions.This patient is critically ill and at significant risk of neurological worsening, death and care requires constant monitoring of vital signs, hemodynamics,respiratory and cardiac monitoring, extensive review of multiple databases, frequent neurological assessment, discussion with family, other specialists and medical decision making of high complexity.I have made any additions or clarifications directly to the above note.This critical care time does not reflect procedure time, or teaching time or supervisory time of PA/NP/Med Resident etc but could involve care discussion time.  I spent 30 minutes of neurocritical care time  in the care of  this patient.      Delia Heady, MD Medical Director [facility] Pager: [phone] 02/02/2022 3:11 PM    To contact Stroke Continuity provider, please refer to [email or web address]. After hours, contact General Neurology

## 2022-02-03 ENCOUNTER — Telehealth (HOSPITAL_COMMUNITY): Payer: Self-pay

## 2022-02-03 ENCOUNTER — Other Ambulatory Visit (HOSPITAL_COMMUNITY): Payer: Self-pay

## 2022-02-03 ENCOUNTER — Encounter (HOSPITAL_COMMUNITY): Payer: Self-pay | Admitting: Radiology

## 2022-02-03 DIAGNOSIS — I639 Cerebral infarction, unspecified: Secondary | ICD-10-CM | POA: Diagnosis not present

## 2022-02-03 LAB — CBC
HCT: 40.9 % (ref 36.0–46.0)
Hemoglobin: 13.9 g/dL (ref 12.0–15.0)
MCH: 31.3 pg (ref 26.0–34.0)
MCHC: 34 g/dL (ref 30.0–36.0)
MCV: 92.1 fL (ref 80.0–100.0)
Platelets: 236 10*3/uL (ref 150–400)
RBC: 4.44 MIL/uL (ref 3.87–5.11)
RDW: 12.9 % (ref 11.5–15.5)
WBC: 7.6 10*3/uL (ref 4.0–10.5)
nRBC: 0 % (ref 0.0–0.2)

## 2022-02-03 LAB — BASIC METABOLIC PANEL
Anion gap: 9 (ref 5–15)
BUN: 14 mg/dL (ref 8–23)
CO2: 23 mmol/L (ref 22–32)
Calcium: 8.7 mg/dL — ABNORMAL LOW (ref 8.9–10.3)
Chloride: 109 mmol/L (ref 98–111)
Creatinine, Ser: 0.79 mg/dL (ref 0.44–1.00)
GFR, Estimated: >60 mL/min (ref >60)
Glucose, Bld: 96 mg/dL (ref 70–99)
Potassium: 3.3 mmol/L — ABNORMAL LOW (ref 3.5–5.1)
Sodium: 141 mmol/L (ref 135–145)

## 2022-02-03 MED ORDER — SENNOSIDES-DOCUSATE SODIUM 8.6-50 MG PO TABS
1.0000 | ORAL_TABLET | Freq: Two times a day (BID) | ORAL | Status: DC
  Administered 2022-02-03 – 2022-02-04 (×3): 1 via ORAL
  Filled 2022-02-03 (×3): qty 1

## 2022-02-03 MED ORDER — POTASSIUM CHLORIDE CRYS ER 20 MEQ PO TBCR
40.0000 meq | EXTENDED_RELEASE_TABLET | ORAL | Status: AC
  Administered 2022-02-03 (×2): 40 meq via ORAL
  Filled 2022-02-03 (×2): qty 2

## 2022-02-03 MED ORDER — ORAL CARE MOUTH RINSE: 15.0000 mL | OROMUCOSAL | Status: DC | PRN

## 2022-02-03 NOTE — Progress Notes (Signed)
Physical Therapy Treatment Patient Details Name: Anita Fletcher MRN: [id number] DOB: 06-Sep-1945 Today's Date: 02/03/2022   History of Present Illness 76 y.o. female presents to [facility] on 01/29/2022 as a transfer from outside hospital with R side numbness. CT head indicating possible CVA. 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 Pt underwent diagnostic angiogram on 11/2 and angioplasty on 11/3 after recurrent symptoms and CT showing L cerebellar infarcts. PMH includes afib, HTN.    PT Comments    Patient progressing with mobility able to walk further in hallway, but still veering to R and with R side incoordination with high fall risk despite RW.  She c/o dizziness stating had taken Xanax earlier to relax and felt might be cause (VSS throughout).  She also c/o UE fatigue with using RW for ambulation and demonstrated anterior lean onto walker with difficulty grading speed and using UE's to improve control.  She remains appropriate for acute inpatient rehab prior to d/c home.  PT will continue to follow.    Recommendations for follow up therapy are one component of a multi-disciplinary discharge planning process, led by the attending physician.  Recommendations may be updated based on patient status, additional functional criteria and insurance authorization.  Follow Up Recommendations  Acute inpatient rehab (3hours/day)     Assistance Recommended at Discharge Intermittent Supervision/Assistance  Patient can return home with the following A lot of help with walking and/or transfers;A lot of help with bathing/dressing/bathroom;Assistance with cooking/housework;Assist for transportation;Help with stairs or ramp for entrance   Equipment Recommendations  Other (comment) (TBA)    Recommendations for Other Services       Precautions / Restrictions Precautions Precautions: Fall Restrictions Weight Bearing Restrictions: No     Mobility  Bed Mobility Overal bed mobility:  Needs Assistance Bed Mobility: Supine to Sit     Supine to sit: Supervision, HOB elevated          Transfers Overall transfer level: Needs assistance Equipment used: Rolling walker (2 wheels) Transfers: Sit to/from Stand Sit to Stand: Min assist           General transfer comment: up to stand with cues for hand placement and assist for balance    Ambulation/Gait Ambulation/Gait assistance: Mod assist, Min assist Gait Distance (Feet): 70 Feet Assistive device: Rolling walker (2 wheels) Gait Pattern/deviations: Step-through pattern, Decreased stride length, Trunk flexed       General Gait Details: incoordination on R with heavy footfall and increased velocity of movement with foot sliding on floor at times; difficulty with turning and assist to keep from running into desk on R side   Stairs             Wheelchair Mobility    Modified Rankin (Stroke Patients Only) Modified Rankin (Stroke Patients Only) Pre-Morbid Rankin Score: No symptoms Modified Rankin: Moderately severe disability     Balance Overall balance assessment: Needs assistance Sitting-balance support: Feet supported Sitting balance-Leahy Scale: Fair     Standing balance support: Single extremity supported, Bilateral upper extremity supported, Reliant on assistive device for balance Standing balance-Leahy Scale: Poor                 High Level Balance Comments: sit<>stand x 5 with UE support, but not to RW, standing marching in place with RW 2 x 10 reps            Cognition Arousal/Alertness: Awake/alert Behavior During Therapy: WFL for tasks assessed/performed Overall Cognitive Status: Impaired/Different from baseline Area  of Impairment: Awareness, Safety/judgement                         Safety/Judgement: Decreased awareness of deficits     General Comments: patient veering to R in hallway needing instructional cues to avoid running into nursing station; not able  to hear phone on L ear and thought it was not working, could hear on R ear        Exercises      General Comments General comments (skin integrity, edema, etc.): BP 137/80, HR 74, SpO2 99% after ambulation on RA      Pertinent Vitals/Pain Pain Assessment Pain Assessment: No/denies pain    Home Living                          Prior Function            PT Goals (current goals can now be found in the care plan section) Progress towards PT goals: Progressing toward goals    Frequency    Min 4X/week      PT Plan Current plan remains appropriate    Co-evaluation              AM-PAC PT "6 Clicks" Mobility   Outcome Measure  Help needed turning from your back to your side while in a flat bed without using bedrails?: A Little Help needed moving from lying on your back to sitting on the side of a flat bed without using bedrails?: A Little Help needed moving to and from a bed to a chair (including a wheelchair)?: A Little Help needed standing up from a chair using your arms (e.g., wheelchair or bedside chair)?: A Little Help needed to walk in hospital room?: A Little Help needed climbing 3-5 steps with a railing? : Total 6 Click Score: 16    End of Session Equipment Utilized During Treatment: Gait belt Activity Tolerance: Patient tolerated treatment well Patient left: in bed;with call bell/phone within reach;with bed alarm set   PT Visit Diagnosis: Other symptoms and signs involving the nervous system (R29.898);Other abnormalities of gait and mobility (R26.89)     Time: 2119-4174 PT Time Calculation (min) (ACUTE ONLY): 20 min  Charges:  $Gait Training: 8-22 mins                     Sheran Lawless, PT Acute Rehabilitation Services Office:[phone] 02/03/2022    Elray Mcgregor 02/03/2022, 4:43 PM

## 2022-02-03 NOTE — PMR Pre-admission (Signed)
PMR Admission Coordinator Pre-Admission Assessment  Patient: Anita Fletcher is an 76 y.o., female MRN: [id number] DOB: 02/07/1946 Height: 5' 3.5" (161.3 cm) Weight: 74.8 kg  Insurance Information HMO: yes    PPO:      PCP:      IPA:      80/20:      OTHER:  PRIMARY: [employer]      Policy#: [id number]      Subscriber: pt CM Name: Mendel Ryder      Phone#: [phone] Ext [phone]     Fax#: [phone] Pre-Cert#: [id number] approved for 7 days. F/u with Edwena Felty at ext [phone]     Employer:  Benefits:  Phone #: [phone]     Name: 1/6 Eff. Date: 03/31/21     Deduct: none      Out of Pocket Max: $3400      Life Max: none CIR: $295 co pay per day days 1 until 6      SNF: no cop ay days 1 until 20; $196 cop ay ep day days 21 until 100 Outpatient: $10 to $20 per visit     Co-Pay: visits per medical neccesity Home Health: 100%      Co-Pay: visits per medical neccesity DME: 80%     Co-Pay: 20% Providers: in network  SECONDARY: none  Financial Counselor:       Phone#:   [employer]" for patients in Inpatient Rehabilitation Facilities with attached "Privacy Act [place] Records" was provided and verbally reviewed with: Patient  Emergency Contact Information Contact Information     Name Relation Home Work Mobile   [place] Brother [phone]  [phone]   JEFFRIES,WILLIAM Significant other   [phone]      Current Medical History  Patient Admitting Diagnosis: CVA  History of Present Illness: 76 year old female with history of HTN an atrial fibrillation on Apixaban. Also a history of TIA. Presented to [facility] hospital on 01/27/22 with complaints of episodic right face , Right Upper extremity and lower extremity numbness. Transferred to [facility] on 01/29/22.  Neurology consulted. CT scan was concerning for subcortical left hemispheric infraction obscured by severe small vessel disease. MRI brain revealed small infarct in left  thalamus/posterior limb of th internal capsule. 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 Eliquis resumed on 02/02/22. On DAPT with ASA and Brilinta initially and then changed to Brilinta and Eliquis from 02/02/22. HTN initially controlled by Cleviprex and now on Norvasc. LDL 140 and now on Lipitor.    Complete NIHSS TOTAL: 4  Patient's medical [facility] has been reviewed by the rehabilitation admission coordinator and physician.  Past Medical History  Past Medical History:  Diagnosis Date   Acute CVA (cerebrovascular accident) ([place]) 01/27/2022   HTN (hypertension) 01/27/2022   TIA (transient ischemic attack)    Has the patient had major surgery during 100 days prior to admission? Yes  Family History   family history includes Arrhythmia in her brother.  Current Medications  Current Facility-Administered Medications:     stroke: early stages of recovery book, , Does not apply, Once, Emokpae, Ejiroghene E, MD   acetaminophen (TYLENOL) tablet 650 mg, 650 mg,  Oral, Q4H PRN, 650 mg at 02/04/22 0802 **OR** acetaminophen (TYLENOL) 160 MG/5ML solution 650 mg, 650 mg, Per Tube, Q4H PRN **OR** acetaminophen (TYLENOL) suppository 650 mg, 650 mg, Rectal, Q4H PRN, Emokpae, Ejiroghene E, MD   ALPRAZolam (XANAX) tablet 0.25 mg, 0.25 mg, Oral, BID PRN, Little Ishikawa, MD, 0.25 mg at 02/03/22 1432   amLODipine (NORVASC) tablet 5 mg, 5 mg, Oral, Daily, Little Ishikawa, MD, 5 mg at 02/04/22 0802   apixaban (ELIQUIS) tablet 5 mg, 5 mg, Oral, BID, Ventura Sellers, RPH, 5 mg at 02/04/22 0802   atorvastatin (LIPITOR) tablet 40 mg, 40 mg,  Oral, Daily, Emokpae, Ejiroghene E, MD, 40 mg at 02/04/22 0801   hydrALAZINE (APRESOLINE) injection 10 mg, 10 mg, Intravenous, Q4H PRN, Amie Portland, MD, 10 mg at 02/02/22 0648   losartan (COZAAR) tablet 50 mg, 50 mg, Oral, Daily, Beulah Gandy A, NP, 50 mg at 02/04/22 0801   ondansetron (ZOFRAN) injection 4 mg, 4 mg, Intravenous, Q6H PRN, de Sindy Messing, Erven Colla, MD   Oral care mouth rinse, 15 mL, Mouth Rinse, PRN, Amie Portland, MD   senna-docusate (Senokot-S) tablet 1 tablet, 1 tablet, Oral, QHS PRN, Emokpae, Ejiroghene E, MD   senna-docusate (Senokot-S) tablet 1 tablet, 1 tablet, Oral, BID, Garvin Fila, MD, 1 tablet at 02/04/22 0802   [COMPLETED] ticagrelor (BRILINTA) tablet 180 mg, 180 mg, Oral, Once, 180 mg at 01/31/22 2036 **FOLLOWED BY** ticagrelor (BRILINTA) tablet 90 mg, 90 mg, Oral, BID, Rosalin Hawking, MD, 90 mg at 02/04/22 0801  Patients Current Diet:  Diet Order             DIET DYS 3 Room service appropriate? Yes with Assist; Fluid consistency: Thin  Diet effective now                  Precautions / Restrictions Precautions Precautions: Fall Restrictions Weight Bearing Restrictions: No   Has the patient had 2 or more falls or a fall with injury in the past year? No  Prior Activity Level Community (5-7x/wk): independent, working remote, driving  Prior Functional Level Self Care: Did the patient need help bathing, dressing, using the toilet or eating? Independent  Indoor Mobility: Did the patient need assistance with walking from room to room (with or without device)? Independent  Stairs: Did the patient need assistance with internal or external stairs (with or without device)? Independent  Functional Cognition: Did the patient need help planning regular tasks such as shopping or remembering to take medications? Independent  Patient Information Are you of Hispanic, Latino/a,or Spanish origin?: A. No, not of Hispanic, Latino/a, or Spanish origin What is  your race?: A. White Do you need or want an interpreter to communicate with a doctor or health care staff?: 0. No  Patient's Response To:  Health Literacy and Transportation Is the patient able to respond to health literacy and transportation needs?: Yes Health Literacy - How often do you need to have someone help you when you read instructions, pamphlets, or other written material from your doctor or pharmacy?: Never In the past 12 months, has lack of transportation kept you from medical appointments or from getting medications?: No In the past 12 months, has lack of transportation kept you from meetings, work, or from getting things needed for daily living?: No  Home Assistive Devices / [place] Devices/Equipment: None Home Equipment: Conservation officer, nature (2 wheels), Grab bars - tub/shower  Prior Device Use: Indicate devices/aids used by the patient prior to current illness,  exacerbation or injury? None of the above  Current Functional Level Cognition  Overall Cognitive Status: Impaired/Different from baseline Orientation Level: Oriented X4 Safety/Judgement: Decreased awareness of deficits General Comments: patient veering to R in hallway needing instructional cues to avoid running into nursing station; not able to hear phone on L ear and thought it was not working, could hear on R ear    Extremity Assessment (includes Sensation/Coordination)  Upper Extremity Assessment: RUE deficits/detail RUE Deficits / Details: Decreased functional use compared to LUE, but could donn socks sitting EOB (did tend to lean); could not feel light touch throughout whole arm, did feel pain in upper arm when pinched-a little bit at forearm--not at all in hand RUE Sensation: decreased light touch RUE Coordination: decreased fine motor, decreased gross motor  Lower Extremity Assessment: RLE deficits/detail RLE Deficits / Details: ROM and strength WFL RLE Sensation: decreased light touch (no  sensation in R foot) RLE Coordination: decreased gross motor    ADLs  Overall ADL's : Needs assistance/impaired Eating/Feeding: Set up, Sitting Eating/Feeding Details (indicate cue type and reason): in recliner Grooming: Minimal assistance, Sitting Grooming Details (indicate cue type and reason): in recliner Upper Body Bathing: Minimal assistance, Sitting Upper Body Bathing Details (indicate cue type and reason): in recliner Lower Body Bathing: Moderate assistance Lower Body Bathing Details (indicate cue type and reason): min A +2 sit<>stand Upper Body Dressing : Moderate assistance, Sitting Upper Body Dressing Details (indicate cue type and reason): recliner Lower Body Dressing: Moderate assistance Lower Body Dressing Details (indicate cue type and reason): min A +2 sit<>stand Toilet Transfer: Moderate assistance, +2 for physical assistance, Ambulation Toilet Transfer Details (indicate cue type and reason): Bil HHA; simulated bed>door>back to recliner Toileting- Clothing Manipulation and Hygiene: Total assistance Toileting - Clothing Manipulation Details (indicate cue type and reason): min A +2 sit<>stand General ADL Comments: Pt shows good standing balance and abiilty to reach down to B LE for ADL tasks.    Mobility  Overal bed mobility: Needs Assistance Bed Mobility: Supine to Sit Supine to sit: Supervision, HOB elevated    Transfers  Overall transfer level: Needs assistance Equipment used: Rolling walker (2 wheels) Transfers: Sit to/from Stand Sit to Stand: Min assist General transfer comment: up to stand with cues for hand placement and assist for balance    Ambulation / Gait / Stairs / Wheelchair Mobility  Ambulation/Gait Ambulation/Gait assistance: Mod assist, Min assist Gait Distance (Feet): 70 Feet Assistive device: Rolling walker (2 wheels) Gait Pattern/deviations: Step-through pattern, Decreased stride length, Trunk flexed General Gait Details: incoordination on R  with heavy footfall and increased velocity of movement with foot sliding on floor at times; difficulty with turning and assist to keep from running into desk on R side Gait velocity: reduced Gait velocity interpretation: <1.31 ft/sec, indicative of household ambulator    Posture / Balance Balance Overall balance assessment: Needs assistance Sitting-balance support: Feet supported Sitting balance-Leahy Scale: Fair Standing balance support: Single extremity supported, Bilateral upper extremity supported, Reliant on assistive device for balance Standing balance-Leahy Scale: Poor High Level Balance Comments: sit<>stand x 5 with UE support, but not to RW, standing marching in place with RW 2 x 10 reps    Special needs/care consideration    Previous Home Environment  Living Arrangements: Spouse/significant other  Lives With: Significant other Available Help at Discharge: Friend(s), Available 24 hours/day Type of Home: House Home Layout: Two level Alternate Level Stairs-Rails: Left Alternate Level Stairs-Number of Steps: 10+1+1 Home Access: Stairs to enter Entrance Stairs-Rails:  Can reach both Entrance Stairs-Number of Steps: 2 to 3 Bathroom Shower/Tub: Chiropodist: Standard Bathroom Accessibility: Yes How Accessible: Accessible via walker Home Care Services: No Additional Comments: pt manages meds and finances, supervises significant other when he drives  Discharge Living Setting Plans for Discharge Living Setting: Patient's home, Lives with (comment) (significant other of 15 years) Type of Home at Discharge: House Discharge Home Layout: Two level Alternate Level Stairs-Rails: Left Alternate Level Stairs-Number of Steps: 10 + 1 + 1 Discharge Home Access: Stairs to enter Entrance Stairs-Rails: Right, Left, Can reach both Entrance Stairs-Number of Steps: 2 to 3 Discharge Bathroom Shower/Tub: Tub/shower unit Discharge Bathroom Toilet: Standard Discharge Bathroom  Accessibility: Yes How Accessible: Accessible via walker Does the patient have any problems obtaining your medications?: No  Social/Family/Support Systems Patient Roles: Spouse, Caregiver Contact Information: Gwyndolyn Saxon, significant other and brother, Elta [place] Anticipated Caregiver: boyfriend Anticipated Ambulance person Information: see contacts Caregiver Availability: 24/7 Discharge Plan Discussed with Primary Caregiver: Yes Is Caregiver In Agreement with Plan?: Yes Does Caregiver/Family have Issues with Lodging/Transportation while Pt is in Rehab?: Yes  Goals Patient/Family Goal for Rehab: Mod I to supervision with PT, OT and SLP Expected length of stay: ELOS 10 to 12 days Pt/Family Agrees to Admission and willing to participate: Yes Program Orientation Provided & Reviewed with Pt/Caregiver Including Roles  & Responsibilities: Yes  Decrease burden of Care through IP rehab admission: n/a  Possible need for SNF placement upon discharge: not anticipated  Patient Condition: I have reviewed medical records from [facility], spoken with CM, and patient. I met with patient at the bedside for inpatient rehabilitation assessment.  Patient will benefit from ongoing PT and OT, can actively participate in 3 hours of therapy a day 5 days of the week, and can make measurable gains during the admission.  Patient will also benefit from the coordinated team approach during an Inpatient Acute Rehabilitation admission.  The patient will receive intensive therapy as well as Rehabilitation physician, nursing, social worker, and care management interventions.  Due to bladder management, bowel management, safety, skin/wound care, disease management, medication administration, pain management, and patient education the patient requires 24 hour a day rehabilitation nursing.  The patient is currently mod assist overall with mobility and basic ADLs.  Discharge setting and therapy post discharge at home with home  health is anticipated.  Patient has agreed to participate in the Acute Inpatient Rehabilitation Program and will admit today.  Preadmission Screen Completed By:  Cleatrice Burke, 02/04/2022 1:35 PM ______________________________________________________________________   Discussed status with Dr. Naaman Plummer on 02/04/22 at 1334 and received approval for admission today.  Admission Coordinator:  Cleatrice Burke, RN, time 1517 Date 02/04/22   Assessment/Plan: Diagnosis: left thalamic and PLIC infarct  Does the need for close, 24 hr/day Medical supervision in concert with the patient's rehab needs make it unreasonable for this patient to be served in a less intensive setting? Yes Co-Morbidities requiring supervision/potential complications: HTN, a fib, anxiety disorder Due to bladder management, bowel management, safety, skin/wound care, disease management, medication administration, pain management, and patient education, does the patient require 24 hr/day rehab nursing? Yes Does the patient require coordinated care of a physician, rehab nurse, PT, OT, and SLP to address physical and functional deficits in the context of the above medical diagnosis(es)? Yes Addressing deficits in the following areas: balance, endurance, locomotion, strength, transferring, bowel/bladder control, bathing, dressing, feeding, grooming, toileting, cognition, speech, and psychosocial support Can the patient actively participate in an  intensive therapy program of at least 3 hrs of therapy 5 days a week? Yes The potential for patient to make measurable gains while on inpatient rehab is excellent Anticipated functional outcomes upon discharge from inpatient rehab: modified independent and supervision PT, modified independent and supervision OT, modified independent and supervision SLP Estimated rehab length of stay to reach the above functional goals is: 10-12 days Anticipated discharge destination: Home 10. Overall  Rehab/Functional Prognosis: excellent   MD Signature: Meredith Staggers, MD, [place] Director Rehabilitation Services 02/04/2022

## 2022-02-03 NOTE — Progress Notes (Signed)
Referring Physician(s): Wendall Mola PA  Supervising Physician: Pedro Earls  Patient Status:  Anita Fletcher - In-pt  Chief Complaint:  Intermittent right sided numbness including facial numbness . 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 S/p angioplasty with drug -eluting stent performed on 11.3.02 by Dr. Raliegh Ip. de Sindy Messing.   Subjective:  Patient reporting decrease in sensation to right upper extremity.  Allergies: Codeine  Medications: Prior to Admission medications   Medication Sig Start Date End Date Taking? Authorizing Provider  apixaban (ELIQUIS) 5 MG TABS tablet Take 1 tablet (5 mg total) by mouth 2 (two) times daily. 09/23/21  Yes O'Neal, Cassie Freer, MD  losartan (COZAAR) 50 MG tablet Take 1 tablet (50 mg total) by mouth daily. 01/16/22 01/11/23 Yes O'Neal, Cassie Freer, MD  metoprolol tartrate (LOPRESSOR) 25 MG tablet Take 1 tablet (25 mg total) by mouth 2 (two) times daily. 09/24/21  Yes Minus Breeding, MD     Vital Signs: BP 117/66   Pulse 65   Temp 98.9 F (37.2 C) (Axillary)   Resp 18   Ht 5' 3.5" (1.613 m)   Wt 165 lb (74.8 kg)   SpO2 93%   BMI 28.77 kg/m   Physical Exam Vitals and nursing note reviewed.  Constitutional:      Appearance: She is well-developed.  HENT:     Head: Normocephalic and atraumatic.  Eyes:     Conjunctiva/sclera: Conjunctivae normal.  Cardiovascular:     Rate and Rhythm: Normal rate and regular rhythm.  Pulmonary:     Effort: Pulmonary effort is normal.  Musculoskeletal:        General: Normal range of motion.     Cervical back: Normal range of motion.  Skin:    General: Skin is warm.  Neurological:     Mental Status: She is alert and oriented to person, place, and time.     Comments: Alert, aware and oriented X 3 Speech and comprehension is intact.  Left sided facial droop noted Tongue midline Can spontaneously move all 4 extremities. Cognition and language intact.  Comprehension and  fluency are normal.  Judgment and insight normal  Gait not assessed Romberg not assessed Heel to toe not assessed Distal pulses not assessed         Imaging: CT HEAD WO CONTRAST (5MM)  Result Date: 01/31/2022 CLINICAL DATA:  Follow-up examination for stroke. EXAM: CT HEAD WITHOUT CONTRAST TECHNIQUE: Contiguous axial images were obtained from the base of the skull through the vertex without intravenous contrast. RADIATION DOSE REDUCTION: This exam was performed according to the departmental dose-optimization program which includes automated exposure control, adjustment of the mA and/or kV according to patient size and/or use of iterative reconstruction technique. COMPARISON:  Prior CT from 01/27/2022 and MRI from 01/30/2022. FINDINGS: Brain: Sequelae of interval stenting at the basilar artery. 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 No associated hemorrhage or mass effect. Now seen are 8 a few additional small vaulting left cerebellar infarcts (series 3, images 8, 7). No other evidence for acute large vessel territory infarct. No acute intracranial hemorrhage. No mass lesion or midline shift. No hydrocephalus or extra-axial fluid collection. Underlying chronic microvascular ischemic disease noted. 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 Vascular: Interval stent placement at the basilar artery. Calcified atherosclerosis about the skull base. No abnormal hyperdense vessel. Skull: Scalp soft tissues and calvarium demonstrate no new finding. Sinuses/Orbits: Globes orbital soft tissues demonstrate no acute finding. Paranasal sinuses are largely clear.  Small chronic right mastoid effusion, similar to prior. Other: None. IMPRESSION: 1. Sequelae of interval stenting at the basilar artery. 2. 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 No associated hemorrhage or mass effect. 3. Interval development of a few additional small acute left cerebellar  infarcts. No acute intracranial hemorrhage. 4. 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 Electronically Signed   By: Jeannine Boga M.D.   On: 01/31/2022 21:00   DG CHEST PORT 1 VIEW  Result Date: 01/31/2022 CLINICAL DATA:  CVA.  Numbness. EXAM: PORTABLE CHEST 1 VIEW COMPARISON:  08/24/2021 FINDINGS: Stable enlarged cardiac silhouette and large hiatal hernia. Tortuous and calcified thoracic aorta. Interval patchy density at the left lung base and minimal patchy density at the right lung base. Interval small amount of linear atelectasis in the right mid lung zone. Interval minimal right pleural effusion and possible small left pleural effusion. Diffuse osteopenia. IMPRESSION: 1. Interval patchy atelectasis or pneumonia at the left lung base and minimal patchy atelectasis or pneumonia at the right lung base. 2. Interval minimal right pleural effusion and possible small left pleural effusion. 3. Stable cardiomegaly and large hiatal hernia. Electronically Signed   By: Claudie Revering M.D.   On: 01/31/2022 13:44   IR ANGIO INTRA EXTRACRAN SEL COM CAROTID INNOMINATE BILAT MOD SED  Result Date: 01/31/2022 INDICATION: Anita Fletcher is a 76 y.o. female with PMH significant for HTN, atrial fibrillation, and TIA 10 years ago being seen today for symptomatic intracranial stenosis. The patient states that beginning on Sunday, 10/29 she has been having intermittent right sided numbness in her arms and legs which have lasted several minutes. The patient reports waking up the evening of 10/29 with numbness that has persisted until this time. She denies any muscle weakness, but endorses some difficulty with coordination, especially when walking. She comes to our service today for a diagnostic angiogram. EXAM: ULTRASOUND-GUIDED VASCULAR ACCESS DIAGNOSTIC CEREBRAL ANGIOGRAM COMPARISON:  CT angiogram of the head and neck January 27, 2022. MEDICATIONS: 20 mg of  hydralazine intravenous. ANESTHESIA/SEDATION: Moderate (conscious) sedation was employed during this procedure. A total of Versed 0.5 mg and Fentanyl 50 mcg was administered intravenously by the radiology nurse. Total intra-service moderate Sedation Time: 55 minutes. The patient's level of consciousness and vital signs were monitored continuously by radiology nursing throughout the procedure under my direct supervision. CONTRAST:  75 mL of Omnipaque 300 milligram/mL FLUOROSCOPY: Radiation Exposure Index (as provided by the fluoroscopic device): 9450 mGy Kerma COMPLICATIONS: None immediate. TECHNIQUE: Informed written consent was obtained from the patient after a thorough discussion of the procedural risks, benefits and alternatives. All questions were addressed. Maximal Sterile Barrier Technique was utilized including caps, mask, sterile gowns, sterile gloves, sterile drape, hand hygiene and skin antiseptic. A timeout was performed prior to the initiation of the procedure. The right groin was prepped and draped in the usual sterile fashion. Using a micropuncture kit and the modified Seldinger technique, access was gained to the right common femoral artery and a 5 French sheath was placed. Real-time ultrasound guidance was utilized for vascular access including the acquisition of a permanent ultrasound image documenting patency of the accessed vessel. Under fluoroscopy, a 5 [place] Berenstein 2 catheter was navigated over a 0.035" Terumo Glidewire into the aortic arch. The catheter was placed into the left subclavian artery. Frontal and lateral angiograms of the neck were obtained. Using road map guidance, the catheter was placed into the left vertebral artery. Frontal and  lateral angiograms of the neck were obtained. The catheter was then placed into the right common carotid artery. Frontal and lateral angiograms of the neck were obtained followed by angiograms with frontal, lateral, magnified right anterior oblique  and magnified lateral views of the head. Next, the catheter was placed into the left common carotid artery. Frontal and lateral angiograms of the neck were obtained followed by frontal and lateral angiograms of the head. The catheter was subsequently withdrawn. Right common femoral artery angiogram was obtained in right anterior oblique view. The puncture is at the level of the common femoral artery. The sheath was exchanged over the wire for a Perclose ProGlide which was utilized for access closure. Immediate hemostasis was achieved. FINDINGS: Left subclavian angiograms: Atherosclerotic changes of the left subclavian artery distal to the thyrocervical trunk origin with mild stenosis. Increased tortuosity of the left vertebral artery, severe at the V1 segment and mild at the V2 segment, without hemodynamically significant stenosis. Left vertebral artery angiograms: Atherosclerotic changes of the intracranial left vertebral artery with mild stenosis. 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 The right vertebral artery V4 segment is seen via contrast reflux and shows mild atherosclerotic changes without hemodynamically significant stenosis. No opacification of the right ACA vascular tree related to origin directly from the right ICA (fetal PCA). Contrast opacification of the P1 segment of the left posterior cerebral artery is seen with minimal contrast opacification of the V2 segment due to washout from the left posterior communicating artery. No aneurysms or abnormally high-flow, early draining veins are seen. No regions of abnormal hypervascularity are noted. The visualized dural sinuses are patent. Right CCA angiograms: Cervical angiograms show extreme tortuosity of the right common carotid artery and proximal right internal carotid artery. There are no hemodynamically significant stenoses. Right CCA angiograms-cranial views: Images are degraded by motion.  There is brisk vascular contrast filling of the right ACA and MCA vascular trees. There is also brisk contrast opacification of the right PCA vascular tree (fetal PCA). Contrast opacification of the left ACA vascular tree is also seen with rapid washout. There is luminal irregularity along the right carotid siphon and right MCA vascular tree consistent with mild atherosclerotic disease without high-grade stenosis. Prominent luminal irregularity is seen along right PCA vascular tree with multifocal areas of mild stenosis, more pronounced at the P2 segment. No aneurysms or abnormally high-flow, early draining veins are seen. The visualized dural sinuses are patent. The visualized branches of the right external carotid artery are unremarkable. Left CCA angiograms: Cervical angiograms show prominent tortuosity of the left common carotid artery and proximal left internal carotid artery. Atherosclerotic changes in the left carotid bulb with an ulcerated plaque. There is no significant stenosis. Left CCA angiograms-cranial views: Images are degraded by motion. Luminal irregularity along the right carotid siphon, right ACA and right MCA vascular trees are noted, consistent with intracranial atherosclerotic disease. There is no significant stenosis in the intracranial right ICA. Multifocal areas of mild stenosis are noted along the left MCA vascular tree. Short segment of severe stenosis is seen in the left A1/ACA segment and moderate stenosis at the A3 segment. Severe atherosclerotic changes are noted in the left PCA vascular tree with moderate stenosis at the posterior communicating artery, severe stenosis in the mid P2/PCA segment and multifocal areas of mild stenosis along the PCA vascular tree. No aneurysms or abnormally high-flow, early draining veins are seen. No regions of abnormal hypervascularity are noted. The visualized dural sinuses  are patent. The visualized branches of the left external carotid artery are  unremarkable. Right common femoral artery angiograms: The access is at the level of the mid right common femoral artery. The artery has mild atherosclerotic changes without significant stenosis, adequate for closure device. PROCEDURE: No intervention performed. IMPRESSION: 1. 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 2. Atherosclerotic changes of the anterior circulation with short segment of severe stenosis at the left A1/ACA segment and moderate stenosis at the left A3/ACA segment. 3. Extreme tortuosity of the major neck arteries, may be related to longstanding hypertension. 4. Ulcerated plaque in the left carotid bulb without stenosis. 5. No stenosis of the right carotid bifurcation. PLAN: Findings to be discussed with primary team for management decision. Electronically Signed   By: Pedro Earls M.D.   On: 01/31/2022 11:05   IR ANGIO VERTEBRAL SEL VERTEBRAL UNI L MOD SED  Result Date: 01/31/2022 INDICATION: Anita Fletcher is a 76 y.o. female with PMH significant for HTN, atrial fibrillation, and TIA 10 years ago being seen today for symptomatic intracranial stenosis. The patient states that beginning on Sunday, 10/29 she has been having intermittent right sided numbness in her arms and legs which have lasted several minutes. The patient reports waking up the evening of 10/29 with numbness that has persisted until this time. She denies any muscle weakness, but endorses some difficulty with coordination, especially when walking. She comes to our service today for a diagnostic angiogram. EXAM: ULTRASOUND-GUIDED VASCULAR ACCESS DIAGNOSTIC CEREBRAL ANGIOGRAM COMPARISON:  CT angiogram of the head and neck January 27, 2022. MEDICATIONS: 20 mg of hydralazine intravenous. ANESTHESIA/SEDATION: Moderate (conscious) sedation was employed during this procedure. A total of  Versed 0.5 mg and Fentanyl 50 mcg was administered intravenously by the radiology nurse. Total intra-service moderate Sedation Time: 55 minutes. The patient's level of consciousness and vital signs were monitored continuously by radiology nursing throughout the procedure under my direct supervision. CONTRAST:  75 mL of Omnipaque 300 milligram/mL FLUOROSCOPY: Radiation Exposure Index (as provided by the fluoroscopic device): 2080 mGy Kerma COMPLICATIONS: None immediate. TECHNIQUE: Informed written consent was obtained from the patient after a thorough discussion of the procedural risks, benefits and alternatives. All questions were addressed. Maximal Sterile Barrier Technique was utilized including caps, mask, sterile gowns, sterile gloves, sterile drape, hand hygiene and skin antiseptic. A timeout was performed prior to the initiation of the procedure. The right groin was prepped and draped in the usual sterile fashion. Using a micropuncture kit and the modified Seldinger technique, access was gained to the right common femoral artery and a 5 French sheath was placed. Real-time ultrasound guidance was utilized for vascular access including the acquisition of a permanent ultrasound image documenting patency of the accessed vessel. Under fluoroscopy, a 5 [place] Berenstein 2 catheter was navigated over a 0.035" Terumo Glidewire into the aortic arch. The catheter was placed into the left subclavian artery. Frontal and lateral angiograms of the neck were obtained. Using road map guidance, the catheter was placed into the left vertebral artery. Frontal and lateral angiograms of the neck were obtained. The catheter was then placed into the right common carotid artery. Frontal and lateral angiograms of the neck were obtained followed by angiograms with frontal, lateral, magnified right anterior oblique and magnified lateral views of the head. Next, the catheter was placed into the left common carotid artery. Frontal and  lateral angiograms  of the neck were obtained followed by frontal and lateral angiograms of the head. The catheter was subsequently withdrawn. Right common femoral artery angiogram was obtained in right anterior oblique view. The puncture is at the level of the common femoral artery. The sheath was exchanged over the wire for a Perclose ProGlide which was utilized for access closure. Immediate hemostasis was achieved. FINDINGS: Left subclavian angiograms: Atherosclerotic changes of the left subclavian artery distal to the thyrocervical trunk origin with mild stenosis. Increased tortuosity of the left vertebral artery, severe at the V1 segment and mild at the V2 segment, without hemodynamically significant stenosis. Left vertebral artery angiograms: Atherosclerotic changes of the intracranial left vertebral artery with mild stenosis. 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 The right vertebral artery V4 segment is seen via contrast reflux and shows mild atherosclerotic changes without hemodynamically significant stenosis. No opacification of the right ACA vascular tree related to origin directly from the right ICA (fetal PCA). Contrast opacification of the P1 segment of the left posterior cerebral artery is seen with minimal contrast opacification of the V2 segment due to washout from the left posterior communicating artery. No aneurysms or abnormally high-flow, early draining veins are seen. No regions of abnormal hypervascularity are noted. The visualized dural sinuses are patent. Right CCA angiograms: Cervical angiograms show extreme tortuosity of the right common carotid artery and proximal right internal carotid artery. There are no hemodynamically significant stenoses. Right CCA angiograms-cranial views: Images are degraded by motion. There is brisk vascular contrast filling of the right ACA and MCA vascular trees. There is also brisk contrast  opacification of the right PCA vascular tree (fetal PCA). Contrast opacification of the left ACA vascular tree is also seen with rapid washout. There is luminal irregularity along the right carotid siphon and right MCA vascular tree consistent with mild atherosclerotic disease without high-grade stenosis. Prominent luminal irregularity is seen along right PCA vascular tree with multifocal areas of mild stenosis, more pronounced at the P2 segment. No aneurysms or abnormally high-flow, early draining veins are seen. The visualized dural sinuses are patent. The visualized branches of the right external carotid artery are unremarkable. Left CCA angiograms: Cervical angiograms show prominent tortuosity of the left common carotid artery and proximal left internal carotid artery. Atherosclerotic changes in the left carotid bulb with an ulcerated plaque. There is no significant stenosis. Left CCA angiograms-cranial views: Images are degraded by motion. Luminal irregularity along the right carotid siphon, right ACA and right MCA vascular trees are noted, consistent with intracranial atherosclerotic disease. There is no significant stenosis in the intracranial right ICA. Multifocal areas of mild stenosis are noted along the left MCA vascular tree. Short segment of severe stenosis is seen in the left A1/ACA segment and moderate stenosis at the A3 segment. Severe atherosclerotic changes are noted in the left PCA vascular tree with moderate stenosis at the posterior communicating artery, severe stenosis in the mid P2/PCA segment and multifocal areas of mild stenosis along the PCA vascular tree. No aneurysms or abnormally high-flow, early draining veins are seen. No regions of abnormal hypervascularity are noted. The visualized dural sinuses are patent. The visualized branches of the left external carotid artery are unremarkable. Right common femoral artery angiograms: The access is at the level of the mid right common femoral  artery. The artery has mild atherosclerotic changes without significant stenosis, adequate for closure device. PROCEDURE: No intervention performed. IMPRESSION: 1. 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 2. Atherosclerotic changes of the anterior circulation with short segment of severe stenosis at the left A1/ACA segment and moderate stenosis at the left A3/ACA segment. 3. Extreme tortuosity of the major neck arteries, may be related to longstanding hypertension. 4. Ulcerated plaque in the left carotid bulb without stenosis. 5. No stenosis of the right carotid bifurcation. PLAN: Findings to be discussed with primary team for management decision. Electronically Signed   By: Pedro Earls M.D.   On: 01/31/2022 11:05   IR US Guide Vasc Access Right  Result Date: 01/31/2022 INDICATION: Anita Fletcher is a 77 y.o. female with PMH significant for HTN, atrial fibrillation, and TIA 10 years ago being seen today for symptomatic intracranial stenosis. The patient states that beginning on Sunday, 10/29 she has been having intermittent right sided numbness in her arms and legs which have lasted several minutes. The patient reports waking up the evening of 10/29 with numbness that has persisted until this time. She denies any muscle weakness, but endorses some difficulty with coordination, especially when walking. She comes to our service today for a diagnostic angiogram. EXAM: ULTRASOUND-GUIDED VASCULAR ACCESS DIAGNOSTIC CEREBRAL ANGIOGRAM COMPARISON:  CT angiogram of the head and neck January 27, 2022. MEDICATIONS: 20 mg of hydralazine intravenous. ANESTHESIA/SEDATION: Moderate (conscious) sedation was employed during this procedure. A total of Versed 0.5 mg and Fentanyl 50 mcg was administered intravenously by the radiology nurse. Total intra-service moderate Sedation Time:  55 minutes. The patient's level of consciousness and vital signs were monitored continuously by radiology nursing throughout the procedure under my direct supervision. CONTRAST:  75 mL of Omnipaque 300 milligram/mL FLUOROSCOPY: Radiation Exposure Index (as provided by the fluoroscopic device): 0962 mGy Kerma COMPLICATIONS: None immediate. TECHNIQUE: Informed written consent was obtained from the patient after a thorough discussion of the procedural risks, benefits and alternatives. All questions were addressed. Maximal Sterile Barrier Technique was utilized including caps, mask, sterile gowns, sterile gloves, sterile drape, hand hygiene and skin antiseptic. A timeout was performed prior to the initiation of the procedure. The right groin was prepped and draped in the usual sterile fashion. Using a micropuncture kit and the modified Seldinger technique, access was gained to the right common femoral artery and a 5 French sheath was placed. Real-time ultrasound guidance was utilized for vascular access including the acquisition of a permanent ultrasound image documenting patency of the accessed vessel. Under fluoroscopy, a 5 [place] Berenstein 2 catheter was navigated over a 0.035" Terumo Glidewire into the aortic arch. The catheter was placed into the left subclavian artery. Frontal and lateral angiograms of the neck were obtained. Using road map guidance, the catheter was placed into the left vertebral artery. Frontal and lateral angiograms of the neck were obtained. The catheter was then placed into the right common carotid artery. Frontal and lateral angiograms of the neck were obtained followed by angiograms with frontal, lateral, magnified right anterior oblique and magnified lateral views of the head. Next, the catheter was placed into the left common carotid artery. Frontal and lateral angiograms of the neck were obtained followed by frontal and lateral angiograms of the head. The catheter was subsequently  withdrawn. Right common femoral artery angiogram was obtained in right anterior oblique view. The puncture is at the level of the common femoral artery. The sheath was exchanged over the wire for a Perclose ProGlide which was utilized for access closure. Immediate hemostasis was achieved.  FINDINGS: Left subclavian angiograms: Atherosclerotic changes of the left subclavian artery distal to the thyrocervical trunk origin with mild stenosis. Increased tortuosity of the left vertebral artery, severe at the V1 segment and mild at the V2 segment, without hemodynamically significant stenosis. Left vertebral artery angiograms: Atherosclerotic changes of the intracranial left vertebral artery with mild stenosis. 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 The right vertebral artery V4 segment is seen via contrast reflux and shows mild atherosclerotic changes without hemodynamically significant stenosis. No opacification of the right ACA vascular tree related to origin directly from the right ICA (fetal PCA). Contrast opacification of the P1 segment of the left posterior cerebral artery is seen with minimal contrast opacification of the V2 segment due to washout from the left posterior communicating artery. No aneurysms or abnormally high-flow, early draining veins are seen. No regions of abnormal hypervascularity are noted. The visualized dural sinuses are patent. Right CCA angiograms: Cervical angiograms show extreme tortuosity of the right common carotid artery and proximal right internal carotid artery. There are no hemodynamically significant stenoses. Right CCA angiograms-cranial views: Images are degraded by motion. There is brisk vascular contrast filling of the right ACA and MCA vascular trees. There is also brisk contrast opacification of the right PCA vascular tree (fetal PCA). Contrast opacification of the left ACA vascular tree is also seen with rapid washout.  There is luminal irregularity along the right carotid siphon and right MCA vascular tree consistent with mild atherosclerotic disease without high-grade stenosis. Prominent luminal irregularity is seen along right PCA vascular tree with multifocal areas of mild stenosis, more pronounced at the P2 segment. No aneurysms or abnormally high-flow, early draining veins are seen. The visualized dural sinuses are patent. The visualized branches of the right external carotid artery are unremarkable. Left CCA angiograms: Cervical angiograms show prominent tortuosity of the left common carotid artery and proximal left internal carotid artery. Atherosclerotic changes in the left carotid bulb with an ulcerated plaque. There is no significant stenosis. Left CCA angiograms-cranial views: Images are degraded by motion. Luminal irregularity along the right carotid siphon, right ACA and right MCA vascular trees are noted, consistent with intracranial atherosclerotic disease. There is no significant stenosis in the intracranial right ICA. Multifocal areas of mild stenosis are noted along the left MCA vascular tree. Short segment of severe stenosis is seen in the left A1/ACA segment and moderate stenosis at the A3 segment. Severe atherosclerotic changes are noted in the left PCA vascular tree with moderate stenosis at the posterior communicating artery, severe stenosis in the mid P2/PCA segment and multifocal areas of mild stenosis along the PCA vascular tree. No aneurysms or abnormally high-flow, early draining veins are seen. No regions of abnormal hypervascularity are noted. The visualized dural sinuses are patent. The visualized branches of the left external carotid artery are unremarkable. Right common femoral artery angiograms: The access is at the level of the mid right common femoral artery. The artery has mild atherosclerotic changes without significant stenosis, adequate for closure device. PROCEDURE: No intervention  performed. IMPRESSION: 1. 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 2. Atherosclerotic changes of the anterior circulation with short segment of severe stenosis at the left A1/ACA segment and moderate stenosis at the left A3/ACA segment. 3. Extreme tortuosity of the major neck arteries, may be related to longstanding hypertension. 4. Ulcerated plaque in the left carotid bulb  without stenosis. 5. No stenosis of the right carotid bifurcation. PLAN: Findings to be discussed with primary team for management decision. Electronically Signed   By: Pedro Earls M.D.   On: 01/31/2022 11:05   MR BRAIN WO CONTRAST  Result Date: 01/30/2022 CLINICAL DATA:  Neuro deficit, stroke suspected. EXAM: MRI HEAD WITHOUT CONTRAST TECHNIQUE: Multiplanar, multiecho pulse sequences of the brain and surrounding structures were obtained without intravenous contrast. COMPARISON:  Noncontrast head CT 01/27/2022 FINDINGS: Brain: There is a small acute infarct in the left thalamus/posterior limb of the internal capsule without hemorrhage or mass effect. There is no other evidence of acute infarct. Background parenchymal volume is normal. The ventricles are normal in size. There is T2/FLAIR hyperintensity in the bilateral lentiform nuclei, thalami, and right caudate head consistent with prior ischemia. Additional patchy and confluent FLAIR signal abnormality in the supratentorial brain likely reflects underlying advanced chronic small-vessel ischemic change. There are scattered punctate chronic microhemorrhages, favored hypertensive in etiology. There is no mass lesion.  There is no mass effect or midline shift. Vascular: Normal flow voids. Skull and upper cervical spine: Normal marrow signal. Sinuses/Orbits: The paranasal sinuses are clear. A right lens implant is noted. The globes and orbits are  otherwise unremarkable. Other: There are bilateral mastoid effusions. IMPRESSION: 1. 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 2. Small remote infarcts and background advanced chronic small-vessel ischemic change as above. 3. Scattered punctate chronic microhemorrhages, favored hypertensive in etiology. These results will be called to the ordering clinician or representative by the Radiologist Assistant, and communication documented in the PACS or [employer]. Electronically Signed   By: Valetta Mole M.D.   On: 01/30/2022 14:16    Labs:  CBC: Recent Labs    01/27/22 1352 01/31/22 0320 01/31/22 1331 01/31/22 1549 02/01/22 0011 02/03/22 0509  WBC 7.1 7.9  --   --  6.4 7.6  HGB 14.9 14.0 13.6 13.6 13.1 13.9  HCT 44.4 40.8 40.0 40.0 39.4 40.9  PLT 253 214  --   --  205 236    COAGS: Recent Labs    01/30/22 0354 02/01/22 0011  INR 1.1  --   APTT  --  32    BMP: Recent Labs    01/27/22 1352 01/31/22 0320 01/31/22 1331 01/31/22 1549 02/01/22 0011 02/03/22 0509  NA 141 141 143 142 141 141  K 3.6 3.0* 3.5 3.5 3.4* 3.3*  CL 107 112*  --   --  110 109  CO2 26 22  --   --  21* 23  GLUCOSE 82 107*  --   --  118* 96  BUN 11 12  --   --  10 14  CALCIUM 9.0 8.3*  --   --  8.7* 8.7*  CREATININE 0.76 0.79  --   --  0.64 0.79  GFRNONAA >60 >60  --   --  >60 >60    LIVER FUNCTION TESTS: Recent Labs    08/24/21 1507 01/27/22 1352  BILITOT 0.6 0.5  AST 20 14*  ALT 15 15  ALKPHOS 58 56  PROT 6.8 6.8  ALBUMIN 3.9 3.8    Assessment and Plan:  76 y.o. female inpatient. History of HTN. A fib, TIA. Presented to the ED at [facility] on 10.30.23 with intermittent right sided numbness including facial numbness X 3 days and persistent X 2 days. Patient was transferred to [facility] for further evaluation.  Found to  have a severe intracranial atherosclerotic disease. Cerebral angiogram performed on 11.2.23  showed  severe  basilar artery stenosis. S/p angioplasty with drug -eluting stent performed on 11.3.02 by Dr. Raliegh Ip. de Sindy Messing. Fletcher stay complication by the need for bipap post procedure. The patient's respiratory status improved however she was found to have a left side facial droop, decrease in right upper extremity sensation and right sided ataxia.   Patient seen at bedside with Dr. Raliegh Ip. Karenann Cai. She is alert and oriented X 4. Left eft facial droop. Patient reporting decrease in sensation to her right upper extremity and concern about her ability to drive when she is discharged. All the patient's questions and concerns were answered.  Anticipate Anita Fletcher to be discharged home on Eliquis and Brilinta. ASA to be discontinued.   Patient to be seen in [facility]. Follow up order placed. IR scheduler will call with appointment date and time. Please call [phone] with any questions or concerns    Electronically Signed: Jacqualine Mau, NP 02/03/2022, 10:06 AM   I spent a total of 25 Minutes at the patient's bedside AND on the patient's Fletcher floor or unit, greater than 50% of which was counseling/coordinating care for severe basilar artery stenosis. S/p angioplasty with drug -eluting stent performed on 11.3.02 by Dr. Raliegh Ip. de Sindy Messing.

## 2022-02-03 NOTE — Progress Notes (Signed)
    Inpatient Rehabilitation Admissions Coordinator   Met with patient at bedside for rehab assessment. We discussed goals and expectations of a possible CIR admit. They prefer CIR for rehab. Family can provide expected caregiver support that is recommended. I will begin insurance Auth with [facility] Medicare for possible CIR admit pending approval. Please call me with any questions.   Danne Baxter, RN, MSN Rehab Admissions Coordinator [phone]

## 2022-02-03 NOTE — Progress Notes (Signed)
STROKE TEAM PROGRESS NOTE   INTERVAL HISTORY Patient is lying in bed comfortably.  The right-sided ataxia and weakness seem to have improved.  She denies any double vision.  There is few beats of nystagmus in the right lateral gaze and saccadic dysmetria with normal hemiplegia. K+ replacement per pharmacy Last BM 11/1  No family is at bedside Some continued ataxia Pending transfer out of ICU  Off of Cleviprex, home antihypertensives resumed yesterday. BP stable.  On Eliquis, heparin gtt off      Vitals:   02/03/22 0600 02/03/22 0700 02/03/22 0800 02/03/22 0900  BP: (!) 166/80 (!) 165/85 (!) 157/92 117/66  Pulse: 60 68 64 65  Resp: 13 14 18 18   Temp:   98.9 F (37.2 C)   TempSrc:   Axillary   SpO2: 97% 96% 98% 93%  Weight:      Height:       CBC:  Recent Labs  Lab 01/27/22 1352 01/31/22 0320 02/01/22 0011 02/03/22 0509  WBC 7.1   < > 6.4 7.6  NEUTROABS 4.4  --   --   --   HGB 14.9   < > 13.1 13.9  HCT 44.4   < > 39.4 40.9  MCV 92.7   < > 92.3 92.1  PLT 253   < > 205 236   < > = values in this interval not displayed.    Basic Metabolic Panel:  Recent Labs  Lab 02/01/22 0011 02/03/22 0509  NA 141 141  K 3.4* 3.3*  CL 110 109  CO2 21* 23  GLUCOSE 118* 96  BUN 10 14  CREATININE 0.64 0.79  CALCIUM 8.7* 8.7*    Lipid Panel:  Recent Labs  Lab 01/28/22 0319  CHOL 226*  TRIG 253*  HDL 35*  CHOLHDL 6.5  VLDL 51*  LDLCALC 140*    HgbA1c:  Recent Labs  Lab 01/28/22 0319  HGBA1C 5.5     IMAGING past 24 hours No results found.  PHYSICAL EXAM  Temp:  [97.6 F (36.4 C)-98.9 F (37.2 C)] 98.9 F (37.2 C) (11/06 0800) Pulse Rate:  [52-68] 65 (11/06 0900) Resp:  [8-26] 18 (11/06 0900) BP: (117-177)/(57-103) 117/66 (11/06 0900) SpO2:  [91 %-98 %] 93 % (11/06 0900)  General - Well nourished, well developed, in no apparent distress. Cardiovascular - Regular rhythm and rate, not in afib.  Mental Status -  Level of arousal and orientation to time,  place, and person were intact. Language including expression, naming, repetition, comprehension was assessed and found intact. Fund of Knowledge was assessed and was intact.  Cranial Nerves II - XII - II - Visual field intact OU. III, IV, VI - Extraocular movements intact.  Few beats of nystagmus on right lateral gaze.  Saccadic dysmetria on right gaze V - Facial sensation decreased on the right VII - right facial VIII - Hearing & vestibular intact bilaterally. X - Palate elevates symmetrically. XI - Chin turning & shoulder shrug intact bilaterally. XII - Tongue protrusion intact.  Motor Strength - Left is 5/5, right arm and leg 4/5 with slight drift.  Bulk was normal and fasciculations were absent.   Motor Tone - Muscle tone was assessed at the neck and appendages and was normal.  Reflexes - The patient's reflexes were symmetrical in all extremities and she had no pathological reflexes.  Sensory - Light touch, temperature/pinprick were assessed and were decreased on the right UE and LE.   Coordination - The patient had normal movements  in the left hand and foot with no ataxia or dysmetria, but mild dysmetria on the R FTN and ataxia on the R HTS.  Tremor was absent.  Gait and Station - deferred.   ASSESSMENT/PLAN Anita Fletcher is a 76 y.o. female presenting with right sided stuttering sensory deficits starting 3 days ago that then became persistent; the 3rd episode's symptoms have not remitted. She was found to have subtle right hemiparesis, and sensory ataxia causing gait difficulties when arrived at [facility].her exam is limited @ this time , however when she arrived @ [facility] her exam revealed right hemiataxia, subtle righ sided weakness and right sided hypoesthesia her symptoms were concerning for subcortical left hemispheric ischemic infarction obscured by severe small vessel disease on head CT Scan. MRI Brain w/o contrast (today) reveals small infarct in left  thalamus/posterior limb of the internal capsule w/o hemorrhage or mass effect. Small remote infarcts and background w/ advanced chronic small vessel ischemic changes. She is now s/p cerebral angiogram showing a extreme tortuosity of major neck arteries ,Severe tortuosity of the proximal basilar artery resulting in 85% stenosis.  Ulcerated plaque in the left carotid bulb.  No significant stenosis.  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 Large vessel disease from BA stenosis s/p elective basilar artery angioplasty and stenting for severe stenosis with postprocedure bilateral cerebellar infarcts CT no acute abnormality.   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   MRI showed left lateral thalamic infarct.   Cerebral angiogram today showed extreme tortuosity of major neck arteries, severe tortuosity of the proximal basilar artery resulting in 85% stenosis, Ulcerated plaque in the left carotid bulb.  No significant stenosis.  Bilateral fetal PCAs with severe atherosclerotic disease, greater on the left side where there is moderate stenosis at the P2 segment.  EF 70 to 75%.   LDL 140, TG 253 A1c 5.5.   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   Cerebral angiogram today showed extreme tortuosity of major neck arteries, severe tortuosity of the proximal basilar artery resulting in 85% stenosis. Pt agreed with BA stenting today with Dr. Sherlon Handing  Afib On eliqiis at home Current on hold for BA stent May  consider to resume eliquis 24 h post stenting  Hypertension Stable On cleviprex drip wean as tolerated  On Norvasc 5mg   Long term BP goal normotensive  Hyperlipidemia Home meds:  none  LDL 140, goal < 70 Now on lipitor 40 Continue statin at discharge  Other Stroke Risk Factors Advanced age Hx of TIA  Other Active Problems Hypokalemia K 3.4 replace. Check in am   Hospital day # 7  Patient developed cerebellar infarcts following basilar artery angioplasty stenting.  Continue Mobilize out of bed.  Therapy consults.  Continue Brilinta and Eliquis.  Transfer to neurology floor bed.  Transfer to inpatient rehab in the next few days when bed available.  Discussed with patient and family and Dr. .  This patient is critically ill and at significant risk of neurological worsening, death and care requires constant  monitoring of vital signs, hemodynamics,respiratory and cardiac monitoring, extensive review of multiple databases, frequent neurological assessment, discussion with family, other specialists and medical decision making of high complexity.I have made any additions or clarifications directly to the above note.This critical care time does not reflect procedure time, or teaching time or supervisory time of PA/NP/Med Resident etc but could involve care discussion time.  I spent 30 minutes of neurocritical care time  in the care of  this patient.     Delia Heady, MD Medical Director [facility] Pager: [phone] 02/03/2022 10:03 AM   To contact Stroke Continuity provider, please refer to [email or web address]. After hours, contact General Neurology

## 2022-02-03 NOTE — Progress Notes (Signed)
PROGRESS NOTE    Anita Fletcher  TOI:[id number] DOB: 1945/11/25 DOA: 01/27/2022 PCP: Patient, No Pcp Per   Brief Narrative:  76 year old female with a history of paroxysmal atrial fibrillation on anticoagulation, hypertension, prior history of TIA, presents to the hospital with right arm, right face, right leg numbness.  CT head indicated possible CVA.  She is being admitted to [facility] for further evaluation by neurology and MRI brain. Anticipating transfer today and Neurology team aware.  Patient medically stable for discharge, awaiting further disposition recommendations, tentatively planned for discharge to CIR once bed is available and has insurance approval.  Assessment & Plan:   Principal Problem:   Acute CVA (cerebrovascular accident) [facility]) Active Problems:   Paroxysmal atrial fibrillation ([place])   HTN (hypertension)   Intracranial atherosclerosis  Assessment and Plan:  Acute CVA Recurrent episode 11/3 -Confirmed on MRI that required sedation 01-30-22 -Repeat CT head showing interval development of additional small acute left cerebellar infarcts without hemorrhage -Neurology/vascular following, appreciate insight recommendations -Likely secondary to below -Right sided numbness improving but not yet resolved -PT OT following, recommending inpatient rehab given ongoing instability and ambulatory dysfunction in the setting of paresthesias   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  -Basilar artery stent placement 01/31/2022 - tolerated well -Continue Eliquis and Brilinta, aspirin discontinued per discussion with neurology  Acute respiratory insufficiency with hypoxia post operatively, resolved -Initially on bipap - weaned to [facility] -currently on room air -Ambulatory oxygen screening as indicated if symptomatic  Hypertension -Resume home medications in the next 24 hours   Paroxysmal atrial fibrillation -Currently in sinus rhythm -Reported compliance with  Eliquis prior to admission -Previously on aspirin Plavix -Transition to Eliquis/Brilinta as above   Profound Anxiety/Claustrophobia -Severe claustrophobia at baseline, has difficulty even riding in elevators -Xanax ordered as needed for anxiety -MRI completed with sedation, appreciate radiology and anesthesia's assistance   DVT prophylaxis: enoxaparin   Code Status: Full code Family Communication: Discussed with patient Disposition Plan: Inpatient rehab Remains inpatient appropriate because: Continued work-up in the setting of acute stroke   Consultants:  Neurology   Antimicrobials:   None   Subjective: No acute issues or events overnight denies nausea vomiting diarrhea constipation headache fevers chills or chest pain  Objective: Vitals:   02/03/22 0500 02/03/22 0515 02/03/22 0600 02/03/22 0700  BP: (!) 177/87 (!) 160/81 (!) 166/80 (!) 167/104  Pulse: 63 61 60 68  Resp: (!) 22 (!) 21 13 14   Temp:      TempSrc:      SpO2: 97% 98% 97% 96%  Weight:      Height:        Intake/Output Summary (Last 24 hours) at 02/03/2022 0735 Last data filed at 02/03/2022 0558 Gross per 24 hour  Intake 120 ml  Output 1250 ml  Net -1130 ml    Filed Weights   01/27/22 1300 01/30/22 1121  Weight: 76.2 kg 74.8 kg    Examination:  General:  Pleasantly resting in bed, No acute distress. HEENT:  Normocephalic atraumatic.  Sclerae nonicteric, noninjected.  Extraocular movements intact bilaterally. Neck:  Without mass or deformity.  Trachea is midline. Lungs:  Clear to auscultate bilaterally without rhonchi, wheeze, or rales. Heart:  Regular rate and rhythm.  Without murmurs, rubs, or gallops. Abdomen:  Soft, nontender, nondistended.  Without guarding or rebound. Extremities: Without cyanosis, clubbing, edema, or obvious deformity. Vascular:  Dorsalis pedis and posterior tibial pulses palpable bilaterally. Skin:  Warm and dry, no erythema, no ulcerations.  Data Reviewed: I have  personally reviewed following labs and imaging studies  CBC: Recent Labs  Lab 01/27/22 1352 01/31/22 0320 01/31/22 1331 01/31/22 1549 02/01/22 0011 02/03/22 0509  WBC 7.1 7.9  --   --  6.4 7.6  NEUTROABS 4.4  --   --   --   --   --   HGB 14.9 14.0 13.6 13.6 13.1 13.9  HCT 44.4 40.8 40.0 40.0 39.4 40.9  MCV 92.7 92.1  --   --  92.3 92.1  PLT 253 214  --   --  205 236    Basic Metabolic Panel: Recent Labs  Lab 01/27/22 1352 01/31/22 0320 01/31/22 1331 01/31/22 1549 02/01/22 0011 02/03/22 0509  NA 141 141 143 142 141 141  K 3.6 3.0* 3.5 3.5 3.4* 3.3*  CL 107 112*  --   --  110 109  CO2 26 22  --   --  21* 23  GLUCOSE 82 107*  --   --  118* 96  BUN 11 12  --   --  10 14  CREATININE 0.76 0.79  --   --  0.64 0.79  CALCIUM 9.0 8.3*  --   --  8.7* 8.7*    GFR: Estimated Creatinine Clearance: 60.5 mL/min (by C-G formula based on SCr of 0.79 mg/dL). Liver Function Tests: Recent Labs  Lab 01/27/22 1352  AST 14*  ALT 15  ALKPHOS 56  BILITOT 0.5  PROT 6.8  ALBUMIN 3.8    No results for input(s): "LIPASE", "AMYLASE" in the last 168 hours. No results for input(s): "AMMONIA" in the last 168 hours. Coagulation Profile: Recent Labs  Lab 01/30/22 0354  INR 1.1    Cardiac Enzymes: No results for input(s): "CKTOTAL", "CKMB", "CKMBINDEX", "TROPONINI" in the last 168 hours. BNP (last 3 results) No results for input(s): "PROBNP" in the last 8760 hours. HbA1C: No results for input(s): "HGBA1C" in the last 72 hours.  CBG: Recent Labs  Lab 01/27/22 1354 01/31/22 1827  GLUCAP 77 136*    Lipid Profile: No results for input(s): "CHOL", "HDL", "LDLCALC", "TRIG", "CHOLHDL", "LDLDIRECT" in the last 72 hours.  Thyroid Function Tests: No results for input(s): "TSH", "T4TOTAL", "FREET4", "T3FREE", "THYROIDAB" in the last 72 hours. Anemia Panel: No results for input(s): "VITAMINB12", "FOLATE", "FERRITIN", "TIBC", "IRON", "RETICCTPCT" in the last 72 hours. Sepsis  Labs: No results for input(s): "PROCALCITON", "LATICACIDVEN" in the last 168 hours.  No results found for this or any previous visit (from the past 240 hour(s)).       Radiology Studies: No results found.      Scheduled Meds:   stroke: early stages of recovery book   Does not apply Once   amLODipine  5 mg Oral Daily   apixaban  5 mg Oral BID   atorvastatin  40 mg Oral Daily   Chlorhexidine Gluconate Cloth  6 each Topical Daily   losartan  50 mg Oral Daily   ticagrelor  90 mg Oral BID     LOS: 7 days    Time spent: 35 minutes    Azucena Fallen, DO [facility]  If 7PM-7AM, please contact night-coverage [email or web address] 02/03/2022, 7:35 AM

## 2022-02-03 NOTE — TOC Benefit Eligibility Note (Signed)
Patient Research scientist (life sciences) completed.     The patient is currently admitted and upon discharge could be taking Brilinta.   The current 30 day co-pay is, $45.   The patient is insured through [employer].

## 2022-02-03 NOTE — Telephone Encounter (Signed)
Pharmacy Patient Advocate Encounter  Insurance verification completed.    The patient is insured through [employer]   The patient is currently admitted and ran test claims for the following: Brilinta.  Copays and coinsurance results were relayed to Inpatient clinical team.

## 2022-02-04 ENCOUNTER — Inpatient Hospital Stay (HOSPITAL_COMMUNITY)
Admission: RE | Admit: 2022-02-04 | Discharge: 2022-02-13 | DRG: 057 | Disposition: A | Discharge status: Home Health | Payer: Medicare HMO | Source: Intra-hospital | Attending: Physical Medicine & Rehabilitation | Admitting: Physical Medicine & Rehabilitation

## 2022-02-04 ENCOUNTER — Encounter (HOSPITAL_COMMUNITY): Payer: Self-pay | Admitting: Physical Medicine & Rehabilitation

## 2022-02-04 DIAGNOSIS — I6381 Other cerebral infarction due to occlusion or stenosis of small artery: Secondary | ICD-10-CM | POA: Diagnosis not present

## 2022-02-04 DIAGNOSIS — Z7901 Long term (current) use of anticoagulants: Secondary | ICD-10-CM

## 2022-02-04 DIAGNOSIS — M25561 Pain in right knee: Secondary | ICD-10-CM | POA: Diagnosis present

## 2022-02-04 DIAGNOSIS — I6522 Occlusion and stenosis of left carotid artery: Secondary | ICD-10-CM | POA: Diagnosis present

## 2022-02-04 DIAGNOSIS — K5901 Slow transit constipation: Secondary | ICD-10-CM

## 2022-02-04 DIAGNOSIS — G4700 Insomnia, unspecified: Secondary | ICD-10-CM | POA: Insufficient documentation

## 2022-02-04 DIAGNOSIS — I69351 Hemiplegia and hemiparesis following cerebral infarction affecting right dominant side: Secondary | ICD-10-CM | POA: Diagnosis not present

## 2022-02-04 DIAGNOSIS — Z885 Allergy status to narcotic agent status: Secondary | ICD-10-CM | POA: Diagnosis not present

## 2022-02-04 DIAGNOSIS — Z79899 Other long term (current) drug therapy: Secondary | ICD-10-CM | POA: Diagnosis not present

## 2022-02-04 DIAGNOSIS — I69392 Facial weakness following cerebral infarction: Secondary | ICD-10-CM

## 2022-02-04 DIAGNOSIS — R201 Hypoesthesia of skin: Secondary | ICD-10-CM | POA: Diagnosis present

## 2022-02-04 DIAGNOSIS — F40240 Claustrophobia: Secondary | ICD-10-CM | POA: Diagnosis present

## 2022-02-04 DIAGNOSIS — J302 Other seasonal allergic rhinitis: Secondary | ICD-10-CM | POA: Diagnosis not present

## 2022-02-04 DIAGNOSIS — M79641 Pain in right hand: Secondary | ICD-10-CM | POA: Diagnosis present

## 2022-02-04 DIAGNOSIS — I672 Cerebral atherosclerosis: Secondary | ICD-10-CM | POA: Diagnosis not present

## 2022-02-04 DIAGNOSIS — M792 Neuralgia and neuritis, unspecified: Secondary | ICD-10-CM | POA: Diagnosis not present

## 2022-02-04 DIAGNOSIS — R0981 Nasal congestion: Secondary | ICD-10-CM | POA: Diagnosis present

## 2022-02-04 DIAGNOSIS — Z7902 Long term (current) use of antithrombotics/antiplatelets: Secondary | ICD-10-CM | POA: Diagnosis not present

## 2022-02-04 DIAGNOSIS — F410 Panic disorder [episodic paroxysmal anxiety] without agoraphobia: Secondary | ICD-10-CM | POA: Diagnosis present

## 2022-02-04 DIAGNOSIS — E876 Hypokalemia: Secondary | ICD-10-CM | POA: Diagnosis not present

## 2022-02-04 DIAGNOSIS — I69393 Ataxia following cerebral infarction: Secondary | ICD-10-CM | POA: Diagnosis not present

## 2022-02-04 DIAGNOSIS — I480 Paroxysmal atrial fibrillation: Secondary | ICD-10-CM | POA: Diagnosis not present

## 2022-02-04 DIAGNOSIS — I10 Essential (primary) hypertension: Secondary | ICD-10-CM | POA: Diagnosis not present

## 2022-02-04 DIAGNOSIS — M25521 Pain in right elbow: Secondary | ICD-10-CM | POA: Diagnosis present

## 2022-02-04 DIAGNOSIS — F411 Generalized anxiety disorder: Secondary | ICD-10-CM

## 2022-02-04 DIAGNOSIS — H5509 Other forms of nystagmus: Secondary | ICD-10-CM | POA: Diagnosis present

## 2022-02-04 DIAGNOSIS — K5900 Constipation, unspecified: Secondary | ICD-10-CM | POA: Diagnosis not present

## 2022-02-04 DIAGNOSIS — I639 Cerebral infarction, unspecified: Secondary | ICD-10-CM | POA: Diagnosis not present

## 2022-02-04 HISTORY — DX: Paroxysmal atrial fibrillation: I48.0

## 2022-02-04 MED ORDER — TRIAMCINOLONE ACETONIDE 55 MCG/ACT NA AERO
1.0000 | INHALATION_SPRAY | Freq: Every day | NASAL | Status: DC
  Administered 2022-02-04 – 2022-02-13 (×10): 1 via NASAL
  Filled 2022-02-04: qty 10.8

## 2022-02-04 MED ORDER — TICAGRELOR 90 MG PO TABS: 90.0000 mg | ORAL_TABLET | Freq: Two times a day (BID) | ORAL | 1 refills | Status: DC

## 2022-02-04 MED ORDER — AMLODIPINE BESYLATE 5 MG PO TABS
5.0000 mg | ORAL_TABLET | Freq: Every day | ORAL | Status: DC
  Administered 2022-02-05 – 2022-02-13 (×9): 5 mg via ORAL
  Filled 2022-02-04 (×9): qty 1

## 2022-02-04 MED ORDER — ORAL CARE MOUTH RINSE: 15.0000 mL | OROMUCOSAL | Status: DC | PRN

## 2022-02-04 MED ORDER — ATORVASTATIN CALCIUM 40 MG PO TABS: 40.0000 mg | ORAL_TABLET | Freq: Every day | ORAL | 1 refills | Status: DC

## 2022-02-04 MED ORDER — AMLODIPINE BESYLATE 5 MG PO TABS: 5.0000 mg | ORAL_TABLET | Freq: Every day | ORAL | 1 refills | Status: DC

## 2022-02-04 MED ORDER — PROCHLORPERAZINE 25 MG RE SUPP: 12.5000 mg | Freq: Four times a day (QID) | RECTAL | Status: DC | PRN

## 2022-02-04 MED ORDER — DIPHENHYDRAMINE HCL 12.5 MG/5ML PO ELIX: 12.5000 mg | ORAL_SOLUTION | Freq: Four times a day (QID) | ORAL | Status: DC | PRN

## 2022-02-04 MED ORDER — ALUM & MAG HYDROXIDE-SIMETH 200-200-20 MG/5ML PO SUSP: 30.0000 mL | ORAL | Status: DC | PRN

## 2022-02-04 MED ORDER — BISACODYL 10 MG RE SUPP: 10.0000 mg | Freq: Every day | RECTAL | Status: DC | PRN

## 2022-02-04 MED ORDER — GUAIFENESIN-DM 100-10 MG/5ML PO SYRP: 5.0000 mL | ORAL_SOLUTION | Freq: Four times a day (QID) | ORAL | Status: DC | PRN

## 2022-02-04 MED ORDER — POLYETHYLENE GLYCOL 3350 17 G PO PACK: 17.0000 g | PACK | Freq: Every day | ORAL | Status: DC | PRN

## 2022-02-04 MED ORDER — SENNOSIDES-DOCUSATE SODIUM 8.6-50 MG PO TABS
1.0000 | ORAL_TABLET | Freq: Two times a day (BID) | ORAL | Status: DC
  Administered 2022-02-04 – 2022-02-13 (×14): 1 via ORAL
  Filled 2022-02-04 (×18): qty 1

## 2022-02-04 MED ORDER — PROCHLORPERAZINE MALEATE 5 MG PO TABS: 5.0000 mg | ORAL_TABLET | Freq: Four times a day (QID) | ORAL | Status: DC | PRN

## 2022-02-04 MED ORDER — LOSARTAN POTASSIUM 50 MG PO TABS
50.0000 mg | ORAL_TABLET | Freq: Every day | ORAL | Status: DC
  Administered 2022-02-05 – 2022-02-13 (×9): 50 mg via ORAL
  Filled 2022-02-04 (×9): qty 1

## 2022-02-04 MED ORDER — HYDRALAZINE HCL 10 MG PO TABS
10.0000 mg | ORAL_TABLET | Freq: Four times a day (QID) | ORAL | Status: DC | PRN
  Administered 2022-02-09 – 2022-02-13 (×2): 10 mg via ORAL
  Filled 2022-02-04 (×2): qty 1

## 2022-02-04 MED ORDER — ACETAMINOPHEN 325 MG PO TABS
325.0000 mg | ORAL_TABLET | ORAL | Status: DC | PRN
  Administered 2022-02-05 (×2): 650 mg via ORAL
  Administered 2022-02-06 (×2): 325 mg via ORAL
  Administered 2022-02-06: 500 mg via ORAL
  Administered 2022-02-06 – 2022-02-12 (×9): 650 mg via ORAL
  Filled 2022-02-04 (×14): qty 2

## 2022-02-04 MED ORDER — FLEET ENEMA 7-19 GM/118ML RE ENEM: 1.0000 | ENEMA | Freq: Once | RECTAL | Status: DC | PRN

## 2022-02-04 MED ORDER — ALPRAZOLAM 0.25 MG PO TABS
0.2500 mg | ORAL_TABLET | Freq: Two times a day (BID) | ORAL | Status: DC | PRN
  Administered 2022-02-09: 0.25 mg via ORAL
  Filled 2022-02-04: qty 1

## 2022-02-04 MED ORDER — APIXABAN 5 MG PO TABS
5.0000 mg | ORAL_TABLET | Freq: Two times a day (BID) | ORAL | Status: DC
  Administered 2022-02-04 – 2022-02-13 (×18): 5 mg via ORAL
  Filled 2022-02-04 (×18): qty 1

## 2022-02-04 MED ORDER — ATORVASTATIN CALCIUM 40 MG PO TABS
40.0000 mg | ORAL_TABLET | Freq: Every day | ORAL | Status: DC
  Administered 2022-02-05 – 2022-02-13 (×9): 40 mg via ORAL
  Filled 2022-02-04 (×9): qty 1

## 2022-02-04 MED ORDER — PROCHLORPERAZINE EDISYLATE 10 MG/2ML IJ SOLN: 5.0000 mg | Freq: Four times a day (QID) | INTRAMUSCULAR | Status: DC | PRN

## 2022-02-04 MED ORDER — TICAGRELOR 90 MG PO TABS
90.0000 mg | ORAL_TABLET | Freq: Two times a day (BID) | ORAL | Status: DC
  Administered 2022-02-04 – 2022-02-13 (×18): 90 mg via ORAL
  Filled 2022-02-04 (×18): qty 1

## 2022-02-04 MED ORDER — TRAZODONE HCL 50 MG PO TABS
25.0000 mg | ORAL_TABLET | Freq: Every evening | ORAL | Status: DC | PRN
  Administered 2022-02-04: 25 mg via ORAL
  Filled 2022-02-04: qty 1

## 2022-02-04 NOTE — Progress Notes (Signed)
Inpatient Rehabilitation Admissions Coordinator   I await insurance approval for possible Cir admit.  Danne Baxter, RN, MSN Rehab Admissions Coordinator [phone] 02/04/2022 10:30 AM

## 2022-02-04 NOTE — Progress Notes (Signed)
  Inpatient Rehabilitation Admissions Coordinator   I have insurance approval and can admit to CIR today. I contacted Dr Avon Gully and he will arrange discharge. Acute team and TOC made aware.  Danne Baxter, RN, MSN Rehab Admissions Coordinator [phone] 02/04/2022 1:30 PM

## 2022-02-04 NOTE — Progress Notes (Signed)
STROKE TEAM PROGRESS NOTE   INTERVAL HISTORY Patient is lying in bed comfortably.  The right-sided ataxia and weakness continue to improve  .  There is few beats of nystagmus in the right lateral gaze and saccadic dysmetria   Patient has insurance approval for transfer to inpatient rehab today.     Vitals:   02/03/22 2144 02/03/22 2356 02/04/22 0734 02/04/22 1131  BP: (!) 170/88 (!) 173/83 (!) 167/93 125/68  Pulse: 66 69 64 64  Resp:   17 18  Temp: 98.1 F (36.7 C) 98.6 F (37 C) 98.1 F (36.7 C) 98 F (36.7 C)  TempSrc: Oral Oral Oral Oral  SpO2: 96% 95% 97% 100%  Weight:      Height:       CBC:  Recent Labs  Lab 02/01/22 0011 02/03/22 0509  WBC 6.4 7.6  HGB 13.1 13.9  HCT 39.4 40.9  MCV 92.3 92.1  PLT 205 841   Basic Metabolic Panel:  Recent Labs  Lab 02/01/22 0011 02/03/22 0509  NA 141 141  K 3.4* 3.3*  CL 110 109  CO2 21* 23  GLUCOSE 118* 96  BUN 10 14  CREATININE 0.64 0.79  CALCIUM 8.7* 8.7*   Lipid Panel:  No results for input(s): "CHOL", "TRIG", "HDL", "CHOLHDL", "VLDL", "LDLCALC" in the last 168 hours. HgbA1c:  No results for input(s): "HGBA1C" in the last 168 hours.  IMAGING past 24 hours No results found.  PHYSICAL EXAM  Temp:  [98 F (36.7 C)-98.6 F (37 C)] 98 F (36.7 C) (11/07 1131) Pulse Rate:  [64-69] 64 (11/07 1131) Resp:  [17-18] 18 (11/07 1131) BP: (125-173)/(68-93) 125/68 (11/07 1131) SpO2:  [95 %-100 %] 100 % (11/07 1131)  General - Well nourished, well developed, in no apparent distress. Cardiovascular - Regular rhythm and rate, not in afib.  Mental Status -  Level of arousal and orientation to time, place, and person were intact. Language including expression, naming, repetition, comprehension was assessed and found intact. Fund of Knowledge was assessed and was intact.  Cranial Nerves II - XII - II - Visual field intact OU. III, IV, VI - Extraocular movements intact.  Few beats of nystagmus on right lateral gaze.   Saccadic dysmetria on right gaze V - Facial sensation decreased on the right VII - right facial VIII - Hearing & vestibular intact bilaterally. X - Palate elevates symmetrically. XI - Chin turning & shoulder shrug intact bilaterally. XII - Tongue protrusion intact.  Motor Strength - Left is 5/5, right arm and leg 4/5 with slight drift.  Bulk was normal and fasciculations were absent.   Motor Tone - Muscle tone was assessed at the neck and appendages and was normal.  Reflexes - The patient's reflexes were symmetrical in all extremities and she had no pathological reflexes.  Sensory - Light touch, temperature/pinprick were assessed and were decreased on the right UE and LE.   Coordination - The patient had normal movements in the left hand and foot with no ataxia or dysmetria, but mild dysmetria on the R FTN and ataxia on the R HTS.  Tremor was absent.  Gait and Station - deferred.   ASSESSMENT/PLAN Anita Fletcher is a 76 y.o. female presenting with right sided stuttering sensory deficits starting 3 days ago that then became persistent; the 3rd episode's symptoms have not remitted. She was found to have subtle right hemiparesis, and sensory ataxia causing gait difficulties when arrived at [facility].her exam is limited @ this time ,  however when she arrived @ [facility] her exam revealed right hemiataxia, subtle righ sided weakness and right sided hypoesthesia her symptoms were concerning for subcortical left hemispheric ischemic infarction obscured by severe small vessel disease on head CT Scan. MRI Brain w/o contrast (today) reveals small infarct in left thalamus/posterior limb of the internal capsule w/o hemorrhage or mass effect. Small remote infarcts and background w/ advanced chronic small vessel ischemic changes. She is now s/p cerebral angiogram showing a extreme tortuosity of major neck arteries ,Severe tortuosity of the proximal basilar artery resulting in 85% stenosis.  Ulcerated  plaque in the left carotid bulb.  No significant stenosis.  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 Large vessel disease from BA stenosis s/p elective basilar artery angioplasty and stenting for severe stenosis with postprocedure bilateral cerebellar infarcts CT no acute abnormality.   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   MRI showed left lateral thalamic infarct.   Cerebral angiogram today showed extreme tortuosity of major neck arteries, severe tortuosity of the proximal basilar artery resulting in 85% stenosis, Ulcerated plaque in the left carotid bulb.  No significant stenosis.  Bilateral fetal PCAs with severe atherosclerotic disease, greater on the left side where there is moderate stenosis at the P2 segment.  EF 70 to 75%.   LDL 140, TG 253 A1c 5.5.   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   Cerebral angiogram today showed extreme tortuosity of major neck arteries, severe tortuosity of the proximal basilar artery resulting in 85% stenosis. Pt agreed with BA stenting today with Dr. Sherlon Handing  Afib On eliqiis at home Current on hold for BA stent May consider to resume eliquis 24 h post stenting  Hypertension Stable On cleviprex drip wean as tolerated  On Norvasc 5mg   Long term BP goal normotensive  Hyperlipidemia Home meds:  none  LDL 140, goal < 70 Now on lipitor 40 Continue statin at discharge  Other Stroke Risk Factors Advanced age Hx of TIA  Other Active  Problems Hypokalemia K 3.4 replace. 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  Continue Mobilize out of bed.  Therapy consults.  Continue Brilinta and Eliquis.  Transfer to  inpatient rehab later today when bed available.  Discussed with patient and   Dr. .  Stroke team will sign off.  Kindly call for questions.  Follow-up as outpatient stroke clinic with nurse practitioner in 2 months.  Greater than 50% time during this 35-minute visit was spent on counseling and coordination of care about his stroke and answering questions and discussion with care team     Natale Milch, MD Medical Director [facility] Pager: [phone] 02/04/2022 1:54 PM   To contact Stroke Continuity provider, please refer to 13/09/2021. After hours, contact General Neurology

## 2022-02-04 NOTE — Discharge Summary (Signed)
Physician Discharge Summary  Anita Fletcher ION:[id number] DOB: 02-16-1946 DOA: 01/27/2022  PCP: Patient, No Pcp Per  Admit date: 01/27/2022 Discharge date: 02/04/2022  Admitted From: Home Disposition: Inpatient rehab  Recommendations for Outpatient Follow-up:  Follow up with PCP in 1-2 weeks Follow-up with neurology and surgical team as scheduled  Discharge Condition: Stable CODE STATUS: Full Diet recommendation: As tolerated  Brief/Interim Summary: 76 year old female with a history of paroxysmal atrial fibrillation on anticoagulation, hypertension, prior history of TIA, presents to the hospital with right arm, right face, right leg numbness.  CT head indicated possible CVA.  She is being admitted to [facility] for further evaluation by neurology and MRI brain. Anticipating transfer today and Neurology team aware.   Patient medically stable for discharge, awaiting further disposition recommendations, tentatively planned for discharge to CIR once bed is available and has insurance approval.  Discharge Diagnoses:  Principal Problem:   Acute CVA (cerebrovascular accident) [facility]) Active Problems:   Paroxysmal atrial fibrillation ([place])   HTN (hypertension)   Intracranial atherosclerosis  Acute CVA Recurrent episode 11/3 - Confirmed on MRI that required sedation 01-30-22 - Repeat CT head showing interval development of additional small acute left cerebellar infarcts without hemorrhage - Neurology/vascular following, appreciate insight recommendations -continue Brilinta, Eliquis - Likely secondary to below - Right sided numbness improving but not yet resolved - PT OT following, recommending inpatient rehab given ongoing instability and ambulatory dysfunction in the setting of paresthesias   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  - Basilar artery stent placement 01/31/2022 - tolerated well - Continue Eliquis and Brilinta - Aspirin discontinued per discussion with  neurology   Acute respiratory insufficiency with hypoxia post operatively, resolved - Initially on bipap - weaned to [facility] -currently on room air   Hypertension - Resume home medications in the next 24 hours   Paroxysmal atrial fibrillation - Currently in sinus rhythm - Reported compliance with Eliquis prior to admission - Previously on aspirin Plavix (now discontinued) - Transition to Eliquis/Brilinta as above   Profound Anxiety/Claustrophobia - Severe claustrophobia at baseline, has difficulty even riding in elevators - MRI completed with sedation, appreciate radiology and anesthesia's assistance  Discharge Instructions   Allergies as of 02/04/2022       Reactions   Codeine         Medication List     STOP taking these medications    metoprolol tartrate 25 MG tablet Commonly known as: LOPRESSOR       TAKE these medications    amLODipine 5 MG tablet Commonly known as: NORVASC Take 1 tablet (5 mg total) by mouth daily. Start taking on: February 05, 2022   apixaban 5 MG Tabs tablet Commonly known as: ELIQUIS Take 1 tablet (5 mg total) by mouth 2 (two) times daily.   atorvastatin 40 MG tablet Commonly known as: LIPITOR Take 1 tablet (40 mg total) by mouth daily. Start taking on: February 05, 2022   losartan 50 MG tablet Commonly known as: COZAAR Take 1 tablet (50 mg total) by mouth daily.   ticagrelor 90 MG Tabs tablet Commonly known as: BRILINTA Take 1 tablet (90 mg total) by mouth 2 (two) times daily.        Allergies  Allergen Reactions   Codeine     Consultations: Neurology, vascular surgery   Procedures/Studies: IR Intra Cran Stent  Result Date: 02/03/2022 INDICATION: [name] is a 76 y.o. female with PMH significant for HTN, atrial fibrillation, and TIA 10 years ago  being seen today for symptomatic intracranial stenosis. The patient states that beginning on Sunday, 10/29 she has been having intermittent right sided numbness in her  arms and legs which have lasted several minutes. The patient reports waking up the evening of 10/29 with numbness that has persisted until this time. She denies any muscle weakness, but endorses some difficulty with coordination, especially when she underwent a diagnostic cerebral angiogram on January 30, 2022 that showed severe stenosis of the proximal basilar artery. She returns to our service today for basilar artery angioplasty and stenting. EXAM: ULTRASOUND-GUIDED VASCULAR ACCESS DIAGNOSTIC CEREBRAL ANGIOGRAM INTRACRANIAL ANGIOPLASTY AND STENTING FLAT PANEL HEAD CT COMPARISON:  Cerebral angiogram January 30, 2022. MEDICATIONS: 5000 units of heparin IV. ANESTHESIA/SEDATION: The procedure was performed under general anesthesia. CONTRAST:  60 mL of Omnipaque 300 milligram/mL FLUOROSCOPY: Radiation Exposure Index (as provided by the fluoroscopic device): 126 seconds mGy Kerma COMPLICATIONS: None immediate. TECHNIQUE: Informed written consent was obtained from the patient after a thorough discussion of the procedural risks, benefits and alternatives. All questions were addressed. Maximal Sterile Barrier Technique was utilized including caps, mask, sterile gowns, sterile gloves, sterile drape, hand hygiene and skin antiseptic. A timeout was performed prior to the initiation of the procedure. Using the modified Seldinger technique and a micropuncture kit, access was gained to the distal left radial artery at the anatomical snuffbox and a 7 French sheath was placed. Real-time ultrasound guidance was utilized for vascular access including the acquisition of a permanent ultrasound image documenting patency of the accessed vessel. Slow intra arterial infusion of 5,000 IU heparin, 5 mg Verapamil and 200 mcg nitroglycerin diluted in patient's own blood was performed. No significant fluctuation in patient's blood pressure seen. Then, a left radial artery angiogram was obtained via sheath side port. Next, a 5 [place]  Berenstein 2 catheter was navigated over a 0.035" Terumo Glidewire into the left subclavian artery under fluoroscopic guidance. The catheter was then placed into the left vertebral artery. Frontal and lateral angiograms of the head and neck were obtained. FINDINGS: 1. Normal brachial artery branching pattern seen. No significant anatomical variation. The left radial artery caliber is adequate for vascular access. 2. Extreme tortuosity of the V1 segment of the left vertebral artery with moderate tortuosity at the V2 segment. 3. 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 PROCEDURE: The 5 [place] Berenstein 2 catheter was exchanged over the wire and under biplane roadmap guidance for a 7 French Arm[place]illo catheter which was placed into the mid V2 segment of the left vertebral artery. Magnified frontal and lateral angiograms of the head were obtained, centered on the posterior fossa. Coaxial navigation of a 2.5 x 20 mm synergy balloon mounted drug-eluting stent was performed into the proximal to mid basilar artery. Angioplasty was performed with stent deployment under fluoroscopy. Magnified frontal and lateral angiograms of the head showed adequate stent position with complete resolution of the basilar artery stenosis. Improved flow to the left posterior cerebral artery is also seen. Flat panel CT of the head was obtained and post processed in a separate workstation with concurrent attending physician supervision. Selected images were sent to PACS. No evidence of hemorrhagic complication seen. Delayed left vertebral artery angiograms with magnified frontal and lateral views of the head showed no evidence of thromboembolic complication. The catheter was subsequently withdrawn. An inflatable band was placed and inflated over the left hand access site. The vascular sheath was withdrawn and the band was slowly deflated until brisk flow was noted through the arteriotomy  site. At this point, the band was  reinflated with additional 3 cc of air to obtain patent hemostasis. IMPRESSION: Successful basilar artery angioplasty and stenting for treatment of symptomatic severe proximal basilar artery stenosis. PLAN: Patient will be transferred to ICU for observation. Electronically Signed   By: Pedro Earls M.D.   On: 02/03/2022 11:04   IR US Guide Vasc Access Left  Result Date: 02/03/2022 INDICATION: BRITTINEE RISK is a 76 y.o. female with PMH significant for HTN, atrial fibrillation, and TIA 10 years ago being seen today for symptomatic intracranial stenosis. The patient states that beginning on Sunday, 10/29 she has been having intermittent right sided numbness in her arms and legs which have lasted several minutes. The patient reports waking up the evening of 10/29 with numbness that has persisted until this time. She denies any muscle weakness, but endorses some difficulty with coordination, especially when she underwent a diagnostic cerebral angiogram on January 30, 2022 that showed severe stenosis of the proximal basilar artery. She returns to our service today for basilar artery angioplasty and stenting. EXAM: ULTRASOUND-GUIDED VASCULAR ACCESS DIAGNOSTIC CEREBRAL ANGIOGRAM INTRACRANIAL ANGIOPLASTY AND STENTING FLAT PANEL HEAD CT COMPARISON:  Cerebral angiogram January 30, 2022. MEDICATIONS: 5000 units of heparin IV. ANESTHESIA/SEDATION: The procedure was performed under general anesthesia. CONTRAST:  60 mL of Omnipaque 300 milligram/mL FLUOROSCOPY: Radiation Exposure Index (as provided by the fluoroscopic device): 126 seconds mGy Kerma COMPLICATIONS: None immediate. TECHNIQUE: Informed written consent was obtained from the patient after a thorough discussion of the procedural risks, benefits and alternatives. All questions were addressed. Maximal Sterile Barrier Technique was utilized including caps, mask, sterile gowns, sterile gloves, sterile drape, hand hygiene and skin antiseptic. A timeout  was performed prior to the initiation of the procedure. Using the modified Seldinger technique and a micropuncture kit, access was gained to the distal left radial artery at the anatomical snuffbox and a 7 French sheath was placed. Real-time ultrasound guidance was utilized for vascular access including the acquisition of a permanent ultrasound image documenting patency of the accessed vessel. Slow intra arterial infusion of 5,000 IU heparin, 5 mg Verapamil and 200 mcg nitroglycerin diluted in patient's own blood was performed. No significant fluctuation in patient's blood pressure seen. Then, a left radial artery angiogram was obtained via sheath side port. Next, a 5 [place] Berenstein 2 catheter was navigated over a 0.035" Terumo Glidewire into the left subclavian artery under fluoroscopic guidance. The catheter was then placed into the left vertebral artery. Frontal and lateral angiograms of the head and neck were obtained. FINDINGS: 1. Normal brachial artery branching pattern seen. No significant anatomical variation. The left radial artery caliber is adequate for vascular access. 2. Extreme tortuosity of the V1 segment of the left vertebral artery with moderate tortuosity at the V2 segment. 3. 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 PROCEDURE: The 5 [place] Berenstein 2 catheter was exchanged over the wire and under biplane roadmap guidance for a 7 French Armadillo catheter which was placed into the mid V2 segment of the left vertebral artery. Magnified frontal and lateral angiograms of the head were obtained, centered on the posterior fossa. Coaxial navigation of a 2.5 x 20 mm synergy balloon mounted drug-eluting stent was performed into the proximal to mid basilar artery. Angioplasty was performed with stent deployment under fluoroscopy. Magnified frontal and lateral angiograms of the head showed adequate stent position with complete resolution of the basilar artery stenosis. Improved  flow to the left posterior cerebral  artery is also seen. Flat panel CT of the head was obtained and post processed in a separate workstation with concurrent attending physician supervision. Selected images were sent to PACS. No evidence of hemorrhagic complication seen. Delayed left vertebral artery angiograms with magnified frontal and lateral views of the head showed no evidence of thromboembolic complication. The catheter was subsequently withdrawn. An inflatable band was placed and inflated over the left hand access site. The vascular sheath was withdrawn and the band was slowly deflated until brisk flow was noted through the arteriotomy site. At this point, the band was reinflated with additional 3 cc of air to obtain patent hemostasis. IMPRESSION: Successful basilar artery angioplasty and stenting for treatment of symptomatic severe proximal basilar artery stenosis. PLAN: Patient will be transferred to ICU for observation. Electronically Signed   By: Pedro Earls M.D.   On: 02/03/2022 11:04   IR Angiogram Follow Up Study  Result Date: 02/03/2022 INDICATION: DEVANNY PALECEK is a 76 y.o. female with PMH significant for HTN, atrial fibrillation, and TIA 10 years ago being seen today for symptomatic intracranial stenosis. The patient states that beginning on Sunday, 10/29 she has been having intermittent right sided numbness in her arms and legs which have lasted several minutes. The patient reports waking up the evening of 10/29 with numbness that has persisted until this time. She denies any muscle weakness, but endorses some difficulty with coordination, especially when she underwent a diagnostic cerebral angiogram on January 30, 2022 that showed severe stenosis of the proximal basilar artery. She returns to our service today for basilar artery angioplasty and stenting. EXAM: ULTRASOUND-GUIDED VASCULAR ACCESS DIAGNOSTIC CEREBRAL ANGIOGRAM INTRACRANIAL ANGIOPLASTY AND STENTING FLAT PANEL HEAD  CT COMPARISON:  Cerebral angiogram January 30, 2022. MEDICATIONS: 5000 units of heparin IV. ANESTHESIA/SEDATION: The procedure was performed under general anesthesia. CONTRAST:  60 mL of Omnipaque 300 milligram/mL FLUOROSCOPY: Radiation Exposure Index (as provided by the fluoroscopic device): 126 seconds mGy Kerma COMPLICATIONS: None immediate. TECHNIQUE: Informed written consent was obtained from the patient after a thorough discussion of the procedural risks, benefits and alternatives. All questions were addressed. Maximal Sterile Barrier Technique was utilized including caps, mask, sterile gowns, sterile gloves, sterile drape, hand hygiene and skin antiseptic. A timeout was performed prior to the initiation of the procedure. Using the modified Seldinger technique and a micropuncture kit, access was gained to the distal left radial artery at the anatomical snuffbox and a 7 French sheath was placed. Real-time ultrasound guidance was utilized for vascular access including the acquisition of a permanent ultrasound image documenting patency of the accessed vessel. Slow intra arterial infusion of 5,000 IU heparin, 5 mg Verapamil and 200 mcg nitroglycerin diluted in patient's own blood was performed. No significant fluctuation in patient's blood pressure seen. Then, a left radial artery angiogram was obtained via sheath side port. Next, a 5 [place] Berenstein 2 catheter was navigated over a 0.035" Terumo Glidewire into the left subclavian artery under fluoroscopic guidance. The catheter was then placed into the left vertebral artery. Frontal and lateral angiograms of the head and neck were obtained. FINDINGS: 1. Normal brachial artery branching pattern seen. No significant anatomical variation. The left radial artery caliber is adequate for vascular access. 2. Extreme tortuosity of the V1 segment of the left vertebral artery with moderate tortuosity at the V2 segment. 3. Again demonstrated severe stenosis of the proximal  basilar artery. PROCEDURE: The 5 [place] Berenstein 2 catheter was exchanged over the wire and under biplane roadmap guidance  for a 7 [place] Armadillo catheter which was placed into the mid V2 segment of the left vertebral artery. Magnified frontal and lateral angiograms of the head were obtained, centered on the posterior fossa. Coaxial navigation of a 2.5 x 20 mm synergy balloon mounted drug-eluting stent was performed into the proximal to mid basilar artery. Angioplasty was performed with stent deployment under fluoroscopy. Magnified frontal and lateral angiograms of the head showed adequate stent position with complete resolution of the basilar artery stenosis. Improved flow to the left posterior cerebral artery is also seen. Flat panel CT of the head was obtained and post processed in a separate workstation with concurrent attending physician supervision. Selected images were sent to PACS. No evidence of hemorrhagic complication seen. Delayed left vertebral artery angiograms with magnified frontal and lateral views of the head showed no evidence of thromboembolic complication. The catheter was subsequently withdrawn. An inflatable band was placed and inflated over the left hand access site. The vascular sheath was withdrawn and the band was slowly deflated until brisk flow was noted through the arteriotomy site. At this point, the band was reinflated with additional 3 cc of air to obtain patent hemostasis. IMPRESSION: Successful basilar artery angioplasty and stenting for treatment of symptomatic severe proximal basilar artery stenosis. PLAN: Patient will be transferred to ICU for observation. Electronically Signed   By: Pedro Earls M.D.   On: 02/03/2022 11:04   IR ANGIO VERTEBRAL SEL VERTEBRAL UNI L MOD SED  Result Date: 02/03/2022 INDICATION: GRACIA SAGGESE is a 76 y.o. female with PMH significant for HTN, atrial fibrillation, and TIA 10 years ago being seen today for symptomatic  intracranial stenosis. The patient states that beginning on Sunday, 10/29 she has been having intermittent right sided numbness in her arms and legs which have lasted several minutes. The patient reports waking up the evening of 10/29 with numbness that has persisted until this time. She denies any muscle weakness, but endorses some difficulty with coordination, especially when she underwent a diagnostic cerebral angiogram on January 30, 2022 that showed severe stenosis of the proximal basilar artery. She returns to our service today for basilar artery angioplasty and stenting. EXAM: ULTRASOUND-GUIDED VASCULAR ACCESS DIAGNOSTIC CEREBRAL ANGIOGRAM INTRACRANIAL ANGIOPLASTY AND STENTING FLAT PANEL HEAD CT COMPARISON:  Cerebral angiogram January 30, 2022. MEDICATIONS: 5000 units of heparin IV. ANESTHESIA/SEDATION: The procedure was performed under general anesthesia. CONTRAST:  60 mL of Omnipaque 300 milligram/mL FLUOROSCOPY: Radiation Exposure Index (as provided by the fluoroscopic device): 126 seconds mGy Kerma COMPLICATIONS: None immediate. TECHNIQUE: Informed written consent was obtained from the patient after a thorough discussion of the procedural risks, benefits and alternatives. All questions were addressed. Maximal Sterile Barrier Technique was utilized including caps, mask, sterile gowns, sterile gloves, sterile drape, hand hygiene and skin antiseptic. A timeout was performed prior to the initiation of the procedure. Using the modified Seldinger technique and a micropuncture kit, access was gained to the distal left radial artery at the anatomical snuffbox and a 7 French sheath was placed. Real-time ultrasound guidance was utilized for vascular access including the acquisition of a permanent ultrasound image documenting patency of the accessed vessel. Slow intra arterial infusion of 5,000 IU heparin, 5 mg Verapamil and 200 mcg nitroglycerin diluted in patient's own blood was performed. No significant  fluctuation in patient's blood pressure seen. Then, a left radial artery angiogram was obtained via sheath side port. Next, a 5 [place] Berenstein 2 catheter was navigated over a 0.035" Terumo Glidewire into the  left subclavian artery under fluoroscopic guidance. The catheter was then placed into the left vertebral artery. Frontal and lateral angiograms of the head and neck were obtained. FINDINGS: 1. Normal brachial artery branching pattern seen. No significant anatomical variation. The left radial artery caliber is adequate for vascular access. 2. Extreme tortuosity of the V1 segment of the left vertebral artery with moderate tortuosity at the V2 segment. 3. 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 PROCEDURE: The 5 [place] Berenstein 2 catheter was exchanged over the wire and under biplane roadmap guidance for a 7 French Armadillo catheter which was placed into the mid V2 segment of the left vertebral artery. Magnified frontal and lateral angiograms of the head were obtained, centered on the posterior fossa. Coaxial navigation of a 2.5 x 20 mm synergy balloon mounted drug-eluting stent was performed into the proximal to mid basilar artery. Angioplasty was performed with stent deployment under fluoroscopy. Magnified frontal and lateral angiograms of the head showed adequate stent position with complete resolution of the basilar artery stenosis. Improved flow to the left posterior cerebral artery is also seen. Flat panel CT of the head was obtained and post processed in a separate workstation with concurrent attending physician supervision. Selected images were sent to PACS. No evidence of hemorrhagic complication seen. Delayed left vertebral artery angiograms with magnified frontal and lateral views of the head showed no evidence of thromboembolic complication. The catheter was subsequently withdrawn. An inflatable band was placed and inflated over the left hand access site. The vascular  sheath was withdrawn and the band was slowly deflated until brisk flow was noted through the arteriotomy site. At this point, the band was reinflated with additional 3 cc of air to obtain patent hemostasis. IMPRESSION: Successful basilar artery angioplasty and stenting for treatment of symptomatic severe proximal basilar artery stenosis. PLAN: Patient will be transferred to ICU for observation. Electronically Signed   By: Pedro Earls M.D.   On: 02/03/2022 11:04   IR CT Head Ltd  Result Date: 02/03/2022 INDICATION: COURTNAY PETRILLA is a 76 y.o. female with PMH significant for HTN, atrial fibrillation, and TIA 10 years ago being seen today for symptomatic intracranial stenosis. The patient states that beginning on Sunday, 10/29 she has been having intermittent right sided numbness in her arms and legs which have lasted several minutes. The patient reports waking up the evening of 10/29 with numbness that has persisted until this time. She denies any muscle weakness, but endorses some difficulty with coordination, especially when she underwent a diagnostic cerebral angiogram on January 30, 2022 that showed severe stenosis of the proximal basilar artery. She returns to our service today for basilar artery angioplasty and stenting. EXAM: ULTRASOUND-GUIDED VASCULAR ACCESS DIAGNOSTIC CEREBRAL ANGIOGRAM INTRACRANIAL ANGIOPLASTY AND STENTING FLAT PANEL HEAD CT COMPARISON:  Cerebral angiogram January 30, 2022. MEDICATIONS: 5000 units of heparin IV. ANESTHESIA/SEDATION: The procedure was performed under general anesthesia. CONTRAST:  60 mL of Omnipaque 300 milligram/mL FLUOROSCOPY: Radiation Exposure Index (as provided by the fluoroscopic device): 126 seconds mGy Kerma COMPLICATIONS: None immediate. TECHNIQUE: Informed written consent was obtained from the patient after a thorough discussion of the procedural risks, benefits and alternatives. All questions were addressed. Maximal Sterile Barrier Technique  was utilized including caps, mask, sterile gowns, sterile gloves, sterile drape, hand hygiene and skin antiseptic. A timeout was performed prior to the initiation of the procedure. Using the modified Seldinger technique and a micropuncture kit, access was gained to the distal left radial artery at  the anatomical snuffbox and a 7 French sheath was placed. Real-time ultrasound guidance was utilized for vascular access including the acquisition of a permanent ultrasound image documenting patency of the accessed vessel. Slow intra arterial infusion of 5,000 IU heparin, 5 mg Verapamil and 200 mcg nitroglycerin diluted in patient's own blood was performed. No significant fluctuation in patient's blood pressure seen. Then, a left radial artery angiogram was obtained via sheath side port. Next, a 5 [place] Berenstein 2 catheter was navigated over a 0.035" Terumo Glidewire into the left subclavian artery under fluoroscopic guidance. The catheter was then placed into the left vertebral artery. Frontal and lateral angiograms of the head and neck were obtained. FINDINGS: 1. Normal brachial artery branching pattern seen. No significant anatomical variation. The left radial artery caliber is adequate for vascular access. 2. Extreme tortuosity of the V1 segment of the left vertebral artery with moderate tortuosity at the V2 segment. 3. 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 PROCEDURE: The 5 [place] Berenstein 2 catheter was exchanged over the wire and under biplane roadmap guidance for a 7 French [place] catheter which was placed into the mid V2 segment of the left vertebral artery. Magnified frontal and lateral angiograms of the head were obtained, centered on the posterior fossa. Coaxial navigation of a 2.5 x 20 mm synergy balloon mounted drug-eluting stent was performed into the proximal to mid basilar artery. Angioplasty was performed with stent deployment under fluoroscopy. Magnified frontal and  lateral angiograms of the head showed adequate stent position with complete resolution of the basilar artery stenosis. Improved flow to the left posterior cerebral artery is also seen. Flat panel CT of the head was obtained and post processed in a separate workstation with concurrent attending physician supervision. Selected images were sent to PACS. No evidence of hemorrhagic complication seen. Delayed left vertebral artery angiograms with magnified frontal and lateral views of the head showed no evidence of thromboembolic complication. The catheter was subsequently withdrawn. An inflatable band was placed and inflated over the left hand access site. The vascular sheath was withdrawn and the band was slowly deflated until brisk flow was noted through the arteriotomy site. At this point, the band was reinflated with additional 3 cc of air to obtain patent hemostasis. IMPRESSION: Successful basilar artery angioplasty and stenting for treatment of symptomatic severe proximal basilar artery stenosis. PLAN: Patient will be transferred to ICU for observation. Electronically Signed   By: Pedro Earls M.D.   On: 02/03/2022 11:04   CT HEAD WO CONTRAST (5MM)  Result Date: 01/31/2022 CLINICAL DATA:  Follow-up examination for stroke. EXAM: CT HEAD WITHOUT CONTRAST TECHNIQUE: Contiguous axial images were obtained from the base of the skull through the vertex without intravenous contrast. RADIATION DOSE REDUCTION: This exam was performed according to the departmental dose-optimization program which includes automated exposure control, adjustment of the mA and/or kV according to patient size and/or use of iterative reconstruction technique. COMPARISON:  Prior CT from 01/27/2022 and MRI from 01/30/2022. FINDINGS: Brain: Sequelae of interval stenting at the basilar artery. 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 No associated hemorrhage or mass effect. Now seen are 8 a few  additional small vaulting left cerebellar infarcts (series 3, images 8, 7). No other evidence for acute large vessel territory infarct. No acute intracranial hemorrhage. No mass lesion or midline shift. No hydrocephalus or extra-axial fluid collection. Underlying chronic microvascular ischemic disease noted. 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 Vascular: Interval stent placement  at the basilar artery. Calcified atherosclerosis about the skull base. No abnormal hyperdense vessel. Skull: Scalp soft tissues and calvarium demonstrate no new finding. Sinuses/Orbits: Globes orbital soft tissues demonstrate no acute finding. Paranasal sinuses are largely clear. Small chronic right mastoid effusion, similar to prior. Other: None. IMPRESSION: 1. Sequelae of interval stenting at the basilar artery. 2. 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 No associated hemorrhage or mass effect. 3. Interval development of a few additional small acute left cerebellar infarcts. No acute intracranial hemorrhage. 4. 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 Electronically Signed   By: Jeannine Boga M.D.   On: 01/31/2022 21:00   DG CHEST PORT 1 VIEW  Result Date: 01/31/2022 CLINICAL DATA:  CVA.  Numbness. EXAM: PORTABLE CHEST 1 VIEW COMPARISON:  08/24/2021 FINDINGS: Stable enlarged cardiac silhouette and large hiatal hernia. Tortuous and calcified thoracic aorta. Interval patchy density at the left lung base and minimal patchy density at the right lung base. Interval small amount of linear atelectasis in the right mid lung zone. Interval minimal right pleural effusion and possible small left pleural effusion. Diffuse osteopenia. IMPRESSION: 1. Interval patchy atelectasis or pneumonia at the left lung base and minimal patchy atelectasis or pneumonia at the right lung base. 2. Interval minimal right pleural effusion and possible small  left pleural effusion. 3. Stable cardiomegaly and large hiatal hernia. Electronically Signed   By: Claudie Revering M.D.   On: 01/31/2022 13:44   IR ANGIO INTRA EXTRACRAN SEL COM CAROTID INNOMINATE BILAT MOD SED  Result Date: 01/31/2022 INDICATION: LONETTE STEVISON is a 76 y.o. female with PMH significant for HTN, atrial fibrillation, and TIA 10 years ago being seen today for symptomatic intracranial stenosis. The patient states that beginning on Sunday, 10/29 she has been having intermittent right sided numbness in her arms and legs which have lasted several minutes. The patient reports waking up the evening of 10/29 with numbness that has persisted until this time. She denies any muscle weakness, but endorses some difficulty with coordination, especially when walking. She comes to our service today for a diagnostic angiogram. EXAM: ULTRASOUND-GUIDED VASCULAR ACCESS DIAGNOSTIC CEREBRAL ANGIOGRAM COMPARISON:  CT angiogram of the head and neck January 27, 2022. MEDICATIONS: 20 mg of hydralazine intravenous. ANESTHESIA/SEDATION: Moderate (conscious) sedation was employed during this procedure. A total of Versed 0.5 mg and Fentanyl 50 mcg was administered intravenously by the radiology nurse. Total intra-service moderate Sedation Time: 55 minutes. The patient's level of consciousness and vital signs were monitored continuously by radiology nursing throughout the procedure under my direct supervision. CONTRAST:  75 mL of Omnipaque 300 milligram/mL FLUOROSCOPY: Radiation Exposure Index (as provided by the fluoroscopic device): 5956 mGy Kerma COMPLICATIONS: None immediate. TECHNIQUE: Informed written consent was obtained from the patient after a thorough discussion of the procedural risks, benefits and alternatives. All questions were addressed. Maximal Sterile Barrier Technique was utilized including caps, mask, sterile gowns, sterile gloves, sterile drape, hand hygiene and skin antiseptic. A timeout was performed prior to  the initiation of the procedure. The right groin was prepped and draped in the usual sterile fashion. Using a micropuncture kit and the modified Seldinger technique, access was gained to the right common femoral artery and a 5 French sheath was placed. Real-time ultrasound guidance was utilized for vascular access including the acquisition of a permanent ultrasound image documenting patency of the accessed vessel. Under fluoroscopy, a 5 [place] Berenstein 2 catheter was navigated over a 0.035" Terumo  Glidewire into the aortic arch. The catheter was placed into the left subclavian artery. Frontal and lateral angiograms of the neck were obtained. Using road map guidance, the catheter was placed into the left vertebral artery. Frontal and lateral angiograms of the neck were obtained. The catheter was then placed into the right common carotid artery. Frontal and lateral angiograms of the neck were obtained followed by angiograms with frontal, lateral, magnified right anterior oblique and magnified lateral views of the head. Next, the catheter was placed into the left common carotid artery. Frontal and lateral angiograms of the neck were obtained followed by frontal and lateral angiograms of the head. The catheter was subsequently withdrawn. Right common femoral artery angiogram was obtained in right anterior oblique view. The puncture is at the level of the common femoral artery. The sheath was exchanged over the wire for a Perclose ProGlide which was utilized for access closure. Immediate hemostasis was achieved. FINDINGS: Left subclavian angiograms: Atherosclerotic changes of the left subclavian artery distal to the thyrocervical trunk origin with mild stenosis. Increased tortuosity of the left vertebral artery, severe at the V1 segment and mild at the V2 segment, without hemodynamically significant stenosis. Left vertebral artery angiograms: Atherosclerotic changes of the intracranial left vertebral artery with mild  stenosis. 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 The right vertebral artery V4 segment is seen via contrast reflux and shows mild atherosclerotic changes without hemodynamically significant stenosis. No opacification of the right ACA vascular tree related to origin directly from the right ICA (fetal PCA). Contrast opacification of the P1 segment of the left posterior cerebral artery is seen with minimal contrast opacification of the V2 segment due to washout from the left posterior communicating artery. No aneurysms or abnormally high-flow, early draining veins are seen. No regions of abnormal hypervascularity are noted. The visualized dural sinuses are patent. Right CCA angiograms: Cervical angiograms show extreme tortuosity of the right common carotid artery and proximal right internal carotid artery. There are no hemodynamically significant stenoses. Right CCA angiograms-cranial views: Images are degraded by motion. There is brisk vascular contrast filling of the right ACA and MCA vascular trees. There is also brisk contrast opacification of the right PCA vascular tree (fetal PCA). Contrast opacification of the left ACA vascular tree is also seen with rapid washout. There is luminal irregularity along the right carotid siphon and right MCA vascular tree consistent with mild atherosclerotic disease without high-grade stenosis. Prominent luminal irregularity is seen along right PCA vascular tree with multifocal areas of mild stenosis, more pronounced at the P2 segment. No aneurysms or abnormally high-flow, early draining veins are seen. The visualized dural sinuses are patent. The visualized branches of the right external carotid artery are unremarkable. Left CCA angiograms: Cervical angiograms show prominent tortuosity of the left common carotid artery and proximal left internal carotid artery. Atherosclerotic changes in the left carotid bulb with an  ulcerated plaque. There is no significant stenosis. Left CCA angiograms-cranial views: Images are degraded by motion. Luminal irregularity along the right carotid siphon, right ACA and right MCA vascular trees are noted, consistent with intracranial atherosclerotic disease. There is no significant stenosis in the intracranial right ICA. Multifocal areas of mild stenosis are noted along the left MCA vascular tree. Short segment of severe stenosis is seen in the left A1/ACA segment and moderate stenosis at the A3 segment. Severe atherosclerotic changes are noted in the left PCA vascular tree with moderate stenosis at the posterior communicating artery, severe  stenosis in the mid P2/PCA segment and multifocal areas of mild stenosis along the PCA vascular tree. No aneurysms or abnormally high-flow, early draining veins are seen. No regions of abnormal hypervascularity are noted. The visualized dural sinuses are patent. The visualized branches of the left external carotid artery are unremarkable. Right common femoral artery angiograms: The access is at the level of the mid right common femoral artery. The artery has mild atherosclerotic changes without significant stenosis, adequate for closure device. PROCEDURE: No intervention performed. IMPRESSION: 1. 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 2. Atherosclerotic changes of the anterior circulation with short segment of severe stenosis at the left A1/ACA segment and moderate stenosis at the left A3/ACA segment. 3. Extreme tortuosity of the major neck arteries, may be related to longstanding hypertension. 4. Ulcerated plaque in the left carotid bulb without stenosis. 5. No stenosis of the right carotid bifurcation. PLAN: Findings to be discussed with primary team for management decision. Electronically Signed   By: Pedro Earls M.D.   On: 01/31/2022 11:05   IR ANGIO VERTEBRAL SEL VERTEBRAL UNI L MOD SED  Result Date: 01/31/2022 INDICATION: KATNISS WEEDMAN is a 75 y.o. female with PMH significant for HTN, atrial fibrillation, and TIA 10 years ago being seen today for symptomatic intracranial stenosis. The patient states that beginning on Sunday, 10/29 she has been having intermittent right sided numbness in her arms and legs which have lasted several minutes. The patient reports waking up the evening of 10/29 with numbness that has persisted until this time. She denies any muscle weakness, but endorses some difficulty with coordination, especially when walking. She comes to our service today for a diagnostic angiogram. EXAM: ULTRASOUND-GUIDED VASCULAR ACCESS DIAGNOSTIC CEREBRAL ANGIOGRAM COMPARISON:  CT angiogram of the head and neck January 27, 2022. MEDICATIONS: 20 mg of hydralazine intravenous. ANESTHESIA/SEDATION: Moderate (conscious) sedation was employed during this procedure. A total of Versed 0.5 mg and Fentanyl 50 mcg was administered intravenously by the radiology nurse. Total intra-service moderate Sedation Time: 55 minutes. The patient's level of consciousness and vital signs were monitored continuously by radiology nursing throughout the procedure under my direct supervision. CONTRAST:  75 mL of Omnipaque 300 milligram/mL FLUOROSCOPY: Radiation Exposure Index (as provided by the fluoroscopic device): 5956 mGy Kerma COMPLICATIONS: None immediate. TECHNIQUE: Informed written consent was obtained from the patient after a thorough discussion of the procedural risks, benefits and alternatives. All questions were addressed. Maximal Sterile Barrier Technique was utilized including caps, mask, sterile gowns, sterile gloves, sterile drape, hand hygiene and skin antiseptic. A timeout was performed prior to the initiation of the procedure. The right groin was prepped and draped in the usual sterile fashion. Using a  micropuncture kit and the modified Seldinger technique, access was gained to the right common femoral artery and a 5 French sheath was placed. Real-time ultrasound guidance was utilized for vascular access including the acquisition of a permanent ultrasound image documenting patency of the accessed vessel. Under fluoroscopy, a 5 [place] Berenstein 2 catheter was navigated over a 0.035" Terumo Glidewire into the aortic arch. The catheter was placed into the left subclavian artery. Frontal and lateral angiograms of the neck were obtained. Using road map guidance, the catheter was placed into the left vertebral artery. Frontal and lateral angiograms of the neck were obtained. The catheter was then placed into the right common carotid artery. Frontal and lateral angiograms  of the neck were obtained followed by angiograms with frontal, lateral, magnified right anterior oblique and magnified lateral views of the head. Next, the catheter was placed into the left common carotid artery. Frontal and lateral angiograms of the neck were obtained followed by frontal and lateral angiograms of the head. The catheter was subsequently withdrawn. Right common femoral artery angiogram was obtained in right anterior oblique view. The puncture is at the level of the common femoral artery. The sheath was exchanged over the wire for a Perclose ProGlide which was utilized for access closure. Immediate hemostasis was achieved. FINDINGS: Left subclavian angiograms: Atherosclerotic changes of the left subclavian artery distal to the thyrocervical trunk origin with mild stenosis. Increased tortuosity of the left vertebral artery, severe at the V1 segment and mild at the V2 segment, without hemodynamically significant stenosis. Left vertebral artery angiograms: Atherosclerotic changes of the intracranial left vertebral artery with mild stenosis. 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 The  right vertebral artery V4 segment is seen via contrast reflux and shows mild atherosclerotic changes without hemodynamically significant stenosis. No opacification of the right ACA vascular tree related to origin directly from the right ICA (fetal PCA). Contrast opacification of the P1 segment of the left posterior cerebral artery is seen with minimal contrast opacification of the V2 segment due to washout from the left posterior communicating artery. No aneurysms or abnormally high-flow, early draining veins are seen. No regions of abnormal hypervascularity are noted. The visualized dural sinuses are patent. Right CCA angiograms: Cervical angiograms show extreme tortuosity of the right common carotid artery and proximal right internal carotid artery. There are no hemodynamically significant stenoses. Right CCA angiograms-cranial views: Images are degraded by motion. There is brisk vascular contrast filling of the right ACA and MCA vascular trees. There is also brisk contrast opacification of the right PCA vascular tree (fetal PCA). Contrast opacification of the left ACA vascular tree is also seen with rapid washout. There is luminal irregularity along the right carotid siphon and right MCA vascular tree consistent with mild atherosclerotic disease without high-grade stenosis. Prominent luminal irregularity is seen along right PCA vascular tree with multifocal areas of mild stenosis, more pronounced at the P2 segment. No aneurysms or abnormally high-flow, early draining veins are seen. The visualized dural sinuses are patent. The visualized branches of the right external carotid artery are unremarkable. Left CCA angiograms: Cervical angiograms show prominent tortuosity of the left common carotid artery and proximal left internal carotid artery. Atherosclerotic changes in the left carotid bulb with an ulcerated plaque. There is no significant stenosis. Left CCA angiograms-cranial views: Images are degraded by motion.  Luminal irregularity along the right carotid siphon, right ACA and right MCA vascular trees are noted, consistent with intracranial atherosclerotic disease. There is no significant stenosis in the intracranial right ICA. Multifocal areas of mild stenosis are noted along the left MCA vascular tree. Short segment of severe stenosis is seen in the left A1/ACA segment and moderate stenosis at the A3 segment. Severe atherosclerotic changes are noted in the left PCA vascular tree with moderate stenosis at the posterior communicating artery, severe stenosis in the mid P2/PCA segment and multifocal areas of mild stenosis along the PCA vascular tree. No aneurysms or abnormally high-flow, early draining veins are seen. No regions of abnormal hypervascularity are noted. The visualized dural sinuses are patent. The visualized branches of the left external carotid artery are unremarkable. Right common femoral artery angiograms: The access is at  the level of the mid right common femoral artery. The artery has mild atherosclerotic changes without significant stenosis, adequate for closure device. PROCEDURE: No intervention performed. IMPRESSION: 1. 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 2. Atherosclerotic changes of the anterior circulation with short segment of severe stenosis at the left A1/ACA segment and moderate stenosis at the left A3/ACA segment. 3. Extreme tortuosity of the major neck arteries, may be related to longstanding hypertension. 4. Ulcerated plaque in the left carotid bulb without stenosis. 5. No stenosis of the right carotid bifurcation. PLAN: Findings to be discussed with primary team for management decision. Electronically Signed   By: Pedro Earls M.D.   On: 01/31/2022 11:05   IR US Guide Vasc Access Right  Result Date: 01/31/2022 INDICATION: EMBERLEY KRAL  is a 76 y.o. female with PMH significant for HTN, atrial fibrillation, and TIA 10 years ago being seen today for symptomatic intracranial stenosis. The patient states that beginning on Sunday, 10/29 she has been having intermittent right sided numbness in her arms and legs which have lasted several minutes. The patient reports waking up the evening of 10/29 with numbness that has persisted until this time. She denies any muscle weakness, but endorses some difficulty with coordination, especially when walking. She comes to our service today for a diagnostic angiogram. EXAM: ULTRASOUND-GUIDED VASCULAR ACCESS DIAGNOSTIC CEREBRAL ANGIOGRAM COMPARISON:  CT angiogram of the head and neck January 27, 2022. MEDICATIONS: 20 mg of hydralazine intravenous. ANESTHESIA/SEDATION: Moderate (conscious) sedation was employed during this procedure. A total of Versed 0.5 mg and Fentanyl 50 mcg was administered intravenously by the radiology nurse. Total intra-service moderate Sedation Time: 55 minutes. The patient's level of consciousness and vital signs were monitored continuously by radiology nursing throughout the procedure under my direct supervision. CONTRAST:  75 mL of Omnipaque 300 milligram/mL FLUOROSCOPY: Radiation Exposure Index (as provided by the fluoroscopic device): 6195 mGy Kerma COMPLICATIONS: None immediate. TECHNIQUE: Informed written consent was obtained from the patient after a thorough discussion of the procedural risks, benefits and alternatives. All questions were addressed. Maximal Sterile Barrier Technique was utilized including caps, mask, sterile gowns, sterile gloves, sterile drape, hand hygiene and skin antiseptic. A timeout was performed prior to the initiation of the procedure. The right groin was prepped and draped in the usual sterile fashion. Using a micropuncture kit and the modified Seldinger technique, access was gained to the right common femoral artery and a 5 French sheath was placed. Real-time  ultrasound guidance was utilized for vascular access including the acquisition of a permanent ultrasound image documenting patency of the accessed vessel. Under fluoroscopy, a 5 [place] Berenstein 2 catheter was navigated over a 0.035" Terumo Glidewire into the aortic arch. The catheter was placed into the left subclavian artery. Frontal and lateral angiograms of the neck were obtained. Using road map guidance, the catheter was placed into the left vertebral artery. Frontal and lateral angiograms of the neck were obtained. The catheter was then placed into the right common carotid artery. Frontal and lateral angiograms of the neck were obtained followed by angiograms with frontal, lateral, magnified right anterior oblique and magnified lateral views of the head. Next, the catheter was placed into the left common carotid artery. Frontal and lateral angiograms of the neck were obtained followed by frontal and lateral angiograms of the head. The catheter was subsequently withdrawn. Right common femoral artery angiogram was obtained  in right anterior oblique view. The puncture is at the level of the common femoral artery. The sheath was exchanged over the wire for a Perclose ProGlide which was utilized for access closure. Immediate hemostasis was achieved. FINDINGS: Left subclavian angiograms: Atherosclerotic changes of the left subclavian artery distal to the thyrocervical trunk origin with mild stenosis. Increased tortuosity of the left vertebral artery, severe at the V1 segment and mild at the V2 segment, without hemodynamically significant stenosis. Left vertebral artery angiograms: Atherosclerotic changes of the intracranial left vertebral artery with mild stenosis. 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 The right vertebral artery V4 segment is seen via contrast reflux and shows mild atherosclerotic changes without hemodynamically significant stenosis. No  opacification of the right ACA vascular tree related to origin directly from the right ICA (fetal PCA). Contrast opacification of the P1 segment of the left posterior cerebral artery is seen with minimal contrast opacification of the V2 segment due to washout from the left posterior communicating artery. No aneurysms or abnormally high-flow, early draining veins are seen. No regions of abnormal hypervascularity are noted. The visualized dural sinuses are patent. Right CCA angiograms: Cervical angiograms show extreme tortuosity of the right common carotid artery and proximal right internal carotid artery. There are no hemodynamically significant stenoses. Right CCA angiograms-cranial views: Images are degraded by motion. There is brisk vascular contrast filling of the right ACA and MCA vascular trees. There is also brisk contrast opacification of the right PCA vascular tree (fetal PCA). Contrast opacification of the left ACA vascular tree is also seen with rapid washout. There is luminal irregularity along the right carotid siphon and right MCA vascular tree consistent with mild atherosclerotic disease without high-grade stenosis. Prominent luminal irregularity is seen along right PCA vascular tree with multifocal areas of mild stenosis, more pronounced at the P2 segment. No aneurysms or abnormally high-flow, early draining veins are seen. The visualized dural sinuses are patent. The visualized branches of the right external carotid artery are unremarkable. Left CCA angiograms: Cervical angiograms show prominent tortuosity of the left common carotid artery and proximal left internal carotid artery. Atherosclerotic changes in the left carotid bulb with an ulcerated plaque. There is no significant stenosis. Left CCA angiograms-cranial views: Images are degraded by motion. Luminal irregularity along the right carotid siphon, right ACA and right MCA vascular trees are noted, consistent with intracranial atherosclerotic  disease. There is no significant stenosis in the intracranial right ICA. Multifocal areas of mild stenosis are noted along the left MCA vascular tree. Short segment of severe stenosis is seen in the left A1/ACA segment and moderate stenosis at the A3 segment. Severe atherosclerotic changes are noted in the left PCA vascular tree with moderate stenosis at the posterior communicating artery, severe stenosis in the mid P2/PCA segment and multifocal areas of mild stenosis along the PCA vascular tree. No aneurysms or abnormally high-flow, early draining veins are seen. No regions of abnormal hypervascularity are noted. The visualized dural sinuses are patent. The visualized branches of the left external carotid artery are unremarkable. Right common femoral artery angiograms: The access is at the level of the mid right common femoral artery. The artery has mild atherosclerotic changes without significant stenosis, adequate for closure device. PROCEDURE: No intervention performed. IMPRESSION: 1. 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 2. Atherosclerotic changes of the anterior circulation with short segment of  severe stenosis at the left A1/ACA segment and moderate stenosis at the left A3/ACA segment. 3. Extreme tortuosity of the major neck arteries, may be related to longstanding hypertension. 4. Ulcerated plaque in the left carotid bulb without stenosis. 5. No stenosis of the right carotid bifurcation. PLAN: Findings to be discussed with primary team for management decision. Electronically Signed   By: Pedro Earls M.D.   On: 01/31/2022 11:05   MR BRAIN WO CONTRAST  Result Date: 01/30/2022 CLINICAL DATA:  Neuro deficit, stroke suspected. EXAM: MRI HEAD WITHOUT CONTRAST TECHNIQUE: Multiplanar, multiecho pulse sequences of the brain and surrounding structures were  obtained without intravenous contrast. COMPARISON:  Noncontrast head CT 01/27/2022 FINDINGS: Brain: There is a small acute infarct in the left thalamus/posterior limb of the internal capsule without hemorrhage or mass effect. There is no other evidence of acute infarct. Background parenchymal volume is normal. The ventricles are normal in size. There is T2/FLAIR hyperintensity in the bilateral lentiform nuclei, thalami, and right caudate head consistent with prior ischemia. Additional patchy and confluent FLAIR signal abnormality in the supratentorial brain likely reflects underlying advanced chronic small-vessel ischemic change. There are scattered punctate chronic microhemorrhages, favored hypertensive in etiology. There is no mass lesion.  There is no mass effect or midline shift. Vascular: Normal flow voids. Skull and upper cervical spine: Normal marrow signal. Sinuses/Orbits: The paranasal sinuses are clear. A right lens implant is noted. The globes and orbits are otherwise unremarkable. Other: There are bilateral mastoid effusions. IMPRESSION: 1. 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 2. Small remote infarcts and background advanced chronic small-vessel ischemic change as above. 3. Scattered punctate chronic microhemorrhages, favored hypertensive in etiology. These results will be called to the ordering clinician or representative by the Radiologist Assistant, and communication documented in the PACS or [employer]. Electronically Signed   By: Valetta Mole M.D.   On: 01/30/2022 14:16   ECHOCARDIOGRAM LIMITED  Result Date: 01/28/2022    ECHOCARDIOGRAM LIMITED REPORT   Patient Name:   AASHRITHA MIEDEMA Date of Exam: 01/28/2022 Medical Rec #:  [id number]     Height:       63.5 in Accession #:    [id number]    Weight:       168.0 lb Date of Birth:  [birth year]     BSA:          1.806 m Patient Age:    76 years      BP:            195/124 mmHg Patient Gender: F             HR:           67 bpm. Exam Location:  [facility] Procedure: Limited Echo Indications:     Stroke  History:         Patient has prior history of Echocardiogram examinations, most                  recent 10/04/2021. Stroke, Arrythmias:Atrial Fibrillation; Risk                  Factors:Hypertension.  Sonographer:     Wenda Low Referring Phys:  [place] Diagnosing Phys: Rozann Lesches MD IMPRESSIONS  1. Limited study.  2. Left ventricular ejection fraction, by estimation, is 70 to 75%. The left ventricle has hyperdynamic function. 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 Left ventricular diastolic function could not be evaluated.  3. The mitral valve is abnormal, mild to moderate annular calcification.  4. The aortic valve is tricuspid. Aortic valve sclerosis is present, with no evidence of aortic valve stenosis.  5. The inferior vena cava is normal in size with greater than 50% respiratory variability, suggesting right atrial pressure of 3 mmHg. FINDINGS  Left Ventricle: Left ventricular ejection fraction, by estimation, is 70 to 75%. The left ventricle has hyperdynamic function. The left ventricular internal cavity size was normal in size. There is severe asymmetric left ventricular hypertrophy of the basal segment. Left ventricular diastolic function could not be evaluated. Pericardium: There is no evidence of pericardial effusion. Presence of epicardial fat layer. Mitral Valve: The mitral valve is abnormal. Mild to moderate mitral annular calcification. Tricuspid Valve: The tricuspid valve is grossly normal. Aortic Valve: The aortic valve is tricuspid. There is mild aortic valve annular calcification. Aortic valve sclerosis is present, with no evidence of aortic valve stenosis. Aorta: The aortic root is normal in size and structure. Venous: The inferior vena cava is normal in size with greater than 50% respiratory  variability, suggesting right atrial pressure of 3 mmHg. IAS/Shunts: The interatrial septum was not assessed. LEFT VENTRICLE PLAX 2D LVIDd:         4.50 cm LVIDs:         2.50 cm LV PW:         1.50 cm LV IVS:        1.70 cm LVOT diam:     2.00 cm LVOT Area:     3.14 cm  LEFT ATRIUM         Index LA diam:    3.70 cm 2.05 cm/m   AORTA Ao Root diam: 2.90 cm Ao Asc diam:  3.60 cm  SHUNTS Systemic Diam: 2.00 cm Rozann Lesches MD Electronically signed by Rozann Lesches MD Signature Date/Time: 01/28/2022/12:19:22 PM    Final (Updated)    CT ANGIO HEAD NECK W WO CM  Result Date: 01/27/2022 CLINICAL DATA:  Stroke code right facial numbness/tingling EXAM: CT ANGIOGRAPHY HEAD AND NECK TECHNIQUE: Multidetector CT imaging of the head and neck was performed using the standard protocol during bolus administration of intravenous contrast. Multiplanar CT image reconstructions and MIPs were obtained to evaluate the vascular anatomy. Carotid stenosis measurements (when applicable) are obtained utilizing NASCET criteria, using the distal internal carotid diameter as the denominator. RADIATION DOSE REDUCTION: This exam was performed according to the departmental dose-optimization program which includes automated exposure control, adjustment of the mA and/or kV according to patient size and/or use of iterative reconstruction technique. CONTRAST:  15m OMNIPAQUE IOHEXOL 300 MG/ML  SOLN COMPARISON:  Same-day CT brain FINDINGS: CT HEAD FINDINGS Brain: See same day brain. 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 Vascular: See below Skull: Normal. Negative for fracture or focal lesion. Sinuses/Orbits: Bilateral lens replacements. Other: None Review of the MIP images confirms the above findings CTA NECK FINDINGS Aortic arch: Right carotid system: Right common carotid and cervical ICAs are markedly tortuous. There is mild narrowing of the cavernous segment of the right ICA. Left carotid system: Left  common carotid and cervical ICAs are markedly tortuous. There is mild narrowing of the left common carotid artery (series 6, image 229) secondary to soft atherosclerotic plaque. There is also mild narrowing of the origin of the left ICA secondary to soft atherosclerotic plaque. There is mild narrowing of the cavernous segment of  the left ICA. Vertebral arteries: There is mild-to-moderate narrowing of the origin of the right vertebral artery. There is moderate to severe narrowing in the distal V4 segment of the right vertebral artery (series 6, image 141). Skeleton: Multilevel degenerative changes with severe neural foraminal narrowing in the upper and mid cervical spine. Other neck: Negative. Upper chest: Negative. Review of the MIP images confirms the above findings CTA HEAD FINDINGS Anterior circulation: There is moderate focal stenosis at the M1 segment of the right ICA (series 7, image 92). There is mild-to-moderate narrowing of the A1 segment of the left ACA (series 6, image 103). 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 The P2 segment of the right PCA is predominantly supplied by a small caliber PCOM which likely demonstrates at least mild-to-moderate stenosis at the distal portion (series 6, image 109). There is severe stenosis in the P2 segment of the left PCA (series 100, image 112). Venous sinuses: As permitted by contrast timing, patent. 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 No evidence of large vessel occlusion. Electronically Signed   By: Marin Roberts M.D.   On: 01/27/2022 16:15   CT Head Wo  Contrast  Result Date: 01/27/2022 CLINICAL DATA:  Right facial numbness beginning yesterday EXAM: CT HEAD WITHOUT CONTRAST TECHNIQUE: Contiguous axial images were obtained from the base of the skull through the vertex without intravenous contrast. RADIATION DOSE REDUCTION: This exam was performed according to the departmental dose-optimization program which includes automated exposure control, adjustment of the mA and/or kV according to patient size and/or use of iterative reconstruction technique. COMPARISON:  CT head 07/05/2013 FINDINGS: Brain: There is no acute intracranial hemorrhage, extra-axial fluid collection, or acute large vessel territorial infarct. There is patchy hypodensity in the lentiform nuclei bilaterally, left more than right which could reflect acute or subacute infarct. Gray-white differentiation is otherwise preserved. Background parenchymal volume is normal. Gray-white differentiation is preserved. Ventricles are normal in size. Additional mild hypodensity in the supratentorial white matter likely reflects sequela of underlying chronic small vessel ischemic change There is no mass lesion.  There is no mass effect or midline shift. Vascular: There is calcification of the bilateral carotid siphons and vertebral arteries. Skull: Normal. Negative for fracture or focal lesion. Sinuses/Orbits: The imaged paranasal sinuses are clear. A right lens implant is noted. The globes and orbits are otherwise unremarkable. Other: None. IMPRESSION: 1. Patchy hypodensity in the lentiform nuclei bilaterally could reflect acute or subacute infarcts. Consider brain MRI for further evaluation. 2. No acute intracranial hemorrhage or large vessel territorial infarct. Electronically Signed   By: Valetta Mole M.D.   On: 01/27/2022 14:25     Subjective: No acute issues or events overnight   Discharge Exam: Vitals:   02/04/22 0734 02/04/22 1131  BP: (!) 167/93 125/68  Pulse: 64 64  Resp: 17 18  Temp: 98.1  F (36.7 C) 98 F (36.7 C)  SpO2: 97% 100%   Vitals:   02/03/22 2144 02/03/22 2356 02/04/22 0734 02/04/22 1131  BP: (!) 170/88 (!) 173/83 (!) 167/93 125/68  Pulse: 66 69 64 64  Resp:   17 18  Temp: 98.1 F (36.7 C)  98.6 F (37 C) 98.1 F (36.7 C) 98 F (36.7 C)  TempSrc: Oral Oral Oral Oral  SpO2: 96% 95% 97% 100%  Weight:      Height:        General: Pt is alert, awake, not in acute distress Cardiovascular: RRR, S1/S2 +, no rubs, no gallops Respiratory: CTA bilaterally, no wheezing, no rhonchi Abdominal: Soft, NT, ND, bowel sounds + Extremities: no edema, no cyanosis  The results of significant diagnostics from this hospitalization (including imaging, microbiology, ancillary and laboratory) are listed below for reference.     Microbiology: No results found for this or any previous visit (from the past 240 hour(s)).   Labs: BNP (last 3 results) No results for input(s): "BNP" in the last 8760 hours. Basic Metabolic Panel: Recent Labs  Lab 01/31/22 0320 01/31/22 1331 01/31/22 1549 02/01/22 0011 02/03/22 0509  NA 141 143 142 141 141  K 3.0* 3.5 3.5 3.4* 3.3*  CL 112*  --   --  110 109  CO2 22  --   --  21* 23  GLUCOSE 107*  --   --  118* 96  BUN 12  --   --  10 14  CREATININE 0.79  --   --  0.64 0.79  CALCIUM 8.3*  --   --  8.7* 8.7*   Liver Function Tests: No results for input(s): "AST", "ALT", "ALKPHOS", "BILITOT", "PROT", "ALBUMIN" in the last 168 hours. No results for input(s): "LIPASE", "AMYLASE" in the last 168 hours. No results for input(s): "AMMONIA" in the last 168 hours. CBC: Recent Labs  Lab 01/31/22 0320 01/31/22 1331 01/31/22 1549 02/01/22 0011 02/03/22 0509  WBC 7.9  --   --  6.4 7.6  HGB 14.0 13.6 13.6 13.1 13.9  HCT 40.8 40.0 40.0 39.4 40.9  MCV 92.1  --   --  92.3 92.1  PLT 214  --   --  205 236   Cardiac Enzymes: No results for input(s): "CKTOTAL", "CKMB", "CKMBINDEX", "TROPONINI" in the last 168 hours. BNP: Invalid input(s):  "POCBNP" CBG: Recent Labs  Lab 01/31/22 1827  GLUCAP 136*   D-Dimer No results for input(s): "DDIMER" in the last 72 hours. Hgb A1c No results for input(s): "HGBA1C" in the last 72 hours. Lipid Profile No results for input(s): "CHOL", "HDL", "LDLCALC", "TRIG", "CHOLHDL", "LDLDIRECT" in the last 72 hours. Thyroid function studies No results for input(s): "TSH", "T4TOTAL", "T3FREE", "THYROIDAB" in the last 72 hours.  Invalid input(s): "FREET3" Anemia work up No results for input(s): "VITAMINB12", "FOLATE", "FERRITIN", "TIBC", "IRON", "RETICCTPCT" in the last 72 hours. Urinalysis No results found for: "COLORURINE", "APPEARANCEUR", "LABSPEC", "PHURINE", "GLUCOSEU", "HGBUR", "BILIRUBINUR", "KETONESUR", "PROTEINUR", "UROBILINOGEN", "NITRITE", "LEUKOCYTESUR" Sepsis Labs Recent Labs  Lab 01/31/22 0320 02/01/22 0011 02/03/22 0509  WBC 7.9 6.4 7.6   Microbiology No results found for this or any previous visit (from the past 240 hour(s)).   Time coordinating discharge: Over 30 minutes  SIGNED:   Little Ishikawa, DO [facility] 02/04/2022, 1:37 PM Pager   If 7PM-7AM, please contact night-coverage [email or web address]

## 2022-02-04 NOTE — H&P (Signed)
Physical Medicine and Rehabilitation Admission H&P    CC: Stroke with functional deficits.    HPI: Anita Fletcher is a 76 year old  RH-female with history of HTN, TIA, PAF- on eliquis;  who was admitted on 01/27/22 with 3 day history of intermittent numbness right face, right hand and RLE. She was found to have elevated BP SBP 106 to 206 and HR in 50's. 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 MRI not done due to history of claustrophobia and panic attack She was transferred to [facility] on 10/21 for sedation prior to MRI and she was found to have right hemiataxia with right hypoesthesia and Jacksonian march type dysthesia of RLE followed by RUE followed by face. Dr. 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    MRA brain done revealing sever atherosclerotic disease with severe stenosis at proximal BA and severe stenosis with severe stenosis at left P2/PCA segment. MRI brain showed small acute infarcts left thalamus and posterior limb of internal capsule She underwent cerebral angiogram revealing extreme tortuosity of major neck arteries, 85% stenosis of proximal BA and ulcerated plaque in left carotid bulb. She underwent angioplasty with stenting of BA  with complete resolution of stenosis 11/03 by Dr. Nelida Gores Rogrigues Post procedure, patient found to have left facial droop with bidirectional horizontal nystagmus, decrease in left eye closure and RUE/RLE drift concerning for bran stem infarct.  She required BIPAP for acute respiratory insufficiency. CT head done revealing interval development of small acute left cerebellar infarcts and no bleed.    She was started on IV heparin as well as cleviprex for BP control.  Eliquis  resumed 11/05, ASA d/c and  cleviprex weaned off. Respiratory status improved and weaned off oxygen. Right sided weakness and ataxia noted to be improving but she continues to be limited by nystagmus, dysmetria with hemiplegia, sensory deficits on the left, ataxia with tendency to veer to the right as well as needing diet modification to D3 diet due to numbness right facial weakness with occasional oral residue post swallow. Therapy has been working with patient and CIR recommended due to functional decline.     Review of Systems  Constitutional:  Negative for chills and fever.  HENT:  Negative for hearing loss and tinnitus.   Eyes:  Negative for blurred vision.       Double vision has resolved  Respiratory:  Positive for cough. Negative for shortness of breath.   Cardiovascular:  Negative for chest pain and palpitations.  Gastrointestinal:  Negative for abdominal pain, constipation, heartburn and nausea.  Genitourinary:  Negative for urgency.  Musculoskeletal:  Positive for myalgias.  Neurological:  Positive for sensory change, speech change, focal weakness and headaches. Negative for dizziness.  Psychiatric/Behavioral:  The patient does not have insomnia.      Past Medical History:  Diagnosis Date   Acute CVA (cerebrovascular accident) ([place]) 01/27/2022   HTN (hypertension) 01/27/2022   PAF (paroxysmal atrial fibrillation) (HCC)    TIA (transient ischemic attack)     Past Surgical History:  Procedure Laterality Date   EYE SURGERY     IR ANGIO INTRA EXTRACRAN SEL COM CAROTID INNOMINATE BILAT MOD SED  01/30/2022   IR ANGIO VERTEBRAL SEL VERTEBRAL UNI L MOD  SED  01/30/2022   IR ANGIO VERTEBRAL SEL VERTEBRAL UNI L MOD SED  01/31/2022   IR ANGIOGRAM FOLLOW UP STUDY  01/31/2022   IR CT HEAD LTD  01/31/2022   IR INTRA CRAN STENT  01/31/2022   IR US GUIDE VASC ACCESS LEFT  01/31/2022   IR US GUIDE VASC ACCESS RIGHT  01/30/2022   RADIOLOGY WITH ANESTHESIA N/A 01/30/2022   Procedure: MRI WITH  ANESTHESIA - BRAIN W/O CONSTRAST;  Surgeon: Radiologist, Medication, MD;  Location: [place];  Service: Radiology;  Laterality: N/A;   RADIOLOGY WITH ANESTHESIA N/A 01/31/2022   Procedure: RADIOLOGY WITH ANESTHESIA;  Surgeon: Radiologist, Medication, MD;  Location: [place];  Service: Radiology;  Laterality: N/A;    Family History  Problem Relation Age of Onset   Alcoholism Father    Arrhythmia Brother    Diabetes Brother    Mental illness Brother     Social History:  Lives with boyfriend. 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 She  reports that she has never smoked. She has never used smokeless tobacco. She reports that she does not drink alcohol and does not use drugs.   Allergies  Allergen Reactions   Codeine     Medications Prior to Admission  Medication Sig Dispense Refill   [START ON 02/05/2022] amLODipine (NORVASC) 5 MG tablet Take 1 tablet (5 mg total) by mouth daily. 90 tablet 1   apixaban (ELIQUIS) 5 MG TABS tablet Take 1 tablet (5 mg total) by mouth 2 (two) times daily. 60 tablet 11   [START ON 02/05/2022] atorvastatin (LIPITOR) 40 MG tablet Take 1 tablet (40 mg total) by mouth daily. 90 tablet 1   losartan (COZAAR) 50 MG tablet Take 1 tablet (50 mg total) by mouth daily. 90 tablet 3   ticagrelor (BRILINTA) 90 MG TABS tablet Take 1 tablet (90 mg total) by mouth 2 (two) times daily. 180 tablet 1      Home: Home Living Family/patient expects to be discharged to:: Private residence Living Arrangements: Spouse/significant other Available Help at Discharge: Friend(s), Available 24 hours/day Type of Home: House Home Access: Stairs to enter [employer] of Steps: 2 to 3 Entrance Stairs-Rails: Can reach both Home Layout: Two level Alternate Level Stairs-Number of Steps: 10+1+1 Alternate Level Stairs-Rails: Left Bathroom Shower/Tub: Chiropodist: Standard Bathroom Accessibility: Yes Home Equipment: Conservation officer, nature (2  wheels), Grab bars - tub/shower Additional Comments: pt manages meds and finances, supervises significant other when he drives  Lives With: Significant other   Functional History: Prior Function Prior Level of Function : Independent/Modified Independent Mobility Comments: Community ambulator without AD; drives. 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, applications ADLs Comments: Independent  Functional Status:  Mobility: Bed Mobility Overal bed mobility: Needs Assistance Bed Mobility: Supine to Sit, Sit to Supine Supine to sit: Supervision, HOB elevated Sit to supine: Supervision, HOB elevated Transfers Overall transfer level: Needs assistance Equipment used: Rolling walker (2 wheels) Transfers: Sit to/from Stand Sit to Stand: Min assist General transfer comment: up to stand with cues for hand placement and assist for balance Ambulation/Gait Ambulation/Gait assistance: Mod assist, Min assist Gait Distance (Feet): 120 Feet (+15') Assistive device: Rolling walker (2 wheels), 1 person hand held assist Gait Pattern/deviations: Step-through pattern, Decreased stride length, Trunk flexed General Gait Details: incoordination on R with heavy footfall and increased velocity of movement with foot sliding on floor at times; cues throughout for forward gaze and to maintain self in center  of hall Gait velocity: reduced Gait velocity interpretation: <1.31 ft/sec, indicative of household ambulator    ADL: ADL Overall ADL's : Needs assistance/impaired Eating/Feeding: Set up, Sitting Eating/Feeding Details (indicate cue type and reason): in recliner Grooming: Minimal assistance, Sitting Grooming Details (indicate cue type and reason): in recliner Upper Body Bathing: Minimal assistance, Sitting Upper Body Bathing Details (indicate cue type and reason): in recliner Lower Body Bathing: Moderate assistance Lower Body Bathing Details (indicate cue type and reason): min A +2  sit<>stand Upper Body Dressing : Moderate assistance, Sitting Upper Body Dressing Details (indicate cue type and reason): recliner Lower Body Dressing: Moderate assistance Lower Body Dressing Details (indicate cue type and reason): min A +2 sit<>stand Toilet Transfer: Moderate assistance, +2 for physical assistance, Ambulation Toilet Transfer Details (indicate cue type and reason): Bil HHA; simulated bed>door>back to recliner Toileting- Clothing Manipulation and Hygiene: Total assistance Toileting - Clothing Manipulation Details (indicate cue type and reason): min A +2 sit<>stand General ADL Comments: Pt shows good standing balance and abiilty to reach down to B LE for ADL tasks.  Cognition: Cognition Overall Cognitive Status: Impaired/Different from baseline Orientation Level: Oriented X4 Cognition Arousal/Alertness: Awake/alert Behavior During Therapy: WFL for tasks assessed/performed Overall Cognitive Status: Impaired/Different from baseline Area of Impairment: Awareness, Safety/judgement Safety/Judgement: Decreased awareness of deficits Awareness: Emergent General Comments: patient veering to R in hallway needing instructional cues to avoid running into nursing station  Physical Exam: Blood pressure 125/68, pulse 64, temperature 98 F (36.7 C), temperature source Oral, resp. rate 18, height 5' 3.5" (1.613 m), weight 74.8 kg, SpO2 100 %. Physical Exam Vitals and nursing note reviewed.  Constitutional:      Appearance: Normal appearance.  HENT:     Head: Normocephalic and atraumatic.     Right Ear: External ear normal.     Left Ear: External ear normal.     Mouth/Throat:     Mouth: Mucous membranes are moist.     Pharynx: Oropharynx is clear.  Eyes:     General: No scleral icterus.    Extraocular Movements: Extraocular movements intact.     Conjunctiva/sclera: Conjunctivae normal.     Pupils: Pupils are equal, round, and reactive to light.  Cardiovascular:     Rate and  Rhythm: Normal rate and regular rhythm.     Heart sounds: No murmur heard.    No gallop.  Pulmonary:     Effort: Pulmonary effort is normal.     Breath sounds: Normal breath sounds.  Abdominal:     General: Bowel sounds are normal. There is no distension.     Palpations: Abdomen is soft.     Tenderness: There is no abdominal tenderness.  Musculoskeletal:        General: Tenderness (bilateral upper exts/elbows) present. Normal range of motion.     Cervical back: Normal range of motion and neck supple.  Skin:    General: Skin is warm and dry.     Findings: Bruising (both arms) present.  Neurological:     Mental Status: She is alert and oriented to person, place, and time.     Comments: Pt is alert and oriented. Fair insight and awareness. Functional memory. Right central 7. Sensation decreased right hemi-face. Right pronator drift. Decreased [place] RUE wth FTN as well as RLE with HTS. Sensation decreased to LT/proprioception RUE and RLE. Normal sensation left side. Motor nearly 5/5 in all 4 limbs. No abnl tone.      Psychiatric:     Comments: Pt a  little anxious but generally appropriate     Results for orders placed or performed during the hospital encounter of 01/27/22 (from the past 48 hour(s))  Basic metabolic panel     Status: Abnormal   Collection Time: 02/03/22  5:09 AM  Result Value Ref Range   Sodium 141 135 - 145 mmol/L   Potassium 3.3 (L) 3.5 - 5.1 mmol/L   Chloride 109 98 - 111 mmol/L   CO2 23 22 - 32 mmol/L   Glucose, Bld 96 70 - 99 mg/dL    Comment: Glucose reference range applies only to samples taken after fasting for at least 8 hours.   BUN 14 8 - 23 mg/dL   Creatinine, Ser 0.79 0.44 - 1.00 mg/dL   Calcium 8.7 (L) 8.9 - 10.3 mg/dL   GFR, Estimated >60 >60 mL/min    Comment: (NOTE) Calculated using the CKD-EPI Creatinine Equation (2021)    Anion gap 9 5 - 15    Comment: Performed at [facility] [address] [place] [address]  CBC      Status: None   Collection Time: 02/03/22  5:09 AM  Result Value Ref Range   WBC 7.6 4.0 - 10.5 K/uL   RBC 4.44 3.87 - 5.11 MIL/uL   Hemoglobin 13.9 12.0 - 15.0 g/dL   HCT 40.9 36.0 - 46.0 %   MCV 92.1 80.0 - 100.0 fL   MCH 31.3 26.0 - 34.0 pg   MCHC 34.0 30.0 - 36.0 g/dL   RDW 12.9 11.5 - 15.5 %   Platelets 236 150 - 400 K/uL   nRBC 0.0 0.0 - 0.2 %    Comment: Performed at [facility], [place] [address], [place], [place] [address]   No results found.    Blood pressure 125/68, pulse 64, temperature 98 F (36.7 C), temperature source Oral, resp. rate 18, height 5' 3.5" (1.613 m), weight 74.8 kg, SpO2 100 %.  Medical Problem List and Plan: 1. Functional deficits secondary to left posterior limb internal capsule, thalamic infarcts and subsequent left cerebellar infarcts  -patient may shower  -ELOS/Goals: 10-12 days, mod I to supervision with PT, OT, SLP 2.  Antithrombotics: -DVT/anticoagulation:  Pharmaceutical: Eliquis  -antiplatelet therapy: Brilinta 3. Pain Management: Tylenol prn.  4. Mood/Behavior/Sleep: LCSW to follow for evaluation and support.   -antipsychotic agents: N/A  -pt with chronic anxiety disorder   -xanax prn   -pt was appropriate on exam today 5. Neuropsych/cognition: This patient is capable of making decisions on her own behalf. 6. Skin/Wound Care: Routine pressure relief measures.  7. Fluids/Electrolytes/Nutrition: Monitor I/O. Check CMET in am 8. HTN: Monitor BP TID- continues to be labile.  --Continue Amlodipine and Cozaar.  9. PAF: Monitor HR TID--continue Eliquis 10. Intermittent hypokalemia: Recheck CMET in am.  11. Constipation: Continue Senna S bid.  12. Congestion: Will add nasocort to help with congestion/HA.        Bary Leriche, PA-C 02/04/2022

## 2022-02-04 NOTE — Progress Notes (Signed)
Inpatient Rehabilitation Admission Medication Review by a Pharmacist  A complete drug regimen review was completed for this patient to identify any potential clinically significant medication issues.  High Risk Drug Classes Is patient taking? Indication by Medication  Antipsychotic Yes Compazine - prn N/V  Anticoagulant Yes Apixaban - PAF  Antibiotic No   Opioid No   Antiplatelet Yes Brilinta - CVA stent   Hypoglycemics/insulin No   Vasoactive Medication Yes Amlodipine, losartan - BP  Chemotherapy No   Other Yes Trazodone - prn sleep Atorvastatin - HLD Alprazolam - prn anxiety     Type of Medication Issue Identified Description of Issue Recommendation(s)  Drug Interaction(s) (clinically significant)     Duplicate Therapy     Allergy     No Medication Administration End Date     Incorrect Dose     Additional Drug Therapy Needed     Significant med changes from prior encounter (inform family/care partners about these prior to discharge).    Other       Clinically significant medication issues were identified that warrant physician communication and completion of prescribed/recommended actions by midnight of the next day:  No  Pharmacist comments: None  Time spent performing this drug regimen review (minutes):  20 minutes  Thank you Anette Guarneri, PharmD

## 2022-02-04 NOTE — Progress Notes (Deleted)
PROGRESS NOTE    Anita Fletcher  IRJ:[id number] DOB: 1945-12-14 DOA: 01/27/2022 PCP: Patient, No Pcp Per   Brief Narrative:  76 year old female with a history of paroxysmal atrial fibrillation on anticoagulation, hypertension, prior history of TIA, presents to the hospital with right arm, right face, right leg numbness.  CT head indicated possible CVA.  She is being admitted to [facility] for further evaluation by neurology and MRI brain. Anticipating transfer today and Neurology team aware.  Patient medically stable for discharge, awaiting further disposition recommendations, tentatively planned for discharge to CIR once bed is available and has insurance approval.  Assessment & Plan:   Principal Problem:   Acute CVA (cerebrovascular accident) [facility]) Active Problems:   Paroxysmal atrial fibrillation ([place])   HTN (hypertension)   Intracranial atherosclerosis  Assessment and Plan:  Acute CVA Recurrent episode 11/3 -Confirmed on MRI that required sedation 01-30-22 -Repeat CT head showing interval development of additional small acute left cerebellar infarcts without hemorrhage -Neurology/vascular following, appreciate insight recommendations -Likely secondary to below -Right sided numbness improving but not yet resolved -PT OT following, recommending inpatient rehab given ongoing instability and ambulatory dysfunction in the setting of paresthesias   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  -Basilar artery stent placement 01/31/2022 - tolerated well -Continue Eliquis and Brilinta, aspirin discontinued per discussion with neurology  Acute respiratory insufficiency with hypoxia post operatively, resolved -Initially on bipap - weaned to [facility] -currently on room air -Ambulatory oxygen screening as indicated if symptomatic  Hypertension -Resume home medications in the next 24 hours   Paroxysmal atrial fibrillation -Currently in sinus rhythm -Reported compliance with  Eliquis prior to admission -Previously on aspirin Plavix -Transition to Eliquis/Brilinta as above   Profound Anxiety/Claustrophobia -Severe claustrophobia at baseline, has difficulty even riding in elevators -Xanax ordered as needed for anxiety -MRI completed with sedation, appreciate radiology and anesthesia's assistance   DVT prophylaxis: enoxaparin   Code Status: Full code Family Communication: Discussed with patient Disposition Plan: Inpatient rehab Remains inpatient appropriate because: Continued work-up in the setting of acute stroke   Consultants:  Neurology   Antimicrobials:   None   Subjective: No acute issues or events overnight denies nausea vomiting diarrhea constipation headache fevers chills or chest pain  Objective: Vitals:   02/03/22 1437 02/03/22 2043 02/03/22 2144 02/03/22 2356  BP: (!) 152/78  (!) 170/88 (!) 173/83  Pulse: 66  66 69  Resp: 18     Temp: 98.2 F (36.8 C)  98.1 F (36.7 C) 98.6 F (37 C)  TempSrc: Axillary  Oral Oral  SpO2: 99% 97% 96% 95%  Weight:      Height:        Intake/Output Summary (Last 24 hours) at 02/04/2022 0723 Last data filed at 02/03/2022 1700 Gross per 24 hour  Intake 540 ml  Output 150 ml  Net 390 ml    Filed Weights   01/27/22 1300 01/30/22 1121  Weight: 76.2 kg 74.8 kg    Examination:  General:  Pleasantly resting in bed, No acute distress. HEENT:  Normocephalic atraumatic.  Sclerae nonicteric, noninjected.  Extraocular movements intact bilaterally. Neck:  Without mass or deformity.  Trachea is midline. Lungs:  Clear to auscultate bilaterally without rhonchi, wheeze, or rales. Heart:  Regular rate and rhythm.  Without murmurs, rubs, or gallops. Abdomen:  Soft, nontender, nondistended.  Without guarding or rebound. Extremities: Without cyanosis, clubbing, edema, or obvious deformity. Vascular:  Dorsalis pedis and posterior tibial pulses palpable bilaterally. Skin:  Warm  and dry, no erythema, no  ulcerations.   Data Reviewed: I have personally reviewed following labs and imaging studies  CBC: Recent Labs  Lab 01/31/22 0320 01/31/22 1331 01/31/22 1549 02/01/22 0011 02/03/22 0509  WBC 7.9  --   --  6.4 7.6  HGB 14.0 13.6 13.6 13.1 13.9  HCT 40.8 40.0 40.0 39.4 40.9  MCV 92.1  --   --  92.3 92.1  PLT 214  --   --  205 236    Basic Metabolic Panel: Recent Labs  Lab 01/31/22 0320 01/31/22 1331 01/31/22 1549 02/01/22 0011 02/03/22 0509  NA 141 143 142 141 141  K 3.0* 3.5 3.5 3.4* 3.3*  CL 112*  --   --  110 109  CO2 22  --   --  21* 23  GLUCOSE 107*  --   --  118* 96  BUN 12  --   --  10 14  CREATININE 0.79  --   --  0.64 0.79  CALCIUM 8.3*  --   --  8.7* 8.7*    GFR: Estimated Creatinine Clearance: 60.5 mL/min (by C-G formula based on SCr of 0.79 mg/dL). Liver Function Tests: No results for input(s): "AST", "ALT", "ALKPHOS", "BILITOT", "PROT", "ALBUMIN" in the last 168 hours.  No results for input(s): "LIPASE", "AMYLASE" in the last 168 hours. No results for input(s): "AMMONIA" in the last 168 hours. Coagulation Profile: Recent Labs  Lab 01/30/22 0354  INR 1.1    Cardiac Enzymes: No results for input(s): "CKTOTAL", "CKMB", "CKMBINDEX", "TROPONINI" in the last 168 hours. BNP (last 3 results) No results for input(s): "PROBNP" in the last 8760 hours. HbA1C: No results for input(s): "HGBA1C" in the last 72 hours.  CBG: Recent Labs  Lab 01/31/22 1827  GLUCAP 136*    Lipid Profile: No results for input(s): "CHOL", "HDL", "LDLCALC", "TRIG", "CHOLHDL", "LDLDIRECT" in the last 72 hours.  Thyroid Function Tests: No results for input(s): "TSH", "T4TOTAL", "FREET4", "T3FREE", "THYROIDAB" in the last 72 hours. Anemia Panel: No results for input(s): "VITAMINB12", "FOLATE", "FERRITIN", "TIBC", "IRON", "RETICCTPCT" in the last 72 hours. Sepsis Labs: No results for input(s): "PROCALCITON", "LATICACIDVEN" in the last 168 hours.  No results found for this  or any previous visit (from the past 240 hour(s)).       Radiology Studies: No results found.      Scheduled Meds:   stroke: early stages of recovery book   Does not apply Once   amLODipine  5 mg Oral Daily   apixaban  5 mg Oral BID   atorvastatin  40 mg Oral Daily   losartan  50 mg Oral Daily   senna-docusate  1 tablet Oral BID   ticagrelor  90 mg Oral BID     LOS: 8 days    Time spent: 35 minutes    Azucena Fallen, DO [facility]  If 7PM-7AM, please contact night-coverage [email or web address] 02/04/2022, 7:23 AM

## 2022-02-04 NOTE — TOC Transition Note (Signed)
Transition of Care [facility]) - CM/SW Discharge Note   Patient Details  Name: Anita Fletcher MRN: [id number] Date of Birth: [birth year]  [facility] CM/SW Contact:  Pollie Friar, RN Phone Number: 02/04/2022, 1:28 PM   Clinical Narrative:    Pt discharging to CIR today. CM signing off.    Final next level of care: IP Rehab Facility Barriers to Discharge: No Barriers Identified   Patient Goals and CMS Choice        Discharge Placement                       Discharge Plan and Services                                     Social Determinants of Health (SDOH) Interventions     Readmission Risk Interventions     No data to display

## 2022-02-04 NOTE — Progress Notes (Signed)
Speech Language Pathology Treatment: Dysphagia  Patient Details Name: Anita Fletcher MRN: [id number] DOB: December 22, 1945 Today's Date: 02/04/2022 Time: 4665-9935 SLP Time Calculation (min) (ACUTE ONLY): 16 min  Assessment / Plan / Recommendation Clinical Impression  Pt says that her meals have been going well on the Dys 3 diet. She was finishing her breakfast meal upon SLP arrival with no evidence of pocketing or oral residue. She consumed small bites of pears and sips of thin liquids via straw with no overt signs of aspiration and again with minimal if any pocketing. Discussed diet options again and she prefers to stay on mechanical soft, in part because she feels like it would be too hard to cut her foods but also because she does not feel like she's ready to bite through larger pieces and/or harder foods. Will continue to follow and progress as able.   HPI HPI: 31 yof presented to [facility] ED with numbness to the right face, hand and lower extremity. Transferred to [facility] for further evaluation by neurology. MRI 11/2 showed small acute infarct in the left thalamus/posterior limb of the internal capsule without hemorrhage or mass effect. 11/2 cerebral angiogram showed severe basilar artery stenosis. 11/3 underwent basilar artery stent placement. S/P procedure presented with left upper and lower facial weakness, concerning for brainstem infarct.  Bedside swallow eval ordered.  PMHx TIA, HTN, afib.      SLP Plan  Continue with current plan of care      Recommendations for follow up therapy are one component of a multi-disciplinary discharge planning process, led by the attending physician.  Recommendations may be updated based on patient status, additional functional criteria and insurance authorization.    Recommendations  Diet recommendations: Dysphagia 3 (mechanical soft);Thin liquid Liquids provided via: Cup;Straw Medication Administration: Whole meds with puree Supervision: Patient able to self  feed;Intermittent supervision to cue for compensatory strategies Compensations: Slow rate;Small sips/bites;Lingual sweep for clearance of pocketing Postural Changes and/or Swallow Maneuvers: Seated upright 90 degrees                Oral Care Recommendations: Oral care BID Follow Up Recommendations: No SLP follow up Assistance recommended at discharge: None SLP Visit Diagnosis: Dysphagia, oral phase (R13.11) Plan: Continue with current plan of care           Osie Bond., M.A. [place] Office [phone]  Secure chat preferred   02/04/2022, 9:30 AM

## 2022-02-04 NOTE — Plan of Care (Signed)
  Problem: Education: Goal: Knowledge of disease or condition will improve Outcome: Progressing Goal: Knowledge of secondary prevention will improve (MUST DOCUMENT ALL) Outcome: Progressing Goal: Knowledge of patient specific risk factors will improve Anita Fletcher N/A or DELETE if not current risk factor) Outcome: Progressing   Problem: Ischemic Stroke/TIA Tissue Perfusion: Goal: Complications of ischemic stroke/TIA will be minimized Outcome: Progressing   Problem: Coping: Goal: Will verbalize positive feelings about self Outcome: Progressing Goal: Will identify appropriate support needs Outcome: Progressing   Problem: Health Behavior/Discharge Planning: Goal: Ability to manage health-related needs will improve Outcome: Progressing Goal: Goals will be collaboratively established with patient/family Outcome: Progressing   Problem: Self-Care: Goal: Ability to participate in self-care as condition permits will improve Outcome: Progressing Goal: Verbalization of feelings and concerns over difficulty with self-care will improve Outcome: Progressing Goal: Ability to communicate needs accurately will improve Outcome: Progressing   Problem: Nutrition: Goal: Risk of aspiration will decrease Outcome: Progressing Goal: Dietary intake will improve Outcome: Progressing   Problem: Education: Goal: Knowledge of General Education information will improve Description: Including pain rating scale, medication(s)/side effects and non-pharmacologic comfort measures Outcome: Progressing   Problem: Health Behavior/Discharge Planning: Goal: Ability to manage health-related needs will improve Outcome: Progressing   Problem: Clinical Measurements: Goal: Ability to maintain clinical measurements within normal limits will improve Outcome: Progressing Goal: Will remain free from infection Outcome: Progressing Goal: Diagnostic test results will improve Outcome: Progressing Goal: Respiratory  complications will improve Outcome: Progressing Goal: Cardiovascular complication will be avoided Outcome: Progressing   Problem: Activity: Goal: Risk for activity intolerance will decrease Outcome: Progressing   Problem: Nutrition: Goal: Adequate nutrition will be maintained Outcome: Progressing   Problem: Coping: Goal: Level of anxiety will decrease Outcome: Progressing   Problem: Elimination: Goal: Will not experience complications related to bowel motility Outcome: Progressing Goal: Will not experience complications related to urinary retention Outcome: Progressing   Problem: Pain Managment: Goal: General experience of comfort will improve Outcome: Progressing   Problem: Safety: Goal: Ability to remain free from injury will improve Outcome: Progressing   Problem: Skin Integrity: Goal: Risk for impaired skin integrity will decrease Outcome: Progressing   Problem: Education: Goal: Understanding of CV disease, CV risk reduction, and recovery process will improve Outcome: Progressing   Problem: Activity: Goal: Ability to return to baseline activity level will improve Outcome: Progressing   Problem: Cardiovascular: Goal: Ability to achieve and maintain adequate cardiovascular perfusion will improve Outcome: Progressing Goal: Vascular access site(s) Level 0-1 will be maintained Outcome: Progressing   Problem: Health Behavior/Discharge Planning: Goal: Ability to safely manage health-related needs after discharge will improve Outcome: Progressing

## 2022-02-04 NOTE — Progress Notes (Signed)
Pt request no visitors for her length of stay on IPR.  Anita Fletcher Anita Fletcher

## 2022-02-04 NOTE — H&P (Signed)
Expand All Collapse All      Physical Medicine and Rehabilitation Admission H&P     CC: Stroke with functional deficits.      HPI: Anita Fletcher is a 76 year old  RH-female with history of HTN, TIA, PAF- on eliquis;  who was admitted on 01/27/22 with 3 day history of intermittent numbness right face, right hand and RLE. She was found to have elevated BP SBP 106 to 206 and HR in 50's. 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 MRI not done due to history of claustrophobia and panic attack She was transferred to [facility] on 10/21 for sedation prior to MRI and she was found to have right hemiataxia with right hypoesthesia and Jacksonian march type dysthesia of RLE followed by RUE followed by face. Dr. 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     MRA brain done revealing sever atherosclerotic disease with severe stenosis at proximal BA and severe stenosis with severe stenosis at left P2/PCA segment. MRI brain showed small acute infarcts left thalamus and posterior limb of internal capsule She underwent cerebral angiogram revealing extreme tortuosity of major neck arteries, 85% stenosis of proximal BA and ulcerated plaque in left carotid bulb. She underwent angioplasty with stenting of BA  with complete resolution of stenosis 11/03 by Dr. Nelida Gores Rogrigues Post procedure, patient found to have left facial droop with bidirectional horizontal nystagmus, decrease in left eye closure and RUE/RLE drift concerning for bran stem infarct.  She required BIPAP for acute respiratory insufficiency. CT head done revealing interval development of small acute left cerebellar infarcts and no bleed.     She was started on IV heparin as well as  cleviprex for BP control.  Eliquis resumed 11/05, ASA d/c and  cleviprex weaned off. Respiratory status improved and weaned off oxygen. Right sided weakness and ataxia noted to be improving but she continues to be limited by nystagmus, dysmetria with hemiplegia, sensory deficits on the left, ataxia with tendency to veer to the right as well as needing diet modification to D3 diet due to numbness right facial weakness with occasional oral residue post swallow. Therapy has been working with patient and CIR recommended due to functional decline.       Review of Systems  Constitutional:  Negative for chills and fever.  HENT:  Negative for hearing loss and tinnitus.   Eyes:  Negative for blurred vision.       Double vision has resolved  Respiratory:  Positive for cough. Negative for shortness of breath.   Cardiovascular:  Negative for chest pain and palpitations.  Gastrointestinal:  Negative for abdominal pain, constipation, heartburn and nausea.  Genitourinary:  Negative for urgency.  Musculoskeletal:  Positive for myalgias.  Neurological:  Positive for sensory change, speech change, focal weakness and headaches. Negative for dizziness.  Psychiatric/Behavioral:  The patient does not have insomnia.             Past Medical History:  Diagnosis Date   Acute CVA (cerebrovascular accident) ([place]) 01/27/2022   HTN (hypertension) 01/27/2022   PAF (paroxysmal atrial fibrillation) ([facility])     TIA (transient ischemic attack)             Past Surgical History:  Procedure Laterality Date  EYE SURGERY       IR ANGIO INTRA EXTRACRAN SEL COM CAROTID INNOMINATE BILAT MOD SED   01/30/2022   IR ANGIO VERTEBRAL SEL VERTEBRAL UNI L MOD SED   01/30/2022   IR ANGIO VERTEBRAL SEL VERTEBRAL UNI L MOD SED   01/31/2022   IR ANGIOGRAM FOLLOW UP STUDY   01/31/2022   IR CT HEAD LTD   01/31/2022   IR INTRA CRAN STENT   01/31/2022   IR US GUIDE VASC ACCESS LEFT   01/31/2022   IR US GUIDE VASC ACCESS RIGHT   01/30/2022    RADIOLOGY WITH ANESTHESIA N/A 01/30/2022    Procedure: MRI WITH ANESTHESIA - BRAIN W/O CONSTRAST;  Surgeon: Radiologist, Medication, MD;  Location: [place];  Service: Radiology;  Laterality: N/A;   RADIOLOGY WITH ANESTHESIA N/A 01/31/2022    Procedure: RADIOLOGY WITH ANESTHESIA;  Surgeon: Radiologist, Medication, MD;  Location: [place];  Service: Radiology;  Laterality: N/A;           Family History  Problem Relation Age of Onset   Alcoholism Father     Arrhythmia Brother     Diabetes Brother     Mental illness Brother        Social History:  Lives with boyfriend. 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 She  reports that she has never smoked. She has never used smokeless tobacco. She reports that she does not drink alcohol and does not use drugs.         Allergies  Allergen Reactions   Codeine              Medications Prior to Admission  Medication Sig Dispense Refill   [START ON 02/05/2022] amLODipine (NORVASC) 5 MG tablet Take 1 tablet (5 mg total) by mouth daily. 90 tablet 1   apixaban (ELIQUIS) 5 MG TABS tablet Take 1 tablet (5 mg total) by mouth 2 (two) times daily. 60 tablet 11   [START ON 02/05/2022] atorvastatin (LIPITOR) 40 MG tablet Take 1 tablet (40 mg total) by mouth daily. 90 tablet 1   losartan (COZAAR) 50 MG tablet Take 1 tablet (50 mg total) by mouth daily. 90 tablet 3   ticagrelor (BRILINTA) 90 MG TABS tablet Take 1 tablet (90 mg total) by mouth 2 (two) times daily. 180 tablet 1          Home: Home Living Family/patient expects to be discharged to:: Private residence Living Arrangements: Spouse/significant other Available Help at Discharge: Friend(s), Available 24 hours/day Type of Home: House Home Access: Stairs to enter [employer] of Steps: 2 to 3 Entrance Stairs-Rails: Can reach both Home Layout: Two level Alternate Level Stairs-Number of Steps: 10+1+1 Alternate Level Stairs-Rails: Left Bathroom Shower/Tub:  Chiropodist: Standard Bathroom Accessibility: Yes Home Equipment: Conservation officer, nature (2 wheels), Grab bars - tub/shower Additional Comments: pt manages meds and finances, supervises significant other when he drives  Lives With: Significant other   Functional History: Prior Function Prior Level of Function : Independent/Modified Independent Mobility Comments: Community ambulator without AD; drives. 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, applications ADLs Comments: Independent   Functional Status:  Mobility: Bed Mobility Overal bed mobility: Needs Assistance Bed Mobility: Supine to Sit, Sit to Supine Supine to sit: Supervision, HOB elevated Sit to supine: Supervision, HOB elevated Transfers Overall transfer level: Needs assistance Equipment used: Rolling walker (2 wheels) Transfers: Sit to/from Stand Sit to Stand: Min assist General transfer comment: up to stand  with cues for hand placement and assist for balance Ambulation/Gait Ambulation/Gait assistance: Mod assist, Min assist Gait Distance (Feet): 120 Feet (+15') Assistive device: Rolling walker (2 wheels), 1 person hand held assist Gait Pattern/deviations: Step-through pattern, Decreased stride length, Trunk flexed General Gait Details: incoordination on R with heavy footfall and increased velocity of movement with foot sliding on floor at times; cues throughout for forward gaze and to maintain self in center of hall Gait velocity: reduced Gait velocity interpretation: <1.31 ft/sec, indicative of household ambulator   ADL: ADL Overall ADL's : Needs assistance/impaired Eating/Feeding: Set up, Sitting Eating/Feeding Details (indicate cue type and reason): in recliner Grooming: Minimal assistance, Sitting Grooming Details (indicate cue type and reason): in recliner Upper Body Bathing: Minimal assistance, Sitting Upper Body Bathing Details (indicate cue type and reason): in recliner Lower Body  Bathing: Moderate assistance Lower Body Bathing Details (indicate cue type and reason): min A +2 sit<>stand Upper Body Dressing : Moderate assistance, Sitting Upper Body Dressing Details (indicate cue type and reason): recliner Lower Body Dressing: Moderate assistance Lower Body Dressing Details (indicate cue type and reason): min A +2 sit<>stand Toilet Transfer: Moderate assistance, +2 for physical assistance, Ambulation Toilet Transfer Details (indicate cue type and reason): Bil HHA; simulated bed>door>back to recliner Toileting- Clothing Manipulation and Hygiene: Total assistance Toileting - Clothing Manipulation Details (indicate cue type and reason): min A +2 sit<>stand General ADL Comments: Pt shows good standing balance and abiilty to reach down to B LE for ADL tasks.   Cognition: Cognition Overall Cognitive Status: Impaired/Different from baseline Orientation Level: Oriented X4 Cognition Arousal/Alertness: Awake/alert Behavior During Therapy: WFL for tasks assessed/performed Overall Cognitive Status: Impaired/Different from baseline Area of Impairment: Awareness, Safety/judgement Safety/Judgement: Decreased awareness of deficits Awareness: Emergent General Comments: patient veering to R in hallway needing instructional cues to avoid running into nursing station   Physical Exam: Blood pressure 125/68, pulse 64, temperature 98 F (36.7 C), temperature source Oral, resp. rate 18, height 5' 3.5" (1.613 m), weight 74.8 kg, SpO2 100 %. Physical Exam Vitals and nursing note reviewed.  Constitutional:      Appearance: Normal appearance.  HENT:     Head: Normocephalic and atraumatic.     Right Ear: External ear normal.     Left Ear: External ear normal.     Mouth/Throat:     Mouth: Mucous membranes are moist.     Pharynx: Oropharynx is clear.  Eyes:     General: No scleral icterus.    Extraocular Movements: Extraocular movements intact.     Conjunctiva/sclera: Conjunctivae  normal.     Pupils: Pupils are equal, round, and reactive to light.  Cardiovascular:     Rate and Rhythm: Normal rate and regular rhythm.     Heart sounds: No murmur heard.    No gallop.  Pulmonary:     Effort: Pulmonary effort is normal.     Breath sounds: Normal breath sounds.  Abdominal:     General: Bowel sounds are normal. There is no distension.     Palpations: Abdomen is soft.     Tenderness: There is no abdominal tenderness.  Musculoskeletal:        General: Tenderness (bilateral upper exts/elbows) present. Normal range of motion.     Cervical back: Normal range of motion and neck supple.  Skin:    General: Skin is warm and dry.     Findings: Bruising (both arms) present.  Neurological:     Mental Status: She is alert and oriented to  person, place, and time.     Comments: Pt is alert and oriented. Fair insight and awareness. Functional memory. Right central 7. Sensation decreased right hemi-face. Right pronator drift. Decreased Heber RUE wth FTN as well as RLE with HTS. Sensation decreased to LT/proprioception RUE and RLE. Normal sensation left side. Motor nearly 5/5 in all 4 limbs. No abnl tone.        Psychiatric:     Comments: Pt a little anxious but generally appropriate        Lab Results Last 48 Hours        Results for orders placed or performed during the hospital encounter of 01/27/22 (from the past 48 hour(s))  Basic metabolic panel     Status: Abnormal    Collection Time: 02/03/22  5:09 AM  Result Value Ref Range    Sodium 141 135 - 145 mmol/L    Potassium 3.3 (L) 3.5 - 5.1 mmol/L    Chloride 109 98 - 111 mmol/L    CO2 23 22 - 32 mmol/L    Glucose, Bld 96 70 - 99 mg/dL      Comment: Glucose reference range applies only to samples taken after fasting for at least 8 hours.    BUN 14 8 - 23 mg/dL    Creatinine, Ser 0.79 0.44 - 1.00 mg/dL    Calcium 8.7 (L) 8.9 - 10.3 mg/dL    GFR, Estimated >60 >60 mL/min      Comment: (NOTE) Calculated using the CKD-EPI  Creatinine Equation (2021)      Anion gap 9 5 - 15      Comment: Performed at [facility] [address], [place] [address]  CBC     Status: None    Collection Time: 02/03/22  5:09 AM  Result Value Ref Range    WBC 7.6 4.0 - 10.5 K/uL    RBC 4.44 3.87 - 5.11 MIL/uL    Hemoglobin 13.9 12.0 - 15.0 g/dL    HCT 40.9 36.0 - 46.0 %    MCV 92.1 80.0 - 100.0 fL    MCH 31.3 26.0 - 34.0 pg    MCHC 34.0 30.0 - 36.0 g/dL    RDW 12.9 11.5 - 15.5 %    Platelets 236 150 - 400 K/uL    nRBC 0.0 0.0 - 0.2 %      Comment: Performed at [facility], [place] [address] [place], [place] [address]      Imaging Results (Last 48 hours)  No results found.         Blood pressure 125/68, pulse 64, temperature 98 F (36.7 C), temperature source Oral, resp. rate 18, height 5' 3.5" (1.613 m), weight 74.8 kg, SpO2 100 %.   Medical Problem List and Plan: 1. 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  Antithrombotics: -DVT/anticoagulation:  Pharmaceutical: Eliquis             -antiplatelet therapy: Brilinta 3. Pain Management: Tylenol prn.  4. Mood/Behavior/Sleep: LCSW to follow for evaluation and support.              -antipsychotic agents: N/A             -pt  with chronic anxiety disorder                         -xanax prn                         -pt was appropriate on exam today 5. Neuropsych/cognition: This patient is capable of making decisions on her own behalf. 6. Skin/Wound Care: Routine pressure relief measures.  7. Fluids/Electrolytes/Nutrition: Monitor I/O. Check CMET in am 8. HTN: Monitor BP TID- continues to be labile.  --Continue Amlodipine and Cozaar.  9. PAF: Monitor HR TID--continue Eliquis 10. Intermittent hypokalemia: Recheck CMET in am.  11. Constipation: Continue Senna  S bid.  12. Congestion: Will add nasocort to help with congestion/HA.        Bary Leriche, PA-C 02/04/2022  I have personally performed a face to face diagnostic evaluation of this patient and formulated the key components of the plan.  Additionally, I have personally reviewed laboratory data, imaging studies, as well as relevant notes and concur with the physician assistant's documentation above.  The patient's status has not changed from the original H&P.  Any changes in documentation from the acute care chart have been noted above.  Meredith Staggers, MD, [facility]

## 2022-02-04 NOTE — Progress Notes (Signed)
Physical Therapy Treatment Patient Details Name: Anita Fletcher MRN: [id number] DOB: [birth year] Today's Date: 02/04/2022   History of Present Illness 76 y.o. female presents to [facility] hospital on 01/29/2022 as a transfer from outside hospital with R side numbness. CT head indicating possible CVA. 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 Pt underwent diagnostic angiogram on 11/2 and angioplasty on 11/3 after recurrent symptoms and CT showing L cerebellar infarcts. PMH includes afib, HTN.    PT Comments    Pt with continued progress towards acute goals with session focused on safe transfers and improved gait mechanics and environmental awareness. Pt continues to need cues for right attention and to maintain center in hall during gait with RW as pt with tendency to drift to right. Pt with small LOB x2 with pt able to self correct in all instances with min guard. Pt with continued ataxic movements of R foot with some slapping and forceful foot strike with pt stating decreased sensation. Current plan remains appropriate to address deficits and maximize functional independence and decrease caregiver burden. Pt continues to benefit from skilled PT services to progress toward functional mobility goals.    Recommendations for follow up therapy are one component of a multi-disciplinary discharge planning process, led by the attending physician.  Recommendations may be updated based on patient status, additional functional criteria and insurance authorization.  Follow Up Recommendations  Acute inpatient rehab (3hours/day)     Assistance Recommended at Discharge Intermittent Supervision/Assistance  Patient can return home with the following A lot of help with walking and/or transfers;A lot of help with bathing/dressing/bathroom;Assistance with cooking/housework;Assist for transportation;Help with stairs or ramp for entrance   Equipment Recommendations  Other (comment) (TBA)    Recommendations  for Other Services       Precautions / Restrictions Precautions Precautions: Fall Restrictions Weight Bearing Restrictions: No     Mobility  Bed Mobility Overal bed mobility: Needs Assistance Bed Mobility: Supine to Sit, Sit to Supine     Supine to sit: Supervision, HOB elevated Sit to supine: Supervision, HOB elevated        Transfers Overall transfer level: Needs assistance Equipment used: Rolling walker (2 wheels) Transfers: Sit to/from Stand Sit to Stand: Min assist           General transfer comment: up to stand with cues for hand placement and assist for balance    Ambulation/Gait Ambulation/Gait assistance: Mod assist, Min assist Gait Distance (Feet): 120 Feet (+15') Assistive device: Rolling walker (2 wheels), 1 person hand held assist Gait Pattern/deviations: Step-through pattern, Decreased stride length, Trunk flexed Gait velocity: reduced     General Gait Details: incoordination on R with heavy footfall and increased velocity of movement with foot sliding on floor at times; cues throughout for forward gaze and to maintain self in center of hall   Stairs             Wheelchair Mobility    Modified Rankin (Stroke Patients Only) Modified Rankin (Stroke Patients Only) Pre-Morbid Rankin Score: No symptoms Modified Rankin: Moderately severe disability     Balance Overall balance assessment: Needs assistance Sitting-balance support: Feet supported Sitting balance-Leahy Scale: Fair     Standing balance support: Single extremity supported, Bilateral upper extremity supported, Reliant on assistive device for balance Standing balance-Leahy Scale: Poor                              Cognition Arousal/Alertness: Awake/alert  Behavior During Therapy: WFL for tasks assessed/performed Overall Cognitive Status: Impaired/Different from baseline Area of Impairment: Awareness, Safety/judgement                          Safety/Judgement: Decreased awareness of deficits Awareness: Emergent   General Comments: patient veering to R in hallway needing instructional cues to avoid running into nursing station        Exercises      General Comments        Pertinent Vitals/Pain Pain Assessment Pain Assessment: No/denies pain    Home Living                          Prior Function            PT Goals (current goals can now be found in the care plan section) Acute Rehab PT Goals Patient Stated Goal: get home to put up christmas trees PT Goal Formulation: With patient Time For Goal Achievement: 02/16/22    Frequency    Min 4X/week      PT Plan Current plan remains appropriate    Co-evaluation              AM-PAC PT "6 Clicks" Mobility   Outcome Measure  Help needed turning from your back to your side while in a flat bed without using bedrails?: A Little Help needed moving from lying on your back to sitting on the side of a flat bed without using bedrails?: A Little Help needed moving to and from a bed to a chair (including a wheelchair)?: A Little Help needed standing up from a chair using your arms (e.g., wheelchair or bedside chair)?: A Little Help needed to walk in hospital room?: A Little Help needed climbing 3-5 steps with a railing? : A Lot 6 Click Score: 17    End of Session Equipment Utilized During Treatment: Gait belt Activity Tolerance: Patient tolerated treatment well Patient left: in bed;with call bell/phone within reach;with bed alarm set Nurse Communication: Mobility status PT Visit Diagnosis: Other symptoms and signs involving the nervous system (R29.898);Other abnormalities of gait and mobility (R26.89)     Time: 4492-0100 PT Time Calculation (min) (ACUTE ONLY): 14 min  Charges:  $Gait Training: 8-22 mins                     Kyung Muto R. PTA Acute Rehabilitation Services Office: [phone]    Catalina Antigua 02/04/2022, 2:40 PM

## 2022-02-04 NOTE — Progress Notes (Signed)
Report called to 4W RN - room ready and pt stable to transfer.

## 2022-02-04 NOTE — Progress Notes (Shared)
Anita Staggers, MD  Physician Physical Medicine and Rehabilitation   PMR Pre-admission     Signed   Date of Service: 02/03/2022  4:03 PM  Related encounter: ED to Hosp-Admission (Discharged) from 01/27/2022 in [facility]   Signed      Show:Clear all _0 Written_1 Templated_2 Copied  Added by: _3 Cristina Gong, RN_4 Anita Staggers, MD  _5 Hover for details PMR Admission Coordinator Pre-Admission Assessment   Patient: Anita Fletcher is an 76 y.o., female MRN: [id number] DOB: 10-May-1945 Height: 5' 3.5" (161.3 cm) Weight: 74.8 kg   Insurance Information HMO: yes    PPO:      PCP:      IPA:      80/20:      OTHER:  PRIMARY: [employer]      Policy#: [id number]      Subscriber: pt CM Name: Mendel Ryder      Phone#: [phone] Ext [phone]     Fax#: [phone] Pre-Cert#: [id number] approved for 7 days. F/u with Edwena Felty at ext [phone]     Employer:  Benefits:  Phone #: [phone]     Name: 1/6 Eff. Date: 03/31/21     Deduct: none      Out of Pocket Max: $3400      Life Max: none CIR: $295 co pay per day days 1 until 6      SNF: no cop ay days 1 until 20; $196 cop ay ep day days 21 until 100 Outpatient: $10 to $20 per visit     Co-Pay: visits per medical neccesity Home Health: 100%      Co-Pay: visits per medical neccesity DME: 80%     Co-Pay: 20% Providers: in network  SECONDARY: none   Financial Counselor:       Phone#:    [employer]" for patients in Inpatient Rehabilitation Facilities with attached "Privacy Act [place] Records" was provided and verbally reviewed with: Patient   Emergency Contact Information Contact Information       Name Relation Home Work Mobile    [place] Brother [phone]   [phone]    JEFFRIES,WILLIAM Significant other     [phone]         Current Medical History  Patient Admitting Diagnosis: CVA   History of Present Illness: 76 year old female with history  of HTN an atrial fibrillation on Apixaban. Also a history of TIA. Presented to [facility] hospital on 01/27/22 with complaints of episodic right face , Right Upper extremity and lower extremity numbness. Transferred to [facility] on 01/29/22.   Neurology consulted. CT scan was concerning for subcortical left hemispheric infraction obscured by severe small vessel disease. MRI brain revealed small infarct in left thalamus/posterior limb of th internal capsule. 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 Eliquis resumed on 02/02/22. On DAPT with ASA and Brilinta initially and then changed to Brilinta and Eliquis from 02/02/22. HTN initially controlled by Cleviprex and now on Norvasc. LDL 140 and now on Lipitor.    Complete NIHSS TOTAL: 4   Patient's medical record from [facility] and [facility] hospital has been reviewed by the rehabilitation admission coordinator and physician.   Past Medical History  Past Medical History:  Diagnosis Date   Acute CVA (cerebrovascular accident) ([place]) 01/27/2022   HTN (hypertension) 01/27/2022   TIA (transient ischemic attack)      Has the patient had major surgery during 100 days prior to admission? Yes   Family History   family history includes Arrhythmia in her brother.   Current Medications   Current Facility-Administered Medications:     stroke: early stages of recovery book, , Does not apply, Once, Emokpae, Ejiroghene E, MD   acetaminophen (TYLENOL) tablet 650 mg, 650 mg, Oral, Q4H PRN, 650 mg at 02/04/22 0802 **OR** acetaminophen (TYLENOL) 160 MG/5ML solution 650 mg, 650 mg, Per Tube, Q4H PRN **OR** acetaminophen  (TYLENOL) suppository 650 mg, 650 mg, Rectal, Q4H PRN, Emokpae, Ejiroghene E, MD   ALPRAZolam (XANAX) tablet 0.25 mg, 0.25 mg, Oral, BID PRN, Little Ishikawa, MD, 0.25 mg at 02/03/22 1432   amLODipine (NORVASC) tablet 5 mg, 5 mg, Oral, Daily, Little Ishikawa, MD, 5 mg at 02/04/22 0802   apixaban (ELIQUIS) tablet 5 mg, 5 mg, Oral, BID, Ventura Sellers, RPH, 5 mg at 02/04/22 0802   atorvastatin (LIPITOR) tablet 40 mg, 40 mg, Oral, Daily, Emokpae, Ejiroghene E, MD, 40 mg at 02/04/22 0801   hydrALAZINE (APRESOLINE) injection 10 mg, 10 mg, Intravenous, Q4H PRN, Amie Portland, MD, 10 mg at 02/02/22 0648   losartan (COZAAR) tablet 50 mg, 50 mg, Oral, Daily, Beulah Gandy A, NP, 50 mg at 02/04/22 0801   ondansetron (ZOFRAN) injection 4 mg, 4 mg, Intravenous, Q6H PRN, de Sindy Messing, Erven Colla, MD   Oral care mouth rinse, 15 mL, Mouth Rinse, PRN, Amie Portland, MD   senna-docusate (Senokot-S) tablet 1 tablet, 1 tablet, Oral, QHS PRN, Emokpae, Ejiroghene E, MD   senna-docusate (Senokot-S) tablet 1 tablet, 1 tablet, Oral, BID, Garvin Fila, MD, 1 tablet at 02/04/22 0802   [COMPLETED] ticagrelor (BRILINTA) tablet 180 mg, 180 mg, Oral, Once, 180 mg at 01/31/22 2036 **FOLLOWED BY** ticagrelor (BRILINTA) tablet 90 mg, 90 mg, Oral, BID, Rosalin Hawking, MD, 90 mg at 02/04/22 0801   Patients Current Diet:  Diet Order                  DIET DYS 3 Room service appropriate? Yes with Assist; Fluid consistency: Thin  Diet effective now                       Precautions / Restrictions Precautions Precautions: Fall Restrictions Weight Bearing Restrictions: No    Has the patient had 2 or more falls or a fall with injury in the past year? No   Prior Activity Level Community (5-7x/wk): independent, working remote, driving   Prior Functional Level Self Care: Did the patient need help bathing, dressing, using the toilet or eating? Independent   Indoor Mobility: Did the patient need assistance  with walking from room to room (with or without device)? Independent   Stairs: Did the patient need assistance with internal or external stairs (with or without device)? Independent   Functional Cognition: Did the patient need help planning regular tasks such as shopping or remembering to take medications? Independent   Patient Information Are you of Hispanic, Latino/a,or Spanish origin?: A. No, not of Hispanic, Latino/a, or Spanish origin What is your race?: A. White Do you need or want an interpreter to communicate with a doctor or health care staff?: 0. No   Patient's Response To:  Health Literacy and Transportation Is the patient able to  respond to health literacy and transportation needs?: Yes Health Literacy - How often do you need to have someone help you when you read instructions, pamphlets, or other written material from your doctor or pharmacy?: Never In the past 12 months, has lack of transportation kept you from medical appointments or from getting medications?: No In the past 12 months, has lack of transportation kept you from meetings, work, or from getting things needed for daily living?: No   Home Assistive Devices / [place] Devices/Equipment: None Home Equipment: Conservation officer, nature (2 wheels), Grab bars - tub/shower   Prior Device Use: Indicate devices/aids used by the patient prior to current illness, exacerbation or injury? None of the above   Current Functional Level Cognition   Overall Cognitive Status: Impaired/Different from baseline Orientation Level: Oriented X4 Safety/Judgement: Decreased awareness of deficits General Comments: patient veering to R in hallway needing instructional cues to avoid running into nursing station; not able to hear phone on L ear and thought it was not working, could hear on R ear    Extremity Assessment (includes Sensation/Coordination)   Upper Extremity Assessment: RUE deficits/detail RUE Deficits / Details:  Decreased functional use compared to LUE, but could donn socks sitting EOB (did tend to lean); could not feel light touch throughout whole arm, did feel pain in upper arm when pinched-a little bit at forearm--not at all in hand RUE Sensation: decreased light touch RUE Coordination: decreased fine motor, decreased gross motor  Lower Extremity Assessment: RLE deficits/detail RLE Deficits / Details: ROM and strength WFL RLE Sensation: decreased light touch (no sensation in R foot) RLE Coordination: decreased gross motor     ADLs   Overall ADL's : Needs assistance/impaired Eating/Feeding: Set up, Sitting Eating/Feeding Details (indicate cue type and reason): in recliner Grooming: Minimal assistance, Sitting Grooming Details (indicate cue type and reason): in recliner Upper Body Bathing: Minimal assistance, Sitting Upper Body Bathing Details (indicate cue type and reason): in recliner Lower Body Bathing: Moderate assistance Lower Body Bathing Details (indicate cue type and reason): min A +2 sit<>stand Upper Body Dressing : Moderate assistance, Sitting Upper Body Dressing Details (indicate cue type and reason): recliner Lower Body Dressing: Moderate assistance Lower Body Dressing Details (indicate cue type and reason): min A +2 sit<>stand Toilet Transfer: Moderate assistance, +2 for physical assistance, Ambulation Toilet Transfer Details (indicate cue type and reason): Bil HHA; simulated bed>door>back to recliner Toileting- Clothing Manipulation and Hygiene: Total assistance Toileting - Clothing Manipulation Details (indicate cue type and reason): min A +2 sit<>stand General ADL Comments: Pt shows good standing balance and abiilty to reach down to B LE for ADL tasks.     Mobility   Overal bed mobility: Needs Assistance Bed Mobility: Supine to Sit Supine to sit: Supervision, HOB elevated     Transfers   Overall transfer level: Needs assistance Equipment used: Rolling walker (2  wheels) Transfers: Sit to/from Stand Sit to Stand: Min assist General transfer comment: up to stand with cues for hand placement and assist for balance     Ambulation / Gait / Stairs / Wheelchair Mobility   Ambulation/Gait Ambulation/Gait assistance: Mod assist, Min assist Gait Distance (Feet): 70 Feet Assistive device: Rolling walker (2 wheels) Gait Pattern/deviations: Step-through pattern, Decreased stride length, Trunk flexed General Gait Details: incoordination on R with heavy footfall and increased velocity of movement with foot sliding on floor at times; difficulty with turning and assist to keep from running into desk on R side Gait velocity: reduced Gait velocity interpretation: <  1.31 ft/sec, indicative of household ambulator     Posture / Balance Balance Overall balance assessment: Needs assistance Sitting-balance support: Feet supported Sitting balance-Leahy Scale: Fair Standing balance support: Single extremity supported, Bilateral upper extremity supported, Reliant on assistive device for balance Standing balance-Leahy Scale: Poor High Level Balance Comments: sit<>stand x 5 with UE support, but not to RW, standing marching in place with RW 2 x 10 reps     Special needs/care consideration      Previous Home Environment  Living Arrangements: Spouse/significant other  Lives With: Significant other Available Help at Discharge: Friend(s), Available 24 hours/day Type of Home: House Home Layout: Two level Alternate Level Stairs-Rails: Left Alternate Level Stairs-Number of Steps: 10+1+1 Home Access: Stairs to enter Entrance Stairs-Rails: Can reach both Entrance Stairs-Number of Steps: 2 to 3 Bathroom Shower/Tub: Chiropodist: Standard Bathroom Accessibility: Yes How Accessible: Accessible via walker Home Care Services: No Additional Comments: pt manages meds and finances, supervises significant other when he drives   Discharge Living Setting Plans  for Discharge Living Setting: Patient's home, Lives with (comment) (significant other of 15 years) Type of Home at Discharge: House Discharge Home Layout: Two level Alternate Level Stairs-Rails: Left Alternate Level Stairs-Number of Steps: 10 + 1 + 1 Discharge Home Access: Stairs to enter Entrance Stairs-Rails: Right, Left, Can reach both Entrance Stairs-Number of Steps: 2 to 3 Discharge Bathroom Shower/Tub: Tub/shower unit Discharge Bathroom Toilet: Standard Discharge Bathroom Accessibility: Yes How Accessible: Accessible via walker Does the patient have any problems obtaining your medications?: No   Social/Family/Support Systems Patient Roles: Spouse, Caregiver Contact Information: Gwyndolyn Saxon, significant other and brother, Elta [place] Anticipated Caregiver: boyfriend Anticipated Ambulance person Information: see contacts Caregiver Availability: 24/7 Discharge Plan Discussed with Primary Caregiver: Yes Is Caregiver In Agreement with Plan?: Yes Does Caregiver/Family have Issues with Lodging/Transportation while Pt is in Rehab?: Yes   Goals Patient/Family Goal for Rehab: Mod I to supervision with PT, OT and SLP Expected length of stay: ELOS 10 to 12 days Pt/Family Agrees to Admission and willing to participate: Yes Program Orientation Provided & Reviewed with Pt/Caregiver Including Roles  & Responsibilities: Yes   Decrease burden of Care through IP rehab admission: n/a   Possible need for SNF placement upon discharge: not anticipated   Patient Condition: I have reviewed medical records from [facility], spoken with CM, and patient. I met with patient at the bedside for inpatient rehabilitation assessment.  Patient will benefit from ongoing PT and OT, can actively participate in 3 hours of therapy a day 5 days of the week, and can make measurable gains during the admission.  Patient will also benefit from the coordinated team approach during an Inpatient Acute Rehabilitation  admission.  The patient will receive intensive therapy as well as Rehabilitation physician, nursing, social worker, and care management interventions.  Due to bladder management, bowel management, safety, skin/wound care, disease management, medication administration, pain management, and patient education the patient requires 24 hour a day rehabilitation nursing.  The patient is currently mod assist overall with mobility and basic ADLs.  Discharge setting and therapy post discharge at home with home health is anticipated.  Patient has agreed to participate in the Acute Inpatient Rehabilitation Program and will admit today.   Preadmission Screen Completed By:  Cleatrice Burke, 02/04/2022 1:35 PM ______________________________________________________________________   Discussed status with Dr. Naaman Plummer on 02/04/22 at 1334 and received approval for admission today.   Admission Coordinator:  Cleatrice Burke, RN, time 9371 Date 02/04/22  Assessment/Plan: Diagnosis: left thalamic and PLIC infarct  Does the need for close, 24 hr/day Medical supervision in concert with the patient's rehab needs make it unreasonable for this patient to be served in a less intensive setting? Yes Co-Morbidities requiring supervision/potential complications: HTN, a fib, anxiety disorder Due to bladder management, bowel management, safety, skin/wound care, disease management, medication administration, pain management, and patient education, does the patient require 24 hr/day rehab nursing? Yes Does the patient require coordinated care of a physician, rehab nurse, PT, OT, and SLP to address physical and functional deficits in the context of the above medical diagnosis(es)? Yes Addressing deficits in the following areas: balance, endurance, locomotion, strength, transferring, bowel/bladder control, bathing, dressing, feeding, grooming, toileting, cognition, speech, and psychosocial support Can the patient actively  participate in an intensive therapy program of at least 3 hrs of therapy 5 days a week? Yes The potential for patient to make measurable gains while on inpatient rehab is excellent Anticipated functional outcomes upon discharge from inpatient rehab: modified independent and supervision PT, modified independent and supervision OT, modified independent and supervision SLP Estimated rehab length of stay to reach the above functional goals is: 10-12 days Anticipated discharge destination: Home 10. Overall Rehab/Functional Prognosis: excellent     MD Signature: Anita Staggers, MD, [place] Director Rehabilitation Services 02/04/2022          Revision History

## 2022-02-05 DIAGNOSIS — I6381 Other cerebral infarction due to occlusion or stenosis of small artery: Secondary | ICD-10-CM

## 2022-02-05 LAB — CBC WITH DIFFERENTIAL/PLATELET
Abs Immature Granulocytes: 0.03 10*3/uL (ref 0.00–0.07)
Basophils Absolute: 0 10*3/uL (ref 0.0–0.1)
Basophils Relative: 0 %
Eosinophils Absolute: 0.2 10*3/uL (ref 0.0–0.5)
Eosinophils Relative: 3 %
HCT: 42.4 % (ref 36.0–46.0)
Hemoglobin: 14.2 g/dL (ref 12.0–15.0)
Immature Granulocytes: 0 %
Lymphocytes Relative: 17 %
Lymphs Abs: 1.1 10*3/uL (ref 0.7–4.0)
MCH: 30.7 pg (ref 26.0–34.0)
MCHC: 33.5 g/dL (ref 30.0–36.0)
MCV: 91.8 fL (ref 80.0–100.0)
Monocytes Absolute: 0.5 10*3/uL (ref 0.1–1.0)
Monocytes Relative: 8 %
Neutro Abs: 4.9 10*3/uL (ref 1.7–7.7)
Neutrophils Relative %: 72 %
Platelets: 256 10*3/uL (ref 150–400)
RBC: 4.62 MIL/uL (ref 3.87–5.11)
RDW: 12.9 % (ref 11.5–15.5)
WBC: 6.8 10*3/uL (ref 4.0–10.5)
nRBC: 0 % (ref 0.0–0.2)

## 2022-02-05 LAB — COMPREHENSIVE METABOLIC PANEL
ALT: 45 U/L — ABNORMAL HIGH (ref 0–44)
AST: 31 U/L (ref 15–41)
Albumin: 3.3 g/dL — ABNORMAL LOW (ref 3.5–5.0)
Alkaline Phosphatase: 51 U/L (ref 38–126)
Anion gap: 8 (ref 5–15)
BUN: 16 mg/dL (ref 8–23)
CO2: 23 mmol/L (ref 22–32)
Calcium: 8.9 mg/dL (ref 8.9–10.3)
Chloride: 111 mmol/L (ref 98–111)
Creatinine, Ser: 0.81 mg/dL (ref 0.44–1.00)
GFR, Estimated: >60 mL/min (ref >60)
Glucose, Bld: 104 mg/dL — ABNORMAL HIGH (ref 70–99)
Potassium: 3.6 mmol/L (ref 3.5–5.1)
Sodium: 142 mmol/L (ref 135–145)
Total Bilirubin: 0.9 mg/dL (ref 0.3–1.2)
Total Protein: 6 g/dL — ABNORMAL LOW (ref 6.5–8.1)

## 2022-02-05 NOTE — Evaluation (Signed)
Occupational Therapy Assessment and Plan  Patient Details  Name: Anita Fletcher MRN: [id number] Date of Birth: 1945/11/30  OT Diagnosis: ataxia, hemiplegia affecting dominant side, and muscle weakness (generalized), sensation changes  Rehab Potential: Rehab Potential (ACUTE ONLY): Good ELOS: 5-7 days   Today's Date: 02/05/2022 OT Individual Time: 7782-4235 OT Individual Time Calculation (min): 104 min     Hospital Problem: Principal Problem:   Left thalamic infarction [facility])   Past Medical History:  Past Medical History:  Diagnosis Date   Acute CVA (cerebrovascular accident) ([place]) 01/27/2022   HTN (hypertension) 01/27/2022   PAF (paroxysmal atrial fibrillation) ([place])    TIA (transient ischemic attack)    Past Surgical History:  Past Surgical History:  Procedure Laterality Date   EYE SURGERY     IR ANGIO INTRA EXTRACRAN SEL COM CAROTID INNOMINATE BILAT MOD SED  01/30/2022   IR ANGIO VERTEBRAL SEL VERTEBRAL UNI L MOD SED  01/30/2022   IR ANGIO VERTEBRAL SEL VERTEBRAL UNI L MOD SED  01/31/2022   IR ANGIOGRAM FOLLOW UP STUDY  01/31/2022   IR CT HEAD LTD  01/31/2022   IR INTRA CRAN STENT  01/31/2022   IR US GUIDE VASC ACCESS LEFT  01/31/2022   IR US GUIDE VASC ACCESS RIGHT  01/30/2022   RADIOLOGY WITH ANESTHESIA N/A 01/30/2022   Procedure: MRI WITH ANESTHESIA - BRAIN W/O CONSTRAST;  Surgeon: Radiologist, Medication, MD;  Location: [place];  Service: Radiology;  Laterality: N/A;   RADIOLOGY WITH ANESTHESIA N/A 01/31/2022   Procedure: RADIOLOGY WITH ANESTHESIA;  Surgeon: Radiologist, Medication, MD;  Location: [place];  Service: Radiology;  Laterality: N/A;    Assessment & Plan Clinical Impression:  Anita Fletcher is a 76 year old  RH-female with history of HTN, TIA, PAF- on eliquis;  who was admitted on 01/27/22 with 3 day history of intermittent numbness right face, right hand and RLE. She was found to have elevated BP SBP 106 to 206 and HR in 50's. 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 MRI not done due to history of claustrophobia and panic attack She was transferred to [facility] on 10/21 for sedation prior to MRI and she was found to have right hemiataxia with right hypoesthesia and Jacksonian march type dysthesia of RLE followed by RUE followed by face. Dr. 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     MRA brain done revealing sever atherosclerotic disease with severe stenosis at proximal BA and severe stenosis with severe stenosis at left P2/PCA segment. MRI brain showed small acute infarcts left thalamus and posterior limb of internal capsule She underwent cerebral angiogram revealing extreme tortuosity of major neck arteries, 85% stenosis of proximal BA and ulcerated plaque in left carotid bulb. She underwent angioplasty with stenting of BA  with complete resolution of stenosis 11/03 by Dr. Nelida Gores Rogrigues Post procedure, patient found to have left facial droop with bidirectional horizontal nystagmus, decrease in left eye closure and RUE/RLE drift concerning for bran stem infarct.  She required BIPAP for acute respiratory insufficiency. CT head done revealing interval development of small acute left cerebellar infarcts and no bleed.     She was started on IV heparin as well as cleviprex for BP control.  Eliquis resumed 11/05, ASA d/c  and  cleviprex weaned off. Respiratory status improved and weaned off oxygen. Right sided weakness and ataxia noted to be improving but she continues to be limited by nystagmus, dysmetria with hemiplegia, sensory deficits on the left, ataxia with tendency to veer to the right as well as needing diet modification to D3 diet due to numbness right facial weakness with occasional oral  residue post swallow. Therapy has been working with patient and CIR recommended due to functional decline. Patient transferred to CIR on 02/04/2022 .    Patient currently requires CGA to min with basic self-care skills secondary to muscle weakness, decreased cardiorespiratoy endurance, ataxia and decreased coordination, and decreased sitting balance, decreased standing balance, decreased postural control, and hemiplegia.  Prior to hospitalization, patient could complete ADLs and IADls with independent .  Patient will benefit from skilled intervention to decrease level of assist with basic self-care skills, increase independence with basic self-care skills, and increase level of independence with iADL prior to discharge home with care partner.  Anticipate patient will require minimal physical assistance and follow up outpatient.  OT - End of Session Activity Tolerance: Decreased this session Endurance Deficit: Yes Endurance Deficit Description: SOB noted following BADLs- grooming in standing OT Assessment Rehab Potential (ACUTE ONLY): Good OT Patient demonstrates impairments in the following area(s): Balance;Endurance;Motor;Sensory OT Basic ADL's Functional Problem(s): Grooming;Bathing;Dressing;Toileting OT Advanced ADL's Functional Problem(s): Simple Meal Preparation;Light Housekeeping;Laundry OT Transfers Functional Problem(s): Toilet;Tub/Shower OT Additional Impairment(s): Fuctional Use of Upper Extremity OT Plan OT Intensity: Minimum of 1-2 x/day, 45 to 90 minutes OT Frequency: 5 out of 7 days OT Duration/Estimated Length of Stay: 5-7 days OT Treatment/Interventions: Balance/vestibular training;Community reintegration;Disease mangement/prevention;Neuromuscular re-education;Patient/family education;Self Care/advanced ADL retraining;Therapeutic Exercise;UE/LE Coordination activities;Discharge planning;DME/adaptive equipment instruction;Functional mobility training;Pain management;Psychosocial  support;Skin care/wound managment;Therapeutic Activities;UE/LE Strength taining/ROM OT Self Feeding Anticipated Outcome(s): Independnet OT Basic Self-Care Anticipated Outcome(s): Mod I OT Toileting Anticipated Outcome(s): Mod I OT Bathroom Transfers Anticipated Outcome(s): Mod I OT Recommendation Recommendations for Other Services: Therapeutic Recreation consult Therapeutic Recreation Interventions: Kitchen group;Pet therapy;Outing/community reintergration;Stress management;Other (comment) (YOGA) Patient destination: Home Follow Up Recommendations: Outpatient OT Equipment Recommended: To be determined   OT Evaluation Precautions/Restrictions  Precautions Precautions: Fall Restrictions Weight Bearing Restrictions: No General Chart Reviewed: Yes Family/Caregiver Present: No Vital Signs Therapy Vitals Temp: 98.5 F (36.9 C) Temp Source: Oral Pulse Rate: 65 Resp: 16 BP: (Abnormal) 141/69 Patient Position (if appropriate): Lying Oxygen Therapy SpO2: 99 % O2 Device: Room Air Pain Pain Assessment Pain Scale: 0-10 Pain Score: 0-No pain Home Living/Prior Functioning Home Living Available Help at Discharge: Friend(s), Available 24 hours/day Type of Home: House Home Access: Stairs to enter [employer] of Steps: 2 to 3 Entrance Stairs-Rails: Can reach both Home Layout: Two level Alternate Level Stairs-Number of Steps: 10+1+1 Bathroom Shower/Tub: Chiropodist: Standard Bathroom Accessibility: Yes Additional Comments: pt manages meds and finances, supervises significant other when he drives  Lives With: Significant other IADL History Homemaking Responsibilities: Yes Meal Prep Responsibility: Primary Laundry Responsibility: Primary Cleaning Responsibility: Primary Bill Paying/Finance Responsibility: Primary Shopping Responsibility: Primary Child Care Responsibility: No Homemaking Comments: Pt reported she has a Chartered certified accountant that comes 1x/week  to do heavy duty cleaning Current License: Yes Mode of Transportation: Public relations account executive: [employer] + 1 year [employer] Occupation: Works at home Type of Occupation: Assists in reviewing resumes Prior Function Level of Independence: Independent with basic ADLs, Independent with homemaking with ambulation  Able to Take Stairs?: Yes Driving: Yes Vocation: Part time employment Vocation Requirements: Needs to  be able to go up 10 stairs to access computer Vision Baseline Vision/History: 1 Wears glasses Ability to See in Adequate Light: 0 Adequate Patient Visual Report: No change from baseline Vision Assessment?: Yes Eye Alignment: Within Functional Limits Ocular Range of Motion: Within Functional Limits Alignment/Gaze Preference: Within Defined Limits Tracking/Visual Pursuits: Able to track stimulus in all quads without difficulty Saccades: Within functional limits Convergence: Within functional limits Visual Fields: No apparent deficits Perception  Perception: Within Functional Limits Praxis Praxis: Intact Cognition Cognition Overall Cognitive Status: Within Functional Limits for tasks assessed Arousal/Alertness: Awake/alert Memory: Appears intact Attention: Focused;Sustained;Selective Focused Attention: Appears intact Sustained Attention: Appears intact Selective Attention: Appears intact Awareness: Appears intact Problem Solving: Appears intact Safety/Judgment: Appears intact Brief Interview for Mental Status (BIMS) Repetition of Three Words (First Attempt): 3 Temporal Orientation: Year: Correct Temporal Orientation: Month: Accurate within 5 days Temporal Orientation: Day: Correct Recall: "Sock": Yes, no cue required Recall: "Blue": Yes, no cue required Recall: "Bed": Yes, no cue required BIMS Summary Score: 15 Sensation Sensation Light Touch: Impaired by gross assessment Hot/Cold: Appears Intact Proprioception: Impaired by gross assessment Stereognosis: Not  tested Coordination Gross Motor Movements are Fluid and Coordinated: No Fine Motor Movements are Fluid and Coordinated: No Finger Nose Finger Test: Mild dysmetria, Pt demonstrates challenges in proprioception Motor  Motor Motor: Hemiplegia;Ataxia Motor - Skilled Clinical Observations: Mild decrease in U/LE strength on R compaired to L, 4-/5  Trunk/Postural Assessment  Cervical Assessment Cervical Assessment: Within Functional Limits Lumbar Assessment Lumbar Assessment: Within Functional Limits Postural Control Postural Control: Deficits on evaluation Trunk Control: Mild decrease in trunk control when leaning anteiorly  Balance Balance Balance Assessed: Yes Static Sitting Balance Static Sitting - Balance Support: Feet supported Static Sitting - Level of Assistance: 5: Stand by assistance Dynamic Sitting Balance Dynamic Sitting - Balance Support: Feet supported;During functional activity Dynamic Sitting - Level of Assistance: 5: Stand by assistance Dynamic Sitting - Balance Activities: Forward lean/weight shifting Static Standing Balance Static Standing - Level of Assistance: 5: Stand by assistance Dynamic Standing Balance Dynamic Standing - Balance Support: During functional activity Dynamic Standing - Level of Assistance: 5: Stand by assistance Extremity/Trunk Assessment RUE Assessment RUE Assessment: Exceptions to [facility] Active Range of Motion (AROM) Comments: Mild decrease in strength 4-/5 RUE Body System: Neuro RUE Strength RUE Overall Strength: Deficits Right Hand Gross Grasp: Impaired LUE Assessment LUE Assessment: Within Functional Limits  Care Tool Care Tool Self Care Eating   Eating Assist Level: Supervision/Verbal cueing    Oral Care    Oral Care Assist Level: Contact Guard/Toucning assist    Bathing   Body parts bathed by patient: Right arm;Abdomen;Front perineal area;Right lower leg;Chest;Left arm;Left upper leg;Right upper leg;Buttocks;Left lower  leg;Face     Assist Level: Minimal Assistance - Patient > 75%    Upper Body Dressing(including orthotics)   What is the patient wearing?: Pull over shirt   Assist Level: Contact Guard/Touching assist    Lower Body Dressing (excluding footwear)   What is the patient wearing?: Underwear/pull up;Pants Assist for lower body dressing: Contact Guard/Touching assist    Putting on/Taking off footwear   What is the patient wearing?: Shoes;Socks Assist for footwear: Contact Guard/Touching assist       Care Tool Toileting Toileting activity   Assist for toileting: Contact Guard/Touching assist     Care Tool Bed Mobility Roll left and right activity   Roll left and right assist level: Contact Guard/Touching assist    Sit to lying activity   Sit  to lying assist level: Contact Guard/Touching assist    Lying to sitting on side of bed activity   Lying to sitting on side of bed assist level: the ability to move from lying on the back to sitting on the side of the bed with no back support.: Contact Guard/Touching assist     Care Tool Transfers Sit to stand transfer   Sit to stand assist level: Minimal Assistance - Patient > 75%    Chair/bed transfer   Chair/bed transfer assist level: Minimal Assistance - Patient > 75%     Toilet transfer   Assist Level: Minimal Assistance - Patient > 75%     Care Tool Cognition  Expression of Ideas and Wants Expression of Ideas and Wants: 4. Without difficulty (complex and basic) - expresses complex messages without difficulty and with speech that is clear and easy to understand  Understanding Verbal and Non-Verbal Content Understanding Verbal and Non-Verbal Content: 4. Understands (complex and basic) - clear comprehension without cues or repetitions   Memory/Recall Ability Memory/Recall Ability : Current season;That he or she is in a hospital/hospital unit   Refer to Care Plan for [place] 1 OT Short Term Goal 1  (Week 1): STG=LTG d/t pt length of stay  Recommendations for other services: Therapeutic Recreation  Pet therapy, Kitchen group, Stress management, Outing/community reintegration, and Other YOGA    Skilled Therapeutic Intervention Skilled OT evaluation and treatment session initiated with education on OT role provided. Pt received resting in bed in good spirits and motivated to participate in session. Pt reporting mild soreness d/t previous fall at [facility]. OT avoiding placing pressure over bruises to prevent further pain. Pt completed BADLs bed mobility, U/LB bathing seated on shower seat, U/LB dressing seated EOB, and grooming while standing at the sink during session today. Pt demonstrating decreased sensation in RU/LES impacting balance. Pt presenting with decreased proprioception and mild coordination challenges. Pt provided rest breaks during session d/t decreased activity tolerance and to increase safety. Pt would benefit from further OT services to increase safety and independence in BADLs and IADLs.   ADL ADL Eating: Supervision/safety Where Assessed-Eating: Edge of bed Grooming: Contact guard Where Assessed-Grooming: Standing at sink Upper Body Bathing: Contact guard Where Assessed-Upper Body Bathing: Shower Lower Body Bathing: Contact guard Where Assessed-Lower Body Bathing: Shower Upper Body Dressing: Minimal cueing;Supervision/safety Where Assessed-Upper Body Dressing: Edge of bed Lower Body Dressing: Contact guard Where Assessed-Lower Body Dressing: Edge of bed Toileting: Contact guard Where Assessed-Toileting: Glass blower/designer: Therapist, music Method: Counselling psychologist: Energy manager: Curator: Curator Method: Heritage manager: Manufacturing systems engineer  Bed Mobility Bed Mobility: Supine to Sit Supine to Sit: Contact  Guard/Touching assist Transfers Sit to Stand: Contact Guard/Touching assist Stand to Sit: Contact Guard/Touching assist   Discharge Criteria: Patient will be discharged from OT if patient refuses treatment 3 consecutive times without medical reason, if treatment goals not met, if there is a change in medical status, if patient makes no progress towards goals or if patient is discharged from hospital.  The above assessment, treatment plan, treatment alternatives and goals were discussed and mutually agreed upon: by patient  Janey Genta 02/05/2022, 4:43 PM

## 2022-02-05 NOTE — Patient Care Conference (Signed)
Inpatient RehabilitationTeam Conference and Plan of Care Update Date: 02/05/2022   Time: 10:42 AM    Patient Name: Anita Fletcher      Medical Record Number: [id number]  Date of Birth: Jan 01, 1946 Sex: Female         Room/Bed: 4W20C/4W20C-01 Payor Info: Payor: HU[employer]NA MEDICARE / Plan: HU[employer]NA MEDICARE HMO / Product Type: *No Product type* /    Admit Date/Time:  02/04/2022  3:18 PM  Primary Diagnosis:  Left thalamic infarction [facility])  Hospital Problems: Principal Problem:   Left thalamic infarction [facility])    Expected Discharge Date: Expected Discharge Date:  (evals pending)  Team Members Present: Physician leading conference: Dr. Claudette Laws Social Worker Present: Lavera Guise, BSW Nurse Present: Chana Bode, RN PT Present: Casimiro Needle, PT OT Present: Other (comment) Bonnell Public, OT) SLP Present: Eilene Ghazi, SLP PPS Coordinator present : Fae Pippin, SLP     Current Status/Progress Goal Weekly Team Focus  Bowel/Bladder   Continent of bladder with urinary frequency. Last BM 11/7 - continent.   Remain continent.   Monitor for changes in bowel and bladder function.    Swallow/Nutrition/ Hydration   eval pending           ADL's           ADL Retraining    Mobility               Communication                Safety/Cognition/ Behavioral Observations  eval pending            Pain   Denies pain.   Pain of 0.   Assess pain q shift prn.    Skin   Bruising to R groin area.   No skin breakdown.  Monitor skin q shift prn.      Discharge Planning:    Home with significant other; 10+1+1 ste  Team Discussion: Patient with with left brain stem stenosis; decline post treatment. Has both temperature and touch deficits without proprioception issues; and left cranial nerve VII palsy.  Patient on target to meet rehab goals: Evals pending  *See Care Plan and progress notes for long and short-term goals.   Revisions to Treatment Plan:   N/a  Teaching Needs: Safety, medications, dietary modifications, skin care, transfers, toileting, etc  Current Barriers to Discharge: Inaccessible home environment and Lack of/limited family support  Possible Resolutions to Barriers: Family education     Medical Summary Current Status: right hemisensory deficits, Left CN VII palsy , labile HTN  Barriers to Discharge: Medical stability   Possible Resolutions to [employer] Focus: adjust meds after period of permissive HTN, prophyllaxis against dry eye, monitor for Left eye keratitis   Continued Need for Acute Rehabilitation Level of Care: The patient requires daily medical management by a physician with specialized training in physical medicine and rehabilitation for the following reasons: Direction of a multidisciplinary physical rehabilitation program to maximize functional independence : Yes Medical management of patient stability for increased activity during participation in an intensive rehabilitation regime.: Yes Analysis of laboratory values and/or radiology reports with any subsequent need for medication adjustment and/or medical intervention. : Yes   I attest that I was present, lead the team conference, and concur with the assessment and plan of the team.   Chana Bode B 02/05/2022, 2:17 PM

## 2022-02-05 NOTE — Plan of Care (Signed)
  Problem: RH Swallowing Goal: LTG Patient will consume least restrictive diet using compensatory strategies with assistance (SLP) Description: LTG:  Patient will consume least restrictive diet using compensatory strategies with assistance (SLP) Flowsheets (Taken 02/05/2022 1606) LTG: Pt Patient will consume least restrictive diet using compensatory strategies with assistance of (SLP): Modified Independent

## 2022-02-05 NOTE — Progress Notes (Signed)
PROGRESS NOTE   Subjective/Complaints:    Objective:   No results found. Recent Labs    02/03/22 0509 02/05/22 0554  WBC 7.6 6.8  HGB 13.9 14.2  HCT 40.9 42.4  PLT 236 256   Recent Labs    02/03/22 0509 02/05/22 0554  NA 141 142  K 3.3* 3.6  CL 109 111  CO2 23 23  GLUCOSE 96 104*  BUN 14 16  CREATININE 0.79 0.81  CALCIUM 8.7* 8.9    Intake/Output Summary (Last 24 hours) at 02/05/2022 0926 Last data filed at 02/05/2022 0729 Gross per 24 hour  Intake 360 ml  Output --  Net 360 ml        Physical Exam: Vital Signs Blood pressure (!) 152/79, pulse 63, temperature 97.8 F (36.6 C), temperature source Oral, resp. rate 14, height 5' 3.5" (1.613 m), weight 71.7 kg, SpO2 96 %.   General: No acute distress Mood and affect are appropriate Heart: Regular rate and rhythm no rubs murmurs or extra sounds Lungs: Clear to auscultation, breathing unlabored, no rales or wheezes Abdomen: Positive bowel sounds, soft nontender to palpation, nondistended Extremities: No clubbing, cyanosis, or edema Skin: No evidence of breakdown, no evidence of rash Neurologic: Cranial nerves VII palsy on left side  motor strength is 5/5 in bilateral deltoid, bicep, tricep, grip, hip flexor, knee extensors, ankle dorsiflexor and plantar flexor Sensory exam normal sensation to light touch and proprioception in left  upper and lower extremities Reduced temp in RUE Reduce Temp, LT and Proprioception RLE  Cerebellar exam normal finger to nose to finger as well as heel to shin in bilateral upper and lower extremities Musculoskeletal: Full range of motion in all 4 extremities. No joint swelling   Assessment/Plan: 1. Functional deficits which require 3+ hours per day of interdisciplinary therapy in a comprehensive inpatient rehab setting. Physiatrist is providing close team supervision and 24 hour management of active medical problems listed  below. Physiatrist and rehab team continue to assess barriers to discharge/monitor patient progress toward functional and medical goals  Care Tool:  Bathing              Bathing assist       Upper Body Dressing/Undressing Upper body dressing        Upper body assist      Lower Body Dressing/Undressing Lower body dressing            Lower body assist       Toileting Toileting    Toileting assist       Transfers Chair/bed transfer  Transfers assist           Locomotion Ambulation   Ambulation assist              Walk 10 feet activity   Assist           Walk 50 feet activity   Assist           Walk 150 feet activity   Assist           Walk 10 feet on uneven surface  activity   Assist           Wheelchair  Assist               Wheelchair 50 feet with 2 turns activity    Assist            Wheelchair 150 feet activity     Assist          Blood pressure (!) 152/79, pulse 63, temperature 97.8 F (36.6 C), temperature source Oral, resp. rate 14, height 5' 3.5" (1.613 m), weight 71.7 kg, SpO2 96 %.  Medical Problem List and Plan: 1. 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  Antithrombotics: -DVT/anticoagulation:  Pharmaceutical: Eliquis             -antiplatelet therapy: Brilinta 3. Pain Management: Tylenol prn.  4. Mood/Behavior/Sleep: LCSW to follow for evaluation and support.              -antipsychotic agents: N/A             -pt with chronic anxiety disorder                         -xanax prn                         -pt was appropriate on exam today 5. Neuropsych/cognition: This patient is capable of making decisions on her own  behalf. 6. Skin/Wound Care: Routine pressure relief measures.  7. Fluids/Electrolytes/Nutrition: Monitor I/O. Check CMET in am 8. HTN: Monitor BP TID- continues to be labile.  --Continue Amlodipine and Cozaar.  Vitals:   02/04/22 2028 02/05/22 0509  BP: (!) 141/87 (!) 152/79  Pulse: 70 63  Resp: 16 14  Temp: 98.4 F (36.9 C) 97.8 F (36.6 C)  SpO2: 97% 96%  Fair control will allow permissive HTN for now   9. PAF: Monitor HR TID--continue Eliquis- rate is controlled  10. Intermittent hypokalemia: Recheck CMET in am.  11. Constipation: Continue Senna S bid.  12. Congestion: Will add nasocort to help with congestion/HA.     LOS: 1 days A FACE TO FACE EVALUATION WAS PERFORMED  Erick Colace 02/05/2022, 9:26 AM

## 2022-02-05 NOTE — Evaluation (Signed)
Physical Therapy Assessment and Plan  Patient Details  Name: Anita Fletcher MRN: [id number] Date of Birth: 1945/06/29  PT Diagnosis: Abnormality of gait, Ataxic gait, Difficulty walking, Impaired sensation, and Muscle weakness Rehab Potential: Good ELOS: 5-7 days   Today's Date: 02/05/2022 PT Individual Time: 2841-3244 PT Individual Time Calculation (min): 46 min    Hospital Problem: Principal Problem:   Left thalamic infarction [facility])   Past Medical History:  Past Medical History:  Diagnosis Date   Acute CVA (cerebrovascular accident) ([place]) 01/27/2022   HTN (hypertension) 01/27/2022   PAF (paroxysmal atrial fibrillation) ([place])    TIA (transient ischemic attack)    Past Surgical History:  Past Surgical History:  Procedure Laterality Date   EYE SURGERY     IR ANGIO INTRA EXTRACRAN SEL COM CAROTID INNOMINATE BILAT MOD SED  01/30/2022   IR ANGIO VERTEBRAL SEL VERTEBRAL UNI L MOD SED  01/30/2022   IR ANGIO VERTEBRAL SEL VERTEBRAL UNI L MOD SED  01/31/2022   IR ANGIOGRAM FOLLOW UP STUDY  01/31/2022   IR CT HEAD LTD  01/31/2022   IR INTRA CRAN STENT  01/31/2022   IR US GUIDE VASC ACCESS LEFT  01/31/2022   IR US GUIDE VASC ACCESS RIGHT  01/30/2022   RADIOLOGY WITH ANESTHESIA N/A 01/30/2022   Procedure: MRI WITH ANESTHESIA - BRAIN W/O CONSTRAST;  Surgeon: Radiologist, Medication, MD;  Location: [place];  Service: Radiology;  Laterality: N/A;   RADIOLOGY WITH ANESTHESIA N/A 01/31/2022   Procedure: RADIOLOGY WITH ANESTHESIA;  Surgeon: Radiologist, Medication, MD;  Location: [place];  Service: Radiology;  Laterality: N/A;    Assessment & Plan Clinical Impression:  Anita Fletcher is a 76 year old  RH-female with history of HTN, TIA, PAF- on eliquis;  who was admitted on 01/27/22 with 3 day history of intermittent numbness right face, right hand and RLE. She was found to have elevated BP SBP 106 to 206 and HR in 50's. 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 MRI not done due to history of claustrophobia and panic attack She was transferred to [facility] on 10/21 for sedation prior to MRI and she was found to have right hemiataxia with right hypoesthesia and Jacksonian march type dysthesia of RLE followed by RUE followed by face. Dr. 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     MRA brain done revealing sever atherosclerotic disease with severe stenosis at proximal BA and severe stenosis with severe stenosis at left P2/PCA segment. MRI brain showed small acute infarcts left thalamus and posterior limb of internal capsule She underwent cerebral angiogram revealing extreme tortuosity of major neck arteries, 85% stenosis of proximal BA and ulcerated plaque in left carotid bulb. She underwent angioplasty with stenting of BA  with complete resolution of stenosis 11/03 by Dr. Nelida Gores Rogrigues Post procedure, patient found to have left facial droop with bidirectional horizontal nystagmus, decrease in left eye closure and RUE/RLE drift concerning for bran stem infarct.  She required BIPAP for acute respiratory insufficiency. CT head done revealing interval development of small acute left cerebellar infarcts and no bleed.     She was started on IV heparin as well as cleviprex for BP control.  Eliquis resumed 11/05, ASA d/c and  cleviprex weaned off.  Respiratory status improved and weaned off oxygen. Right sided weakness and ataxia noted to be improving but she continues to be limited by nystagmus, dysmetria with hemiplegia, sensory deficits on the left, ataxia with tendency to veer to the right as well as needing diet modification to D3 diet due to numbness right facial weakness with occasional oral residue post swallow. Therapy has been  working with patient and CIR recommended due to functional decline.  Patient transferred to CIR on 02/04/2022.   Patient currently requires CGA with transfers and CGA/min assist for ambulation with no AD up to 124f. She is mod I for bed mobility and static and dynamic sitting tasks secondary to muscle weakness, decreased cardiorespiratoy endurance, ataxia and decreased coordination,  ,  ,  , and decreased standing balance and decreased postural control.  Prior to hospitalization, patient was independent  with mobility and lived with Significant other (boyfriend) in a House home.  Home access is 2 to 3Stairs to enter.      Patient will benefit from skilled PT intervention to maximize safe functional mobility, minimize fall risk, and decrease caregiver burden for planned discharge home with 24 hour supervision.  Anticipate patient will benefit from follow up OP at discharge.  PT - End of Session Activity Tolerance: Tolerates 30+ min activity with multiple rests Endurance Deficit: Yes Endurance Deficit Description: SOB with ambulating short distances. PT Assessment Rehab Potential (ACUTE/IP ONLY): Good PT Barriers to Discharge: Home environment access/layout PT Patient demonstrates impairments in the following area(s): Balance;Endurance;Motor;Sensory;Skin Integrity;Pain;Nutrition PT Transfers Functional Problem(s): Bed to Chair;Car;Floor PT Locomotion Functional Problem(s): Ambulation;Stairs PT Plan PT Intensity: Minimum of 1-2 x/day ,45 to 90 minutes PT Frequency: 5 out of 7 days PT Duration Estimated Length of Stay: 5-7 days PT Treatment/Interventions: Ambulation/gait training;Cognitive remediation/compensation;Discharge planning;DME/adaptive equipment instruction;Functional mobility training;Pain management;Psychosocial support;Splinting/orthotics;Therapeutic Activities;UE/LE Strength taining/ROM;Visual/perceptual remediation/compensation;UE/LE CArts development officerTherapeutic  Exercise;Skin care/wound management;Patient/family education;Neuromuscular re-education;Functional electrical stimulation;Disease management/prevention;Community reintegration;Balance/vestibular training PT Transfers Anticipated Outcome(s): mod i PT Locomotion Anticipated Outcome(s): SPV with LRAD PT Recommendation Recommendations for Other Services: Therapeutic Recreation consult Therapeutic Recreation Interventions: Outing/community reintergration Follow Up Recommendations: Outpatient PT;24 hour supervision/assistance Patient destination: Home Equipment Recommended: To be determined   PT Evaluation Precautions/Restrictions Precautions Precautions: Fall Precaution Comments: Decreased sensation R lower leg and foot. Restrictions Weight Bearing Restrictions: No Pain Pain Assessment Pain Scale: 0-10 Pain Score: 0-No pain Pain Interference Pain Interference Pain Effect on Sleep: 2. Occasionally Pain Interference with Therapy Activities: 1. Rarely or not at all Pain Interference with Day-to-Day Activities: 1. Rarely or not at all Home Living/Prior FLowrysAvailable Help at Discharge: Friend(s);Available 24 hours/day (boyfriend) Type of Home: House Home Access: Stairs to enter ECenter[employer] Energy[employer] Steps: 2 to 3 Entrance Stairs-Rails: Can reach both (garage entrance has B HR) Home Layout: Two level Alternate Level Stairs-Number of Steps: 5 steps (B HR) > landing > 10 steps (L HR) Alternate Level Stairs-Rails: Left;Can reach both Bathroom Shower/Tub: TChiropodist Standard Bathroom Accessibility: Yes Additional Comments: pt manages meds and finances, supervises significant other when he drives  Lives With: Significant other (boyfriend) Prior Function Level of Independence: Independent with basic ADLs;Independent with homemaking with ambulation;Independent with gait;Independent with transfers  Able to Take Stairs?: Yes Driving:  Yes Vocation: Part time employment Vocation Requirements: Needs to be able to go up 10 stairs to access computer Vision/Perception  Vision - History Ability to See in Adequate Light: 0 Adequate Vision - Assessment Eye Alignment: Within Functional Limits Perception Perception: Within Functional Limits  Praxis Praxis: Intact  Cognition Overall Cognitive Status: Within Functional Limits for tasks assessed Arousal/Alertness: Awake/alert Orientation Level: Oriented X4 Memory: Appears intact Awareness: Appears intact Problem Solving: Appears intact Safety/Judgment: Appears intact Sensation Sensation Light Touch: Impaired by gross assessment Hot/Cold: Not tested Proprioception: Not tested Stereognosis: Not tested Additional Comments: Decreased sensation throughout L LE from mid thigh and down, sensation more diminished lower shin and foot. Coordination Gross Motor Movements are Fluid and Coordinated: No Fine Motor Movements are Fluid and Coordinated: No Coordination and Movement Description: stepping while ambulating and stair negotiation not coordinated, occasional overstepping or short stepping Finger Nose Finger Test: Mild dysmetria, Pt demonstrates challenges in proprioception Motor  Motor Motor: Hemiplegia;Ataxia (Hemiparesis R) Motor - Skilled Clinical Observations: over or under stepping of the R LE occasionally with ambulation and stair negotiation.   Trunk/Postural Assessment  Cervical Assessment Cervical Assessment: Within Functional Limits Thoracic Assessment Thoracic Assessment: Within Functional Limits Lumbar Assessment Lumbar Assessment: Within Functional Limits Postural Control Postural Control: Deficits on evaluation Trunk Control: slightly delayed righting reactions with LOB while standing and walking  Balance Balance Balance Assessed: Yes Static Sitting Balance Static Sitting - Balance Support: Feet supported;No upper extremity supported Static Sitting -  Level of Assistance: 6: Modified independent (Device/Increase time) Dynamic Sitting Balance Dynamic Sitting - Balance Support: Feet supported;During functional activity Dynamic Sitting - Level of Assistance: 6: Modified independent (Device/Increase time) Dynamic Sitting - Balance Activities: Forward lean/weight shifting Static Standing Balance Static Standing - Level of Assistance: 5: Stand by assistance (CGA) Dynamic Standing Balance Dynamic Standing - Balance Support: During functional activity Dynamic Standing - Level of Assistance: 5: Stand by assistance;4: Min assist (CGA/min A) Extremity Assessment      RLE Assessment RLE Assessment: Exceptions to [facility] General Strength Comments: tested grossly in sitting RLE Strength Right Hip Flexion: 4/5 Right Knee Flexion: 4/5 Right Knee Extension: 4+/5 Right Ankle Dorsiflexion: 4+/5 Right Ankle Plantar Flexion: 4+/5 LLE Assessment LLE Assessment: Within Functional Limits General Strength Comments: tested grossly in sitting  Care Tool Care Tool Bed Mobility Roll left and right activity   Roll left and right assist level: Independent with assistive device    Sit to lying activity   Sit to lying assist level: Independent with assistive device    Lying to sitting on side of bed activity   Lying to sitting on side of bed assist level: the ability to move from lying on the back to sitting on the side of the bed with no back support.: Independent with assistive device     Care Tool Transfers Sit to stand transfer   Sit to stand assist level: Contact Guard/Touching assist    Chair/bed transfer   Chair/bed transfer assist level: Contact Guard/Touching assist     Physiological scientist transfer assist level: Contact Guard/Touching assist      Care Tool Locomotion Ambulation   Assist level: Minimal Assistance - Patient > 75% Assistive device: No Device Max distance: 192f  Walk 10 feet activity   Assist  level: Contact Guard/Touching assist Assistive device: No Device   Walk 50 feet with 2 turns activity   Assist level: Minimal Assistance - Patient > 75% Assistive device: No Device  Walk 150 feet activity Walk 150 feet activity did not occur: Safety/medical concerns      Walk 10 feet on uneven surfaces activity   Assist level: Minimal Assistance - Patient > 75% Assistive device: Other (comment) (wall hand  railing on L side)  Stairs   Assist level: Minimal Assistance - Patient > 75% Stairs assistive device: 2 hand rails Max number of stairs: 12  Walk up/down 1 step activity   Walk up/down 1 step (curb) assist level: Contact Guard/Touching assist Walk up/down 1 step or curb assistive device: 2 hand rails  Walk up/down 4 steps activity   Walk up/down 4 steps assist level: Minimal Assistance - Patient > 75% Walk up/down 4 steps assistive device: 2 hand rails  Walk up/down 12 steps activity   Walk up/down 12 steps assist level: Minimal Assistance - Patient > 75% Walk up/down 12 steps assistive device: 2 hand rails  Pick up small objects from floor   Pick up small object from the floor assist level: Minimal Assistance - Patient > 75%    Wheelchair Is the patient using a wheelchair?: Yes (for transport to and from gym for time management.) Type of Wheelchair: Manual   Wheelchair assist level: Dependent - Patient 0%    Wheel 50 feet with 2 turns activity   Assist Level: Dependent - Patient 0%  Wheel 150 feet activity   Assist Level: Dependent - Patient 0%    Refer to Care Plan for Long Term Goals  SHORT TERM GOAL WEEK 1 PT Short Term Goal 1 (Week 1): = to LTG's based on ELOS  Recommendations for other services: Therapeutic Recreation  Outing/community reintegration  Skilled Therapeutic Intervention   Pt greeted supine in bed and agreeable to therapy. No c/o pain throughout session.   Evaluation completed (see details above) with patient education regarding purpose of PT  evaluation, PT POC and goals, therapy schedule, weekly team meetings, and other CIR information including safety plan and fall risk safety. Pt performed the below functional mobility tasks with the specified levels of skilled cuing and assistance. Pt presents with weakness and decreased coordination and sensation of the R LE. Ataxic R LE during gait and stair training with either short stepping or over stepping.   Pt left seated in standard chair with posey belt on, call bell in reach, all needs met.   Mobility Bed Mobility Bed Mobility: Sit to Supine;Supine to Sit Supine to Sit: Independent with assistive device Sit to Supine: Independent with assistive device Transfers Transfers: Sit to Stand;Stand to Sit;Stand Pivot Transfers Sit to Stand: Contact Guard/Touching assist Stand to Sit: Contact Guard/Touching assist Stand Pivot Transfers: Contact Guard/Touching assist Transfer (Assistive device): None Locomotion  Gait Ambulation: Yes Gait Distance (Feet): 120 Feet (+31f, 33f Assistive device: 1 person hand held assist (no HHA during 12066fmb, R UE support on wall railing for first 88f14f HHA for second 88ft62fait Gait: Yes Gait Pattern: Impaired Gait Pattern: Ataxic;Decreased step length - left;Decreased stance time - right;Decreased step length - right;Decreased stride length;Decreased hip/knee flexion - right;Decreased hip/knee flexion - left;Poor foot clearance - right Gait velocity: reduced Stairs / Additional Locomotion Stairs: Yes Stairs Assistance: Minimal Assistance - Patient > 75%;Contact Guard/Touching assist Stair Management Technique: Two rails;Forwards;Alternating pattern;Step to pattern Number of Stairs: 12 (4 steps (step to pattern) , rest,  8 steps (reciprocal pattern)) Height of Stairs: 6 Ramp: Minimal Assistance - Patient >75% Wheelchair Mobility Wheelchair Mobility: No   Discharge Criteria: Patient will be discharged from PT if patient refuses treatment 3  consecutive times without medical reason, if treatment goals not met, if there is a change in medical status, if patient makes no progress towards goals or if patient is discharged from hospital.  The above  assessment, treatment plan, treatment alternatives and goals were discussed and mutually agreed upon: by patient  [name], SPT 02/05/2022, 12:09 PM

## 2022-02-05 NOTE — Plan of Care (Signed)
  Problem: Sit to Stand Goal: LTG:  Patient will perform sit to stand in prep for activites of daily living with assistance level (OT) Description: LTG:  Patient will perform sit to stand in prep for activites of daily living with assistance level (OT) Flowsheets (Taken 02/05/2022 1659) LTG: PT will perform sit to stand in prep for activites of daily living with assistance level: Independent with assistive device   Problem: RH Grooming Goal: LTG Patient will perform grooming w/assist,cues/equip (OT) Description: LTG: Patient will perform grooming with assist, with/without cues using equipment (OT) Flowsheets (Taken 02/05/2022 1659) LTG: Pt will perform grooming with assistance level of: Independent with assistive device    Problem: RH Bathing Goal: LTG Patient will bathe all body parts with assist levels (OT) Description: LTG: Patient will bathe all body parts with assist levels (OT) Flowsheets (Taken 02/05/2022 1659) LTG: Pt will perform bathing with assistance level/cueing: Independent with assistive device  LTG: Position pt will perform bathing: Shower   Problem: RH Dressing Goal: LTG Patient will perform upper body dressing (OT) Description: LTG Patient will perform upper body dressing with assist, with/without cues (OT). Flowsheets (Taken 02/05/2022 1659) LTG: Pt will perform upper body dressing with assistance level of: Independent with assistive device Goal: LTG Patient will perform lower body dressing w/assist (OT) Description: LTG: Patient will perform lower body dressing with assist, with/without cues in positioning using equipment (OT) Flowsheets (Taken 02/05/2022 1659) LTG: Pt will perform lower body dressing with assistance level of: Independent with assistive device   Problem: RH Toileting Goal: LTG Patient will perform toileting task (3/3 steps) with assistance level (OT) Description: LTG: Patient will perform toileting task (3/3 steps) with assistance level (OT)  Flowsheets  (Taken 02/05/2022 1659) LTG: Pt will perform toileting task (3/3 steps) with assistance level: Independent with assistive device   Problem: RH Simple Meal Prep Goal: LTG Patient will perform simple meal prep w/assist (OT) Description: LTG: Patient will perform simple meal prep with assistance, with/without cues (OT). Flowsheets (Taken 02/05/2022 1659) LTG: Pt will perform simple meal prep with assistance level of: Independent with assistive device LTG: Pt will perform simple meal prep w/level of: Ambulate with device   Problem: RH Laundry Goal: LTG Patient will perform laundry w/assist, cues (OT) Description: LTG: Patient will perform laundry with assistance, with/without cues (OT). Flowsheets (Taken 02/05/2022 1659) LTG: Pt will perform laundry with assistance level of: Supervision/Verbal cueing   Problem: RH Light Housekeeping Goal: LTG Patient will perform light housekeeping w/assist (OT) Description: LTG: Patient will perform light housekeeping with assistance, with/without cues (OT). Flowsheets (Taken 02/05/2022 1659) LTG: Pt will perform light housekeeping with assistance level of: Supervision/Verbal cueing LTG: Pt will perform light housekeeping w/level of: Ambulate with device   Problem: RH Tub/Shower Transfers Goal: LTG Patient will perform tub/shower transfers w/assist (OT) Description: LTG: Patient will perform tub/shower transfers with assist, with/without cues using equipment (OT) Flowsheets (Taken 02/05/2022 1659) LTG: Pt will perform tub/shower stall transfers with assistance level of: Supervision/Verbal cueing LTG: Pt will perform tub/shower transfers from: Walk in shower

## 2022-02-05 NOTE — Plan of Care (Signed)
  Problem: RH Balance Goal: LTG Patient will maintain dynamic standing balance (PT) Description: LTG:  Patient will maintain dynamic standing balance with assistance during mobility activities (PT) Flowsheets (Taken 02/05/2022 1913) LTG: Pt will maintain dynamic standing balance during mobility activities with:: Supervision/Verbal cueing Note: With LRAD    Problem: Sit to Stand Goal: LTG:  Patient will perform sit to stand with assistance level (PT) Description: LTG:  Patient will perform sit to stand with assistance level (PT) Flowsheets (Taken 02/05/2022 1913) LTG: PT will perform sit to stand in preparation for functional mobility with assistance level: Independent with assistive device Note: With LRAD    Problem: RH Bed to Chair Transfers Goal: LTG Patient will perform bed/chair transfers w/assist (PT) Description: LTG: Patient will perform bed to chair transfers with assistance (PT). Flowsheets (Taken 02/05/2022 1913) LTG: Pt will perform Bed to Chair Transfers with assistance level: Independent with assistive device  Note: With LRAD    Problem: RH Car Transfers Goal: LTG Patient will perform car transfers with assist (PT) Description: LTG: Patient will perform car transfers with assistance (PT). Flowsheets (Taken 02/05/2022 1913) LTG: Pt will perform car transfers with assist:: Supervision/Verbal cueing Note: With LRAD    Problem: RH Ambulation Goal: LTG Patient will ambulate in controlled environment (PT) Description: LTG: Patient will ambulate in a controlled environment, # of feet with assistance (PT). Flowsheets (Taken 02/05/2022 1913) LTG: Pt will ambulate in controlled environ  assist needed:: Supervision/Verbal cueing LTG: Ambulation distance in controlled environment: 256ft With LRAD Goal: LTG Patient will ambulate in home environment (PT) Description: LTG: Patient will ambulate in home environment, # of feet with assistance (PT). Flowsheets (Taken 02/05/2022 1913) LTG:  Pt will ambulate in home environ  assist needed:: Supervision/Verbal cueing LTG: Ambulation distance in home environment: 239ft With LRAD   Problem: RH Stairs Goal: LTG Patient will ambulate up and down stairs w/assist (PT) Description: LTG: Patient will ambulate up and down # of stairs with assistance (PT) Flowsheets (Taken 02/05/2022 1913) LTG: Pt will ambulate up/down stairs assist needed:: Supervision/Verbal cueing LTG: Pt will  ambulate up and down number of stairs: 15 steps with single UE support on L HR per home set up.

## 2022-02-05 NOTE — Evaluation (Signed)
Speech Language Pathology Assessment and Plan  Patient Details  Name: Anita Fletcher MRN: [id number] Date of Birth: [birth year]  SLP Diagnosis: Dysphagia  Rehab Potential: Excellent ELOS: 5-7 days (plan for follow up visit at least 1x for swallowing prior to signing off)   Today's Date: 02/05/2022 SLP Individual Time: 10-1443 SLP Individual Time Calculation (min): 60 min  Hospital Problem: Principal Problem:   Left thalamic infarction [facility])  Past Medical History:  Past Medical History:  Diagnosis Date   Acute CVA (cerebrovascular accident) ([place]) 01/27/2022   HTN (hypertension) 01/27/2022   PAF (paroxysmal atrial fibrillation) ([place])    TIA (transient ischemic attack)    Past Surgical History:  Past Surgical History:  Procedure Laterality Date   EYE SURGERY     IR ANGIO INTRA EXTRACRAN SEL COM CAROTID INNOMINATE BILAT MOD SED  01/30/2022   IR ANGIO VERTEBRAL SEL VERTEBRAL UNI L MOD SED  01/30/2022   IR ANGIO VERTEBRAL SEL VERTEBRAL UNI L MOD SED  01/31/2022   IR ANGIOGRAM FOLLOW UP STUDY  01/31/2022   IR CT HEAD LTD  01/31/2022   IR INTRA CRAN STENT  01/31/2022   IR US GUIDE VASC ACCESS LEFT  01/31/2022   IR US GUIDE VASC ACCESS RIGHT  01/30/2022   RADIOLOGY WITH ANESTHESIA N/A 01/30/2022   Procedure: MRI WITH ANESTHESIA - BRAIN W/O CONSTRAST;  Surgeon: Radiologist, Medication, MD;  Location: [place];  Service: Radiology;  Laterality: N/A;   RADIOLOGY WITH ANESTHESIA N/A 01/31/2022   Procedure: RADIOLOGY WITH ANESTHESIA;  Surgeon: Radiologist, Medication, MD;  Location: [place];  Service: Radiology;  Laterality: N/A;    Assessment / Plan / Recommendation Clinical Impression Anita Fletcher is a 76 year old  RH-female with history of HTN, TIA, PAF- on eliquis;  who was admitted on 01/27/22 with 3 day history of intermittent numbness right face, right hand and RLE. She was found to have elevated BP SBP 106 to 206 and HR in 50's. 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 MRI brain showed small acute infarcts left thalamus and posterior limb of internal capsule. She underwent angioplasty with stenting of BA  with complete resolution of stenosis 11/03. Post procedure, patient found to have left facial droop with bidirectional horizontal nystagmus, decrease in left eye closure and RUE/RLE drift concerning for bran stem infarct.  She required BIPAP for acute respiratory insufficiency. CT head done revealing interval development of small acute left cerebellar infarcts and no bleed. Right sided weakness and ataxia noted to be improving but she continues to be limited by nystagmus, dysmetria with hemiplegia, sensory deficits on the left, ataxia with tendency to veer to the right as well as needing diet modification to D3 diet due to numbness right facial weakness with occasional oral residue post swallow. Therapy has been working with patient and CIR recommended due to functional decline.   SLP consulted to complete cognitive-linguistic evaluation and clinical swallow evaluation (CSE) in the setting of recent L thalamic stroke. Per today's assessment measures, pt presents without evidence of cognitive-linguistic impairment, as further supported by achieving a score of 29/30 on SLUMS (n = 27). OME was remarkable for left facial asymmetry and reported numbness on right cheek. Speech was lightly affected by facial asymmetry however she was perceived as 100% intelligible at the conversation level. Expressive and receptive language skills were [facility] for all  tasks assessed. Pt consumed dysphagia 3 textures with mildly prolonged mastication, and sips of thin liquid through straw with no overt s/sx of aspiration, no evidence of pocketing, and minimal oral residuals. Pt exhibited delayed cough x1, however it should be noted pt exhibited  occasional dry cough throughout assessment without presentation of solids/liquids. SLP recommends regular textures and thin liquids with chopped meats per pt's request. She may consume whole pills with liquid; larger pills cut in half and/or given in applesauce/pudding. Recommend set-up A to help cut foods. Short course SLP services are recommended to assess diet tolerance prior to signing off. Pt verbalized understanding and agreement with plan.    Skilled Therapeutic Interventions          CSE, SLUMS and informal assessment measures administered. Please see report for full details.   SLP Assessment  Patient will need skilled Speech Lanaguage Pathology Services during CIR admission    Recommendations  SLP Diet Recommendations: Age appropriate regular solids;Thin Liquid Administration via: Cup;Straw Medication Administration: Whole meds with liquid (large pills in pudding/applesauce or cut in half per pt request) Compensations: Slow rate;Small sips/bites;Lingual sweep for clearance of pocketing Postural Changes and/or Swallow Maneuvers: Seated upright 90 degrees Oral Care Recommendations: Oral care BID Patient destination: Home Follow up Recommendations: None Equipment Recommended: None recommended by SLP    SLP Frequency 1 to 3 out of 7 days   SLP Duration  SLP Intensity  SLP Treatment/Interventions 5-7 days (plan for follow up visit at least 1x for swallowing prior to signing off)  Minumum of 1-2 x/day, 30 to 90 minutes  Dysphagia/aspiration precaution training;Patient/family education    Pain Pain Assessment Pain Score: 0-No pain  Prior Functioning Cognitive/Linguistic Baseline: Within functional limits Type of Home: House  Lives With: Significant other Available Help at Discharge: Friend(s);Available 24 hours/day Education: [employer] + 1 year [employer] Vocation: Part time employment (hours fluctuate)  SLP Evaluation Cognition Overall Cognitive Status: Within  Functional Limits for tasks assessed Arousal/Alertness: Awake/alert Orientation Level: Oriented X4 Year: 2023 Month: November Day of Week: Correct Attention: Focused;Sustained;Selective Focused Attention: Appears intact Sustained Attention: Appears intact Selective Attention: Appears intact Memory: Appears intact Awareness: Appears intact Problem Solving: Appears intact Safety/Judgment: Appears intact  Comprehension Auditory Comprehension Overall Auditory Comprehension: Appears within functional limits for tasks assessed Conversation: Complex Expression Expression Primary Mode of Expression: Verbal Verbal Expression Overall Verbal Expression: Appears within functional limits for tasks assessed Initiation: No impairment Oral Motor Oral Motor/Sensory Function Overall Oral Motor/Sensory Function: Moderate impairment Facial ROM: Reduced left;Suspected CN VII (facial) dysfunction Facial Symmetry: Abnormal symmetry left;Suspected CN VII (facial) dysfunction Facial Strength: Reduced left;Suspected CN VII (facial) dysfunction Facial Sensation: Reduced right;Suspected CN V (Trigeminal) dysfunction Lingual ROM: Within Functional Limits Lingual Symmetry: Within Functional Limits Lingual Strength: Within Functional Limits Lingual Sensation: Within Functional Limits Velum: Within Functional Limits Mandible: Within Functional Limits Motor Speech Overall Motor Speech: Impaired Phonation: Normal Articulation: Impaired Level of Impairment: Word Intelligibility: Intelligible  Care Tool Care Tool Cognition Ability to hear (with hearing aid or hearing appliances if normally used Ability to hear (with hearing aid or hearing appliances if normally used): 0. Adequate - no difficulty in normal conservation, social interaction, listening to TV   Expression of Ideas and Wants Expression of Ideas and Wants: 4. Without difficulty (complex and basic) - expresses complex messages without difficulty  and with speech that is clear and easy to understand   Understanding Verbal and Non-Verbal Content Understanding Verbal and Non-Verbal Content: 4. Understands (complex and  basic) - clear comprehension without cues or repetitions  Memory/Recall Ability Memory/Recall Ability : Current season;That he or she is in a hospital/hospital unit   PMSV Assessment  PMSV Trial Intelligibility: Intelligible  Bedside Swallowing Assessment General Date of Onset: 01/27/22 Previous Swallow Assessment: 11/04 Diet Prior to this Study: Dysphagia 3 (soft);Thin liquids Temperature Spikes Noted: No Respiratory Status: Room air History of Recent Intubation: Yes (for procedure) Behavior/Cognition: Alert;Pleasant mood;Cooperative Oral Cavity - Dentition: Adequate natural dentition Self-Feeding Abilities: Needs set up Vision: Functional for self-feeding Patient Positioning: Upright in bed Baseline Vocal Quality: Normal Volitional Cough: Strong Volitional Swallow: Able to elicit  Oral Care Assessment Oral Assessment  (WDL): Exceptions to WDL Lips: Asymmetrical Teeth: Intact Tongue: Pink;Moist Mucous Membrane(s): Pink;Moist Saliva: Moist, saliva free flowing Level of Consciousness: Alert Is patient on any of following O2 devices?: None of the above Nutritional status: No high risk factors Oral Assessment Risk : Low Risk Ice Chips Ice chips: Within functional limits Thin Liquid Thin Liquid: Within functional limits Presentation: Straw Nectar Thick Nectar Thick Liquid: Not tested Honey Thick Honey Thick Liquid: Not tested Puree Puree: Within functional limits Solid Solid: Impaired Oral Phase Impairments: Impaired mastication BSE Assessment Risk for Aspiration Impact on safety and function: Mild aspiration risk  Short Term Goals: Week 1: SLP Short Term Goal 1 (Week 1): STG=LTG due to ELOS  Refer to Care Plan for Long Term Goals  Recommendations for other services: None   Discharge  Criteria: Patient will be discharged from SLP if patient refuses treatment 3 consecutive times without medical reason, if treatment goals not met, if there is a change in medical status, if patient makes no progress towards goals or if patient is discharged from hospital.  The above assessment, treatment plan, treatment alternatives and goals were discussed and mutually agreed upon: by patient  Anita Fletcher 02/05/2022, 4:03 PM

## 2022-02-05 NOTE — Progress Notes (Signed)
Inpatient Rehabilitation  Patient information reviewed and entered into eRehab system by Telsa Dillavou M. Mikaia Janvier, M.A., CCC/SLP, PPS Coordinator.  Information including medical coding, functional ability and quality indicators will be reviewed and updated through discharge.    

## 2022-02-06 DIAGNOSIS — E876 Hypokalemia: Secondary | ICD-10-CM

## 2022-02-06 DIAGNOSIS — J302 Other seasonal allergic rhinitis: Secondary | ICD-10-CM

## 2022-02-06 DIAGNOSIS — K5900 Constipation, unspecified: Secondary | ICD-10-CM

## 2022-02-06 MED ORDER — LORATADINE 10 MG PO TABS
10.0000 mg | ORAL_TABLET | Freq: Every day | ORAL | Status: DC
  Administered 2022-02-06 – 2022-02-13 (×8): 10 mg via ORAL
  Filled 2022-02-06 (×8): qty 1

## 2022-02-06 NOTE — Progress Notes (Addendum)
PROGRESS NOTE   Subjective/Complaints: Anita Fletcher reports some nasal congestion.  She reports she has this congestion around this time every year due to allergies.  She is on Nasacort.  At home she uses Allegra.  Review of Systems  Constitutional:  Negative for chills and fever.  HENT:  Positive for congestion.   Respiratory:  Negative for shortness of breath.   Cardiovascular:  Negative for chest pain.  Gastrointestinal:  Negative for abdominal pain, constipation, diarrhea, nausea and vomiting.  Neurological:  Positive for sensory change.    Objective:   No results found. Recent Labs    02/05/22 0554  WBC 6.8  HGB 14.2  HCT 42.4  PLT 256    Recent Labs    02/05/22 0554  NA 142  K 3.6  CL 111  CO2 23  GLUCOSE 104*  BUN 16  CREATININE 0.81  CALCIUM 8.9     Intake/Output Summary (Last 24 hours) at 02/06/2022 1458 Last data filed at 02/06/2022 1221 Gross per 24 hour  Intake 951 ml  Output --  Net 951 ml         Physical Exam: Vital Signs Blood pressure 138/74, pulse 70, temperature 97.8 F (36.6 C), temperature source Oral, resp. rate 17, height 5' 3.5" (1.613 m), weight 71.7 kg, SpO2 100 %.   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 No joint swelling or tenderness noted   Assessment/Plan: 1. Functional deficits which require 3+ hours per day of interdisciplinary therapy in a comprehensive inpatient rehab setting. Physiatrist is providing close team supervision and 24 hour management of active medical problems listed below. Physiatrist and rehab team continue to assess barriers to discharge/monitor patient progress toward functional and medical goals  Care Tool:  Bathing    Body parts bathed by patient: Right arm, Abdomen, Front perineal area, Right lower leg, Chest, Left arm, Left upper leg, Right upper leg, Buttocks, Left lower leg, Face         Bathing assist Assist Level: Minimal Assistance - Patient > 75%     Upper Body Dressing/Undressing Upper body dressing   What is the patient wearing?: Pull over shirt    Upper body assist Assist Level: Contact Guard/Touching assist    Lower Body Dressing/Undressing Lower body dressing      What is the patient wearing?: Underwear/pull up, Pants     Lower body assist Assist for  lower body dressing: Contact Guard/Touching assist     Toileting Toileting    Toileting assist Assist for toileting: Contact Guard/Touching assist     Transfers Chair/bed transfer  Transfers assist     Chair/bed transfer assist level: Contact Guard/Touching assist     Locomotion Ambulation   Ambulation assist      Assist level: Minimal Assistance - Patient > 75% Assistive device: No Device Max distance: 115ft   Walk 10 feet activity   Assist     Assist level: Contact Guard/Touching assist Assistive device: No Device   Walk 50 feet activity   Assist    Assist level: Minimal Assistance - Patient > 75% Assistive device: No Device    Walk 150 feet  activity   Assist Walk 150 feet activity did not occur: Safety/medical concerns         Walk 10 feet on uneven surface  activity   Assist     Assist level: Minimal Assistance - Patient > 75% Assistive device: Other (comment) (wall hand railing on L side)   Wheelchair     Assist Is the patient using a wheelchair?: Yes (for transport to and from gym for time management.) Type of Wheelchair: Manual    Wheelchair assist level: Dependent - Patient 0%      Wheelchair 50 feet with 2 turns activity    Assist        Assist Level: Dependent - Patient 0%   Wheelchair 150 feet activity     Assist      Assist Level: Dependent - Patient 0%   Blood pressure 138/74, pulse 70, temperature 97.8 F (36.6 C), temperature source Oral, resp. rate 17, height 5' 3.5" (1.613 m), weight 71.7 kg, SpO2 100 %.  Medical Problem List and Plan: 1. 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  Antithrombotics: -DVT/anticoagulation:  Pharmaceutical: Eliquis             -antiplatelet therapy: Brilinta 3. Pain Management: Tylenol prn.  4. Mood/Behavior/Sleep: LCSW to follow for evaluation and support.              -antipsychotic agents: N/A             -pt with chronic anxiety disorder                         -xanax prn                         -pt was appropriate on exam today 5. Neuropsych/cognition: This patient is capable of making decisions on her own behalf. 6. Skin/Wound Care: Routine pressure relief measures.  7. Fluids/Electrolytes/Nutrition: Monitor I/O. Check CMET in am 8. HTN: Monitor BP TID- continues to be labile.  --Continue Amlodipine and Cozaar.  Vitals:   02/06/22 0533 02/06/22 1256  BP: 129/75 138/74  Pulse: 61 70  Resp:  16 17  Temp: 98.4 F (36.9 C) 97.8 F (36.6 C)  SpO2: 97% 100%  Well-controlled continue current medications   9. PAF: Monitor HR TID--continue Eliquis- rate is controlled  10. Intermittent hypokalemia: Recheck CMET in am.   -K+  3.6 on 02/05/2022-stable, continue to monitor with routine labs 11. Constipation: Continue Senna S bid.   -11/9 had 2 BMs today.  Improved continue senna S 12. Nasal Congestion likely due to seasonal allergies: Will add nasocort to help with congestion/HA.   -11/9 Start Claritin for allergies.  She used Care[employer] adviser at home.    LOS: 2 days A FACE TO FACE EVALUATION WAS PERFORMED  Fanny Dance 02/06/2022, 2:58 PM

## 2022-02-06 NOTE — Progress Notes (Signed)
Physical Therapy Session Note  Patient Details  Name: Anita Fletcher MRN: [id number] Date of Birth: Apr 21, 1945  Today's Date: 02/06/2022 PT Individual Time: 1100-1157 and 1430-1456 PT Individual Time Calculation (min): 57 min and 26 min  Short Term Goals: Week 1:  PT Short Term Goal 1 (Week 1): = to LTG's based on ELOS  Skilled Therapeutic Interventions/Progress Updates:   Treatment Session 1 Received pt semi-reclined in bed, pt agreeable to PT treatment, and reported headache rated 5/10 - RN aware. Session with emphasis on functional mobility/transfers, generalized strengthening and endurance, dynamic standing balance/coordination, NMR, and gait training. Pt transferred semi-reclined<>sitting EOB with HOB elevated and mod I and donned shoes with set up assist. Pt transferred bed<>WC stand<>pivot with CGA and pt transported to/from room in [facility] dependently for time management purposes. In rehab apartment, pt ambulated 107ft x 2 trials on carpeted floor to simulate bedroom while therapist provided min A for balance and practiced furniture transfer on/off recliner with CGA.   Pt participated in BERG and demonstrates increased fall risk as noted by score of 28/56 on Berg Balance Scale.  (<36= high risk for falls, close to 100%; 37-45 significant >80%; 46-51 moderate >50%; 52-55 lower >25%).  Placed 1.5lb ankle weight on RLE and pt ambulated additional 76ft without AD and min A. Pt reported still not being able to feel RLE but demonstrated improvements in R step length and heel strike. Transferred back into WC with CGA and returned to room. Concluded session with pt sitting in WC, needs within reach, and seatbelt alarm on.   Treatment Session 2 Received pt semi-reclined in bed, pt agreeable to PT treatment, and denied any pain during session. Session with emphasis on functional mobility/transfers, generalized strengthening and endurance, dynamic standing balance/coordination, and gait training. Pt  transferred semi-reclined<>sitting EOB with HOB elevated and mod I and donned shoes with set up assist. Pt stood without AD and CGA and ambulated 357ft without AD and CGA/min A - cues to relax RUE and take larger steps on RLE. Pt declined any further ambulation due to fatigue but was agreeable to standing exercises. Pt performed the following exercises standing with emphasis on LE strength/balance: -alternating marches with 1 UE support 2x20 -mini squats with no UE support 2x10 -hip abduction 2x10 with BUE support 2x10 - noted poor control/coordination on RLE Returned to bed and doffed shoes with set up assist and transferred sit<>supine with mod I. Concluded session with pt semi-reclined in bed, needs within reach, and bed alarm on.   Therapy Documentation Precautions:  Precautions Precautions: Fall Precaution Comments: Decreased sensation R lower leg and foot. Restrictions Weight Bearing Restrictions: No  Therapy/Group: Individual Therapy Martin Majestic PT, DPT  02/06/2022, 6:52 AM

## 2022-02-06 NOTE — Progress Notes (Signed)
Inpatient Rehabilitation Care Coordinator Assessment and Plan Patient Details  Name: Anita Fletcher MRN: [id number] Date of Birth: 1946/01/29  Today's Date: 02/06/2022  Hospital Problems: Principal Problem:   Left thalamic infarction [facility])  Past Medical History:  Past Medical History:  Diagnosis Date   Acute CVA (cerebrovascular accident) ([place]) 01/27/2022   HTN (hypertension) 01/27/2022   PAF (paroxysmal atrial fibrillation) ([place])    TIA (transient ischemic attack)    Past Surgical History:  Past Surgical History:  Procedure Laterality Date   EYE SURGERY     IR ANGIO INTRA EXTRACRAN SEL COM CAROTID INNOMINATE BILAT MOD SED  01/30/2022   IR ANGIO VERTEBRAL SEL VERTEBRAL UNI L MOD SED  01/30/2022   IR ANGIO VERTEBRAL SEL VERTEBRAL UNI L MOD SED  01/31/2022   IR ANGIOGRAM FOLLOW UP STUDY  01/31/2022   IR CT HEAD LTD  01/31/2022   IR INTRA CRAN STENT  01/31/2022   IR US GUIDE VASC ACCESS LEFT  01/31/2022   IR US GUIDE VASC ACCESS RIGHT  01/30/2022   RADIOLOGY WITH ANESTHESIA N/A 01/30/2022   Procedure: MRI WITH ANESTHESIA - BRAIN W/O CONSTRAST;  Surgeon: Radiologist, Medication, MD;  Location: [place];  Service: Radiology;  Laterality: N/A;   RADIOLOGY WITH ANESTHESIA N/A 01/31/2022   Procedure: RADIOLOGY WITH ANESTHESIA;  Surgeon: Radiologist, Medication, MD;  Location: [place];  Service: Radiology;  Laterality: N/A;   Social History:  reports that she has never smoked. She has never used smokeless tobacco. She reports that she does not drink alcohol and does not use drugs.  Family / Support Systems Spouse/Significant Other: Cyndia Diver (S.O) Other Supports: Brother, Doctor, general practice Anticipated Caregiver: S.O, and brother Ability/Limitations of Caregiver: none Caregiver Availability: 24/7 Family Dynamics: support from spouse and brother  Social History Preferred language: English Religion:  Health Literacy - How often do you need to have someone help you when you read instructions, pamphlets,  or other written material from your doctor or pharmacy?: Never Writes: Yes   Abuse/Neglect Abuse/Neglect Assessment Can Be Completed: Yes Physical Abuse: Denies Verbal Abuse: Denies Sexual Abuse: Denies Exploitation of patient/patient's resources: Denies Self-Neglect: Denies  Patient response to: Social Isolation - How often do you feel lonely or isolated from those around you?: Never  Emotional Status Recent Psychosocial Issues: coping Psychiatric History: n/a Substance Abuse History: n/a  Patient / Family Perceptions, Expectations & Goals Pt/Family understanding of illness & functional limitations: yes Premorbid pt/family roles/activities: Independent, working remote and driving Anticipated changes in roles/activities/participation: s.o or brother able to assist if needed at discharge Pt/family expectations/goals: MOD I to [place]: None Premorbid Home Care/DME Agencies: Other (Comment) (RW, Education officer, environmental) Transportation available at discharge: S.O. pouse or brother able to transport Is the patient able to respond to transportation needs?: Yes In the past 12 months, has lack of transportation kept you from medical appointments or from getting medications?: No In the past 12 months, has lack of transportation kept you from meetings, work, or from getting things needed for daily living?: No Resource referrals recommended: Neuropsychology  Discharge Planning Living Arrangements: Spouse/significant other Support Systems: Spouse/significant other, Other relatives Type of Residence: Private residence Insurance Resources: Multimedia programmer (specify) ([employer]) Financial Resources: Family Support, Employment Financial Screen Referred: No Living Expenses: Lives with family Does the patient have any problems obtaining your medications?: No Home Management: Independent Patient/Family Preliminary Plans: s.o. able to assist if  needed. Care Coordinator Barriers to Discharge: Insurance for SNF coverage, Decreased caregiver support,  Lack of/limited family support Care Coordinator Anticipated Follow Up Needs: HH/OP Expected length of stay: 10-12 Days  Clinical Impression SW met with patient introduced self and explained role. Patient anticipates discharging home with some assistance from her S.O. or brother. No additional questions or concerns, sw will continue to follow up.  [name] 02/06/2022, 12:14 PM

## 2022-02-06 NOTE — Progress Notes (Signed)
Occupational Therapy Session Note  Patient Details  Name: Anita Fletcher MRN: [id number] Date of Birth: [birth year]  Today's Date: 02/06/2022 OT Individual Time: 1300-1400 OT Individual Time Calculation (min): 60 min    Short Term Goals: Week 1:  OT Short Term Goal 1 (Week 1): STG=LTG d/t pt length of stay  Skilled Therapeutic Interventions/Progress Updates:     Pt received in room resting in bed in good spirits and motivated to participate in skilled OT session. Pt reported she is continuing to have significant decreased sensation in her RUE especially to light touch, but is having 0/10 pain. Pt reported concerns related to completing job tasks on computer following d/c home. Pt transported to ADL suite total A in wc to participate in OT session with a focus on IADL retraining. Pt completed computer based activities on on desktop computer, key board, and mouse. 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 Pt able to type three sentences with fair accuracy and decreased pace at this time d/t sensation and motor changes. Pt and OT discussed computer features to accommodate for deficits with Pt receptive to education. Pt further educated on body mechanics and proper set-up to facilitate improved use of the RUE for computer tasks with good teach back.  Pt completed fine motor task placing and removing small pegs in peg board. Pt able to pick up small light weight pegs with increased time and maximal effort dropping pegs 50% of the time d/t decreased sensation and motor control. Pt grasp noted to improve throughout task with less dropping. Pt was transported back to her room in her wc total A and left resting in bed with bed alarm on, call bell in reach, and all needs met.   Therapy Documentation Precautions:  Precautions Precautions: Fall Precaution Comments: Decreased sensation R  lower leg and foot. Restrictions Weight Bearing Restrictions: Yes General:   Vital Signs: Therapy Vitals Temp: 97.8 F (36.6 C) Temp Source: Oral Pulse Rate: 70 Resp: 17 BP: 138/74 Patient Position (if appropriate): Lying Oxygen Therapy SpO2: 100 % O2 Device: Room Air Pain: Pain Assessment Pain Score: 0-No pain ADL: ADL Eating: Supervision/safety Where Assessed-Eating: Edge of bed Grooming: Contact guard Where Assessed-Grooming: Standing at sink Upper Body Bathing: Contact guard Where Assessed-Upper Body Bathing: Shower Lower Body Bathing: Contact guard Where Assessed-Lower Body Bathing: Shower Upper Body Dressing: Minimal cueing, Supervision/safety Where Assessed-Upper Body Dressing: Edge of bed Lower Body Dressing: Contact guard Where Assessed-Lower Body Dressing: Edge of bed Toileting: Contact guard Where Assessed-Toileting: Glass blower/designer: Therapist, music Method: Counselling psychologist: Energy manager: Curator: Curator Method: Heritage manager: Radio broadcast assistant   Therapy/Group: Individual Therapy  Janey Genta 02/06/2022, 4:25 PM

## 2022-02-06 NOTE — Progress Notes (Signed)
Physical Therapy Session Note  Patient Details  Name: Anita Fletcher MRN: [id number] Date of Birth: 30-Jan-1946  Today's Date: 02/06/2022 PT Individual Time: 0920-1003 PT Individual Time Calculation (min): 43 min   Short Term Goals: Week 1:  PT Short Term Goal 1 (Week 1): = to LTG's based on ELOS  Skilled Therapeutic Interventions/Progress Updates:     Pt greeted supine in bed and agreeable to therapy. Pt stating she is a little tired this morning, but not having any pain.   Supine>sit with mod I, no cues needed.  Able to sit EOB and don socks and shoes with mod I.  Stand pivot transfer to the L from EOB to WC with no AD and CGA.   Pt transported to main therapy gym via [facility] for time management.   Gait training:  -39f no AD and CGA/min assist. Pt unsteady, short stride and step length bilaterally, but increased step length with R LE and decreased stance phase on L LE. Decreased gait speed, absent arm swing, occasional decreased swing through with R LE causing an anterior LOB with min assist to recover.  -1252fwith L UE support on wall railing for first 6085fnd R UE support on wall railing for second 48f23fd CGA. Cues to equalize step length, increased R heel strike good carryover from cuing.  - 170ft48fft 49f +2 Bilateral HHA and CGA/min assist. Improved gait speed and gait pattern noted with B support. Improved stride and step length and heel strike. One anterior LOB with decreased R LE swing through resulting in min assist for recovery.   NMR:  -side stepping with +2 B HHA 50ft t58fe right and 50ft to24f left. Increased unsteadiness when stepping to the left due to increased WB on R LE, which has decreased strength and sensation. Cues to keep toes pointed fwd.  -backwards walking with +2 B HHA 60 ft. More steady with back stepping than with side stepping. Pt showed good step length and control. Cues for upright posture.    Transported from main therapy gym back to room [facility]  [facility] and [facility].   Sit>supine mod I, no cues needed.  Pt left supine in bed with bed alarm on, call bell in reach, all needs met.   Therapy Documentation Precautions:  Precautions Precautions: Fall Precaution Comments: Decreased sensation R lower leg and foot. Restrictions Weight Bearing Restrictions: No  Therapy/Group: Individual Therapy  [name] [name]9/2023, 8:01 AM

## 2022-02-06 NOTE — Progress Notes (Addendum)
Inpatient Rehabilitation Center Individual Statement of Services  Patient Name:  Anita Fletcher  Date:  02/06/2022  Welcome to the Inpatient Rehabilitation Center.  Our goal is to provide you with an individualized program based on your diagnosis and situation, designed to meet your specific needs.  With this comprehensive rehabilitation program, you will be expected to participate in at least 3 hours of rehabilitation therapies Monday-Friday, with modified therapy programming on the weekends.  Your rehabilitation program will include the following services:  Physical Therapy (PT), Occupational Therapy (OT), Speech Therapy (ST), 24 hour per day rehabilitation nursing, Therapeutic Recreaction (TR), Neuropsychology, Care Coordinator, Rehabilitation Medicine, Nutrition Services, Pharmacy Services, and Other  Weekly team conferences will be held on Wednesdays to discuss your progress.  Your Inpatient Rehabilitation Care Coordinator will talk with you frequently to get your input and to update you on team discussions.  Team conferences with you and your family in attendance may also be held.  Expected length of stay:  10-12 Days  Overall anticipated outcome:  MOD I to Supervison  Depending on your progress and recovery, your program may change. Your Inpatient Rehabilitation Care Coordinator will coordinate services and will keep you informed of any changes. Your Inpatient Rehabilitation Care Coordinator's name and contact numbers are listed  below.  The following services may also be recommended but are not provided by the Inpatient Rehabilitation Center:   Home Health Rehabiltiation Services Outpatient Rehabilitation Services    Arrangements will be made to provide these services after discharge if needed.  Arrangements include referral to agencies that provide these services.  Your insurance has been verified to be:   [employer] Your primary doctor is:  NO PCP  Pertinent information will  be shared with your doctor and your insurance company.  Inpatient Rehabilitation Care Coordinator:  Lavera Guise, [place] [phone] or [phone]  Information discussed with and copy given to patient by: Andria Rhein, 02/06/2022, 10:05 AM

## 2022-02-07 NOTE — Progress Notes (Signed)
PROGRESS NOTE   Subjective/Complaints: Continues to have altered sensation on Right, reports it is not painful. No new concerns.   Review of Systems  Constitutional:  Negative for chills and fever.  HENT:  Positive for congestion.   Respiratory:  Negative for shortness of breath.   Cardiovascular:  Negative for chest pain and palpitations.  Gastrointestinal:  Negative for abdominal pain, constipation, diarrhea, nausea and vomiting.  Neurological:  Positive for tingling and sensory change.    Objective:   No results found. Recent Labs    02/05/22 0554  WBC 6.8  HGB 14.2  HCT 42.4  PLT 256    Recent Labs    02/05/22 0554  NA 142  K 3.6  CL 111  CO2 23  GLUCOSE 104*  BUN 16  CREATININE 0.81  CALCIUM 8.9     Intake/Output Summary (Last 24 hours) at 02/07/2022 1203 Last data filed at 02/07/2022 0814 Gross per 24 hour  Intake 1065 ml  Output --  Net 1065 ml         Physical Exam: Vital Signs Blood pressure 118/73, pulse 71, temperature 98 F (36.7 C), temperature source Oral, resp. rate 16, height 5' 3.5" (1.613 m), weight 71.7 kg, SpO2 96 %.   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 No joint swelling or tenderness noted   Assessment/Plan: 1. Functional deficits which require 3+ hours per day of interdisciplinary therapy in a comprehensive inpatient rehab setting. Physiatrist is providing close team supervision and 24 hour management of active medical problems listed below. Physiatrist and rehab team continue to assess barriers to discharge/monitor patient progress toward functional and medical goals  Care Tool:  Bathing    Body parts bathed by patient: Right arm, Abdomen, Front perineal area, Right lower leg, Chest, Left arm, Left upper leg, Right upper leg, Buttocks, Left lower leg, Face         Bathing assist Assist Level: Minimal Assistance - Patient > 75%     Upper Body Dressing/Undressing Upper body dressing   What is the patient wearing?: Pull over shirt    Upper body assist Assist Level: Contact Guard/Touching assist    Lower Body Dressing/Undressing Lower body dressing      What is the patient wearing?: Underwear/pull up, Pants     Lower body assist Assist for lower body dressing: Contact Guard/Touching assist  Toileting Toileting    Toileting assist Assist for toileting: Contact Guard/Touching assist     Transfers Chair/bed transfer  Transfers assist     Chair/bed transfer assist level: Contact Guard/Touching assist     Locomotion Ambulation   Ambulation assist      Assist level: Minimal Assistance - Patient > 75% Assistive device: No Device Max distance: 178ft   Walk 10 feet activity   Assist     Assist level: Contact Guard/Touching assist Assistive device: No Device   Walk 50 feet activity   Assist    Assist level: Minimal Assistance - Patient > 75% Assistive device: No Device    Walk 150 feet activity   Assist Walk 150 feet activity did  not occur: Safety/medical concerns         Walk 10 feet on uneven surface  activity   Assist     Assist level: Minimal Assistance - Patient > 75% Assistive device: Other (comment) (wall hand railing on L side)   Wheelchair     Assist Is the patient using a wheelchair?: Yes (for transport to and from gym for time management.) Type of Wheelchair: Manual    Wheelchair assist level: Dependent - Patient 0%      Wheelchair 50 feet with 2 turns activity    Assist        Assist Level: Dependent - Patient 0%   Wheelchair 150 feet activity     Assist      Assist Level: Dependent - Patient 0%   Blood pressure 118/73, pulse 71, temperature 98 F (36.7 C), temperature source Oral, resp. rate 16, height 5' 3.5" (1.613 m), weight 71.7 kg, SpO2 96 %.  Medical Problem List and Plan: 1. 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  Antithrombotics: -DVT/anticoagulation:  Pharmaceutical: Eliquis             -antiplatelet therapy: Brilinta 3. Pain Management: Tylenol prn.  4. Mood/Behavior/Sleep: LCSW to follow for evaluation and support.              -antipsychotic agents: N/A             -pt with chronic anxiety disorder                         -xanax prn                         -pt was appropriate on exam today 5. Neuropsych/cognition: This patient is capable of making decisions on her own behalf. 6. Skin/Wound Care: Routine pressure relief measures.  7. Fluids/Electrolytes/Nutrition: Monitor I/O. Check CMET in am 8. HTN: Monitor BP TID- continues to be labile.  --Continue Amlodipine and Cozaar.  Vitals:   02/07/22 0334 02/07/22 0749  BP: (!) 154/86 118/73  Pulse: 69 71  Resp: 16 16  Temp: 98.7 F (37.1 C) 98 F (36.7  C)  SpO2: 97% 96%  11/10 good BP control overall, continue to monitor trend, continue amlodipine and cozaar, continue hydralazine PRN 9. PAF: Monitor HR TID--continue Eliquis- rate is controlled  10. Intermittent hypokalemia: Recheck CMET in am.   -  K+ 3.6 on 02/05/2022-stable, continue to monitor with routine labs  -Recheck level monday 11. Constipation: Continue Senna S bid.   -11/10 LBM yesterday, continue current regimen 12. Nasal Congestion likely due to seasonal allergies: Will add nasocort to help with congestion/HA.   -11/9 Start Claritin for allergies.  She used Careers adviser at home.  -11/10 improved today, continue claritin    LOS: 3 days A FACE TO FACE EVALUATION WAS PERFORMED  Fanny Dance 02/07/2022, 12:03 PM

## 2022-02-07 NOTE — Progress Notes (Signed)
Occupational Therapy Session Note  Patient Details  Name: Anita Fletcher MRN: [id number] Date of Birth: 08/18/1945  Today's Date: 02/07/2022 OT Individual Time: 1300-1415 OT Individual Time Calculation (min): 75 min    Short Term Goals: Week 1:  OT Short Term Goal 1 (Week 1): STG=LTG d/t pt length of stay  Skilled Therapeutic Interventions/Progress Updates:     Pt received in bed receptive to skilled OT session requesting to complete a shower during session today. Pt in good spirits reporting fatigue following previous therapy sessions and 0/10 pain. Pt supine>EOB close supervision. Pt sit>stand RW CGA. Pt ambulated to bathroom RW CGA and completed U/LB bathing seated on shower bench with close supervision. Pt completed U/LB dressing seated on EOB with close supervision. Pt educated on changes in sensation following CVA and on importance of completing regular skin checks with Pt receptive to education. Pt completed grooming tasks brushing her teeth and hair standing at sink with close supervision. Pt transported to ADL suite total A in wc.  Pt completed utensil sorting activity in standing at countertop. Pt noted to drop utensils 25% of the time with education on compensatory strategies provided.  Pt participated in simulated grocery task unloading and loading groceries from overhead cabinet. Pt able to cary and manipulate groceries of different sizes without dropping.  Pt transported back to room total A in wc and left resting in bed with call bell in reach, bed alarm on, and all needs met.   Therapy Documentation Precautions:  Precautions Precautions: Fall Precaution Comments: Decreased sensation R lower leg and foot. Restrictions Weight Bearing Restrictions: Yes    ADL: ADL Eating: Supervision/safety Where Assessed-Eating: Edge of bed Grooming: Supervision/safety Where Assessed-Grooming: Standing at sink Upper Body Bathing: Supervision/safety Where Assessed-Upper Body Bathing:  Shower Lower Body Bathing: Supervision/safety Where Assessed-Lower Body Bathing: Shower Upper Body Dressing: Supervision/safety Where Assessed-Upper Body Dressing: Edge of bed Lower Body Dressing: Supervision/safety Where Assessed-Lower Body Dressing: Edge of bed Toileting: Supervision/safety Where Assessed-Toileting: Glass blower/designer: Therapist, music Method: Counselling psychologist: Energy manager: Curator: Curator Method: Heritage manager: Radio broadcast assistant   Therapy/Group: Individual Therapy  Janey Genta 02/07/2022, 1:50 PM

## 2022-02-07 NOTE — IPOC Note (Signed)
Overall Plan of Care [facility]) Patient Details Name: Anita Fletcher MRN: [id number] DOB: 09/11/1945  Admitting Diagnosis: Left thalamic infarction [facility])  Hospital Problems: Principal Problem:   Left thalamic infarction [facility])     Functional Problem List: Nursing    PT Balance, Endurance, Motor, Sensory, Skin Integrity, Pain, Nutrition  OT Balance, Endurance, Motor, Sensory  SLP Nutrition  TR         Basic ADL's: OT Grooming, Bathing, Dressing, Toileting     Advanced  ADL's: OT Simple Meal Preparation, Light Housekeeping, Laundry     Transfers: PT Bed to Chair, Musician, Floor  OT Toilet, Tub/Shower     Locomotion: PT Ambulation, Stairs     Additional Impairments: OT Fuctional Use of Upper Extremity  SLP Swallowing      TR      Anticipated Outcomes Item Anticipated Outcome  Self Feeding Independnet  Swallowing  Mod I   Basic self-care  Mod I  Toileting  Mod I   Bathroom Transfers Mod I  Bowel/Bladder  Manage bowel w mod I assist  Transfers  mod i  Locomotion  SPV with LRAD  Communication     Cognition     Pain  n/a  Safety/Judgment  manage safety w cues   Therapy Plan: PT Intensity: Minimum of 1-2 x/day ,45 to 90 minutes PT Frequency: 5 out of 7 days PT Duration Estimated Length of Stay: 5-7 days OT Intensity: Minimum of 1-2 x/day, 45 to 90 minutes OT Frequency: 5 out of 7 days OT Duration/Estimated Length of Stay: 5-7 days SLP Intensity: Minumum of 1-2 x/day, 30 to 90 minutes SLP Frequency: 1 to 3 out of 7 days SLP Duration/Estimated Length of Stay: 5-7 days (plan for follow up visit at least 1x for swallowing prior to signing off)   Team Interventions: Nursing Interventions Patient/Family Education, Disease Management/Prevention, Medication Management, Discharge Planning, Skin Care/Wound Management  PT interventions Ambulation/gait training, Cognitive remediation/compensation, Discharge planning, DME/adaptive equipment instruction, Functional  mobility training, Pain management, Psychosocial support, Splinting/orthotics, Therapeutic Activities, UE/LE Strength taining/ROM, Visual/perceptual remediation/compensation, UE/LE Coordination activities, Stair training, Therapeutic Exercise, Skin care/wound management, Patient/family education, Neuromuscular re-education, Functional electrical stimulation, Disease management/prevention, Academic librarian, Training and development officer  OT Interventions Training and development officer, Community reintegration, Disease mangement/prevention, Brewing technologist, Barrister's clerk education, Self Care/advanced ADL retraining, Therapeutic Exercise, UE/LE Coordination activities, Discharge planning, DME/adaptive equipment instruction, Functional mobility training, Pain management, Psychosocial support, Skin care/wound managment, Therapeutic Activities, UE/LE Strength taining/ROM  SLP Interventions Dysphagia/aspiration precaution training, Patient/family education  TR Interventions    SW/CM Interventions Discharge Planning, Psychosocial Support, Patient/Family Education, Disease Management/Prevention   Barriers to Discharge MD  Medical stability and Home enviroment access/loayout  Nursing Decreased caregiver support, Home environment access/layout 2 level 10+1+1 ste w significant other  PT Home environment access/layout    OT      SLP      SW Insurance for SNF coverage, Decreased caregiver support, Lack of/limited family support     Team Discharge Planning: Destination: PT-Home ,OT- Home , SLP-Home Projected Follow-up: PT-Outpatient PT, 24 hour supervision/assistance, OT-  Outpatient OT, SLP-None Projected Equipment Needs: PT-To be determined, OT- To be determined, SLP-None recommended by SLP Equipment Details: PT- , OT-  Patient/family involved in discharge planning: PT- Patient,  OT-Patient, SLP-Patient  MD ELOS: 10-12 days Medical Rehab Prognosis:  Excellent Assessment: The patient has  been admitted for CIR therapies with the diagnosis of  left posterior limb internal capsule, 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 The team will be addressing  functional mobility, strength, stamina, balance, safety, adaptive techniques and equipment, self-care, bowel and bladder mgt, patient and caregiver education. Goals have been set at Mod I to Sup. Anticipated discharge destination is home.        See Team Conference Notes for weekly updates to the plan of care

## 2022-02-07 NOTE — Progress Notes (Signed)
Physical Therapy Session Note  Patient Details  Name: Anita Fletcher MRN: [id number] Date of Birth: [birth year]  Today's Date: 02/07/2022 PT Individual Time: 0804-0857 PT Individual Time Calculation (min): 53 min   Short Term Goals: Week 1:  PT Short Term Goal 1 (Week 1): = to LTG's based on ELOS  Skilled Therapeutic Interventions/Progress Updates:     Pt greeted standing at sink performing personal hygiene tasks with nurse present. Nurse left with pt seated EOB.  Pt agreeable to therapy.  Pt stating the numbness and tingling in her R hand and foot are really uncomortable today, a 6-7/10 pain scale rating. Nursing notified who later arrive and distributed tylenol.   Gait training:  136f, 2020f 20043fNo AD and CGA/light min assist.   Pt demonstrating the following gait deviations and SPT providing the described cuing and facilitation for improvement:    - Decreased R LE coordination at times (improved compared to previous visit. Demos overstepping with R LE at times with decreased heel strike.  -minimal arm swing, but improved since eval.  -R toe out with cues to correct and pt able to carry over - decreased overall unsteadiness and improved gait speed observed compared to previous sessions.   Added obstacles to challenge balance, turning, and stepping over items utilizing agility ladder, bolster, and cones. No AD used  - fwd step through ladder 2 laps. 1st lap with CGA and R HHA, second lap no HHA. R LE toe out with cues to correct and pt able to carry over for a short time before reminder cues are needed.  -fwd step over triangle bolster x4, 2 leading with the L and 2 leading with the R. Pt needs to stop prior to stepping bolster to gain balance, but able to clear bolster fully.  -side stepping through ladder 2 laps, first lap with CGA/min assist and R HHA, second lap with no HHA and CGA/min assist. Occasional posterior and anterior LOB, especially when stepping to the L that required  min assist to help pt regain balance.  -side step over triangle bolster x 4 (2 to the right and 2 to the left), no LOB either direction and able to clear bolster well.  -cone weaving 5 cones x 2 with CGA and no AD. Pt able to turn and navigate cones well with no LOB and minimal unsteadiness.   Stair negotiation x 2, CGA, B HR support.  4 steps (6 inches), step to pattern  First set with B HR support, second set with L HR support. Good right foot clearance and placement. L foot leading with ascend and R foot leading with descend.   Ball catch/toss x 15 on even ground with CGA and x15 standing on airex with CGA/min assist. Min assist for x2 LOB both anterior and posterior. Pt able to catch and toss majority of reps with good control.   Pt amb from gym to room [id number] gait training above.  Stand to sit from RW to EOB with SBA,   Sit>supine with SPV and no cues needed.   Pt left supine in bed with HOB elevated, bed alarm on, call bell in reach, all needs met.    Therapy Documentation Precautions:  Precautions Precautions: Fall Precaution Comments: Decreased sensation R lower leg and foot. Restrictions Weight Bearing Restrictions: Yes  Therapy/Group: Individual Therapy  AnneKayleen MemosT 02/07/2022, 10:33 AM

## 2022-02-07 NOTE — Progress Notes (Signed)
Speech Language Pathology Discharge Summary  Patient Details  Name: Anita Fletcher MRN: [id number] Date of Birth: Feb 12, 1946  Date of Discharge from SLP service:February 07, 2022  Today's Date: 02/07/2022 SLP Individual Time: 1000-1100 SLP Individual Time Calculation (min): 60 min  Skilled Therapeutic Interventions: Skilled ST treatment focused on swallowing goals and stroke education. Pt reports tolerance to regular diet advancement and thin liquids. Reported kitchen has been sending chopped meats as requested. Pt still requiring set-up A and has been exhibiting some difficulty cutting foods. OT is aware. Discussed adaptive equipment (rocker knife). Will defer to OT for meal prep and self feeding strategies. SLP assessed tolerance of regular textures. Pt exhibited timely and effective oral prep, good oral clearance, and without overt s/sx of aspiration. Consumed sips of thin liquids by straw without overt s/sx of aspiration. Continue current diet. Further follow up is not clinically indicated.   Pt inquired about general stroke education. SLP provided facilitated education on stroke, speech intelligibility strategies, as well as strategies for living a brain healthy lifestyle and making informed decisions to improve overall health and wellness using DANCERS acronym (i.e., disease management, activity, nutrition, cognitive stimulation, engagement, relaxation, sleep). Pt actively participated in functional discussion and verbalized understanding through teach back.   Patient was left in bed with alarm activated and immediate needs within reach at end of session. Plan to sign off at this time. No further needs pertaining to SLP services identified.   Patient has met 1 of 1 long term goals.  Patient to discharge at overall Independent level.  Reasons goals not met: All goals met   Clinical Impression/Discharge Summary: Pt has demonstrated functional progress towards swallowing goals meeting 1 out of  1 long-term goals this admission. Pt is currently tolerating a regular consistency diet with thin liquids without overt s/sx of aspiration and independent with implementing safe swallowing precautions and strategies. Recommend this diet at discharge. Education completed. No further SLP needs identified at this time. ST plans to sign off. Follow up intervention is not clinically indicated from a speech perspective at this time.   Care Partner:    Discharge plans ongoing. None needed from a cognitive or swallowing standpoint   Recommendation:  None     Equipment: NA   Reasons for discharge: Treatment goals met   Patient/Family Agrees with Progress Made and Goals Achieved: Yes    Donielle Radziewicz T Masami Plata 02/07/2022, 1:17 PM

## 2022-02-08 MED ORDER — DICLOFENAC SODIUM 1 % EX GEL
2.0000 g | Freq: Four times a day (QID) | CUTANEOUS | Status: DC
  Administered 2022-02-08 – 2022-02-13 (×18): 2 g via TOPICAL
  Filled 2022-02-08: qty 100

## 2022-02-08 NOTE — Progress Notes (Addendum)
PROGRESS NOTE   Subjective/Complaints:  PT noted RIght knee and RIght elbow pain complaints during therapy   Review of Systems  Constitutional:  Negative for chills and fever.  HENT:  Positive for congestion.   Respiratory:  Negative for shortness of breath.   Cardiovascular:  Negative for chest pain and palpitations.  Gastrointestinal:  Negative for abdominal pain, constipation, diarrhea, nausea and vomiting.  Neurological:  Positive for tingling and sensory change.    Objective:   No results found. No results for input(s): "WBC", "HGB", "HCT", "PLT" in the last 72 hours.  No results for input(s): "NA", "K", "CL", "CO2", "GLUCOSE", "BUN", "CREATININE", "CALCIUM" in the last 72 hours.   Intake/Output Summary (Last 24 hours) at 02/08/2022 1443 Last data filed at 02/08/2022 1250 Gross per 24 hour  Intake 948 ml  Output --  Net 948 ml         Physical Exam: Vital Signs Blood pressure 129/77, pulse 89, temperature 97.7 F (36.5 C), resp. rate 20, height 5' 3.5" (1.613 m), weight 71.7 kg, SpO2 (!) 76 %.   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 No joint swelling, +tenderness RIght lateral epicondyle    Assessment/Plan: 1. Functional deficits which require 3+ hours per day of interdisciplinary therapy in a comprehensive inpatient rehab setting. Physiatrist is providing close team supervision and 24 hour management of active medical problems listed below. Physiatrist and rehab team continue to assess barriers to discharge/monitor patient progress toward functional and medical goals  Care Tool:  Bathing    Body parts bathed by patient: Right arm, Abdomen, Front perineal area, Right lower leg, Chest, Left arm, Left upper leg, Right upper leg, Buttocks, Left lower leg, Face         Bathing assist Assist Level: Supervision/Verbal cueing     Upper Body Dressing/Undressing Upper body dressing   What is the patient wearing?: Pull over shirt    Upper body assist Assist Level: Independent with assistive device    Lower Body Dressing/Undressing Lower body dressing      What is the patient wearing?: Underwear/pull up, Pants     Lower body assist Assist for lower body dressing: Independent with assitive device     Toileting Toileting    Toileting assist Assist for toileting: Contact Guard/Touching assist     Transfers Chair/bed transfer  Transfers assist  Chair/bed transfer assist level: Contact Guard/Touching assist     Locomotion Ambulation   Ambulation assist      Assist level: Minimal Assistance - Patient > 75% Assistive device: No Device Max distance: 153ft   Walk 10 feet activity   Assist     Assist level: Contact Guard/Touching assist Assistive device: No Device   Walk 50 feet activity   Assist    Assist level: Minimal Assistance - Patient > 75% Assistive device: No Device    Walk 150 feet activity   Assist Walk 150 feet activity did not occur: Safety/medical concerns          Walk 10 feet on uneven surface  activity   Assist     Assist level: Minimal Assistance - Patient > 75% Assistive device: Other (comment) (wall hand railing on L side)   Wheelchair     Assist Is the patient using a wheelchair?: Yes (for transport to and from gym for time management.) Type of Wheelchair: Manual    Wheelchair assist level: Dependent - Patient 0%      Wheelchair 50 feet with 2 turns activity    Assist        Assist Level: Dependent - Patient 0%   Wheelchair 150 feet activity     Assist      Assist Level: Dependent - Patient 0%   Blood pressure 129/77, pulse 89, temperature 97.7 F (36.5 C), resp. rate 20, height 5' 3.5" (1.613 m), weight 71.7 kg, SpO2 (!) 76 %.  Medical Problem List and Plan: 1. 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  Antithrombotics: -DVT/anticoagulation:  Pharmaceutical: Eliquis             -antiplatelet therapy: Brilinta 3. Pain Management: Tylenol prn.  4. Mood/Behavior/Sleep: LCSW to follow for evaluation and support.              -antipsychotic agents: N/A             -pt with chronic anxiety disorder                         -xanax prn                         -pt was appropriate on exam today 5. Neuropsych/cognition: This patient is capable of making decisions on her own behalf. 6. Skin/Wound Care: Routine pressure relief measures.  7. Fluids/Electrolytes/Nutrition: Monitor I/O. Check CMET in am 8. HTN: Monitor BP TID- continues to be labile.  --Continue Amlodipine and Cozaar.  Vitals:   02/08/22 0438 02/08/22 1255  BP: (!) 162/88 129/77  Pulse: 63 89  Resp: 18 20  Temp: 97.6 F (36.4 C) 97.7 F (36.5 C)  SpO2: 96% (!) 76%  Some lability monitor prior to dosing  changes  9. PAF: Monitor HR TID--continue Eliquis- rate is controlled  10. Intermittent hypokalemia: Recheck CMET in am.   -K+ 3.6 on 02/05/2022-stable, continue to monitor with routine labs  -Recheck level monday 11. Constipation: Continue Senna S bid.   -11/11 type 4 BM this am  12. Nasal Congestion likely due to seasonal  allergies: Will add nasocort to help with congestion/HA.   -11/9 Start Claritin for allergies.  She used Careers adviser at home.  -11/10 improved today, continue claritin   13.  RIght elbow pain lateral epicondylitis trial voltaren  LOS: 4 days A FACE TO FACE EVALUATION WAS PERFORMED  Erick Colace 02/08/2022, 2:43 PM

## 2022-02-08 NOTE — Progress Notes (Signed)
Occupational Therapy Session Note  Patient Details  Name: Anita Fletcher MRN: [id number] Date of Birth: 1945-07-20  Today's Date: 02/08/2022 OT Individual Time: 3235-5732 session 1 OT Individual Time Calculation (min): 47 min  Session 2: 1107-1200   Short Term Goals: Week 1:  OT Short Term Goal 1 (Week 1): STG=LTG d/t pt length of stay  Skilled Therapeutic Interventions/Progress Updates:  Session 1: Pt greeted supine in bed, pt  agreeable to OT intervention. Pt declined need for ADLS as she didn't have her clothes yet. CGA for bed mobility and stand pivot to w/c with no AD. Total A transport to gym, worked on various therapeutic activities to facilitate improved sensory reeducation and NMR to RUE.  Pt completed seated activity where pt used RUE to place weighted clothespins on rod to facilitate improved RUE propripcetion, strength and sensory reintegration, pt needed step by step cues to achieve correct grasp on clothespins but able to complete task with supervision, pt sometimes with difficulty sustaining grasp on clothespins dropping them.   9 Hole Peg Test is used to measure finger dexterity in pts with various neurological diagnoses. - Instructions The pt was instructed to pick up the pegs one at a time, using their dominant hand first and put them into the holes in any order until the holes were all filled. The pt then removed the pegs one at a time and returned them to the container. Both hands were tested separately.  - Results The pt completed the test in  36 seconds on L hand and > 2 mins on R hand. Scores are based on the time taken to complete the activity. The timer started the moment the pt touched the first peg until the moment the last peg hit the container.  - Norms for healthy females ages 68-70+ 60-55 R 17.38 L 18.92 56-60 R 17.86 L 19.48 61-65 R 18.99 L 20.33 66-70 R 19.90 L 21.44 71+ R 22.49 L 24.11  Worked RUE NMR with pt instructed to sit>stand facing mat  table with RUE supported on mat, pt instructed to press into RUE to power into standing x10 with an emphasis on weight bearing through RUE. Pt completed task with CGA with MIN cues for set- up and body mechanics. Pt also completed standing push ups at EOM table x10 reps to facilitate NMR in BUEs.  Attempted lateral leans to RUE however pt reports falling on R elbow at [facility] and reports pain when leaning onto R elbow therefore terminated task for pain mgmt.   Pt also worked on seated [facility] task using Beads with pt instructed to thread beads with RUE to challenge RUE [facility], pt completed task with ease with supervision. Graded task up and had pt complete seated matching game with pt instructed to flip over 2 blocks at a time with an emphasis on in hand manipulation skills. Pt completed task with supervision.  Issued pt small compliant cubes for pt to work on [facility] in her room.  Ended session with pt supine in bed with bed alarm acitvated and al lnee       Session 2:  Pt greeted supine in bed, pt agreeable to OT intervention. Pts brother clothes therefore pt requesting to shower. Pt completed ambulatory shower transfer with no AD and CGA. Pt completed bathing from shower seat with overall supervision.  Pt exited shower with MINA for increased safety d/t wet environment. Pt completed dressing from EOB overall MODI.  Pt completed stand grooming tasks at sink MODI  with good carryover of hemi techniques.  Remainder of session focus on RUE [facility] tasks with pt issued theraputty to facilitate improved grasp strength/coordination. Additionally observed pt grasp/release compliant cubes with pt completing task with supervision, graded task up and had pt use middle digit and thumb to grasp blocks. Education also provided on stacking med cups to work on [employer] ataxia/ incoordination.   Issued pt HEP for theraputty to increase carryover. Pt left seated EOB with all needs wihtin reach.    Therapy Documentation Precautions:   Precautions Precautions: Fall Precaution Comments: Decreased sensation R lower leg and foot. Restrictions Weight Bearing Restrictions: Yes   Pain: No pain    Therapy/Group: Individual Therapy   Pollyann Glen [facility] 02/08/2022, 12:07 PM

## 2022-02-08 NOTE — Progress Notes (Signed)
Physical Therapy Session Note  Patient Details  Name: Anita Fletcher MRN: [id number] Date of Birth: [birth year]  Today's Date: 02/08/2022 PT Individual Time: [phone] PT Individual Time Calculation (min): 42 min   Short Term Goals: Week 1:  PT Short Term Goal 1 (Week 1): = to LTG's based on ELOS  Skilled Therapeutic Interventions/Progress Updates:   Pt received supine in bed and agreeable to PT. Supine>sit transfer without assist or cues. Sitting balance EOB with with supervision assist for pt to don shoes. Stand pivot transfer to [facility] with no AD and CGA.   Gait training without AD 152f and CGA for safety. Performed gait training with Rollator 2x 2051fwith min assist progressing to [place] Village[place]ues for decreased speed, AD parts management and safety in turns. Improved safety with increased distance and use of rollator.   Dynamic gait training without AD forward/reverse, side stepping 1029f 2 bil. Weave through 6 cones, foot tap on 3 cones R and L x 2   Dynamic gait training with rollator weave through 6 cones x 2 and to performed foot tap on 6 inch cone with BUE support on Rollator.  Instruction from PT for safe Ad parts management and attention to the RUE to improve coordination and timing to grasp brake while in SLS.   Pt returned to room and performed stand pivot  transfer to bed with Rollator and cues for safety in turn to navigate tight space in room. Sit>supine completed with supervision assist  and left supine in bed with call bell in reach and all needs met.       Therapy Documentation Precautions:  Precautions Precautions: Fall Precaution Comments: Decreased sensation R lower leg and foot. Restrictions Weight Bearing Restrictions: Yes  Vital Signs: Therapy Vitals Temp: 97.7 F (36.5 C) Pulse Rate: 89 Resp: 20 BP: 129/77 Patient Position (if appropriate): Lying Oxygen Therapy SpO2: (!) 76 % Pain: Pain Assessment Pain Scale: 0-10 Pain Score: 4  Pain Type: Neuropathic  pain Pain Location: Arm Pain Orientation: Right Pain Descriptors / Indicators: Numbness Pain Frequency: Intermittent Pain Onset: On-going Patients Stated Pain Goal: 0 Pain Intervention(s): Medication (See eMAR) Multiple Pain Sites: Yes 2nd Pain Site Pain Score: 4 Pain Type: Acute pain Pain Location: Leg Pain Orientation: Right Pain Frequency: Intermittent Pain Onset: On-going Pain Intervention(s): Medication (See eMAR)    Therapy/Group: Individual Therapy  AusLorie Phenix/01/2022, 4:43 PM

## 2022-02-08 NOTE — Progress Notes (Signed)
Physical Therapy Session Note  Patient Details  Name: Anita Fletcher MRN: [id number] Date of Birth: 1945/04/25  Today's Date: 02/08/2022 PT Individual Time: 1345-1430 PT Individual Time Calculation (min): 45 min   Short Term Goals: Week 1:  PT Short Term Goal 1 (Week 1): = to LTG's based on ELOS  Skilled Therapeutic Interventions/Progress Updates:     Patient in bed upon PT arrival. Patient alert and agreeable to PT session. Patient reported 4/10 R elbow and knee pain with radiation to distal limb during session, RN made aware and provided Tylenol during session. PT provided repositioning, rest breaks, and distraction as pain interventions throughout session. Patient reports hx of joint pain PTA and increased pain since CVA with activity, discussed topical pain interventions for immediate relief and improved activity tolerance in therapy with MD following session.   Therapeutic Activity: Bed Mobility: Patient performed supine to/from sit with mod I. In a flat bed without use of bed rails Transfers: Patient performed sit to/from stand x5 with supervision-CGA. Provided verbal cues for safety due to impulsivity x2. Patient performed an ambulatory toilet transfer x1 with CGA-close supervision. Patient was continent of bladder and performed peri-care, lower body clothing management, and hand hygiene independently.   Gait Training:  Patient ambulated 20 feet limited by R knee pain, concerning patient about tolerance for walking to the main therapy gym. Received pain medication prior to next trial. She ambulated 174 feet and 162 feet without and AD with CGA and intermittent min A on turns. Ambulated with decreased gait speed, decreased step length and height, decreased arm swing R>L and trunk rotation, narrow BOS, forward trunk lean, and intermittent downward head gaze. Provided verbal cues for erect posture, looking ahead, increased arm swing, and increased gait speed for reduced SLS time and improved  balance without AD.  Patient ascended/descended 16x6" steps using B rails x4 steps and L rail only for remaining steps with CGA. Performed reciprocal gait pattern while ascending and step-to gait pattern leading with R while descending. Provided cues for technique and sequencing. Patient reports she would like to be able to go upstairs to her master suit and office at d/c, however, reports her boyfriend (caregiver) does not go up steps due to physical limitations and will not be able to provide assistance or guarding on steps at home.   Patient in bed at end of session with breaks locked, bed alarm set, and all needs within reach.   Therapy Documentation Precautions:  Precautions Precautions: Fall Precaution Comments: Decreased sensation R lower leg and foot. Restrictions Weight Bearing Restrictions: Yes    Therapy/Group: Individual Therapy  Tsuyako Jolley L Dorea Duff PT, DPT, NCS, CBIS  02/08/2022, 3:49 PM

## 2022-02-09 MED ORDER — HYDRALAZINE HCL 10 MG PO TABS
10.0000 mg | ORAL_TABLET | Freq: Once | ORAL | Status: AC
  Administered 2022-02-09: 10 mg via ORAL
  Filled 2022-02-09: qty 1

## 2022-02-09 NOTE — Progress Notes (Signed)
Physical Therapy Session Note  Patient Details  Name: LAURAMAE KNEISLEY MRN: [id number] Date of Birth: 05-10-1945  Today's Date: 02/09/2022 PT Individual Time: 1445-1525 PT Individual Time Calculation (min): 40 min   Short Term Goals: Week 1:  PT Short Term Goal 1 (Week 1): = to LTG's based on ELOS  Skilled Therapeutic Interventions/Progress Updates:    Pt received seated in bed, agreeable to PT session. Pt reports ongoing nerve pain in her R hand up to her elbow and her R foot up to her knee. Pt has some pain-relieving gel on her R hand for pain management. Supine to sit with Supervision. Sit to stand with CGA and no AD. Ambulation x 100 ft with no AD and CGA for balance. Pt exhibits decreased gait speed, decreased control of RLE, and increased L lateral lean during gait. Standing alt L/R colored target taps with min A for balance, increased difficulty reaching with RLE as compared to LLE though pt does exhibit occasional LOB when standing on RLE due to decreased ankle stability. Progressed exercise by adding YTB around ankles, pt needs increased time to complete task safely. Sidesteps L/R in // bars with YTB around ankles, 3 x 10 ft each direction. Monster walks 6 x 10 ft in // bars with no UE support and CGA for balance. Ambulation x 300 ft with no AD and CGA for balance with focus on increasing pt endurance level, 3 standing rest breaks needed. Pt returned to bed and left seated in bed with needs in reach at end of session.  Therapy Documentation Precautions:  Precautions Precautions: Fall Precaution Comments: Decreased sensation R lower leg and foot. Restrictions Weight Bearing Restrictions: Yes      Therapy/Group: Individual Therapy   Peter Congo, PT, DPT, CSRS 02/09/2022, 3:38 PM

## 2022-02-09 NOTE — Progress Notes (Signed)
Patient at the beginning of shift has BP of 174/94. She complained of dyspnea and anxiety. Lungs clear on auscultation. Xanax administered and patient claimed she was relieved of SOB and anxiety, Her SBP, however remained over 160. Hydralazine 10 mg tab given. BP recheck were still elevated. Patient resting at this time and denies any discomfort. Dr. Sharion Settler informed with order.

## 2022-02-10 DIAGNOSIS — M792 Neuralgia and neuritis, unspecified: Secondary | ICD-10-CM

## 2022-02-10 DIAGNOSIS — G4700 Insomnia, unspecified: Secondary | ICD-10-CM

## 2022-02-10 LAB — CBC
HCT: 39.7 % (ref 36.0–46.0)
Hemoglobin: 13.5 g/dL (ref 12.0–15.0)
MCH: 31.6 pg (ref 26.0–34.0)
MCHC: 34 g/dL (ref 30.0–36.0)
MCV: 93 fL (ref 80.0–100.0)
Platelets: 268 10*3/uL (ref 150–400)
RBC: 4.27 MIL/uL (ref 3.87–5.11)
RDW: 13 % (ref 11.5–15.5)
WBC: 7.8 10*3/uL (ref 4.0–10.5)
nRBC: 0 % (ref 0.0–0.2)

## 2022-02-10 LAB — BASIC METABOLIC PANEL
Anion gap: 10 (ref 5–15)
BUN: 16 mg/dL (ref 8–23)
CO2: 20 mmol/L — ABNORMAL LOW (ref 22–32)
Calcium: 8.6 mg/dL — ABNORMAL LOW (ref 8.9–10.3)
Chloride: 110 mmol/L (ref 98–111)
Creatinine, Ser: 0.64 mg/dL (ref 0.44–1.00)
GFR, Estimated: >60 mL/min (ref >60)
Glucose, Bld: 97 mg/dL (ref 70–99)
Potassium: 3.4 mmol/L — ABNORMAL LOW (ref 3.5–5.1)
Sodium: 140 mmol/L (ref 135–145)

## 2022-02-10 MED ORDER — MELATONIN 5 MG PO TABS
5.0000 mg | ORAL_TABLET | Freq: Every day | ORAL | Status: DC
  Administered 2022-02-10 – 2022-02-12 (×3): 5 mg via ORAL
  Filled 2022-02-10 (×3): qty 1

## 2022-02-10 MED ORDER — POTASSIUM CHLORIDE CRYS ER 20 MEQ PO TBCR: 20.0000 meq | EXTENDED_RELEASE_TABLET | Freq: Two times a day (BID) | ORAL | Status: DC

## 2022-02-10 MED ORDER — POTASSIUM CHLORIDE CRYS ER 20 MEQ PO TBCR
20.0000 meq | EXTENDED_RELEASE_TABLET | Freq: Once | ORAL | Status: AC
  Administered 2022-02-10: 20 meq via ORAL
  Filled 2022-02-10: qty 1

## 2022-02-10 MED ORDER — POTASSIUM CHLORIDE CRYS ER 10 MEQ PO TBCR
10.0000 meq | EXTENDED_RELEASE_TABLET | Freq: Every day | ORAL | Status: DC
  Administered 2022-02-11 – 2022-02-13 (×3): 10 meq via ORAL
  Filled 2022-02-10 (×3): qty 1

## 2022-02-10 NOTE — Progress Notes (Signed)
Physical Therapy Session Note  Patient Details  Name: Anita Fletcher MRN: [id number] Date of Birth: 1946-01-23  Today's Date: 02/10/2022 PT Individual Time: 0900-1000 PT Individual Time Calculation (min): 60 min   Short Term Goals: Week 1:  PT Short Term Goal 1 (Week 1): = to LTG's based on ELOS  Skilled Therapeutic Interventions/Progress Updates:  Patient greeted supine in bed and agreeable to PT treatment session. Patient transitioned from supine to sitting EOB ModI- While sitting EOB, patient was able to doff socks and don shoes.   Patient gait trained ~100' from room to rehab gym without AD and CGA for safety. Patient notably unsteady throughout gait without the use of an AD, however no true LOB occurred.   Patient gait trained 2 x190' with 4WW and SBA- Patient required max verbal cues for appropriately locking/unlocking brakes and proper hand placement when transitioning to/from sitting and standing. Improved carryover from first gait trial to second.   Patient ambulated to/from day room gym and main gym with [phone] and Supv. Once in main gym patient ascended/descended x4 steps with B HR and SBA and then ascended/descended x12 steps with L HR and CGA- VC for using a step-to pattern and leading with R LE while descending for improved stability.   Patient stood without UE support and performed R foot taps to 6" step with 2.5# weight donned, 3 x 20. Patient then performed L foot taps to 6" step with CGA/MinA for stability- Patient performed total of 3 x 20 with seated rest break in between.    Patient ambulated back to her room with 4WW and supv for safety- Patient left sitting EOB with bed alarm on, call bell within reach, tray table in front and all needs met.    Therapy Documentation Precautions:  Precautions Precautions: Fall Precaution Comments: Decreased sensation R lower leg and foot. Restrictions Weight Bearing Restrictions: Yes  Therapy/Group: Individual Therapy  Tzippy Testerman 02/10/2022, 8:02 AM

## 2022-02-10 NOTE — Progress Notes (Signed)
Patient c/o of increased RLE edema and pain;positive Homan's sign. PA notified. Confirmation patient is currently on Brilinta and Eliquis.

## 2022-02-10 NOTE — Progress Notes (Signed)
Occupational Therapy Session Note  Patient Details  Name: Anita Fletcher MRN: [id number] Date of Birth: [birth year]  Session 1: Today's Date: 02/10/2022 OT Individual Time: 3710-6269 OT Individual Time Calculation (min): 29 min   Session 2: OT Individual Time: 1432-1530 OT Individual Time Calculation (min): 58 min  Short Term Goals: Week 1:  OT Short Term Goal 1 (Week 1): STG=LTG d/t pt length of stay  Skilled Therapeutic Interventions/Progress Updates:     Session 1:  Patient agreeable to participate in OT session. Reports 0/10 pain level. States that her right hand feels numb and the sensation is decreased.   Patient participated in skilled OT session focusing on ADL re-training, functional mobility, standing balance and tolerance, activity tolerance/endurance and RUE FMC. Therapist integrated standing balance/tolerance with right coordination while completing grooming task standing at sink with RW in order to improve overall functional performance and ability to complete BADL tasks with less fatigue and difficulty. Pt provided with SBA  during standing activity and did not rely on any surface or AD to maintain balance. All functional mobility completed with SBA with RW within pt's room.  Pt completed fine motor coordination task with her right hand utilizing colored pegs & pegboard. Utilized tweezers to pick up pegs and place/remove from pegboard. VC provided for grasp technique to compensate for lack of sensation in the right and to decrease level of difficulty. Increased time required to complete task with moderate difficulty.  Session 2: Patient agreeable to participate in OT session. Reports 0/10 pain level.   Patient participated in skilled OT session focusing on functional transfers and NM re-education of the RUE. Therapist facilitated increased RUE coordination in strength in order to improve functional performance needed during self care tasks. - Fine motor coordination:  Red  resistive clothespin utilized to pick up and stack 4 small foam pieces; 4 separate stack completed; min difficulty.  Yellow theraputty: flatten (standing), roll, 3 point pinch, lateral pinch, grip (10X). Located 8/8 small/medium beads. Flipped a full deck of cards 1 at a time; flipped to the right then to the top; increased time needed. Left hand use to stabilize deck and prevent movement.  - UE strengthening:  Yellow band; seated, shoulder flexion, IR/er, horizontal abduction; 10X. Set-up required of band and VC for form and technique.   -Provided both yellow band and yellow putty for use in room.  - Pt complete all functional transfers during session with Rolator and SBA.      Therapy Documentation Precautions:  Precautions Precautions: Fall Precaution Comments: Decreased sensation R lower leg and foot. Restrictions Weight Bearing Restrictions: Yes   Therapy/Group: Individual Therapy  Limmie Patricia, OTR/L,CBIS  Supplemental OT - [facility]  02/10/2022, 7:58 AM

## 2022-02-10 NOTE — Progress Notes (Signed)
PROGRESS NOTE   Subjective/Complaints:  No new concerns or complaints this AM. She continues to have numbness on her right arm and leg.   Review of Systems  Constitutional:  Negative for chills and fever.  HENT:  Positive for congestion.   Eyes:  Negative for double vision.  Respiratory:  Negative for shortness of breath.   Cardiovascular:  Negative for chest pain and palpitations.  Gastrointestinal:  Negative for abdominal pain, constipation, diarrhea, nausea and vomiting.  Genitourinary:  Negative for dysuria.  Neurological:  Positive for tingling and sensory change. Negative for speech change.    Objective:   No results found. Recent Labs    02/10/22 0510  WBC 7.8  HGB 13.5  HCT 39.7  PLT 268    Recent Labs    02/10/22 0510  NA 140  K 3.4*  CL 110  CO2 20*  GLUCOSE 97  BUN 16  CREATININE 0.64  CALCIUM 8.6*     Intake/Output Summary (Last 24 hours) at 02/10/2022 1627 Last data filed at 02/10/2022 1259 Gross per 24 hour  Intake 950 ml  Output --  Net 950 ml         Physical Exam: Vital Signs Blood pressure 139/81, pulse 72, temperature 97.6 F (36.4 C), temperature source Oral, resp. rate 18, height 5' 3.5" (1.613 m), weight 71.7 kg, SpO2 97 %.   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 No joint swelling, +tenderness RIght lateral epicondyle    Assessment/Plan: 1. Functional deficits which require 3+ hours per day of interdisciplinary therapy in a comprehensive inpatient rehab setting. Physiatrist is providing close team supervision and 24 hour management of active medical problems listed below. Physiatrist and rehab team continue to assess barriers to discharge/monitor patient progress toward functional and medical goals  Care Tool:  Bathing    Body parts bathed by patient: Right arm, Abdomen, Front perineal area, Right lower leg, Chest, Left arm, Left upper leg, Right upper leg, Buttocks, Left lower leg, Face         Bathing assist Assist Level: Supervision/Verbal cueing     Upper Body Dressing/Undressing Upper body dressing   What is the patient wearing?: Pull over shirt    Upper body assist Assist Level: Independent with assistive device    Lower Body Dressing/Undressing Lower body dressing      What is the  patient wearing?: Underwear/pull up, Pants     Lower body assist Assist for lower body dressing: Independent with assitive device     Toileting Toileting    Toileting assist Assist for toileting: Contact Guard/Touching assist     Transfers Chair/bed transfer  Transfers assist     Chair/bed transfer assist level: Contact Guard/Touching assist     Locomotion Ambulation   Ambulation assist      Assist level: Minimal Assistance - Patient > 75% Assistive device: No Device Max distance: 134ft   Walk 10 feet activity   Assist     Assist level: Contact Guard/Touching assist Assistive device: No Device   Walk 50 feet activity   Assist    Assist level: Minimal  Assistance - Patient > 75% Assistive device: No Device    Walk 150 feet activity   Assist Walk 150 feet activity did not occur: Safety/medical concerns         Walk 10 feet on uneven surface  activity   Assist     Assist level: Minimal Assistance - Patient > 75% Assistive device: Other (comment) (wall hand railing on L side)   Wheelchair     Assist Is the patient using a wheelchair?: Yes (for transport to and from gym for time management.) Type of Wheelchair: Manual    Wheelchair assist level: Dependent - Patient 0%      Wheelchair 50 feet with 2 turns activity    Assist        Assist Level: Dependent - Patient 0%   Wheelchair 150 feet activity     Assist      Assist Level: Dependent - Patient 0%   Blood pressure 139/81, pulse 72, temperature 97.6 F (36.4 C), temperature source Oral, resp. rate 18, height 5' 3.5" (1.613 m), weight 71.7 kg, SpO2 97 %.  Medical Problem List and Plan: 1. 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  Antithrombotics: -DVT/anticoagulation:  Pharmaceutical: Eliquis             -antiplatelet therapy: Brilinta 3. Pain Management: Tylenol prn.   -Consider gabapentin for neuropathic pain if numbness sensation becomes painful 4. Mood/Behavior/Sleep: LCSW to follow for evaluation and support.              -antipsychotic agents: N/A             -pt with chronic anxiety disorder                         -xanax prn                         -pt was appropriate on exam today 5. Neuropsych/cognition: This patient is capable of making decisions on her own behalf. 6. Skin/Wound Care: Routine pressure relief measures.  7. Fluids/Electrolytes/Nutrition: Monitor  I/O. Check CMET in am 8. HTN: Monitor BP TID- continues to be labile.  --Continue Amlodipine and Cozaar.  Vitals:   02/10/22 0539 02/10/22 1259  BP: 138/72 139/81  Pulse: 65 72  Resp: 18 18  Temp: 97.8 F (36.6 C) 97.6 F (36.4 C)  SpO2: 97% 97%  Some lability monitor prior to dosing changes -11/13 Continue to have lability, Has been well controlled today, continue to monitor trend 9. PAF: Monitor HR TID--continue Eliquis- rate is controlled  10. Intermittent hypokalemia: Recheck CMET in am.   -K+ 3.6 on 02/05/2022-stable, continue to monitor with routine labs  -K+ 3.4 today, will give k+ Today and start daily 11. Constipation: Continue Senna S bid.   -11/13 BM today, continue to monitor 12. Nasal Congestion likely due to seasonal allergies: Will add nasocort to help with congestion/HA.   -11/9 Start Claritin for allergies.  She used Careers adviser at home.  -11/10 improved today, continue claritin   13.  RIght elbow pain lateral epicondylitis trial voltaren  14. Insomnia  -Trazodone PRN  -11/13 Start melatonin HS   LOS: 6 days A FACE TO FACE EVALUATION WAS PERFORMED  Fanny Dance 02/10/2022, 4:27 PM

## 2022-02-10 NOTE — Progress Notes (Signed)
Occupational Therapy Session Note  Patient Details  Name: Anita Fletcher MRN: [id number] Date of Birth: 1946/01/10  Today's Date: 02/10/2022 OT Individual Time: 1100-1200 OT Individual Time Calculation (min): 60 min   Short Term Goals: Week 1:  OT Short Term Goal 1 (Week 1): STG=LTG d/t pt length of stay  Skilled Therapeutic Interventions/Progress Updates:     Pt received resting in bed receptive to skilled OT session requesting to complete a shower this AM. Pt reporting 0/10 pain and no improvement in sensation changes following CVA.   Pt transitioned from supine to sitting EOB Mod I with HOB elevated. Pt ambulated in room without AD CGA for safety to retrieve clean cloths from closet and drawers. Pt demonstrating good insight to deficits and safety awareness sitting down to remove clothing from over night bag and using wall to balance when opening/closing closet door.   Pt ambulated to shower with rollator close supervision. Pt doffed shirt standing up while leaning back on wall to maintain balance. Pt sat on tub bench to doff pants and socks demonstrating carry over of safety strategies from previous sessions. Pt completed U/LB bathing with supervision seated on tub bench. Pt noted to drop toiletry items three times during shower with education provided for adaptations for sensory loss.   Pt completed U/LB dressing sitting EOB with close supervision. Pt required min verbal cues for safety to lock rollator breaks prior to standing and to push up from the bed prior to standing when pulling up pants. Pt completed grooming tasks standing at sink with close supervision. Pt able to manipulate toothpaste and toothpaste cap without dropping and utilize RUE to brush teeth and hair.   Pt reporting fatigue following BADLs and provided rest break seated EOB. Pt reported she was nervous about completing laundry tasks following d/c home. Pt provided towels and wash cloths to practice folding. Pt able to  fold 5 towels and 10 wash cloths with set up assist and increased time. Pt reported she was tired following activity and requested to lay down in bed. Pt was left in bed with bed alarm on, call bell in reach, and all needs met.   Therapy Documentation Precautions:  Precautions Precautions: Fall Precaution Comments: Decreased sensation R lower leg and foot. Restrictions Weight Bearing Restrictions: Yes General:   Pain: Pain Assessment Pain Scale: 0-10 Pain Score: 0-No pain ADL: ADL Eating: Supervision/safety Where Assessed-Eating: Edge of bed Grooming: Supervision/safety Where Assessed-Grooming: Standing at sink Upper Body Bathing: Supervision/safety Where Assessed-Upper Body Bathing: Shower Lower Body Bathing: Supervision/safety Where Assessed-Lower Body Bathing: Shower Upper Body Dressing: Supervision/safety Where Assessed-Upper Body Dressing: Edge of bed Lower Body Dressing: Supervision/safety Where Assessed-Lower Body Dressing: Edge of bed Toileting: Supervision/safety Where Assessed-Toileting: Glass blower/designer: Therapist, music Method: Counselling psychologist: Energy manager: Curator: Curator Method: Heritage manager: Radio broadcast assistant   Therapy/Group: Individual Therapy  Janey Genta 02/10/2022, 11:21 AM

## 2022-02-10 NOTE — Anesthesia Postprocedure Evaluation (Signed)
Anesthesia Post Note  Patient: Anita Fletcher  Procedure(s) Performed: RADIOLOGY WITH ANESTHESIA     Patient location during evaluation: PACU Anesthesia Type: General Level of consciousness: awake and alert Pain management: pain level controlled Vital Signs Assessment: post-procedure vital signs reviewed and stable Respiratory status: spontaneous breathing, nonlabored ventilation, respiratory function stable and patient connected to nasal cannula oxygen Cardiovascular status: blood pressure returned to baseline and stable Postop Assessment: no apparent nausea or vomiting Anesthetic complications: no   No notable events documented.  Last Vitals:  Vitals:   02/04/22 0734 02/04/22 1131  BP: (!) 167/93 125/68  Pulse: 64 64  Resp: 17 18  Temp: 36.7 C 36.7 C  SpO2: 97% 100%    Last Pain:  Vitals:   02/04/22 1409  TempSrc:   PainSc: 5                  Klaire [address] S

## 2022-02-11 ENCOUNTER — Inpatient Hospital Stay (HOSPITAL_COMMUNITY): Payer: Medicare HMO

## 2022-02-11 NOTE — Consult Note (Signed)
Neuropsychological Consultation   Patient:   Anita Fletcher   DOB:   Aug 02, 1945  MR Number:  [id number]  Location:  [facility] [address] CENTER A [address] [id number] [place] [address] Dept: [phone] Loc: [phone]           Date of Service:   02/10/2022  Start Time:   10 AM End Time:   11 AM  Provider/Observer:  Arley Phenix, Psy.D.       Clinical Neuropsychologist       Billing Code/Service: [phone]  Chief Complaint:    Anita Fletcher was referred for neuropsychological consultation due to ongoing difficulties following recent CVA in her current CIR admission due to functional decline.  Patient is a 76 year old female with past medical history including hypertension, TIA, PAF.  Patient was admitted on 01/27/2022 after having a 3-day long history of intermittent numbness in her right face, right hand and right lower extremity.  CT head done revealed patchy hypodensity bilaterally.  There were indications of P2 segment of basilar artery abnormalities and flow restriction.  MRI was not able to be done initially due to claustrophobia and panic attack history.  Patient was able to get MRI later with sedation.  MRI baring showed small acute infarcts in the left thalamus and posterior limb of the internal capsule.  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  Patient continued with right-sided weakness and ataxia but has improved but ongoing motor deficits continued and patient recommended for CIR due to functional decline.  Reason for Service:  Patient was referred for neuropsychological consultation due to coping and adjustment issues.  Below is the complete HPI for the current admission.  HPI: Anita Fletcher is a 76 year old  RH-female with history of HTN, TIA, PAF- on eliquis;  who was admitted on 01/27/22  with 3 day history of intermittent numbness right face, right hand and RLE. She was found to have elevated BP SBP 106 to 206 and HR in 50's. 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 MRI not done due to history of claustrophobia and panic attack She was transferred to [facility] on 10/21 for sedation prior to MRI and she was found to have right hemiataxia with right hypoesthesia and Jacksonian march type dysthesia of RLE followed by RUE followed by face. Dr. 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     MRA brain done revealing sever atherosclerotic disease with severe stenosis at proximal BA and severe stenosis with severe stenosis at left P2/PCA segment. MRI brain showed small acute infarcts left thalamus and posterior limb of internal capsule She underwent cerebral angiogram revealing extreme tortuosity of major neck arteries, 85% stenosis of proximal BA and ulcerated plaque in left carotid bulb. She underwent angioplasty with stenting of BA  with complete resolution of stenosis 11/03 by Dr. Lonzo Candy Rogrigues Post procedure, patient found to have left facial droop with bidirectional horizontal nystagmus, decrease in left eye closure and RUE/RLE drift concerning for bran stem infarct.  She required BIPAP for acute respiratory insufficiency. CT head done revealing interval development of small acute left cerebellar infarcts and  no bleed.     She was started on IV heparin as well as cleviprex for BP control.  Eliquis resumed 11/05, ASA d/c and  cleviprex weaned off. Respiratory status improved and weaned off oxygen. Right sided weakness and ataxia noted to be improving but she continues to be limited by nystagmus, dysmetria with  hemiplegia, sensory deficits on the left, ataxia with tendency to veer to the right as well as needing diet modification to D3 diet due to numbness right facial weakness with occasional oral residue post swallow. Therapy has been working with patient and CIR recommended due to functional decline.   Current Status:  Patient was awake and alert with adequate orientation and cognition and good mental status overall.  Patient was a good historian and provided accurate information about what it happened to her with regard to her CVA as well as good recall for recent therapeutic interventions.  Patient with primary motor and sensory motor deficits.  Patient did complain of some pain in her right hand and elbow which are likely directly related to her recent stroke.  Patient identified balance and motor deficits.  Deficits are consistent with a recent cerebrovascular event.  Patient reports that she has been very frustrated by her loss of function concern for how this will impact her independence going forward.  Behavioral Observation: Anita Fletcher  presents as a 76 y.o.-year-old Right handed Caucasian Female who appeared her stated age. her dress was Appropriate and she was Well Groomed and her manners were Appropriate to the situation.  her participation was indicative of Appropriate and Attentive behaviors.  There were physical disabilities noted.  she displayed an appropriate level of cooperation and motivation.     Interactions:    Active Appropriate  Attention:   within normal limits and attention span and concentration were age appropriate  Memory:   within normal limits; recent and remote memory intact  Visuo-spatial:  not examined  Speech (Volume):  normal  Speech:   normal; normal  Thought Process:  Coherent and Relevant  Though Content:  WNL; not suicidal and not homicidal  Orientation:   person, place, time/date, and  situation  Judgment:   Good  Planning:   Good  Affect:    Appropriate  Mood:    Anxious and Dysphoric  Insight:   Good  Intelligence:   high  Medical History:   Past Medical History:  Diagnosis Date   Acute CVA (cerebrovascular accident) (HCC) 01/27/2022   HTN (hypertension) 01/27/2022   PAF (paroxysmal atrial fibrillation) (HCC)    TIA (transient ischemic attack)          Patient Active Problem List   Diagnosis Date Noted   Left thalamic infarction (HCC) 02/04/2022   Intracranial atherosclerosis 01/31/2022   Acute CVA (cerebrovascular accident) (HCC) 01/27/2022   Paroxysmal atrial fibrillation (HCC) 01/27/2022   HTN (hypertension) 01/27/2022     Psychiatric History:  No prior psychiatric history  Family Med/Psych History:  Family History  Problem Relation Age of Onset   Alcoholism Father    Arrhythmia Brother    Diabetes Brother    Mental illness Brother     Impression/DX:  Anita Fletcher was referred for neuropsychological consultation due to ongoing difficulties following recent CVA in her current CIR admission due to functional decline.  Patient is a 76 year old female with past medical history including hypertension, TIA, PAF.  Patient was admitted on 01/27/2022 after having a 3-day long history of intermittent numbness in her right face, right  hand and right lower extremity.  CT head done revealed patchy hypodensity bilaterally.  There were indications of P2 segment of basilar artery abnormalities and flow restriction.  MRI was not able to be done initially due to claustrophobia and panic attack history.  Patient was able to get MRI later with sedation.  MRI baring showed small acute infarcts in the left thalamus and posterior limb of the internal capsule.  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  Patient continued with right-sided weakness and  ataxia but has improved but ongoing motor deficits continued and patient recommended for CIR due to functional decline.  Patient was awake and alert with adequate orientation and cognition and good mental status overall.  Patient was a good historian and provided accurate information about what it happened to her with regard to her CVA as well as good recall for recent therapeutic interventions.  Patient with primary motor and sensory motor deficits.  Patient did complain of some pain in her right hand and elbow which are likely directly related to her recent stroke.  Patient identified balance and motor deficits.  Deficits are consistent with a recent cerebrovascular event.  Patient reports that she has been very frustrated by her loss of function concern for how this will impact her independence going forward.  Disposition/Plan:  Today we focused primarily on coping and adjustment issues with extended hospital stay and continued ongoing neurological deficits with progressive improvement.  Patient is actively working in the therapeutic efforts and remains motivated for continued improvement.           Electronically Signed   _______________________ Arley Phenix, Psy.D. Clinical Neuropsychologist

## 2022-02-11 NOTE — Progress Notes (Addendum)
PROGRESS NOTE   Subjective/Complaints:  RIght knee pain, no trauma, has been doing PT,  no hx of knee surgery Still with numbness on entire Right side  Right elbow feeling better   Review of Systems  Constitutional:  Negative for chills and fever.  HENT:  Positive for congestion.   Eyes:  Negative for double vision.  Respiratory:  Negative for shortness of breath.   Cardiovascular:  Negative for chest pain and palpitations.  Gastrointestinal:  Negative for abdominal pain, constipation, diarrhea, nausea and vomiting.  Genitourinary:  Negative for dysuria.  Neurological:  Positive for tingling and sensory change. Negative for speech change.    Objective:   No results found. Recent Labs    02/10/22 0510  WBC 7.8  HGB 13.5  HCT 39.7  PLT 268    Recent Labs    02/10/22 0510  NA 140  K 3.4*  CL 110  CO2 20*  GLUCOSE 97  BUN 16  CREATININE 0.64  CALCIUM 8.6*     Intake/Output Summary (Last 24 hours) at 02/11/2022 0815 Last data filed at 02/10/2022 1756 Gross per 24 hour  Intake 710 ml  Output --  Net 710 ml         Physical Exam: Vital Signs Blood pressure (!) 148/77, pulse 65, temperature 97.7 F (36.5 C), temperature source Oral, resp. rate 20, height 5' 3.5" (1.613 m), weight 71.7 kg, SpO2 97 %.   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 No joint swelling, -tenderness RIght lateral epicondyle  No tenderness over Right knee , no effusion, + crepitus Right patella    Assessment/Plan: 1. Functional deficits which require 3+ hours per day of interdisciplinary therapy in a comprehensive inpatient rehab setting. Physiatrist is providing close team supervision and 24 hour management of active medical problems listed below. Physiatrist and rehab team continue to assess barriers to discharge/monitor patient progress toward functional and medical goals  Care Tool:  Bathing    Body parts bathed by patient: Right arm, Abdomen, Front perineal area, Right lower leg, Chest, Left arm, Left upper leg, Right upper leg, Buttocks, Left lower leg, Face         Bathing assist Assist Level: Supervision/Verbal cueing     Upper Body Dressing/Undressing Upper body dressing   What is the patient wearing?: Pull over shirt    Upper body assist  Assist Level: Independent with assistive device    Lower Body Dressing/Undressing Lower body dressing      What is the patient wearing?: Underwear/pull up, Pants     Lower body assist Assist for lower body dressing: Independent with assitive device     Toileting Toileting    Toileting assist Assist for toileting: Contact Guard/Touching assist     Transfers Chair/bed transfer  Transfers assist     Chair/bed transfer assist level: Contact Guard/Touching assist     Locomotion Ambulation   Ambulation assist      Assist level: Minimal Assistance - Patient > 75% Assistive device: No Device Max distance: 158ft   Walk 10 feet activity   Assist     Assist  level: Contact Guard/Touching assist Assistive device: No Device   Walk 50 feet activity   Assist    Assist level: Minimal Assistance - Patient > 75% Assistive device: No Device    Walk 150 feet activity   Assist Walk 150 feet activity did not occur: Safety/medical concerns         Walk 10 feet on uneven surface  activity   Assist     Assist level: Minimal Assistance - Patient > 75% Assistive device: Other (comment) (wall hand railing on L side)   Wheelchair     Assist Is the patient using a wheelchair?: Yes (for transport to and from gym for time management.) Type of Wheelchair: Manual    Wheelchair assist level: Dependent - Patient 0%      Wheelchair 50 feet with 2 turns activity    Assist        Assist Level: Dependent - Patient 0%   Wheelchair 150 feet activity     Assist      Assist Level: Dependent - Patient 0%   Blood pressure (!) 148/77, pulse 65, temperature 97.7 F (36.5 C), temperature source Oral, resp. rate 20, height 5' 3.5" (1.613 m), weight 71.7 kg, SpO2 97 %.  Medical Problem List and Plan: 1. Functional deficits secondary to left posterior limb internal capsule, thalamic infarcts and subsequent left cerebellar infarcts RIght hemisensory deficit , left CN 7 palsy              -patient may shower             -ELOS/Goals: 10-12 days, mod I to supervision with PT, OT, SLP, team conf in am   -Continue CIR with PT/OT/SLP 2.  Antithrombotics: -DVT/anticoagulation:  Pharmaceutical: Eliquis             -antiplatelet therapy: Brilinta 3. Pain Management: Tylenol prn.   -Consider gabapentin for neuropathic pain if numbness sensation becomes painful Xray RIght knee unremarkable will cont voltaren gel, tylenol, add knee sleeve 4. Mood/Behavior/Sleep: LCSW to follow for evaluation and support.              -antipsychotic agents: N/A             -pt with chronic anxiety disorder                         -xanax prn                          -pt was appropriate on exam today 5. Neuropsych/cognition: This patient is capable of making decisions on her own behalf. 6. Skin/Wound Care: Routine pressure relief measures.  7. Fluids/Electrolytes/Nutrition: Monitor I/O. Check CMET in am 8. HTN: Monitor  BP TID- continues to be labile.  --Continue Amlodipine and Cozaar.  Vitals:   02/10/22 2008 02/11/22 0551  BP: 125/69 (!) 148/77  Pulse: 70 65  Resp: 18 20  Temp: 97.6 F (36.4 C) 97.7 F (36.5 C)  SpO2: 99% 97%  Some lability monitor prior to dosing changes -11/13 Continue to have lability, Has been well controlled today, continue to monitor trend 9. PAF: Monitor HR TID--continue Eliquis- rate is controlled  10. Intermittent hypokalemia: Recheck CMET in am.   -K+ 3.6 on 02/05/2022-stable, continue to monitor with routine labs  -K+ 3.4 today, will give k+ Today and start daily 11. Constipation: Continue Senna S bid.   -11/13 BM today, continue to monitor 12. Nasal Congestion likely due to seasonal allergies: Will add nasocort to help with congestion/HA.   -11/9 Start Claritin for allergies.  She used Careers adviser at home.  -11/10 improved today, continue claritin   13.  RIght elbow pain lateral epicondylitis trial voltaren  14. Insomnia  -Trazodone PRN  -11/13 Start melatonin HS   LOS: 7 days A FACE TO FACE EVALUATION WAS PERFORMED  Erick Colace 02/11/2022, 8:15 AM

## 2022-02-11 NOTE — Progress Notes (Signed)
Occupational Therapy Session Note  Patient Details  Name: Anita Fletcher MRN: [id number] Date of Birth: Oct 26, 1945  Today's Date: 02/11/2022 OT Individual Time: 6924-9324 OT Individual Time Calculation (min): 31 min    Short Term Goals: Week 1:  OT Short Term Goal 1 (Week 1): STG=LTG d/t pt length of stay  Skilled Therapeutic Interventions/Progress Updates:  Pt greeted supine in bed, pt agreeable to OT intervention. Pt completed supine>sit MODI. Pt reports pain in R knee but per xray no fracture noted and pt agreeable to ambulate during session with rollator ( rest breaks provided as needed for pain mgmt) , pt completed functional ambulation from room to gym with rollator with supervision.   The Dynamometer Grip Strength Test is a quantitative and objective measure of isometric muscular strength of the hand and forearm.  -Instructions The patient was asked to sit with their back, pelvis, and knees at 90 degrees. The shoulder was adducted and neutrally rotated with The elbow flexed to 90 degrees and forearm in neutral. The arm was not supported.  -Results The score was determined by calculating the average of 3 trials. The pt's average score was 40  lbs in the R hand and 40  lbs in the L hand.   Female Average in lbs 55-59 R 57.3 L 47.3 60-64 R 55.1 L 45.7 65-69 R 49.6 L 41.0 70-74 R 49.6 L 41.5 75+ R 42.6 L 37.6  Worked on NMR in RUE via WB'ing at hi lo table with pt instructed to reach to R side of table to stack cones/ hang horseshoes with an emphasis on shifting weight onto RUE. Pt completed task with CGA with MOD cues for set- up and technique.   Ended session with pt supine in bed with all needs with all needs within reach.    Therapy Documentation Precautions:  Precautions Precautions: Fall Precaution Comments: Decreased sensation R lower leg and foot. Restrictions Weight Bearing Restrictions: Yes     Therapy/Group: Individual Therapy  Barron Schmid 02/11/2022, 3:52 PM

## 2022-02-11 NOTE — Progress Notes (Signed)
Occupational Therapy Session Note  Patient Details  Name: Anita Fletcher MRN: [id number] Date of Birth: [birth year]  Today's Date: 02/11/2022 OT Individual Time: 1287-8676 OT Individual Time Calculation (min): 45 min    Short Term Goals: Week 1:  OT Short Term Goal 1 (Week 1): STG=LTG d/t pt length of stay  Skilled Therapeutic Interventions/Progress Updates:     Pt received resting in bed in good spirits and motivated to participate in skilled OT session. Pt reporting 0/10 pain.   Self care/home management: Pt supine>sitting EOB mod I. Pt ambulated to sink with rollator to complete grooming tasks. Pt able to wash her face, brush her teeth, and brush her hair with supervision standing at sink. Pt ambulated to bathroom and completed 3/3 toileting tasks with supervision and washed her hands standing at the sink.   Pt participated in simulated laundry tasks in preparation for d/c home. Pt ambulated within her room using her rollator while gathering dirty laundry and placing it in dirty clothes bag. Pt educated on safety with IADL tasks when using rollator with good carry over during session. Pt required min verbal cues to lock breaks and for AD management.   Therapeutic Activity: Pt transported to therapy gym in wc total A. Pt completed card matching task on vertical mirror seated EOM to facilitate shifting of cards and [facility]. Pt was able to manipulate and shift cards in R hand to find correct card in deck and place it on mirror without dropping. Pt was transported back to room in wc total A and left resting in bed with bed alarm on, call bell in reach, and all needs met.   Therapy Documentation Precautions:  Precautions Precautions: Fall Precaution Comments: Decreased sensation R lower leg and foot. Restrictions Weight Bearing Restrictions: Yes General:    ADL: ADL Eating: Supervision/safety Where Assessed-Eating: Edge of bed Grooming: Supervision/safety Where Assessed-Grooming:  Standing at sink Upper Body Bathing: Supervision/safety Where Assessed-Upper Body Bathing: Shower Lower Body Bathing: Supervision/safety Where Assessed-Lower Body Bathing: Shower Upper Body Dressing: Supervision/safety Where Assessed-Upper Body Dressing: Edge of bed Lower Body Dressing: Supervision/safety Where Assessed-Lower Body Dressing: Edge of bed Toileting: Supervision/safety Where Assessed-Toileting: Glass blower/designer: Therapist, music Method: Counselling psychologist: Energy manager: Curator: Curator Method: Heritage manager: Radio broadcast assistant   Therapy/Group: Individual Therapy  Janey Genta 02/11/2022, 12:06 PM

## 2022-02-11 NOTE — Progress Notes (Signed)
Physical Therapy Session Note  Patient Details  Name: Anita Fletcher MRN: [id number] Date of Birth: 05/03/1945  Today's Date: 02/11/2022 PT Individual Time: 9675-9163 and 1630 -1727 PT Individual Time Calculation (min): 68 min and 57 min  Short Term Goals: Week 1:  PT Short Term Goal 1 (Week 1): = to LTG's based on ELOS  Skilled Therapeutic Interventions/Progress Updates:     Session 1:  Pt greeted supine in bed and agreeable to therapy. Pt c/o increased R knee pain, 8/10 at rest and with movement. MD and nurse are aware and pt was given tylenol and voltaren gel ~1 hour ago. MD stated he may order a knee sleeve.   Pt able to don L shoe with set up assist, but needs mod assist for right shoe. Stand pivot transfer with no AD from EOB to WC with mod I.   Pt transported to main therapy gym for time management and R knee pain.  Applied moist hot pack to right knee for 15 min for pain management while pt performed a seated reaching tasks involving reaching to the right and left and first placing squigs on mirror and then pulling squigs off mirror. Pt able to grasp and pull well with right hand with accurate reaching for squigs.   After completion of 15 minutes of moist heat the skin was checked with no abnormalities found and pt reporting a slight decrease in pain.   Seated therex performed on R LE:  -Ankle pumps x 20  -LAQ x 20 - pt reporting pain with full knee extension and pt was cued modify motion to only pain free range.  -LAQ with isometric hold x5seconds given by SPT x 20 reps.   Gait training:  Reviewed rollator brake use prior to mobility and pt verbalized and demonstrated understanding.  119f, 2135f [id number] progressing to SPV and using Rollator. Seated rest breaks taken between sets. Gait training involved multiple turns, obstacle navigation, and amb on both tile and carpeted floor.  Pt showing improved step and stride length compared to at time of IE. Pt showing  very minimal ataxic gait on R LE and demonstrating good heel strike and gait speed as well. Cues given for upright posture   Sit<>[facility] throughout session with mod I and no cuing needed for hand placement or sequencing. X1 sit<>stand from recliner in simulation   Practiced sequencing and technique for safely sitting in Rollator including parking up against a wall for increased stability, break use and sit<> stand technique. Pt performed well with cuing.   Stair training:  Began with step taps 2x10 each LE on 8 inch step to assess pain in R knee. L UE support on hand rail and SPV by SPT. Pt reported no increased pain. Progressed to stair navigation of 4 (6inch) steps x 3 with B HR support and CGA progressing to SPV. First set pt performed with a reciprocal pattern, last two sets performed with step to pattern as pt stated that is what she usually did at home. Pt showed good foot and hand placement, speed, and balance.   Pt amb back to room with Rollator and SPV 150ft.65fh same pattern and cues as above.   Pt left seated EOB with bed alarm on, call bell in reach, all needs met. Pt reporting 0/10 pain in her right knee upon completion of session.   Session 2:  Pt greeted seated EOB and agreeable to therapy. She reports her right knee  is feeling much better and is at a 3/10 pain right now.   Sit>stand from EOB to rollator with mod I.   Gait training:  221f, 828f [id number] Rollator and SPV. Seated rest breaks taken between all sets. Pt presenting with slightly increased stride length with R LE and slightly decreased stance time on L LE. Pt maintained good upright posture and managed Rollator brakes and steering well with minimal cuing needed to remind pt to lock brakes prior to sitting down. Pt does well setting brakes prior to standing up, when prepping to sit, and when standing to perform a functional dynamic task.   Assisted pt with problem solving how to  clean up after her dog safely. PTA pt would get down on the floor and wipe it up. She no longer feels safe doing that. After discussion came to the conclusion that she will use her Swiffer mop to clean up and bend over to pick items up off the floor while holding onto the Rollator.  Simulated this in the gym by placing colorful circles on the floor throughout the room and using a real Swiffer from apartment simulation room. Pt would walk up to colorful disc until it was slightly in front of her feet, take mop off Rollator, simulate mopping, replace mop across Rollator, and bend down to pick up disc. This was repeated x 10 with SPV. Pt managed Rollator well with appropriate brake setting and placement. No c/o increased knee pain.   Stair negotiation:  12 steps (~6in) x 3 with L UE support on L HR with ascend and R UE on L HR with descend, CGA on first set and SPV with remaining sets. Seated rest breaks taken between sets. Step to pattern with R foot leading when ascending all 3 sets. L foot leading for descending for first 2 sets due to pt reporting that leading with the R LE hurt the R knee. However pt led with the R LE on the last set with no c/o pain. Cues given to pt to make sure R foot is fully on the step with descending due to pt occasionally having toes and forefoot off the step due to decreased sensation.   Further practice of dynamic standing balance tasks challenged in kitchen such as opening and closing and reaching into refrigerator while managing Rollator. Pt performed well with good Rollator management and balance.   Pt left seated EOB with bed alarm on, call bell in reach, dinner set up, all needs met.   Therapy Documentation Precautions:  Precautions Precautions: Fall Precaution Comments: Decreased sensation R lower leg and foot. Restrictions Weight Bearing Restrictions: Yes   Therapy/Group: Individual Therapy  Anita Fletcher Anita Fletcher 02/11/2022, 8:15 AM

## 2022-02-12 MED ORDER — GABAPENTIN 100 MG PO CAPS
100.0000 mg | ORAL_CAPSULE | Freq: Every day | ORAL | Status: DC
  Administered 2022-02-12: 100 mg via ORAL
  Filled 2022-02-12: qty 1

## 2022-02-12 NOTE — Progress Notes (Signed)
Orthopedic Tech Progress Note Patient Details:  Anita Fletcher [birth year] [id number]  Ortho Devices Type of Ortho Device: Knee Sleeve Ortho Device/Splint Location: RLE Ortho Device/Splint Interventions: Ordered, Application, Adjustment, Removal   Post Interventions Patient Tolerated: Well Instructions Provided: Care of device  [name] 02/12/2022, 5:24 PM

## 2022-02-12 NOTE — Progress Notes (Signed)
Physical Therapy Session Note  Patient Details  Name: Anita Fletcher MRN: [id number] Date of Birth: 1945/04/26  Today's Date: 02/12/2022 PT Individual Time: 3382-5053 PT Individual Time Calculation (min): 55 min   Short Term Goals: Week 1:  PT Short Term Goal 1 (Week 1): = to LTG's based on ELOS  Skilled Therapeutic Interventions/Progress Updates:     Pt greeted seated EOB and agreeable to therapy. Continues to have 3/10 discomfort from numbness and tingling in her right hand and foot. Nursing aware.   Pt amb 352ft to gym with Rollator and SPV. No cuing needed with good step and stride length, heel strike, and gait speed.   Sit<>stand from mat table and standard chair to rollator throughout session with mod I and no cues needed for correction.   Dynamic balance tasks performed including:  -Four square stepping, CW x 5 and CCW x 5. Majority CGA with pt being unsteady throughout, but only one LOB requiring min assist for recovery when pt side stepped to the left.  -Sit<>stand with airex under L foot to increase demand on R LE with CGA progressing to SPV. Pt performed x10 with no weight, x10 holding a 3# ball, and x10 holding a 4# ball. Seated rest breaks in between sets.  -ball toss while standing on airex pad with CGA. First 5 tosses were directly to patient and last 15 were in all directions reaching outside of BOS. Pt showed good reaction time and coordination.  -Biodex balance machine- limits of stability performed x 2 with first set pt having B UE support and second set having no UE support. Increased difficulty with no UE support.   Amb 310ft x 2 with Rollator and seated rest break in between. Same gait pattern as mentioned above.   Educated pt on best way to transport items from room to room using Rollator basket and seat as well as use of backpack when transporting items between first level and second level. Pt verbalized understanding.   Pt left seated EOB, call bell in reach, pt  now mod I in room.   Therapy Documentation Precautions:  Precautions Precautions: Fall Precaution Comments: Decreased sensation R lower leg , foot, and hand Restrictions Weight Bearing Restrictions: No  Therapy/Group: Individual Therapy  Valarie Merino, SPT 02/12/2022, 3:30 PM

## 2022-02-12 NOTE — Progress Notes (Signed)
Physical Therapy Discharge Summary  Patient Details  Name: Anita Fletcher MRN: [id number] Date of Birth: Sep 09, 1945  Date of Discharge from PT service:February 12, 2022  Today's Date: 02/12/2022 PT Individual Time: 0805-0913 PT Individual Time Calculation (min): 68 min    Patient has met 7 of 7 long term goals due to improved activity tolerance, improved balance, improved postural control, increased strength, functional use of  right lower extremity, and improved coordination.  Patient to discharge at an ambulatory level  SPV for stairs, car transfers, and Community amb with the Rollator, and mod I with bed mobility, household ambulation with Rollator and chair transfers. .   Patient's care partner  is able  to provide the necessary  SPV  assistance at discharge.  All goals met.   Recommendation:  Patient will benefit from ongoing skilled PT services in home health setting to continue to advance safe functional mobility, address ongoing impairments in dynamic balance, endurance, gait training, stair training, LE strengthening, and minimize fall risk.  Equipment: Rollator  Reasons for discharge: treatment goals met and discharge from hospital  Patient/family agrees with progress made and goals achieved: Yes   Skilled Therapeutic Interventions/Progress Updates:  Pt greeted seated EOB and agreeable to therapy. Pt reporting no current R knee pain, but 3/10 pain of numbness and tingling in the R hand and foot.  Sit<>stand from EOB to Rollator with mod I and cues to lock breaks before sitting.   Gait and transfer training:  Pt amb ~1021f down to lobby of hospital with Rollator and SPV and was able to navigate elevator with no LOB, transfer sit <>stand from multiple seats in lobby with Rollator and SPV.   Stair training:  Discussed placement of Rollators at bottom and top of stairs for home set up and safety.  Pt able to ascend and descend 12 steps, 6 inches in height, with SPV in a step  to pattern. Right foot leading with ascending, and pt switching between right foot and left foot leading with descending. Pt using her L UE on L HR ascending and R UE on L HR descending to simulate home set up.   [facility] risk as noted by score of 43/56.  (<36= high risk for falls, close to 100%; 37-45 significant >80%; 46-51 moderate >50%; 52-55 lower >25%).   Pt left sitting EOB with bed alarm on, call bell in reach, all needs met.   PT Discharge Precautions/Restrictions Precautions Precautions: Fall Precaution Comments: Decreased sensation R lower leg and foot. Restrictions Weight Bearing Restrictions: No Pain Pain Assessment Pain Scale: 0-10 Pain Score: 3  Pain Type: Neuropathic pain Pain Location: Hand Pain Orientation: Right Pain Descriptors / Indicators: Numbness;Tingling Pain Onset: On-going Patients Stated Pain Goal: 0 Pain Intervention(s): Medication (See eMAR) Multiple Pain Sites: Yes 2nd Pain Site Pain Score: 3 Pain Type: Neuropathic pain Pain Location: Foot Pain Orientation: Right Pain Descriptors / Indicators: Numbness;Tingling Pain Onset: On-going Patient's Stated Pain Goal: 0 Pain Intervention(s): Medication (See eMAR) Pain Interference Pain Interference Pain Effect on Sleep: 2. Occasionally Pain Interference with Therapy Activities: 1. Rarely or not at all Pain Interference with Day-to-Day Activities: 1. Rarely or not at all Vision/Perception  Vision - History Ability to See in Adequate Light: 0 Adequate Vision - Assessment Eye Alignment: Within Functional Limits Alignment/Gaze Preference: Within Defined Limits Perception Perception: Within Functional Limits Praxis Praxis: Intact  Cognition Overall Cognitive Status: Within Functional Limits for tasks assessed Arousal/Alertness: Awake/alert Orientation  Level: Oriented X4 Year: 2023 Month: November Day of Week: Correct Memory:  Appears intact Awareness: Appears intact Problem Solving: Appears intact Safety/Judgment: Appears intact Sensation Sensation Light Touch: Impaired by gross assessment Hot/Cold: Not tested Proprioception: Not tested Stereognosis: Not tested Additional Comments: Decreased sensation throughout R LE from mid thigh and down, sensation more diminished lower shin and foot. Coordination Gross Motor Movements are Fluid and Coordinated: No Coordination and Movement Description: much improved from initial evaluation, but still slight overshooting or undershooting with R LE during gait and stair negotiation. Motor  Motor Motor: Hemiplegia;Ataxia (R hemiparesis) Motor - Discharge Observations: over or under stepping of the R LE occasionally with ambulation and stair negotiation, improved from evaulation  Mobility Bed Mobility Bed Mobility: Sit to Supine;Supine to Sit Supine to Sit: Independent with assistive device Sit to Supine: Independent with assistive device Transfers Transfers: Sit to Stand;Stand to Sit;Stand Pivot Transfers Sit to Stand: Independent with assistive device (Rollator) Stand to Sit: Independent with assistive device Agricultural consultant) Stand Pivot Transfers: Independent with assistive device Agricultural consultant) Transfer (Assistive device): Rollator Locomotion  Gait Ambulation: Yes Gait Assistance: Supervision/Verbal cueing;Independent with assistive device (mod i with household amb and SPV with community amb) Gait Distance (Feet): 500 Feet Assistive device: Rollator Gait Assistance Details: Verbal cues for safe use of DME/AE;Verbal cues for gait pattern Gait Assistance Details: minimal cues for equal step length and minimal cues to lock Rollator brakes when going from stand>sit. Gait Gait: Yes Gait Pattern: Impaired Gait Pattern: Ataxic;Decreased step length - left;Decreased stance time - left Gait velocity: decreased from baseline per pt, but improved from evaluation. Stairs /  Additional Locomotion Stairs: Yes Stairs Assistance: Supervision/Verbal cueing Stair Management Technique: One rail Left;Forwards;Step to pattern Number of Stairs: 12 Height of Stairs: 6 Ramp: Supervision/Verbal cueing Curb: Supervision/Verbal cueing Wheelchair Mobility Wheelchair Mobility: No  Trunk/Postural Assessment  Cervical Assessment Cervical Assessment: Within Functional Limits Thoracic Assessment Thoracic Assessment: Within Functional Limits Lumbar Assessment Lumbar Assessment: Within Functional Limits Postural Control Postural Control: Within Functional Limits  Balance Balance Balance Assessed: Yes Standardized Balance Assessment Standardized Balance Assessment: Berg Balance Test Berg Balance Test Sit to Stand: Able to stand without using hands and stabilize independently Standing Unsupported: Able to stand safely 2 minutes Sitting with Back Unsupported but Feet Supported on Floor or Stool: Able to sit safely and securely 2 minutes Stand to Sit: Sits safely with minimal use of hands Transfers: Able to transfer safely, definite need of hands Standing Unsupported with Eyes Closed: Able to stand 10 seconds with supervision Standing Ubsupported with Feet Together: Able to place feet together independently and stand for 1 minute with supervision From Standing, Reach Forward with Outstretched Arm: Can reach forward >12 cm safely (5") From Standing Position, Pick up Object from Floor: Able to pick up shoe, needs supervision From Standing Position, Turn to Look Behind Over each Shoulder: Looks behind one side only/other side shows less weight shift Turn 360 Degrees: Able to turn 360 degrees safely one side only in 4 seconds or less Standing Unsupported, Alternately Place Feet on Step/Stool: Able to complete 4 steps without aid or supervision Standing Unsupported, One Foot in Front: Able to plae foot ahead of the other independently and hold 30 seconds Standing on One Leg:  Tries to lift leg/unable to hold 3 seconds but remains standing independently Total Score: 43 Static Sitting Balance Static Sitting - Balance Support: Feet supported Static Sitting - Level of Assistance: 6: Modified independent (Device/Increase time) Dynamic Sitting Balance Dynamic Sitting - Balance  Support: Feet supported;During functional activity Dynamic Sitting - Level of Assistance: 6: Modified independent (Device/Increase time) Dynamic Sitting - Balance Activities: Forward lean/weight shifting Static Standing Balance Static Standing - Balance Support: During functional activity;Bilateral upper extremity supported Static Standing - Level of Assistance: 6: Modified independent (Device/Increase time) (SPV) Dynamic Standing Balance Dynamic Standing - Balance Support: During functional activity;Bilateral upper extremity supported Dynamic Standing - Level of Assistance: 6: Modified independent (Device/Increase time) (SPV) Extremity Assessment      RLE Assessment RLE Assessment: Exceptions to [facility] General Strength Comments: tested grossly in sitting RLE Strength Right Hip Flexion: 4/5 Right Knee Flexion: 4/5 Right Knee Extension: 4+/5 Right Ankle Dorsiflexion: 4+/5 Right Ankle Plantar Flexion: 4+/5 LLE Assessment LLE Assessment: Within Functional Limits General Strength Comments: tested grossly in sitting   [name], SPT 02/12/2022, 7:41 AM

## 2022-02-12 NOTE — Progress Notes (Signed)
Occupational Therapy Discharge Summary  Patient Details  Name: Anita Fletcher MRN: [id number] Date of Birth: 1945-12-29  Date of Discharge from OT service:February 12, 2022  Today's Date: 02/12/2022 OT Individual Time: 1300-1415 OT Individual Time Calculation (min): 75 min   Patient has met 10 of 10 long term goals due to improved activity tolerance, improved balance, ability to compensate for deficits, functional use of  RIGHT upper and RIGHT lower extremity, improved awareness, and improved coordination.  Patient to discharge at overall Modified Independent level. Patient's care partner is independent to provide the necessary physical and cognitive assistance at discharge.   Reasons goals not met: All goals met   Recommendation:  Patient will benefit from ongoing skilled OT services in home health setting to continue to advance functional skills in the area of BADL, iADL, and Reduce care partner burden.   Equipment: No equipment provided  Reasons for discharge: treatment goals met and discharge from hospital  Patient/family agrees with progress made and goals achieved: Yes  Skilled Therapeutic Interventions/Progress Updates: Pt received resting in bed easy to wake and motivated to participate in skilled therapy session.   BADLs: Pt practiced functional mobility within her room gathering clothes from her closet and putting away dirty clothes with rollator mod I. Pt demonstrating good safety awareness- pacing activity, sitting to put away clothes, and locking rollator breaks. Pt completed U/LB bathing seated on tub bench with mod I and U/LB dressing seated EOB mod I. Pt completed grooming tasks standing at sink with rollator mod I.  Therapeutic Activity: Pt practiced simulated check writing tasks. Pt wrote her signature x5, printed her name x3, and wrote simulated checks x3. Pt initially demonstrated uncoordinated pen movements with noted improvement and increased control throughout task.  Pt educated on body mechanics and compensatory strategies for writing to support improvement in writing legibility.   OT Discharge Precautions/Restrictions  Precautions Precautions: Fall Precaution Comments: Decreased sensation R lower leg , foot, and hand Restrictions Weight Bearing Restrictions: No General   Pain Pain Assessment Pain Scale: 0-10 Pain Score: 2  Faces Pain Scale: Hurts a little bit Pain Type: Neuropathic pain Pain Location: Hand Pain Orientation: Right Pain Descriptors / Indicators: Numbness;Tingling Pain Onset: On-going Patients Stated Pain Goal: 0 Pain Intervention(s): Medication (See eMAR) Multiple Pain Sites: Yes 2nd Pain Site Pain Score: 3 Pain Type: Neuropathic pain Pain Location: Foot Pain Orientation: Right Pain Descriptors / Indicators: Numbness;Tingling Pain Onset: On-going Patient's Stated Pain Goal: 0 Pain Intervention(s): Medication (See eMAR) ADL ADL Eating: Modified independent Where Assessed-Eating: Edge of bed Grooming: Modified independent Where Assessed-Grooming: Standing at sink Upper Body Bathing: Modified independent Where Assessed-Upper Body Bathing: Shower Lower Body Bathing: Modified independent Where Assessed-Lower Body Bathing: Shower Upper Body Dressing: Modified independent (Device) Where Assessed-Upper Body Dressing: Edge of bed Lower Body Dressing: Modified independent Where Assessed-Lower Body Dressing: Edge of bed Toileting: Modified independent Where Assessed-Toileting: Glass blower/designer: Diplomatic Services operational officer Method: Counselling psychologist: Energy manager: Curator: Modified independent Social research officer, government Method: Heritage manager: Radio broadcast assistant, Grab bars Vision Baseline Vision/History: 1 Wears glasses Patient Visual Report: No change from baseline Vision Assessment?: No apparent visual deficits Eye  Alignment: Within Functional Limits Ocular Range of Motion: Within Functional Limits Alignment/Gaze Preference: Within Defined Limits Tracking/Visual Pursuits: Able to track stimulus in all quads without difficulty Saccades: Within functional limits Convergence: Within functional limits Visual Fields: No apparent deficits Perception  Perception: Within Functional Limits Praxis  Praxis: Intact Cognition Cognition Overall Cognitive Status: Within Functional Limits for tasks assessed Arousal/Alertness: Awake/alert Memory: Appears intact Attention: Focused;Sustained;Selective Focused Attention: Appears intact Sustained Attention: Appears intact Selective Attention: Appears intact Awareness: Appears intact Problem Solving: Appears intact Safety/Judgment: Appears intact Brief Interview for Mental Status (BIMS) Repetition of Three Words (First Attempt): 3 Temporal Orientation: Year: Correct Temporal Orientation: Month: Accurate within 5 days Temporal Orientation: Day: Correct Recall: "Sock": Yes, no cue required Recall: "Blue": Yes, no cue required Recall: "Bed": Yes, no cue required BIMS Summary Score: 15 Sensation Sensation Light Touch: Impaired by gross assessment Hot/Cold: Appears Intact Proprioception: Appears Intact Stereognosis: Impaired by gross assessment Additional Comments: Decreased sensation in ulnar side of hand, forearm, and upper arm Coordination Gross Motor Movements are Fluid and Coordinated: No Fine Motor Movements are Fluid and Coordinated: No Coordination and Movement Description: much improved from initial evaluation, but still slight overshooting or undershooting with R UE during ADLs and functional activities Finger Nose Finger Test: Very mild dysmetria noted with improvement since time of evaluation Motor  Motor Motor: Hemiplegia;Ataxia Motor - Skilled Clinical Observations: mild overshooting or undershooting during FM activities Motor - Discharge  Observations: mild overshooting or undershooting during RUE FM activities Mobility  Bed Mobility Bed Mobility: Sit to Supine;Supine to Sit Supine to Sit: Independent with assistive device Sit to Supine: Independent with assistive device Transfers Sit to Stand: Independent with assistive device Stand to Sit: Independent with assistive device  Trunk/Postural Assessment  Cervical Assessment Cervical Assessment: Within Functional Limits Thoracic Assessment Thoracic Assessment: Within Functional Limits Lumbar Assessment Lumbar Assessment: Within Functional Limits Postural Control Postural Control: Within Functional Limits  Balance Balance Balance Assessed: Yes Standardized Balance Assessment Standardized Balance Assessment: Berg Balance Test Berg Balance Test Sit to Stand: Able to stand without using hands and stabilize independently Standing Unsupported: Able to stand safely 2 minutes Sitting with Back Unsupported but Feet Supported on Floor or Stool: Able to sit safely and securely 2 minutes Stand to Sit: Sits safely with minimal use of hands Transfers: Able to transfer safely, definite need of hands Standing Unsupported with Eyes Closed: Able to stand 10 seconds with supervision Standing Ubsupported with Feet Together: Able to place feet together independently and stand for 1 minute with supervision From Standing, Reach Forward with Outstretched Arm: Can reach forward >12 cm safely (5") From Standing Position, Pick up Object from Floor: Able to pick up shoe, needs supervision From Standing Position, Turn to Look Behind Over each Shoulder: Looks behind one side only/other side shows less weight shift Turn 360 Degrees: Able to turn 360 degrees safely one side only in 4 seconds or less Standing Unsupported, Alternately Place Feet on Step/Stool: Able to complete 4 steps without aid or supervision Standing Unsupported, One Foot in Front: Able to plae foot ahead of the other independently  and hold 30 seconds Standing on One Leg: Tries to lift leg/unable to hold 3 seconds but remains standing independently Total Score: 43 Static Sitting Balance Static Sitting - Balance Support: Feet supported Static Sitting - Level of Assistance: 6: Modified independent (Device/Increase time) Dynamic Sitting Balance Dynamic Sitting - Balance Support: Feet supported;During functional activity Dynamic Sitting - Level of Assistance: 6: Modified independent (Device/Increase time) Dynamic Sitting - Balance Activities: Forward lean/weight shifting Static Standing Balance Static Standing - Balance Support: During functional activity Static Standing - Level of Assistance: 6: Modified independent (Device/Increase time) Dynamic Standing Balance Dynamic Standing - Balance Support: During functional activity Dynamic Standing - Level of Assistance: 6: Modified  independent (Device/Increase time) Extremity/Trunk Assessment RUE Assessment RUE Assessment: Exceptions to [facility] Active Range of Motion (AROM) Comments: WFL RUE Body System: Neuro RUE Strength RUE Overall Strength: Within Functional Limits for tasks performed RUE Overall Strength Comments: 5/5 strength in RUE; RUE=LUE strength Right Hand Gross Grasp: Functional LUE Assessment LUE Assessment: Within Functional Limits   Janey Genta 02/12/2022, 2:14 PM

## 2022-02-12 NOTE — Patient Care Conference (Signed)
Inpatient RehabilitationTeam Conference and Plan of Care Update Date: 02/12/2022   Time: 10:42 AM    Patient Name: Anita Fletcher      Medical Record Number: [id number]  Date of Birth: 05/08/1945 Sex: Female         Room/Bed: 4W20C/4W20C-01 Payor Info: Payor: [employer] MEDICARE / Plan: [employer] MEDICARE HMO / Product Type: *No Product type* /    Admit Date/Time:  02/04/2022  3:18 PM  Primary Diagnosis:  Left thalamic infarction [facility])  Hospital Problems: Principal Problem:   Left thalamic infarction [facility])    Expected Discharge Date: Expected Discharge Date: 02/13/22  Team Members Present: Physician leading conference: Dr. Claudette Laws Social Worker Present: Lavera Guise, BSW Nurse Present: Chana Bode, RN PT Present: Casimiro Needle, PT OT Present: Other (comment) Bonnell Public, OT) SLP Present: Eilene Ghazi, SLP PPS Coordinator present : Fae Pippin, SLP     Current Status/Progress Goal Weekly Team Focus  Bowel/Bladder      Continent          Swallow/Nutrition/ Hydration               ADL's   U/LB bahting and dressing supervision, supervision with transfers to toilet and tub bench; decreased sensation in RUE   mod I   ADL retraining, NMR, functional mobility, endurance    Mobility   mod i with bed mobility, SPV with sit to stand and bed to chair transfers, CGA progressing to SPV for ambulation up to 136ft with Rollator, stair training CGA progressing to SPV with 4 steps and 2 HR.   mod i/SPV at ambulatory level with LRAD  Gait training, stair training, transfer training, dynamic balance, R hemibody NMR, endurance.    Communication               Safety/Cognition/ Behavioral Observations               Pain      N/a          Skin      N/a           Discharge Planning:  Patient potentially discharging on Thurs   Team Discussion: Patient doing well overall; right knee better; xray unremarkable per MD. Right UE pain post left thalamic  infarct with neuropathic pain and tennis elbow with decreased sensation.   Patient on target to meet rehab goals: yes, currently mod I with ADLs and goals for discharge set for supervision overall.   *See Care Plan and progress notes for long and short-term goals.   Revisions to Treatment Plan:  N/a  Teaching Needs: Safety, transfers, medications, dietary modifications, etc   Current Barriers to Discharge: Decreased caregiver support  Possible Resolutions to Barriers: Family education HH follow up services; no transportation for OP DME: rollator     Medical Summary Current Status: BP less labile, Right nerve pain and sensory loss  Barriers to Discharge: Other (comments)  Barriers to Discharge Comments: on Eliquis and Brillinta, Possible Resolutions to [employer] Focus: monitor for bleeding, monitor Left eye for exposure keratitis   Continued Need for Acute Rehabilitation Level of Care: The patient requires daily medical management by a physician with specialized training in physical medicine and rehabilitation for the following reasons: Direction of a multidisciplinary physical rehabilitation program to maximize functional independence : Yes Medical management of patient stability for increased activity during participation in an intensive rehabilitation regime.: Yes Analysis of laboratory values and/or radiology reports with any subsequent need for medication adjustment and/or medical  intervention. : Yes   I attest that I was present, lead the team conference, and concur with the assessment and plan of the team.   Chana Bode B 02/12/2022, 2:14 PM

## 2022-02-12 NOTE — Progress Notes (Signed)
Patient ID: Anita Fletcher, female   DOB: 03/29/1946, 76 y.o.   MRN: [id number]  [facility] referral sent to [facility] for PT and OT.

## 2022-02-12 NOTE — Progress Notes (Signed)
Patient ID: Anita Fletcher, female   DOB: 02/19/1946, 76 y.o.   MRN: [id number]  Rollator ordered through [employer].

## 2022-02-12 NOTE — Plan of Care (Signed)
  Problem: Sit to Stand Goal: LTG:  Patient will perform sit to stand in prep for activites of daily living with assistance level (OT) Description: LTG:  Patient will perform sit to stand in prep for activites of daily living with assistance level (OT) Outcome: Completed/Met   Problem: RH Grooming Goal: LTG Patient will perform grooming w/assist,cues/equip (OT) Description: LTG: Patient will perform grooming with assist, with/without cues using equipment (OT) Outcome: Completed/Met   Problem: RH Bathing Goal: LTG Patient will bathe all body parts with assist levels (OT) Description: LTG: Patient will bathe all body parts with assist levels (OT) Outcome: Completed/Met   Problem: RH Dressing Goal: LTG Patient will perform upper body dressing (OT) Description: LTG Patient will perform upper body dressing with assist, with/without cues (OT). Outcome: Completed/Met   Problem: RH Dressing Goal: LTG Patient will perform lower body dressing w/assist (OT) Description: LTG: Patient will perform lower body dressing with assist, with/without cues in positioning using equipment (OT) Outcome: Completed/Met   Problem: RH Toileting Goal: LTG Patient will perform toileting task (3/3 steps) with assistance level (OT) Description: LTG: Patient will perform toileting task (3/3 steps) with assistance level (OT)  Outcome: Completed/Met   Problem: RH Simple Meal Prep Goal: LTG Patient will perform simple meal prep w/assist (OT) Description: LTG: Patient will perform simple meal prep with assistance, with/without cues (OT). Outcome: Completed/Met   Problem: RH Laundry Goal: LTG Patient will perform laundry w/assist, cues (OT) Description: LTG: Patient will perform laundry with assistance, with/without cues (OT). Outcome: Completed/Met   Problem: RH Light Housekeeping Goal: LTG Patient will perform light housekeeping w/assist (OT) Description: LTG: Patient will perform light housekeeping with  assistance, with/without cues (OT). Outcome: Completed/Met   Problem: RH Tub/Shower Transfers Goal: LTG Patient will perform tub/shower transfers w/assist (OT) Description: LTG: Patient will perform tub/shower transfers with assist, with/without cues using equipment (OT) Outcome: Completed/Met

## 2022-02-12 NOTE — Progress Notes (Signed)
PROGRESS NOTE   Subjective/Complaints:  No issues overnite except occ RUE pain, woke up at 4a but not in pain , R knee pain  better, discussed xrays  Review of Systems  Constitutional:  Negative for chills and fever.  HENT:  Positive for congestion.   Eyes:  Negative for double vision.  Respiratory:  Negative for shortness of breath.   Cardiovascular:  Negative for chest pain and palpitations.  Gastrointestinal:  Negative for abdominal pain, constipation, diarrhea, nausea and vomiting.  Genitourinary:  Negative for dysuria.  Neurological:  Positive for tingling and sensory change. Negative for speech change.    Objective:   DG Knee 1-2 Views Right  Result Date: 02/11/2022 CLINICAL DATA:  Right knee pain and swelling. EXAM: RIGHT KNEE - 1-2 VIEW COMPARISON:  None Available. FINDINGS: No evidence of fracture, dislocation, or joint effusion. No significant arthropathy. No bony lesions or destruction. Calcified plaque in the region of the distal SFA/proximal popliteal artery. IMPRESSION: No acute findings. Calcified plaque in the region of the distal SFA/proximal popliteal artery. Electronically Signed   By: Aletta Edouard M.D.   On: 02/11/2022 09:15   Recent Labs    02/10/22 0510  WBC 7.8  HGB 13.5  HCT 39.7  PLT 268    Recent Labs    02/10/22 0510  NA 140  K 3.4*  CL 110  CO2 20*  GLUCOSE 97  BUN 16  CREATININE 0.64  CALCIUM 8.6*     Intake/Output Summary (Last 24 hours) at 02/12/2022 0748 Last data filed at 02/12/2022 0700 Gross per 24 hour  Intake 837 ml  Output --  Net 837 ml         Physical Exam: Vital Signs Blood pressure (!) 151/86, pulse 60, temperature 98.1 F (36.7 C), temperature source Oral, resp. rate 18, height 5' 3.5" (1.613 m), weight 71.7 kg, SpO2 94 %.   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 No joint swelling, -tenderness RIght lateral epicondyle  No tenderness over Right knee , no effusion, + crepitus Right patella    Assessment/Plan: 1. Functional deficits which require 3+ hours per day of interdisciplinary therapy in a comprehensive inpatient rehab setting. Physiatrist is providing close team supervision and 24 hour management of active medical problems listed below. Physiatrist and rehab team continue to assess barriers to discharge/monitor patient progress toward functional and  medical goals  Care Tool:  Bathing    Body parts bathed by patient: Right arm, Abdomen, Front perineal area, Right lower leg, Chest, Left arm, Left upper leg, Right upper leg, Buttocks, Left lower leg, Face         Bathing assist Assist Level: Supervision/Verbal cueing     Upper Body Dressing/Undressing Upper body dressing   What is the patient wearing?: Pull over shirt    Upper body assist Assist Level: Independent with assistive device    Lower Body Dressing/Undressing Lower body dressing      What is the patient wearing?: Underwear/pull up, Pants     Lower body assist Assist for lower body  dressing: Independent with assitive device     Toileting Toileting    Toileting assist Assist for toileting: Contact Guard/Touching assist     Transfers Chair/bed transfer  Transfers assist     Chair/bed transfer assist level: Independent with assistive device     Locomotion Ambulation   Ambulation assist      Assist level: Supervision/Verbal cueing Assistive device: Rollator Max distance: 322f   Walk 10 feet activity   Assist     Assist level: Supervision/Verbal cueing Assistive device: Rollator   Walk 50 feet activity   Assist    Assist level: Supervision/Verbal cueing Assistive device: Rollator    Walk 150 feet activity   Assist Walk 150 feet activity did not occur: Safety/medical concerns  Assist level: Supervision/Verbal cueing Assistive device: Rollator    Walk 10 feet on uneven surface  activity   Assist     Assist level: Minimal Assistance - Patient > 75% Assistive device: Other (comment) (wall hand railing on L side)   Wheelchair     Assist Is the patient using a wheelchair?: Yes (for transport to and from gym for time management.) Type of Wheelchair: Manual    Wheelchair assist level: Dependent - Patient 0%      Wheelchair 50 feet with 2 turns activity    Assist        Assist Level: Dependent - Patient 0%   Wheelchair 150 feet activity     Assist      Assist Level: Dependent - Patient 0%   Blood pressure (!) 151/86, pulse 60, temperature 98.1 F (36.7 C), temperature source Oral, resp. rate 18, height 5' 3.5" (1.613 m), weight 71.7 kg, SpO2 94 %.  Medical Problem List and Plan: 1. 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 Formulized individual treatment plan based on medical history, underlying problem and comorbidities.   -Continue CIR with PT/OT/SLP 2.  Antithrombotics: -DVT/anticoagulation:  Pharmaceutical: Eliquis             -antiplatelet therapy: Brilinta 3. Pain Management: Tylenol prn.   Add hs  gabapentin for neuropathic pain Xray RIght knee unremarkable will cont voltaren gel, tylenol, add knee sleeve, may have neuropathic component 4. Mood/Behavior/Sleep: LCSW to follow for evaluation and support.              -antipsychotic agents: N/A             -  pt with chronic anxiety disorder                         -xanax prn                         -pt was appropriate on exam today 5. Neuropsych/cognition: This patient is capable of making decisions on her own behalf. 6. Skin/Wound Care: Routine pressure relief measures.  7. Fluids/Electrolytes/Nutrition: Monitor I/O. Check CMET in am 8. HTN: Monitor BP TID- continues to be labile.  --Continue Amlodipine and Cozaar.  Vitals:   02/11/22 2041 02/12/22 0450  BP: 135/76 (!) 151/86  Pulse: 69 60  Resp: 18 18  Temp: 97.9 F (36.6 C) 98.1 F (36.7 C)  SpO2: 96% 94%  Some lability monitor prior to dosing changes -11/15 9. PAF: Monitor HR TID--continue Eliquis- rate is controlled  10. Intermittent hypokalemia: Recheck BMET in am.   -    Latest Ref Rng & Units 02/10/2022    5:10 AM 02/05/2022    5:54 AM 02/03/2022    5:09 AM  BMP  Glucose 70 - 99 mg/dL 97  104  96   BUN 8 - 23 mg/dL _0 Creatinine 0.44 - 1.00 mg/dL 0.64  0.81  0.79   Sodium 135 - 145 mmol/L 140  142  141   Potassium 3.5 - 5.1 mmol/L 3.4  3.6  3.3   Chloride 98 - 111 mmol/L 110  111  109   CO2 22 - 32 mmol/L _1 Calcium 8.9 - 10.3 mg/dL 8.6  8.9  8.7     11. Constipation: Continue Senna S bid.   -11/13 BM today, continue to monitor 12. Nasal Congestion  likely due to seasonal allergies: Will add nasocort to help with congestion/HA.   -11/9 Start Claritin for allergies.  She used Human resources officer at home.  -11/10 improved today, continue claritin   13.  RIght elbow pain lateral epicondylitis trial voltaren  14. Insomnia  -Trazodone PRN  -11/13 Start melatonin HS   LOS: 8 days A FACE TO FACE EVALUATION WAS PERFORMED  Charlett Blake 02/12/2022, 7:48 AM

## 2022-02-12 NOTE — Progress Notes (Signed)
Patient ID: Anita Fletcher, female   DOB: 05-Oct-1945, 76 y.o.   MRN: [id number]  Team Conference Report to Patient/Family  Team Conference discussion was reviewed with the patient and caregiver, including goals, any changes in plan of care and target discharge date.  Patient and caregiver express understanding and are in agreement.  The patient has a target discharge date of 02/13/22.  SW met with patient and provided team conference updates. Patient pleased with discharge tomorrow and will have her brother pick her up tomorrow. Patient would like meds sent to [place]. Patient will require a rollator. SW will arrange [place]. No additional questions or concerns. [name] 02/12/2022, 2:09 PM

## 2022-02-13 ENCOUNTER — Other Ambulatory Visit (HOSPITAL_COMMUNITY): Payer: Self-pay

## 2022-02-13 MED ORDER — GABAPENTIN 100 MG PO CAPS
100.0000 mg | ORAL_CAPSULE | Freq: Every day | ORAL | 0 refills | Status: DC
  Filled 2022-02-13: qty 30, 30d supply, fill #0

## 2022-02-13 MED ORDER — FLUTICASONE PROPIONATE 50 MCG/ACT NA SUSP
1.0000 | Freq: Every day | NASAL | 0 refills | Status: AC
  Filled 2022-02-13: qty 16, 30d supply, fill #0

## 2022-02-13 MED ORDER — LOSARTAN POTASSIUM 50 MG PO TABS
50.0000 mg | ORAL_TABLET | Freq: Every day | ORAL | 0 refills | Status: DC
  Filled 2022-02-13: qty 30, 30d supply, fill #0

## 2022-02-13 MED ORDER — MELATONIN 5 MG PO TABS
5.0000 mg | ORAL_TABLET | Freq: Every day | ORAL | 0 refills | Status: DC
  Filled 2022-02-13: qty 30, 30d supply, fill #0

## 2022-02-13 MED ORDER — DICLOFENAC SODIUM 1 % EX GEL
2.0000 g | Freq: Four times a day (QID) | CUTANEOUS | 0 refills | Status: AC
  Filled 2022-02-13: qty 100, 13d supply, fill #0

## 2022-02-13 MED ORDER — SENNOSIDES-DOCUSATE SODIUM 8.6-50 MG PO TABS
1.0000 | ORAL_TABLET | Freq: Two times a day (BID) | ORAL | 0 refills | Status: DC
  Filled 2022-02-13: qty 60, 30d supply, fill #0

## 2022-02-13 MED ORDER — AMLODIPINE BESYLATE 5 MG PO TABS
5.0000 mg | ORAL_TABLET | Freq: Every day | ORAL | 0 refills | Status: DC
  Filled 2022-02-13: qty 30, 30d supply, fill #0

## 2022-02-13 MED ORDER — TICAGRELOR 90 MG PO TABS
90.0000 mg | ORAL_TABLET | Freq: Two times a day (BID) | ORAL | 0 refills | Status: DC
  Filled 2022-02-13: qty 60, 30d supply, fill #0

## 2022-02-13 MED ORDER — LORATADINE 10 MG PO TABS
10.0000 mg | ORAL_TABLET | Freq: Every day | ORAL | 0 refills | Status: DC
  Filled 2022-02-13: qty 30, 30d supply, fill #0

## 2022-02-13 MED ORDER — APIXABAN 5 MG PO TABS
5.0000 mg | ORAL_TABLET | Freq: Two times a day (BID) | ORAL | 0 refills | Status: DC
  Filled 2022-02-13: qty 60, 30d supply, fill #0

## 2022-02-13 MED ORDER — POTASSIUM CHLORIDE CRYS ER 10 MEQ PO TBCR
10.0000 meq | EXTENDED_RELEASE_TABLET | Freq: Every day | ORAL | 0 refills | Status: DC
  Filled 2022-02-13: qty 30, 30d supply, fill #0

## 2022-02-13 MED ORDER — ATORVASTATIN CALCIUM 40 MG PO TABS
40.0000 mg | ORAL_TABLET | Freq: Every day | ORAL | 0 refills | Status: DC
  Filled 2022-02-13: qty 30, 30d supply, fill #0

## 2022-02-13 NOTE — Progress Notes (Signed)
Inpatient Rehabilitation Discharge Medication Review by a Pharmacist  A complete drug regimen review was completed for this patient to identify any potential clinically significant medication issues.  High Risk Drug Classes Is patient taking? Indication by Medication  Antipsychotic No   Anticoagulant Yes Apixaban for Afib   Antibiotic No   Opioid No   Antiplatelet Yes Ticagrelor for CVA  Hypoglycemics/insulin No   Vasoactive Medication Yes Amlodipine and losartan for HTN   Chemotherapy No   Other Yes Atorvastatin for HLD Gabapentin for nerve pain/sleep Kcl for supplementation      Type of Medication Issue Identified Description of Issue Recommendation(s)  Drug Interaction(s) (clinically significant)     Duplicate Therapy     Allergy     No Medication Administration End Date     Incorrect Dose     Additional Drug Therapy Needed     Significant med changes from prior encounter (inform family/care partners about these prior to discharge). NEW amlodipine, atorvastatin, diclofenac gel, gabapentin, loratadine, melatonin, Kcl, senna-doc  STOP metoprolol  Team educated patient     Patient left before could have this reflected on AVS - called patient and informed her verbally. She stated understanding  Other       Clinically significant medication issues were identified that warrant physician communication and completion of prescribed/recommended actions by midnight of the next day:  Yes  Name of provider notified for urgent issues identified: Marissa Nestle  Provider Method of Notification: phone Call    Pharmacist comments: Clinically significant medication issue with metoprolol was resolved per above.    Time spent performing this drug regimen review (minutes):  20   Alphia Moh, PharmD, [place], [facility] Clinical Pharmacist  Please check AMION for all [facility] Pharmacy phone numbers After 10:00 PM, call Main Pharmacy [phone]

## 2022-02-13 NOTE — Discharge Summary (Signed)
Physician Discharge Summary  Patient ID: Anita Fletcher MRN: [id number] DOB/AGE: 1945-09-10 76 y.o.  Admit date: 02/04/2022 Discharge date: 02/13/2022  Discharge Diagnoses:  Principal Problem:   Left thalamic infarction [facility]) Active Problems:   Paroxysmal atrial fibrillation (HCC)   HTN (hypertension)   Intracranial atherosclerosis   Hypokalemia   Insomnia   Constipation   Discharged Condition: stable  Significant Diagnostic Studies:   Labs:  Basic Metabolic Panel:    Latest Ref Rng & Units 02/10/2022    5:10 AM 02/05/2022    5:54 AM 02/03/2022    5:09 AM  BMP  Glucose 70 - 99 mg/dL 97  244  96   BUN 8 - 23 mg/dL 16  16  14    Creatinine 0.44 - 1.00 mg/dL  0.10  2.72   Sodium 135 - 145 mmol/L 140  142  141   Potassium 3.5 - 5.1 mmol/L 3.4  3.6  3.3   Chloride 98 - 111 mmol/L 110  111  109   CO2 22 - 32 mmol/L 20  23  23    Calcium 8.9 - 10.3 mg/dL 8.6  8.9  8.7      CBC:    Latest Ref Rng & Units 02/10/2022    5:10 AM 02/05/2022    5:54 AM 02/03/2022    5:09 AM  CBC  WBC 4.0 - 10.5 K/uL 7.8  6.8  7.6   Hemoglobin 12.0 - 15.0 g/dL 13/10/2021  13/08/2021  64.4   Hematocrit 36.0 - 46.0 % 39.7  42.4  40.9   Platelets 150 - 400 K/uL 268  256  236      CBG: No results for input(s): "GLUCAP" in the last 168 hours.  Brief HPI:   ALECEA Fletcher is a 76 y.o. female with history of HTN, TIA, PAF-on Eliquis was admitted on 01/27/2022 with 3-day history of intermittent numbness right face, right hand and RLE.  She was found to have elevated blood pressure and CT head done showing patchy hypodensity lentiform nuclei bilaterally.  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  MRI was not done due to history of claustrophobia.  She was transferred to [facility] and exam revealed right hemiataxia with right hypoesthesia and jacksonian march type dysesthesias of RLE followed by RUE and face.   She required sedation prior to x-ray and MRI brain done revealing severe atherosclerotic disease with severe stenosis proximal BA.    She underwent cerebral angiogram revealing 85% stenosis proximal to be an ulcerated plaque in left carotid bulb.  She underwent angioplasty with stenting of VA with complete resolution of stenosis on 11/03.  Postprocedure was found to have left facial droop with bidirectional horizontal nystagmus, decrease in left eye closure and right upper/right lower extremity drift concerning for brainstem infarct.  CT of head done revealing interval development of small acute left cerebellar infarcts and no bleed.  She was started on IV heparin as well as Cleviprex to control and Eliquis resumed 11/05.  Aspirin was DC'd and she continues on Brilinta.  She continues to be limited by nystagmus, dysmetria with hemiplegia, sensory deficits on the right as well as ataxia.  CIR was recommended due to functional decline.   Hospital Course: Anita Fletcher was admitted to rehab 02/04/2022 for inpatient therapies to consist of PT, ST and OT at least three hours five days a week. Past admission physiatrist, therapy team and rehab RN  have worked together to provide customized collaborative inpatient rehab.  She continues on Eliquis and Brilinta during her stay.  Follow-up CBC shows H&H to be stable without any signs of bleeding.  She did report right knee pain with activity.  Voltaren gel and knee sleeve were added for local measures.  Gabapentin has also been added for neuropathic pain right hand.  Dr. Kieth Brightly neuropsychologist has worked with patient on coping and adjustment issues.  Her mood has been stable with good motivation during his stay.  Her blood pressures were monitored on TID basis and have been controlled overall on amlodipine and Cozaar.  P.o. intake has been good and she is continent of bowel and bladder.  Follow-up check of electrolytes showed recurrent hypokalemia therefore she was  started on K-Dur 10 meq daily prior to discharge.  Melatonin was added to help with sleep-wake disruption.  Constipation has resolved with use of senna twice daily.  MShe is continent of bowel and bladder.  She has made great progress during his stay and is modified independent in supervised setting.  LCSW has assisted with PCP appointment for 11/27 at 1:25 PM.  She will continue to receive HHPT and HHOT after discharge.    Rehab course: During patient's stay in rehab weekly team conferences were held to monitor patient's progress, set goals and discuss barriers to discharge. At admission, patient required contact-guard to min assist for mobility and min assist with basic ADL tasks.  Speech therapy evaluation showed speech slightly affected by facial asymmetry and she was advanced to regular diet thin liquids with short course monitoring to assess diet tolerance.She  has had improvement in activity tolerance, balance, postural control as well as ability to compensate for deficits. She has had improvement in functional use RUE  and RLE as well as improvement in awareness.  She is able to complete ADL tasks at modified independent level.   She is tolerating regular diet without any signs or symptoms of aspiration and speech therapy signed off 11/10.  She is modified independent for transfers and to ambulate household distances with use of rollator. Supervision is recommended when in community setting.  Family can provide assistance necessary after discharge.  Disposition: Home  Diet: Heart healthy  Special Instructions: 1.  No driving or strenuous activity till cleared by MD. 2. Recommend repeat BMET in 1-2 weeks to recheck potassium level   Allergies as of 02/13/2022       Reactions   Codeine         Medication List     STOP taking these medications    metoprolol tartrate 25 MG tablet Commonly known as: LOPRESSOR       TAKE these medications    amLODipine 5 MG tablet Commonly known  as: NORVASC Take 1 tablet (5 mg total) by mouth daily.   apixaban 5 MG Tabs tablet Commonly known as: ELIQUIS Take 1 tablet (5 mg total) by mouth 2 (two) times daily.   atorvastatin 40 MG tablet Commonly known as: LIPITOR Take 1 tablet (40 mg total) by mouth daily.   Brilinta 90 MG Tabs tablet Generic drug: ticagrelor Take 1 tablet (90 mg total) by mouth 2 (two) times daily.   diclofenac Sodium 1 % Gel Commonly known as: VOLTAREN Apply 2 g topically 4 (four) times daily. Notes to patient: Right knee and shoulder   fluticasone 50 MCG/ACT nasal spray Commonly known as: FLONASE Use 1 spray in each nostril once daily.   gabapentin 100 MG capsule Commonly  known as: NEURONTIN Take 1 capsule (100 mg total) by mouth at bedtime.   loratadine 10 MG tablet Commonly known as: CLARITIN Take 1 tablet (10 mg total) by mouth daily.   losartan 50 MG tablet Commonly known as: COZAAR Take 1 tablet (50 mg total) by mouth daily.   melatonin 5 MG Tabs Take 1 tablet (5 mg total) by mouth at bedtime.   potassium chloride 10 MEQ tablet Commonly known as: KLOR-CON M Take 1 tablet (10 mEq total) by mouth daily.   Senexon-S 8.6-50 MG tablet Generic drug: senna-docusate Take 1 tablet by mouth 2 (two) times daily.        Follow-up Information     Kirsteins, Victorino Sparrow, MD Follow up.   Specialty: Physical Medicine and Rehabilitation Why: office will call you with follow up appointment Contact information: [address] [place] [address] [phone]         [facility] Follow up.   Why: office will call you with follow up appointment Contact information: [address]     [address] [place] [address] [phone]                Signed: Jacquelynn Cree 02/14/2022, 6:13 PM

## 2022-02-13 NOTE — Progress Notes (Signed)
PROGRESS NOTE   Subjective/Complaints:  No issues overnite , Right knee pain improved  Review of Systems  Constitutional:  Negative for chills and fever.  HENT:  Positive for congestion.   Eyes:  Negative for double vision.  Respiratory:  Negative for shortness of breath.   Cardiovascular:  Negative for chest pain and palpitations.  Gastrointestinal:  Negative for abdominal pain, constipation, diarrhea, nausea and vomiting.  Genitourinary:  Negative for dysuria.  Neurological:  Positive for tingling and sensory change. Negative for speech change.    Objective:   DG Knee 1-2 Views Right  Result Date: 02/11/2022 CLINICAL DATA:  Right knee pain and swelling. EXAM: RIGHT KNEE - 1-2 VIEW COMPARISON:  None Available. FINDINGS: No evidence of fracture, dislocation, or joint effusion. No significant arthropathy. No bony lesions or destruction. Calcified plaque in the region of the distal SFA/proximal popliteal artery. IMPRESSION: No acute findings. Calcified plaque in the region of the distal SFA/proximal popliteal artery. Electronically Signed   By: Irish Lack M.D.   On: 02/11/2022 09:15   No results for input(s): "WBC", "HGB", "HCT", "PLT" in the last 72 hours.  No results for input(s): "NA", "K", "CL", "CO2", "GLUCOSE", "BUN", "CREATININE", "CALCIUM" in the last 72 hours.   Intake/Output Summary (Last 24 hours) at 02/13/2022 0852 Last data filed at 02/13/2022 0720 Gross per 24 hour  Intake 951 ml  Output --  Net 951 ml        Physical Exam: Vital Signs Blood pressure 120/65, pulse 70, temperature 98.3 F (36.8 C), resp. rate 16, height 5' 3.5" (1.613 m), weight 71.7 kg, SpO2 97 %.   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 No joint swelling, -tenderness RIght lateral epicondyle  No tenderness over Right knee , no effusion, + crepitus Right patella    Assessment/Plan: 1. Functional deficits which require 3+ hours per day of interdisciplinary therapy in a comprehensive inpatient rehab setting. Physiatrist is providing close team supervision and 24 hour management of active medical problems listed below. Physiatrist and rehab team continue to assess barriers to discharge/monitor patient progress toward functional and medical goals  Care Tool:  Bathing    Body parts bathed by patient: Right arm, Abdomen, Front perineal area, Right lower leg, Chest, Left arm,  Left upper leg, Right upper leg, Buttocks, Left lower leg, Face         Bathing assist Assist Level: Independent with assistive device     Upper Body Dressing/Undressing Upper body dressing   What is the patient wearing?: Pull over shirt    Upper body assist Assist Level: Independent with assistive device    Lower Body Dressing/Undressing Lower body dressing      What is the patient wearing?: Underwear/pull up, Pants     Lower body assist Assist for lower body dressing:  Independent with assitive device     Toileting Toileting    Toileting assist Assist for toileting: Independent with assistive device     Transfers Chair/bed transfer  Transfers assist     Chair/bed transfer assist level: Independent with assistive device Chair/bed transfer assistive device: Walker Aeronautical engineer)   Locomotion Ambulation   Ambulation assist      Assist level: Supervision/Verbal cueing Assistive device: Rollator Max distance: 564ft   Walk 10 feet activity   Assist     Assist level: Independent with assistive device Assistive device: Rollator   Walk 50 feet activity   Assist    Assist level: Independent with assistive device Assistive device: Rollator    Walk 150 feet activity   Assist Walk 150 feet activity did not occur: Safety/medical concerns  Assist level: Supervision/Verbal cueing Assistive device: Rollator    Walk 10 feet on uneven surface  activity   Assist     Assist level: Supervision/Verbal cueing Assistive device: Rollator   Wheelchair     Assist Is the patient using a wheelchair?: No Type of Wheelchair: Manual Wheelchair activity did not occur: N/A  Wheelchair assist level: Dependent - Patient 0%      Wheelchair 50 feet with 2 turns activity    Assist    Wheelchair 50 feet with 2 turns activity did not occur: N/A   Assist Level: Dependent - Patient 0%   Wheelchair 150 feet activity     Assist  Wheelchair 150 feet activity did not occur: N/A   Assist Level: Dependent - Patient 0%   Blood pressure 120/65, pulse 70, temperature 98.3 F (36.8 C), resp. rate 16, height 5' 3.5" (1.613 m), weight 71.7 kg, SpO2 97 %.  Medical Problem List and Plan: 1. Functional deficits secondary to left posterior limb internal capsule, thalamic infarcts and subsequent left cerebellar infarcts RIght hemisensory deficit LE>UE , left CN 7 palsy              -patient may shower             -ELOS/Goals: Mod I d/c  today     2.  Antithrombotics: -DVT/anticoagulation:  Pharmaceutical: Eliquis             -antiplatelet therapy: Brilinta 3. Pain Management: Tylenol prn.   Add hs  gabapentin for neuropathic pain Xray RIght knee unremarkable will cont voltaren gel, tylenol, add knee sleeve, may have neuropathic component 4. Mood/Behavior/Sleep: LCSW to follow for evaluation and support.              -antipsychotic agents: N/A             -pt with chronic anxiety disorder                         -xanax prn                         -  pt was appropriate on exam today 5. Neuropsych/cognition: This patient is capable of making decisions on her own behalf. 6. Skin/Wound Care: Routine pressure relief measures.  7. Fluids/Electrolytes/Nutrition: Monitor I/O. Check CMET in am 8. HTN: Monitor BP TID- continues to be labile.  --Continue Amlodipine and Cozaar.  Vitals:   02/13/22 0536 02/13/22 0722  BP: (!) 165/77 120/65  Pulse: 61 70  Resp:    Temp:    SpO2:    Some lability monitor prior to dosing changes -11/15 9. PAF: Monitor HR TID--continue Eliquis- rate is controlled  10. hypokalemia:cont KCL supp   -    Latest Ref Rng & Units 02/10/2022    5:10 AM 02/05/2022    5:54 AM 02/03/2022    5:09 AM  BMP  Glucose 70 - 99 mg/dL 97  856  96   BUN 8 - 23 mg/dL 16  16  14    Creatinine 0.44 - 1.00 mg/dL  3.14  9.70   Sodium 135 - 145 mmol/L 140  142  141   Potassium 3.5 - 5.1 mmol/L 3.4  3.6  3.3   Chloride 98 - 111 mmol/L 110  111  109   CO2 22 - 32 mmol/L 20  23  23    Calcium 8.9 - 10.3 mg/dL 8.6  8.9  8.7     11. Constipation: Continue Senna S bid.    12. Nasal Congestion likely due to seasonal allergies: Will add nasocort to help with congestion/HA.   -11/9 Start Claritin for allergies.  She used at home.  -11/10 improved today, continue claritin   13.  RIght elbow pain lateral epicondylitis  improved voltaren     LOS: 9 days A FACE TO FACE EVALUATION WAS PERFORMED  Careers adviser 02/13/2022, 8:52 AM

## 2022-02-13 NOTE — Progress Notes (Signed)
Patient ID: Anita Fletcher, female   DOB: 08-15-1945, 76 y.o.   MRN: [id number]  New Patient Visit with Dulce Sellar Monday February 24, 2022 Arrive by 1:25 PM  Starts at 1:40 PM   [facility] [address] [place] [address] [phone]

## 2022-02-13 NOTE — Progress Notes (Signed)
INPATIENT REHABILITATION DISCHARGE NOTE   Discharge instructions by: Marissa Nestle, PA  Verbalized understanding: Yes  Skin care/Wound care healing? None  Pain: None  IV's: None  Tubes/Drains: none   O2: none  Safety instructions: reviewed with pt  Patient belongings: sent with with pt and family  Discharged to: home  Discharged via: family transport  Notes: done

## 2022-02-13 NOTE — Progress Notes (Addendum)
Inpatient Rehabilitation Care Coordinator Discharge Note   Patient Details  Name: CRYTAL PENSINGER MRN: [id number] Date of Birth: 30-Apr-1945   Discharge location: Home  Length of Stay: 9 Days  Discharge activity level: Mod I  Home/community participation: Brother  Patient response [id number] Literacy - How often do you need to have someone help you when you read instructions, pamphlets, or other written material from your doctor or pharmacy?: Never  Patient response [id number] Isolation - How often do you feel lonely or isolated from those around you?: Never  Services provided included: MD, RD, PT, OT, SLP, RN, CM, TR, Pharmacy, Neuropsych, SW  Financial Services:  Field seismologist Utilized: Private Insurance [employer]  Choices offered to/list presented to: pt  Follow-up services arranged:  Home Health Home Health Agency: [facility]        Patient response to transportation need: Is the patient able to respond to transportation needs?: Yes In the past 12 months, has lack of transportation kept you from medical appointments or from getting medications?: No In the past 12 months, has lack of transportation kept you from meetings, work, or from getting things needed for daily living?: No    Comments (or additional information):  Patient/Family verbalized understanding of follow-up arrangements:  Yes  Individual responsible for coordination of the follow-up plan: self  Confirmed correct DME delivered: Andria Rhein 02/13/2022    Andria Rhein

## 2022-02-13 NOTE — Progress Notes (Addendum)
Patient ID: Anita Fletcher, female   DOB: [birth year], 76 y.o.   MRN: [id number]  Patient provide SW with physical address for [facility].  [address] [place] [address]  Orders sent

## 2022-02-13 NOTE — Discharge Instructions (Addendum)
Inpatient Rehab Discharge Instructions  [facility] Discharge date and time: 02/13/22   Activities/Precautions/ Functional Status: Activity: no lifting, driving, or strenuous exercise till cleared by MD Diet: cardiac diet Wound Care: none needed  Functional status:  ___ No restrictions     ___ Walk up steps independently ___ 24/7 supervision/assistance   ___ Walk up steps with assistance _X__ Intermittent supervision/assistance  _X__ Bathe/dress independently ___ Walk with walker     ___ Bathe/dress with assistance ___ Walk Independently    ___ Shower independently _X__ Walk with supervision when out of home setting ___ Shower with assistance _X__ No alcohol     ___ Return to work/school ________  Special Instructions:  COMMUNITY REFERRALS UPON DISCHARGE:    Home Health:   PT     OT                    Agency: [facility]  Phone: [phone]   Medical Equipment/Items Ordered: Rollator                                                   Agency/Supplier: [employer] [phone]   STROKE/TIA DISCHARGE INSTRUCTIONS SMOKING Cigarette smoking nearly doubles your risk of having a stroke & is the single most alterable risk factor  If you smoke or have smoked in the last 12 months, you are advised to quit smoking for your health. Most of the excess cardiovascular risk related to smoking disappears within a year of stopping. Ask you doctor about anti-smoking medications [place] Quit Line: 1-800-QUIT NOW Free Smoking Cessation Classes [phone]  CHOLESTEROL Know your levels; limit fat & cholesterol in your diet  Lipid Panel     Component Value Date/Time   CHOL 226 (H) 01/28/2022 0319   TRIG 253 (H) 01/28/2022 0319   HDL 35 (L) 01/28/2022 0319   CHOLHDL 6.5 01/28/2022 0319   VLDL 51 (H) 01/28/2022 0319   LDLCALC 140 (H) 01/28/2022 0319     Many patients benefit from treatment even if their cholesterol is at goal. Goal: Total Cholesterol (CHOL) less than 160 Goal:  Triglycerides (TRIG)  less than 150 Goal:  HDL greater than 40 Goal:  LDL (LDLCALC) less than 100   BLOOD PRESSURE American Stroke Association blood pressure target is less that 120/80 mm/Hg  Your discharge blood pressure is:  BP: 120/65 Monitor your blood pressure Limit your salt and alcohol intake Many individuals will require more than one medication for high blood pressure  DIABETES (A1c is a blood sugar average for last 3 months) Goal HGBA1c is under 7% (HBGA1c is blood sugar average for last 3 months)  Diabetes: No known diagnosis of diabetes    Lab Results  Component Value Date   HGBA1C 5.5 01/28/2022    Your HGBA1c can be lowered with medications, healthy diet, and exercise. Check your blood sugar as directed by your physician Call your physician if you experience unexplained or low blood sugars.  PHYSICAL ACTIVITY/REHABILITATION Goal is 30 minutes at least 4 days per week  Activity: No driving, Therapies: see above Return to work: N/A Activity decreases your risk of heart attack and stroke and makes your heart stronger.  It helps control your weight and blood pressure; helps you relax and can improve your mood. Participate in a regular exercise program. Talk with your doctor about the best form  of exercise for you (dancing, walking, swimming, cycling).  DIET/WEIGHT Goal is to maintain a healthy weight  Your discharge diet is:  Diet Order             Diet regular Room service appropriate? Yes; Fluid consistency: Thin  Diet effective now                   liquids Your height is:  Height: 5' 3.5" (161.3 cm) Your current weight is: Weight: 71.7 kg Your Body Mass Index (BMI) is:  BMI (Calculated): 27.56 Following the type of diet specifically designed for you will help prevent another stroke. Your goal weight range is:   Your goal Body Mass Index (BMI) is 19-24. Healthy food habits can help reduce 3 risk factors for stroke:  High cholesterol, hypertension, and excess weight.  RESOURCES  Stroke/Support Group:  Call [phone]   STROKE EDUCATION PROVIDED/REVIEWED AND GIVEN TO PATIENT Stroke warning signs and symptoms How to activate emergency medical system (call 911). Medications prescribed at discharge. Need for follow-up after discharge. Personal risk factors for stroke. Pneumonia vaccine given:  Flu vaccine given:  My questions have been answered, the writing is legible, and I understand these instructions.  I will adhere to these goals & educational materials that have been provided to me after my discharge from the hospital.      My questions have been answered and I understand these instructions. I will adhere to these goals and the provided educational materials after my discharge from the hospital.  Patient/Caregiver Signature _______________________________ Date __________  Clinician Signature _______________________________________ Date __________  Please bring this form and your medication list with you to all your follow-up doctor's appointments.

## 2022-02-14 ENCOUNTER — Other Ambulatory Visit (HOSPITAL_COMMUNITY): Payer: Self-pay

## 2022-02-14 DIAGNOSIS — K5900 Constipation, unspecified: Secondary | ICD-10-CM | POA: Insufficient documentation

## 2022-02-14 DIAGNOSIS — G4700 Insomnia, unspecified: Secondary | ICD-10-CM | POA: Insufficient documentation

## 2022-02-14 DIAGNOSIS — E876 Hypokalemia: Secondary | ICD-10-CM | POA: Insufficient documentation

## 2022-02-24 ENCOUNTER — Ambulatory Visit: Payer: Medicare HMO | Admitting: Family

## 2022-02-26 ENCOUNTER — Encounter: Payer: Medicare HMO | Attending: Registered Nurse | Admitting: Registered Nurse

## 2022-02-26 ENCOUNTER — Encounter: Payer: Self-pay | Admitting: Registered Nurse

## 2022-02-26 VITALS — BP 147/81 | HR 70 | Ht 63.5 in | Wt 156.4 lb

## 2022-02-26 DIAGNOSIS — I6381 Other cerebral infarction due to occlusion or stenosis of small artery: Secondary | ICD-10-CM

## 2022-02-26 DIAGNOSIS — I480 Paroxysmal atrial fibrillation: Secondary | ICD-10-CM | POA: Insufficient documentation

## 2022-02-26 DIAGNOSIS — I10 Essential (primary) hypertension: Secondary | ICD-10-CM | POA: Diagnosis not present

## 2022-02-26 MED ORDER — TICAGRELOR 90 MG PO TABS: 90.0000 mg | ORAL_TABLET | Freq: Two times a day (BID) | ORAL | 0 refills | Status: DC

## 2022-02-26 MED ORDER — GABAPENTIN 100 MG PO CAPS: 100.0000 mg | ORAL_CAPSULE | Freq: Every day | ORAL | 1 refills | Status: DC

## 2022-02-26 NOTE — Progress Notes (Signed)
Subjective:    Patient ID: Anita Fletcher, female    DOB: December 14, 1945, 76 y.o.   MRN: [id number]  HPI: Anita Fletcher is a 76 y.o. female who  is here for [place] appointment for F/U of her  Left Thalamic Infarction, Paroxysmal Atrial Fibrillation and Essential Hypertension. She presented to [facility] on 01/27/2022 with complaints of facial numbness.  Dr. Denton Brick H&P Note: PI: SABIHA HANOVER is a 76 y.o. female with medical history significant for TIA, hypertension, atrial fibrillation. Patient presented to the ED with complaints of numbness to her right face, right hand and right lower extremity this is about 4.30 p.m. yesterday.  She reports symptoms are present mostly when she is standing, when she is lying down she does not have any symptoms.  She denies facial asymmetry, no change in speech no change in vision no weakness of her extremities.  She is compliant with her Eliquis. She reports that about 10 years ago, she was diagnosed with a TIA, then she had tingling involving the right side of her face and extremities that lasted about 15 minutes and resolved, symptoms were also mild then.  CT Head WO Contrast IMPRESSION: 1. Patchy hypodensity in the lentiform nuclei bilaterally could reflect acute or subacute infarcts. Consider brain MRI for further evaluation. 2. No acute intracranial hemorrhage or large vessel territorial infarct.  CTA: IMPRESSION: Multifocal intracranial atherosclerotic vasculopathy, most notable at the proximal aspect of the basilar artery where there is severe stenosis and the P2 segment of the left PCA where there is severe focal stenosis to near occlusion. No evidence of large vessel occlusion. She was transferred to [facility]  MR Brain WO Contrast:  IMPRESSION: 1. 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 2. Small remote infarcts and background advanced chronic small-vessel ischemic  change as above. 3. Scattered punctate chronic microhemorrhages, favored hypertensive in etiology.   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 Kassing was admitted to inpatient Rehabilitation on 02/04/2022 and discharged home on 02/13/2022. She state she has numbness in her right hand and right foot. She rates her pain 1. She is receiving Home Health Therapy with [facility]. Also reports she has a good appetite.   Neurology was called today, she has a scheduled appointment with Neurology.   Pain Inventory Average Pain 5 Pain Right Now 1 My pain is intermittent and tingling  LOCATION OF PAIN  hand fingers leg toes  BOWEL Number of stools per week: 7  BLADDER Pads Leakage with coughing Yes     Mobility use a walker ability to climb steps?  yes do you drive?  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  no  Physicians involved in your care Any changes since last visit?  no   Family History  Problem Relation Age of Onset   Alcoholism Father    Arrhythmia Brother    Diabetes Brother    Mental illness Brother    Social History   Socioeconomic History   Marital status: Single    Spouse name: Not on file   Number of children: 0   Years of education: Not on file   Highest education  level: Not on file  Occupational History   Occupation: Resume building  Tobacco Use   Smoking status: Never   Smokeless tobacco: Never  Vaping Use   Vaping Use: Never used  Substance and Sexual Activity   Alcohol use: No   Drug use: No   Sexual activity: Not on file  Other Topics Concern   Not on file  Social History Narrative   Not on file   Social Determinants of Health    Financial Resource Strain: Not on file  Food Insecurity: No Food Insecurity (01/29/2022)   Hunger Vital Sign    Worried About Running Out of Food in the Last Year: Never true    Ran Out of Food in the Last Year: Never true  Transportation Needs: No Transportation Needs (01/29/2022)   PRAPARE - Administrator, Civil Service (Medical): No    Lack of Transportation (Non-Medical): No  Physical Activity: Not on file  Stress: Not on file  Social Connections: Not on file   Past Surgical History:  Procedure Laterality Date   EYE SURGERY     IR ANGIO INTRA EXTRACRAN SEL COM CAROTID INNOMINATE BILAT MOD SED  01/30/2022   IR ANGIO VERTEBRAL SEL VERTEBRAL UNI L MOD SED  01/30/2022   IR ANGIO VERTEBRAL SEL VERTEBRAL UNI L MOD SED  01/31/2022   IR ANGIOGRAM FOLLOW UP STUDY  01/31/2022   IR CT HEAD LTD  01/31/2022   IR INTRA CRAN STENT  01/31/2022   IR US GUIDE VASC ACCESS LEFT  01/31/2022   IR US GUIDE VASC ACCESS RIGHT  01/30/2022   RADIOLOGY WITH ANESTHESIA N/A 01/30/2022   Procedure: MRI WITH ANESTHESIA - BRAIN W/O CONSTRAST;  Surgeon: Radiologist, Medication, MD;  Location: [facility] OR;  Service: Radiology;  Laterality: N/A;   RADIOLOGY WITH ANESTHESIA N/A 01/31/2022   Procedure: RADIOLOGY WITH ANESTHESIA;  Surgeon: Radiologist, Medication, MD;  Location: [facility] OR;  Service: Radiology;  Laterality: N/A;   Past Medical History:  Diagnosis Date   Acute CVA (cerebrovascular accident) (HCC) 01/27/2022   HTN (hypertension) 01/27/2022   PAF (paroxysmal atrial fibrillation) (HCC)    TIA (transient ischemic attack)    BP (!) 147/81   Pulse 70   Ht 5' 3.5" (1.613 m)   Wt 156 lb 6.4 oz (70.9 kg)   SpO2 98%   BMI 27.27 kg/m   Opioid Risk Score:   Fall Risk Score:  `1  Depression screen [facility] 2/9     02/26/2022    1:28 PM  Depression screen PHQ 2/9  Decreased Interest 1  Down, Depressed, Hopeless 1  PHQ - 2 Score 2  Altered sleeping 0  Tired, decreased energy 3  Change in appetite 1   Feeling bad or failure about yourself  1  Trouble concentrating 0  Moving slowly or fidgety/restless 0  Suicidal thoughts 0  PHQ-9 Score 7  Difficult doing work/chores Somewhat difficult     Review of Systems  Constitutional: Negative.   HENT: Negative.    Eyes: Negative.   Respiratory: Negative.    Cardiovascular: Negative.   Gastrointestinal: Negative.   Endocrine: Negative.   Genitourinary: Negative.   Musculoskeletal:        Hands fingers leg toes  Skin: Negative.   Allergic/Immunologic: Negative.   Neurological:  Positive for numbness.  Hematological:  Bruises/bleeds easily.       Eliquis and Brilinta  Psychiatric/Behavioral:  Positive for dysphoric mood.   All other systems reviewed and are negative.  Objective:   Physical Exam Vitals and nursing note reviewed.  Constitutional:      Appearance: Normal appearance.  Cardiovascular:     Rate and Rhythm: Normal rate and regular rhythm.     Pulses: Normal pulses.     Heart sounds: Normal heart sounds.  Musculoskeletal:     Cervical back: Normal range of motion and neck supple.     Comments: Normal Muscle Bulk and Muscle Testing Reveals:  Upper Extremities: Full ROM and Muscle Strength on Right 4/5 and Left 5/5  Lower Extremities: Full ROM and Muscle Strength 5/5 Right Lower Extremity Flexion Produces Numbness in right lower extremity and Right Heel Arises from Table slowly using walker for support Narrow Based  Gait     Skin:    General: Skin is warm and dry.  Neurological:     Mental Status: She is alert and oriented to person, place, and time.  Psychiatric:        Mood and Affect: Mood normal.        Behavior: Behavior normal.         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 Neurology was called, she has a scheduled HFU appointment with Neurology. Continue Home Health  Therapy with [facility].  Continue Eliquis and Brilinta 2. Paroxysmal Atrial Fibrillation Continue current medication Regimen. Cardiology Following. Continue to Monitor.  3.  Essential Hypertension: Continue current medication regimen. PCP Following. Continue to Monitor.   F/U Dr Wynn Banker in 4- 6 weeks

## 2022-02-26 NOTE — Patient Instructions (Signed)
Call Dr Carmon Ginsberg regarding your Eliquis Refill, if you have a problem.  Please call Office : [phone]

## 2022-02-28 ENCOUNTER — Encounter: Payer: Self-pay | Admitting: Internal Medicine

## 2022-02-28 ENCOUNTER — Ambulatory Visit (INDEPENDENT_AMBULATORY_CARE_PROVIDER_SITE_OTHER): Payer: Medicare HMO | Admitting: Internal Medicine

## 2022-02-28 ENCOUNTER — Other Ambulatory Visit (HOSPITAL_COMMUNITY): Payer: Self-pay

## 2022-02-28 VITALS — BP 155/89 | HR 60 | Temp 98.1°F | Resp 12 | Ht 63.5 in | Wt 156.2 lb

## 2022-02-28 DIAGNOSIS — I6930 Unspecified sequelae of cerebral infarction: Secondary | ICD-10-CM

## 2022-02-28 DIAGNOSIS — E876 Hypokalemia: Secondary | ICD-10-CM | POA: Diagnosis not present

## 2022-02-28 DIAGNOSIS — I69392 Facial weakness following cerebral infarction: Secondary | ICD-10-CM | POA: Diagnosis not present

## 2022-02-28 DIAGNOSIS — R201 Hypoesthesia of skin: Secondary | ICD-10-CM | POA: Diagnosis not present

## 2022-02-28 DIAGNOSIS — H35033 Hypertensive retinopathy, bilateral: Secondary | ICD-10-CM

## 2022-02-28 DIAGNOSIS — F40240 Claustrophobia: Secondary | ICD-10-CM

## 2022-02-28 DIAGNOSIS — Z95828 Presence of other vascular implants and grafts: Secondary | ICD-10-CM

## 2022-02-28 DIAGNOSIS — I10 Essential (primary) hypertension: Secondary | ICD-10-CM | POA: Diagnosis not present

## 2022-02-28 DIAGNOSIS — G4701 Insomnia due to medical condition: Secondary | ICD-10-CM

## 2022-02-28 DIAGNOSIS — I69398 Other sequelae of cerebral infarction: Secondary | ICD-10-CM | POA: Diagnosis not present

## 2022-02-28 DIAGNOSIS — F410 Panic disorder [episodic paroxysmal anxiety] without agoraphobia: Secondary | ICD-10-CM | POA: Diagnosis not present

## 2022-02-28 DIAGNOSIS — Z9181 History of falling: Secondary | ICD-10-CM

## 2022-02-28 DIAGNOSIS — I480 Paroxysmal atrial fibrillation: Secondary | ICD-10-CM | POA: Diagnosis not present

## 2022-02-28 DIAGNOSIS — G63 Polyneuropathy in diseases classified elsewhere: Secondary | ICD-10-CM

## 2022-02-28 DIAGNOSIS — I739 Peripheral vascular disease, unspecified: Secondary | ICD-10-CM | POA: Diagnosis not present

## 2022-02-28 DIAGNOSIS — H5509 Other forms of nystagmus: Secondary | ICD-10-CM | POA: Diagnosis not present

## 2022-02-28 DIAGNOSIS — K5904 Chronic idiopathic constipation: Secondary | ICD-10-CM

## 2022-02-28 DIAGNOSIS — I69351 Hemiplegia and hemiparesis following cerebral infarction affecting right dominant side: Secondary | ICD-10-CM | POA: Diagnosis not present

## 2022-02-28 DIAGNOSIS — H35039 Hypertensive retinopathy, unspecified eye: Secondary | ICD-10-CM | POA: Diagnosis not present

## 2022-02-28 DIAGNOSIS — Z7901 Long term (current) use of anticoagulants: Secondary | ICD-10-CM

## 2022-02-28 MED ORDER — GABAPENTIN 100 MG PO CAPS: 100.0000 mg | ORAL_CAPSULE | Freq: Every day | ORAL | 1 refills | Status: DC

## 2022-02-28 MED ORDER — LOSARTAN POTASSIUM 50 MG PO TABS
50.0000 mg | ORAL_TABLET | Freq: Every day | ORAL | 3 refills | Status: DC
  Filled 2022-02-28: qty 90, 90d supply, fill #0

## 2022-02-28 MED ORDER — LOSARTAN POTASSIUM 50 MG PO TABS: 50.0000 mg | ORAL_TABLET | Freq: Every day | ORAL | 3 refills | Status: DC

## 2022-02-28 MED ORDER — AMLODIPINE BESYLATE 5 MG PO TABS: 5.0000 mg | ORAL_TABLET | Freq: Every day | ORAL | 3 refills | Status: DC

## 2022-02-28 MED ORDER — GABAPENTIN 100 MG PO CAPS
100.0000 mg | ORAL_CAPSULE | Freq: Every day | ORAL | 1 refills | Status: DC
  Filled 2022-02-28: qty 90, 90d supply, fill #0

## 2022-02-28 MED ORDER — MELATONIN 5 MG PO TABS
5.0000 mg | ORAL_TABLET | Freq: Every day | ORAL | 3 refills | Status: DC
  Filled 2022-02-28: qty 90, 90d supply, fill #0

## 2022-02-28 MED ORDER — AMLODIPINE BESYLATE 5 MG PO TABS
5.0000 mg | ORAL_TABLET | Freq: Every day | ORAL | 3 refills | Status: DC
  Filled 2022-02-28: qty 90, 90d supply, fill #0

## 2022-02-28 MED ORDER — SENNOSIDES-DOCUSATE SODIUM 8.6-50 MG PO TABS
1.0000 | ORAL_TABLET | Freq: Two times a day (BID) | ORAL | 3 refills | Status: DC
  Filled 2022-02-28: qty 180, 90d supply, fill #0

## 2022-02-28 MED ORDER — ATORVASTATIN CALCIUM 40 MG PO TABS: 40.0000 mg | ORAL_TABLET | Freq: Every day | ORAL | 3 refills | Status: DC

## 2022-02-28 MED ORDER — TICAGRELOR 90 MG PO TABS
90.0000 mg | ORAL_TABLET | Freq: Two times a day (BID) | ORAL | 3 refills | Status: DC
  Filled 2022-02-28: qty 180, 90d supply, fill #0

## 2022-02-28 MED ORDER — SENNOSIDES-DOCUSATE SODIUM 8.6-50 MG PO TABS: 1.0000 | ORAL_TABLET | Freq: Two times a day (BID) | ORAL | 3 refills | Status: AC

## 2022-02-28 MED ORDER — MELATONIN 5 MG PO TABS: 5.0000 mg | ORAL_TABLET | Freq: Every day | ORAL | 3 refills | Status: AC

## 2022-02-28 MED ORDER — APIXABAN 5 MG PO TABS
5.0000 mg | ORAL_TABLET | Freq: Two times a day (BID) | ORAL | 3 refills | Status: DC
  Filled 2022-02-28: qty 180, 90d supply, fill #0

## 2022-02-28 MED ORDER — ATORVASTATIN CALCIUM 40 MG PO TABS
40.0000 mg | ORAL_TABLET | Freq: Every day | ORAL | 3 refills | Status: DC
  Filled 2022-02-28: qty 90, 90d supply, fill #0

## 2022-02-28 MED ORDER — TICAGRELOR 90 MG PO TABS: 90.0000 mg | ORAL_TABLET | Freq: Two times a day (BID) | ORAL | 3 refills | Status: DC

## 2022-02-28 MED ORDER — APIXABAN 5 MG PO TABS: 5.0000 mg | ORAL_TABLET | Freq: Two times a day (BID) | ORAL | 3 refills | Status: DC

## 2022-02-28 NOTE — Patient Instructions (Addendum)
It was a pleasure seeing you today!  Your health and satisfaction are my top priorities. If you believe your experience today was worthy of a 5-star rating, I'd be grateful for your feedback! Anita Olszewski, MD   AT CHECKOUT:  []    Sign release of information at the check out desk for: Any records we need for your care and to be your medical home  []    Schedule next appointment:  Please schedule a close follow up appointment 1-3 months from now at checkout.  To recheck also annual wellness exam  If you are not doing well:  Return to the office sooner  Please bring all your medicines to each appointment If your condition begins to worsen or become severe:  GO to the ER   CLINICAL PLAN REMINDERS: Your checklist to help you remember today's clinical plan  []    Please schedule a close follow up appointment 1-3 months from now at checkout.   []   (Optional):  Review your clinical notes on MyChart after they are completed.     Today's draft of the physician documented plan for today's visit: (final revisions will be visible on MyChart chart later) History of CVA with residual deficit Assessment & Plan: Checked vision its good Still encouraged having neurology agree before she drives but on my exam I have no reservations.  She seems to be clear headed and good vision- I didn't test for hemineglect but she has good peripheral vision both sides. Also can't feel her R foot on the pedal well.    Orders: -     Ticagrelor; Take 1 tablet (90 mg total) by mouth 2 (two) times daily.  Dispense: 180 tablet; Refill: 3  Paroxysmal atrial fibrillation (HCC) -     Losartan Potassium; Take 1 tablet (50 mg total) by mouth daily.  Dispense: 90 tablet; Refill: 3 -     Atorvastatin Calcium; Take 1 tablet (40 mg total) by mouth daily.  Dispense: 90 tablet; Refill: 3 -     Apixaban; Take 1 tablet (5 mg total) by mouth 2 (two) times daily.  Dispense: 180 tablet; Refill: 3 -     amLODIPine Besylate; Take 1  tablet (5 mg total) by mouth daily.  Dispense: 90 tablet; Refill: 3  Hypertension, unspecified type Assessment & Plan: Individualized Hypertension Management: Stable, un controlled at  BP Readings from Last 1 Encounters:  02/28/22 (!) 155/89  I explained that her blood pressure is high but she reported it is normal at home before she came in that she has history of whitecoat hypertension and I also presume that she will monitor at home so we will change the medicine today.  We did discuss a little bit about a goal blood pressure because I think that is a difficult thing to decide for her but definitely want to be at least on average under 140/90 and probably better  Resistant/secondary hypertension workup: not necessary in my opinion Current hypertension medications:       Sig   amLODipine (NORVASC) 5 MG tablet (Taking) Take 1 tablet (5 mg total) by mouth daily.   losartan (COZAAR) 50 MG tablet (Taking) Take 1 tablet (50 mg total) by mouth daily.       Adjust medications as follows:  No change but need home monitorin  Standardized hypertension counseling and management: Counseled her to limit: salt, alcohol, NSAIDS, excess body weight. Have explained risks of poor control are FUTURE stroke and heart attacks Encouragement for home blood pressure  monitoring at least daily Explained Red Flag symptoms for ER: if blood pressure over 180 AND new headache, shortness of breath, confusion, or chest discomfort or new stroke symptoms See AFTER VISIT SUMMARY for addition educational information provided Offered to refill meds.    Hypokalemia -     Basic metabolic panel  Chronic idiopathic constipation -     Sennosides-Docusate Sodium; Take 1 tablet by mouth 2 (two) times daily.  Dispense: 180 tablet; Refill: 3  Hypertensive retinopathy, unspecified laterality  Insomnia due to medical condition -     Melatonin; Take 1 tablet (5 mg total) by mouth at bedtime.  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  Dispense: 90 capsule; Refill: 1  PAD (peripheral artery disease) (HCC) -     Atorvastatin Calcium; Take 1 tablet (40 mg total) by mouth daily.  Dispense: 90 tablet; Refill: 3      QUESTIONS & CONCERNS: CLINICAL: please contact me via phone [phone] OR MyChart messaging  LAB & IMAGING:   We will call you if the results are significantly abnormal or you don't use MyChart.  Most normal results will be posted to MyChart immediately and have a clinical review message by Dr. Jon Billings posted within 2-3 business days.   If you have not heard from [place] regarding the results in 2 weeks OR if you need priority reporting, please contact this office. MYCHART:  The fastest way to get your results and easiest way to stay in touch with [place] is by activating your My Chart account. Instructions are located on the last page of this paperwork.  BILLING: xray and lab orders are billed from separate companies and questions./concerns should be directed to the invoicing company.  For visit charges please discuss with our administrative services COMPLAINTS:  please let Dr. Jon Billings know or see the [facility] Healthcare Practice Administrator - Burnett Kanaris, by asking at the front desk: we want you to be satisfied with every experience and we would be grateful for the opportunity to address any problems

## 2022-02-28 NOTE — Progress Notes (Signed)
[facility]  Phone: [phone]  New patient visit  Visit Date: 02/28/2022 Patient: Anita MulletSheila G [facility]   DOB: April 06, 1945   76 y.o. Female  MRN: [id number]  Today's healthcare provider: Lula Olszewskiyan G Timiyah Romito, MD  Assessment and Plan:   Presents to establish  care with Primary Care Provider (PCP) after having recent cerebrovascular accident. Hospital records/discharge summary reviewed and updated within her  Anita HatchetSheila was seen today for establish care and medication refill.  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  She was found to have elevated blood pressure and CT head done showing patchy hypodensity lentiform nuclei bilaterally.  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  MRI was not done due to history of claustrophobia.  [facility] and exam revealed right hemiataxia with right hypoesthesia and jacksonian march type dysesthesias of RLE followed by RUE and face.  She required sedation prior to x-ray and MRI brain done revealing severe atherosclerotic disease with severe stenosis proximal BA.     She underwent cerebral angiogram revealing 85% stenosis proximal to be an ulcerated plaque in left carotid bulb.  She underwent angioplasty with stenting of VA with complete resolution of stenosis on 11/03.  Postprocedure was found to have left facial droop with bidirectional horizontal nystagmus, decrease in left eye closure and right upper/right lower extremity drift concerning for brainstem infarct.  CT of head done revealing interval development of small acute left cerebellar infarcts and no bleed.  She was started on IV heparin as well as Cleviprex to  control and Eliquis resumed 11/05.  Aspirin was DC'd and she continues on Brilinta.  She continued to be limited by nystagmus, dysmetria with hemiplegia, sensory deficits on the right as well as ataxia.  CIR was recommended due to functional decline.  Spent a few days in ICU after the periprocedural CVA.    She was admitted to rehab 02/04/2022 and continued on Eliquis and Brilinta during her stay.  She did report right knee pain with activity.  Voltaren gel and knee sleeve were added for local measures.  Gabapentin has also been added for neuropathic pain right hand.  Dr. Kieth Brightlyodenbough neuropsychologist has worked with patient on coping and adjustment issues but she reported a lot of stress prior to and after CVA with overall stable mood  Since leaving the hospital she has taken potassium for a low discharge k level,  Melatonin was for sleep-wake disruption which has worked well in combination with gabapentin..  Constipation has resolved with use of senna twice daily, but she has continued to require it.  .  She has continued to make great progress with HHPT and HHOT after discharge.  patient reports  improvement in functional use RUE  and RLE as well as improvement in awareness.  She is able to complete ADL tasks at modified independent level, denying any falls since coming home.  She is tolerating regular diet without any signs or symptoms of aspiration and speech therapy signed off 11/10 and patient reports she hasn't choked on food at all..  She is modified independent for transfers and to ambulate household distances with use of rollator. Supervision is recommended when in community setting.  Family can provide assistance necessary after discharge.  Still not driving or strenuous activity till cleared by MD , but hasn't had any seizure active  Assessment & Plan: Checked vision its good Still encouraged having neurology agree before she drives but on my exam I have no reservations.  She seems to be  clear headed and good vision- I didn't test for hemineglect but she has good peripheral vision both sides. Also can't feel her R foot on the pedal well.    Orders: -     Ticagrelor; Take 1 tablet (90 mg total) by mouth 2 (two) times daily.  Dispense: 180 tablet; Refill: 3  Paroxysmal atrial fibrillation (HCC) Overview: Consistent with brilinta/eliquis as was planned at discharge from hospital for CVA 02/2022  Orders: -     Losartan Potassium; Take 1 tablet (50 mg total) by mouth daily.  Dispense: 90 tablet; Refill: 3 -     Atorvastatin Calcium; Take 1 tablet (40 mg total) by mouth daily.  Dispense: 90 tablet; Refill: 3 -     Apixaban; Take 1 tablet (5 mg total) by mouth 2 (two) times daily.  Dispense: 180 tablet; Refill: 3 -     amLODIPine Besylate; Take 1 tablet (5 mg total) by mouth daily.  Dispense: 90 tablet; Refill: 3  Hypertension, unspecified type Assessment & Plan: Individualized Hypertension Management: Stable, un controlled at  BP Readings from Last 1 Encounters:  02/28/22 (!) 155/89  I explained that her blood pressure is high but she reported it is normal at home before she came in that she has history of whitecoat hypertension and I also presume that she will monitor at home so we will change the medicine today.  We did discuss a little bit about a goal blood pressure because I think that is a difficult thing to decide for her but definitely want to be at least on average under 140/90 and probably better  Resistant/secondary hypertension workup: not necessary in my opinion Current hypertension medications:       Sig   amLODipine (NORVASC) 5 MG tablet (Taking) Take 1 tablet (5 mg total) by mouth daily.   losartan (COZAAR) 50 MG tablet (Taking) Take 1 tablet (50 mg total) by mouth daily.       Adjust medications as follows:  No change but need home monitorin  Standardized hypertension counseling and management: Counseled her to limit: salt, alcohol, NSAIDS, excess body  weight. Have explained risks of poor control are FUTURE stroke and heart attacks Encouragement for home blood pressure monitoring at least daily Explained Red Flag symptoms for ER: if blood pressure over 180 AND new headache, shortness of breath, confusion, or chest discomfort or new stroke symptoms See AFTER VISIT SUMMARY for addition educational information provided Offered to refill meds.    Hypokalemia -     Basic metabolic panel  Chronic idiopathic constipation -     Sennosides-Docusate Sodium; Take 1 tablet by mouth 2 (two) times daily.  Dispense: 180 tablet; Refill: 3  Hypertensive retinopathy, unspecified laterality  Insomnia due to medical condition -     Melatonin; Take 1 tablet (5 mg total) by mouth at bedtime.  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  Dispense: 90 capsule; Refill: 1  PAD (peripheral artery disease) (HCC) -     Atorvastatin Calcium; Take 1 tablet (40 mg total) by mouth daily.  Dispense: 90 tablet; Refill: 3   Today's visit  is a problem focused visit, but preventive care maintenance concerns were considered by the physician in deciding follow up -  she hasn't had Primary Care Provider (PCP) in long time so will return to clinic 1-3 month(s) for Annual Wellness Visit (AWV)  Health Maintenance  Topic Date Due   Medicare Annual Wellness (AWV)  Never done   Hepatitis C Screening  Never done   DTaP/Tdap/Td (1 - Tdap) Never done   COLONOSCOPY (Pts 45-21yrs Insurance coverage will need to be confirmed)  Never done   MAMMOGRAM  Never done   Zoster Vaccines- Shingrix (1 of 2) Never done   Pneumonia Vaccine 42+ Years old (1 - PCV) Never done   DEXA SCAN  Never done   HPV VACCINES  Aged Out   INFLUENZA VACCINE  Discontinued   COVID-19 Vaccine  Discontinued      Subjective:  Patient presents today to establish care.  Chief Complaint  Patient presents  with   Establish Care    Recently had a stroke 10/30.   Medication Refill  Was having blood pressure managed by cardiology prior to stroke.   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  She was found to have elevated blood pressure and CT head done showing patchy hypodensity lentiform nuclei bilaterally.  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  MRI was not done due to history of claustrophobia.  [facility] was [facility] and exam revealed right hemiataxia with right hypoesthesia and jacksonian march type dysesthesias of RLE followed by RUE and face.  She required sedation prior to x-ray and MRI brain done revealing severe atherosclerotic disease with severe stenosis proximal BA.     She underwent cerebral angiogram revealing 85% stenosis proximal to be an ulcerated plaque in left carotid bulb.  She underwent angioplasty with stenting of VA with complete resolution of stenosis on 11/03.  Postprocedure was found to have left facial droop with bidirectional horizontal nystagmus, decrease in left eye closure and right upper/right lower extremity drift concerning for brainstem infarct.  CT of head done revealing interval development of small acute left cerebellar infarcts and no bleed.  She was started on IV heparin as well as Cleviprex to control and Eliquis resumed 11/05.  Aspirin was DC'd and she continues on Brilinta.  She continued to be limited by nystagmus, dysmetria with hemiplegia, sensory deficits on the right as well as ataxia.  CIR was recommended due to functional decline.  Spent  a few days in ICU after the periprocedural CVA.    She was admitted to rehab 02/04/2022 and continued on Eliquis and Brilinta during her stay.  She did report right knee pain with activity.  Voltaren gel and knee sleeve were added for local measures.  Gabapentin has also been added for neuropathic pain right hand.  Dr. Kieth Brightly neuropsychologist has worked with patient on coping and adjustment issues but she reported a lot of stress prior to and after CVA with overall stable mood  Since leaving the hospital she has taken potassium for a low discharge k level,  Melatonin was for sleep-wake disruption which has worked well in combination with gabapentin..  Constipation  has resolved with use of senna twice daily, but she has continued to require it.  .  She has continued to make great progress with HHPT and HHOT after discharge.  patient reports  improvement in functional use RUE  and RLE as well as improvement in awareness.  She is able to complete ADL tasks at modified independent level, denying any falls since coming home.  She is tolerating regular diet without any signs or symptoms of aspiration and speech therapy signed off 11/10 and patient reports she hasn't choked on food at all..  She is modified independent for transfers and to ambulate household distances with use of rollator. Supervision is recommended when in community setting.  Family can provide assistance necessary after discharge.   Still not driving or strenuous activity till cleared by MD , but hasn't had any seizure active   Paroxysmal Atrial Fibrillation (Hcc)   Consistent with brilinta/eliquis as was planned at discharge from hospital for CVA 02/2022   Hypertension     Depression Screen    02/26/2022    1:28 PM  PHQ 2/9 Scores  PHQ - 2 Score 2  PHQ- 9 Score 7   No results found for any visits on 02/28/22.   The following were reviewed and entered/updated in epic: Past Medical History:  Diagnosis Date   Acute CVA  (cerebrovascular accident) (HCC) 01/27/2022   HTN (hypertension) 01/27/2022   PAF (paroxysmal atrial fibrillation) (HCC)    TIA (transient ischemic attack)    Past Surgical History:  Procedure Laterality Date   EYE SURGERY     IR ANGIO INTRA EXTRACRAN SEL COM CAROTID INNOMINATE BILAT MOD SED  01/30/2022   IR ANGIO VERTEBRAL SEL VERTEBRAL UNI L MOD SED  01/30/2022   IR ANGIO VERTEBRAL SEL VERTEBRAL UNI L MOD SED  01/31/2022   IR ANGIOGRAM FOLLOW UP STUDY  01/31/2022   IR CT HEAD LTD  01/31/2022   IR INTRA CRAN STENT  01/31/2022   IR US GUIDE VASC ACCESS LEFT  01/31/2022   IR US GUIDE VASC ACCESS RIGHT  01/30/2022   RADIOLOGY WITH ANESTHESIA N/A 01/30/2022   Procedure: MRI WITH ANESTHESIA - BRAIN W/O CONSTRAST;  Surgeon: Radiologist, Medication, MD;  Location: [facility] OR;  Service: Radiology;  Laterality: N/A;   RADIOLOGY WITH ANESTHESIA N/A 01/31/2022   Procedure: RADIOLOGY WITH ANESTHESIA;  Surgeon: Radiologist, Medication, MD;  Location: [facility] OR;  Service: Radiology;  Laterality: N/A;   Family History  Problem Relation Age of Onset   Alcoholism Father    Arrhythmia Brother    Diabetes Brother    Mental illness Brother    Outpatient Medications Prior to Visit  Medication Sig Dispense Refill   fluticasone (FLONASE) 50 MCG/ACT nasal spray Use 1 spray in each nostril once daily. 16 g 0   loratadine (CLARITIN) 10 MG tablet Take 1 tablet (10 mg total) by mouth daily. 30 tablet 0   potassium chloride (KLOR-CON M) 10 MEQ tablet Take 1 tablet (10 mEq total) by mouth daily. 30 tablet 0   amLODipine (NORVASC) 5 MG tablet Take 1 tablet (5 mg total) by mouth daily. 30 tablet 0   apixaban (ELIQUIS) 5 MG TABS tablet Take 1 tablet (5 mg total) by mouth 2 (two) times daily. 60 tablet 0   atorvastatin (LIPITOR) 40 MG tablet Take 1 tablet (40 mg total) by mouth daily. 30 tablet 0   gabapentin (NEURONTIN) 100 MG capsule Take 1 capsule (100 mg total) by mouth at bedtime. 30 capsule 1  losartan (COZAAR) 50 MG tablet  Take 1 tablet (50 mg total) by mouth daily. 30 tablet 0   melatonin 5 MG TABS Take 1 tablet (5 mg total) by mouth at bedtime. 30 tablet 0   senna-docusate (SENOKOT-S) 8.6-50 MG tablet Take 1 tablet by mouth 2 (two) times daily. 60 tablet 0   ticagrelor (BRILINTA) 90 MG TABS tablet Take 1 tablet (90 mg total) by mouth 2 (two) times daily. 60 tablet 0   diclofenac Sodium (VOLTAREN) 1 % GEL Apply 2 g topically 4 (four) times daily. (Patient not taking: Reported on 02/28/2022) 350 g 0   No facility-administered medications prior to visit.    Allergies  Allergen Reactions   Codeine    Social History   Tobacco Use   Smoking status: Never   Smokeless tobacco: Never  Vaping Use   Vaping Use: Never used  Substance Use Topics   Alcohol use: No   Drug use: No    Immunization History  Administered Date(s) Administered   PFIZER(Purple Top)SARS-COV-2 Vaccination 04/24/2019, 05/16/2019, 04/06/2020    Objective:  BP (!) 155/89 (BP Location: Right Arm, Patient Position: Sitting)   Pulse 60   Temp 98.1 F (36.7 C) (Temporal)   Resp 12   Ht 5' 3.5" (1.613 m)   Wt 156 lb 3.2 oz (70.9 kg)   SpO2 99%   BMI 27.24 kg/m  Body mass index is 27.24 kg/m.   Physical Exam Vitals and nursing note reviewed.  Constitutional:      General: She is not in acute distress.    Appearance: Normal appearance. She is not ill-appearing, toxic-appearing or diaphoretic.  HENT:     Head: Normocephalic and atraumatic.     Nose: Nose normal.     Mouth/Throat:     Mouth: Mucous membranes are moist.  Eyes:     General: No scleral icterus.    Conjunctiva/sclera: Conjunctivae normal.  Pulmonary:     Effort: Pulmonary effort is normal. No respiratory distress.  Neurological:     Mental Status: She is alert.  Psychiatric:        Mood and Affect: Mood normal.        Behavior: Behavior normal.    Very mild R facial droop only really visible now with full smile Ambulates with walker, but can stand and move  unsteadily without it. R sided weakness.  Results Reviewed: Results for orders placed or performed during the hospital encounter of 02/04/22  Comprehensive metabolic panel  Result Value Ref Range   Sodium 142 135 - 145 mmol/L   Potassium 3.6 3.5 - 5.1 mmol/L   Chloride 111 98 - 111 mmol/L   CO2 23 22 - 32 mmol/L   Glucose, Bld 104 (H) 70 - 99 mg/dL   BUN 16 8 - 23 mg/dL   Creatinine, Ser 4.54 0.44 - 1.00 mg/dL   Calcium 8.9 8.9 - 09.8 mg/dL   Total Protein 6.0 (L) 6.5 - 8.1 g/dL   Albumin 3.3 (L) 3.5 - 5.0 g/dL   AST 31 15 - 41 U/L   ALT 45 (H) 0 - 44 U/L   Alkaline Phosphatase 51 38 - 126 U/L   Total Bilirubin 0.9 0.3 - 1.2 mg/dL   GFR, Estimated >11 >91 mL/min   Anion gap 8 5 - 15  CBC with Differential/Platelet  Result Value Ref Range   WBC 6.8 4.0 - 10.5 K/uL   RBC 4.62 3.87 - 5.11 MIL/uL   Hemoglobin 14.2 12.0 - 15.0 g/dL  HCT 42.4 36.0 - 46.0 %   MCV 91.8 80.0 - 100.0 fL   MCH 30.7 26.0 - 34.0 pg   MCHC 33.5 30.0 - 36.0 g/dL   RDW 16.1 09.6 - 04.5 %   Platelets 256 150 - 400 K/uL   nRBC 0.0 0.0 - 0.2 %   Neutrophils Relative % 72 %   Neutro Abs 4.9 1.7 - 7.7 K/uL   Lymphocytes Relative 17 %   Lymphs Abs 1.1 0.7 - 4.0 K/uL   Monocytes Relative 8 %   Monocytes Absolute 0.5 0.1 - 1.0 K/uL   Eosinophils Relative 3 %   Eosinophils Absolute 0.2 0.0 - 0.5 K/uL   Basophils Relative 0 %   Basophils Absolute 0.0 0.0 - 0.1 K/uL   Immature Granulocytes 0 %   Abs Immature Granulocytes 0.03 0.00 - 0.07 K/uL  Basic metabolic panel  Result Value Ref Range   Sodium 140 135 - 145 mmol/L   Potassium 3.4 (L) 3.5 - 5.1 mmol/L   Chloride 110 98 - 111 mmol/L   CO2 20 (L) 22 - 32 mmol/L   Glucose, Bld 97 70 - 99 mg/dL   BUN 16 8 - 23 mg/dL   Creatinine, Ser 4.09 0.44 - 1.00 mg/dL   Calcium 8.6 (L) 8.9 - 10.3 mg/dL   GFR, Estimated >81 >19 mL/min   Anion gap 10 5 - 15  CBC  Result Value Ref Range   WBC 7.8 4.0 - 10.5 K/uL   RBC 4.27 3.87 - 5.11 MIL/uL   Hemoglobin 13.5  12.0 - 15.0 g/dL   HCT 14.7 82.9 - 56.2 %   MCV 93.0 80.0 - 100.0 fL   MCH 31.6 26.0 - 34.0 pg   MCHC 34.0 30.0 - 36.0 g/dL   RDW 13.0 86.5 - 78.4 %   Platelets 268 150 - 400 K/uL   nRBC 0.0 0.0 - 0.2 %

## 2022-02-28 NOTE — Assessment & Plan Note (Signed)
Checked vision its good Still encouraged having neurology agree before she drives but on my exam I have no reservations.  She seems to be clear headed and good vision- I didn't test for hemineglect but she has good peripheral vision both sides. Also can't feel her R foot on the pedal well.

## 2022-02-28 NOTE — Assessment & Plan Note (Signed)
Individualized Hypertension Management: Stable, un controlled at  BP Readings from Last 1 Encounters:  02/28/22 (!) 155/89  I explained that her blood pressure is high but she reported it is normal at home before she came in that she has history of whitecoat hypertension and I also presume that she will monitor at home so we will change the medicine today.  We did discuss a little bit about a goal blood pressure because I think that is a difficult thing to decide for her but definitely want to be at least on average under 140/90 and probably better  Resistant/secondary hypertension workup: not necessary in my opinion Current hypertension medications:       Sig   amLODipine (NORVASC) 5 MG tablet (Taking) Take 1 tablet (5 mg total) by mouth daily.   losartan (COZAAR) 50 MG tablet (Taking) Take 1 tablet (50 mg total) by mouth daily.       Adjust medications as follows:  No change but need home monitorin  Standardized hypertension counseling and management: Counseled her to limit: salt, alcohol, NSAIDS, excess body weight. Have explained risks of poor control are FUTURE stroke and heart attacks Encouragement for home blood pressure monitoring at least daily Explained Red Flag symptoms for ER: if blood pressure over 180 AND new headache, shortness of breath, confusion, or chest discomfort or new stroke symptoms See AFTER VISIT SUMMARY for addition educational information provided Offered to refill meds.

## 2022-03-01 LAB — BASIC METABOLIC PANEL
BUN: 17 mg/dL (ref 7–25)
CO2: 21 mmol/L (ref 20–32)
Calcium: 8.8 mg/dL (ref 8.6–10.4)
Chloride: 109 mmol/L (ref 98–110)
Creat: 0.75 mg/dL (ref 0.60–1.00)
Glucose, Bld: 93 mg/dL (ref 65–99)
Potassium: 3.5 mmol/L (ref 3.5–5.3)
Sodium: 143 mmol/L (ref 135–146)

## 2022-03-04 ENCOUNTER — Telehealth: Payer: Self-pay

## 2022-03-04 NOTE — Telephone Encounter (Signed)
Rosalita Chessman Occupational Therapist with [facility] called:   Per discharge orders Velna Hatchet Lobo's blood pressure should be no higher than 150/80. Today before in-home Occupational Therapy it was 158/88. After therapy it was 152/84. (With no symptomatic problems).  Caller has been informed the readings will be reported to Dr. Wynn Banker. And to notify the patient PCP.

## 2022-03-11 ENCOUNTER — Emergency Department (HOSPITAL_COMMUNITY)
Admission: EM | Admit: 2022-03-11 | Discharge: 2022-03-11 | Disposition: A | Discharge status: Home / Self Care | Payer: Medicare HMO | Attending: Emergency Medicine | Admitting: Emergency Medicine

## 2022-03-11 ENCOUNTER — Encounter (HOSPITAL_COMMUNITY): Payer: Self-pay | Admitting: *Deleted

## 2022-03-11 ENCOUNTER — Telehealth: Payer: Self-pay | Admitting: Cardiovascular Disease

## 2022-03-11 ENCOUNTER — Other Ambulatory Visit: Payer: Self-pay

## 2022-03-11 ENCOUNTER — Telehealth: Payer: Self-pay | Admitting: Internal Medicine

## 2022-03-11 ENCOUNTER — Emergency Department (HOSPITAL_COMMUNITY): Payer: Medicare HMO

## 2022-03-11 DIAGNOSIS — R531 Weakness: Secondary | ICD-10-CM | POA: Diagnosis not present

## 2022-03-11 DIAGNOSIS — I959 Hypotension, unspecified: Secondary | ICD-10-CM | POA: Diagnosis not present

## 2022-03-11 DIAGNOSIS — K449 Diaphragmatic hernia without obstruction or gangrene: Secondary | ICD-10-CM | POA: Diagnosis not present

## 2022-03-11 DIAGNOSIS — N3001 Acute cystitis with hematuria: Secondary | ICD-10-CM | POA: Diagnosis not present

## 2022-03-11 DIAGNOSIS — I4891 Unspecified atrial fibrillation: Secondary | ICD-10-CM | POA: Diagnosis not present

## 2022-03-11 DIAGNOSIS — Z79899 Other long term (current) drug therapy: Secondary | ICD-10-CM | POA: Insufficient documentation

## 2022-03-11 DIAGNOSIS — I480 Paroxysmal atrial fibrillation: Secondary | ICD-10-CM | POA: Diagnosis not present

## 2022-03-11 DIAGNOSIS — R000 Tachycardia, unspecified: Secondary | ICD-10-CM | POA: Diagnosis not present

## 2022-03-11 DIAGNOSIS — I10 Essential (primary) hypertension: Secondary | ICD-10-CM | POA: Diagnosis not present

## 2022-03-11 DIAGNOSIS — Z7901 Long term (current) use of anticoagulants: Secondary | ICD-10-CM | POA: Diagnosis not present

## 2022-03-11 DIAGNOSIS — Z8679 Personal history of other diseases of the circulatory system: Secondary | ICD-10-CM

## 2022-03-11 LAB — COMPREHENSIVE METABOLIC PANEL
ALT: 21 U/L (ref 0–44)
AST: 20 U/L (ref 15–41)
Albumin: 3.5 g/dL (ref 3.5–5.0)
Alkaline Phosphatase: 64 U/L (ref 38–126)
Anion gap: 9 (ref 5–15)
BUN: 10 mg/dL (ref 8–23)
CO2: 22 mmol/L (ref 22–32)
Calcium: 8.8 mg/dL — ABNORMAL LOW (ref 8.9–10.3)
Chloride: 113 mmol/L — ABNORMAL HIGH (ref 98–111)
Creatinine, Ser: 0.76 mg/dL (ref 0.44–1.00)
GFR, Estimated: >60 mL/min (ref >60)
Glucose, Bld: 94 mg/dL (ref 70–99)
Potassium: 3.4 mmol/L — ABNORMAL LOW (ref 3.5–5.1)
Sodium: 144 mmol/L (ref 135–145)
Total Bilirubin: 0.7 mg/dL (ref 0.3–1.2)
Total Protein: 6.2 g/dL — ABNORMAL LOW (ref 6.5–8.1)

## 2022-03-11 LAB — URINALYSIS, ROUTINE W REFLEX MICROSCOPIC
Bilirubin Urine: NEGATIVE
Glucose, UA: NEGATIVE mg/dL
Ketones, ur: NEGATIVE mg/dL
Nitrite: POSITIVE — AB
Protein, ur: 30 mg/dL — AB
Specific Gravity, Urine: 1.021 (ref 1.005–1.030)
pH: 5 (ref 5.0–8.0)

## 2022-03-11 LAB — CBC WITH DIFFERENTIAL/PLATELET
Abs Immature Granulocytes: 0.03 10*3/uL (ref 0.00–0.07)
Basophils Absolute: 0 10*3/uL (ref 0.0–0.1)
Basophils Relative: 0 %
Eosinophils Absolute: 0.1 10*3/uL (ref 0.0–0.5)
Eosinophils Relative: 2 %
HCT: 40.3 % (ref 36.0–46.0)
Hemoglobin: 13.3 g/dL (ref 12.0–15.0)
Immature Granulocytes: 0 %
Lymphocytes Relative: 15 %
Lymphs Abs: 1.1 10*3/uL (ref 0.7–4.0)
MCH: 31.1 pg (ref 26.0–34.0)
MCHC: 33 g/dL (ref 30.0–36.0)
MCV: 94.4 fL (ref 80.0–100.0)
Monocytes Absolute: 0.5 10*3/uL (ref 0.1–1.0)
Monocytes Relative: 7 %
Neutro Abs: 5.7 10*3/uL (ref 1.7–7.7)
Neutrophils Relative %: 76 %
Platelets: 264 10*3/uL (ref 150–400)
RBC: 4.27 MIL/uL (ref 3.87–5.11)
RDW: 13.2 % (ref 11.5–15.5)
WBC: 7.5 10*3/uL (ref 4.0–10.5)
nRBC: 0 % (ref 0.0–0.2)

## 2022-03-11 MED ORDER — SODIUM CHLORIDE 0.9 % IV SOLN
1.0000 g | Freq: Once | INTRAVENOUS | Status: AC
  Administered 2022-03-11: 1 g via INTRAVENOUS
  Filled 2022-03-11: qty 10

## 2022-03-11 MED ORDER — CEPHALEXIN 500 MG PO CAPS: 500.0000 mg | ORAL_CAPSULE | Freq: Four times a day (QID) | ORAL | 0 refills | Status: DC

## 2022-03-11 NOTE — ED Provider Notes (Signed)
[facility] EMERGENCY DEPARTMENT Provider Note   CSN: [id number] Arrival date & time: 03/11/22  2202     History  Chief Complaint  Patient presents with   Weakness    Anita Fletcher is a 76 y.o. female.   Weakness Associated symptoms: no abdominal pain, no chest pain, no cough, no dizziness, no dysuria, no fever, no headaches, no nausea, no shortness of breath and no vomiting        Anita Fletcher is a 76 y.o. 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  She states that she woke this morning and "did not feel good" she checked the heart rate app on her phone and it read that she was in A-fib.  She called her primary care provider and was instructed to call 911.  On MS arrival, patient was noted to be in A-fib with RVR, rate ranging from 1 20-1 60 per EMS she was given a 250 cc bolus of saline and rate converted to sinus rhythm.  Patient reports that she immediately felt better.  She denies any chest pain or shortness of breath, headache or dizziness at this time.  She denies any recent illness, fever or chills.  No recent medication changes.  She has been on Brilinta and Eliquis since May of this year when she was diagnosed with A-fib.  She does endorse some persistent right-sided numbness and weakness secondary to her stroke from October.  Her symptoms are continuing to improve with PT  Home Medications Prior to Admission medications   Medication Sig Start Date End Date Taking? Authorizing Provider  amLODipine (NORVASC) 5 MG tablet Take 1 tablet (5 mg total) by mouth daily. 02/28/22   Lula Olszewski, MD  apixaban (ELIQUIS) 5 MG TABS tablet Take 1 tablet (5 mg total) by mouth 2 (two) times daily. 02/28/22   Lula Olszewski, MD  atorvastatin (LIPITOR) 40 MG tablet Take 1 tablet (40 mg total) by mouth daily. 02/28/22   Lula Olszewski, MD  diclofenac  Sodium (VOLTAREN) 1 % GEL Apply 2 g topically 4 (four) times daily. Patient not taking: Reported on 02/28/2022 02/13/22   Love, Evlyn Kanner, PA-C  fluticasone [facility]) 50 MCG/ACT nasal spray Use 1 spray in each nostril once daily. 02/14/22   Love, Evlyn Kanner, PA-C  gabapentin (NEURONTIN) 100 MG capsule Take 1 capsule (100 mg total) by mouth at bedtime. 02/28/22   Lula Olszewski, MD  loratadine (CLARITIN) 10 MG tablet Take 1 tablet (10 mg total) by mouth daily. 02/14/22   Love, Evlyn Kanner, PA-C  losartan (COZAAR) 50 MG tablet Take 1 tablet (50 mg total) by mouth daily. 02/28/22   Lula Olszewski, MD  melatonin 5 MG TABS Take 1 tablet (5 mg total) by mouth at bedtime. 02/28/22   Lula Olszewski, MD  potassium chloride (KLOR-CON M) 10 MEQ tablet Take 1 tablet (10 mEq total) by mouth daily. 02/14/22   Love, Evlyn Kanner, PA-C  senna-docusate (SENOKOT-S) 8.6-50 MG tablet Take 1 tablet by mouth 2 (two) times daily. 02/28/22   Lula Olszewski, MD  ticagrelor (BRILINTA) 90 MG TABS tablet Take 1 tablet (90 mg total) by mouth 2 (two) times daily. 02/28/22   Lula Olszewski, MD      Allergies    Codeine    Review of Systems   Review of Systems  Constitutional:  Negative for appetite change, chills and fever.  HENT:  Negative for sore throat and trouble swallowing.   Eyes:  Negative for visual disturbance.  Respiratory:  Negative for cough and shortness of breath.   Cardiovascular:  Negative for chest pain, palpitations and leg swelling.  Gastrointestinal:  Negative for abdominal pain, nausea and vomiting.  Genitourinary:  Negative for difficulty urinating, dysuria and flank pain.  Musculoskeletal:  Negative for neck pain and neck stiffness.  Skin:  Negative for rash.  Neurological:  Positive for weakness and light-headedness. Negative for dizziness, syncope, speech difficulty and headaches.  Psychiatric/Behavioral:  Negative for confusion.     Physical Exam Updated Vital Signs Temp (!) 97.5 F (36.4 C)  (Oral)   Ht 5' 3.5" (1.613 m)   Wt 70.8 kg   BMI 27.20 kg/m  Physical Exam Vitals and nursing note reviewed.  Constitutional:      General: She is not in acute distress.    Appearance: Normal appearance. She is not ill-appearing or toxic-appearing.  HENT:     Head: Atraumatic.     Mouth/Throat:     Mouth: Mucous membranes are moist.  Eyes:     Extraocular Movements: Extraocular movements intact.     Pupils: Pupils are equal, round, and reactive to light.  Neck:     Trachea: Phonation normal.     Meningeal: Kernig's sign absent.  Cardiovascular:     Rate and Rhythm: Normal rate and regular rhythm.     Pulses: Normal pulses.  Pulmonary:     Effort: No respiratory distress.     Breath sounds: Normal breath sounds. No wheezing.  Abdominal:     Palpations: Abdomen is soft.     Tenderness: There is no abdominal tenderness.  Musculoskeletal:        General: Normal range of motion.     Cervical back: Normal range of motion. No rigidity or tenderness.     Right lower leg: No edema.     Left lower leg: No edema.  Lymphadenopathy:     Cervical: No cervical adenopathy.  Skin:    General: Skin is warm.     Capillary Refill: Capillary refill takes less than 2 seconds.  Neurological:     Mental Status: She is alert and oriented to person, place, and time.     Sensory: No sensory deficit.     Motor: Weakness present.     Coordination: Coordination is intact. Coordination normal.     Comments: Grip strength slightly diminished on right compared to left.  Baseline per patient since prior stroke.  No dysarthria, no facial droop     ED Results / Procedures / Treatments   Labs (all labs ordered are listed, but only abnormal results are displayed) Labs Reviewed  URINALYSIS, ROUTINE W REFLEX MICROSCOPIC - Abnormal; Notable for the following components:      Result Value   Color, Urine AMBER (*)    APPearance CLOUDY (*)    Hgb urine dipstick SMALL (*)    Protein, ur 30 (*)     Nitrite POSITIVE (*)    Leukocytes,Ua MODERATE (*)    Bacteria, UA RARE (*)    All other components within normal limits  COMPREHENSIVE METABOLIC PANEL - Abnormal; Notable for the following components:   Potassium 3.4 (*)    Chloride 113 (*)    Calcium 8.8 (*)    Total Protein 6.2 (*)    All other components within normal limits  URINE CULTURE  CBC  WITH DIFFERENTIAL/PLATELET    EKG EKG Interpretation  Date/Time:  Tuesday March 11 2022 09:52:56 EST Ventricular Rate:  70 PR Interval:  167 QRS Duration: 105 QT Interval:  428 QTC Calculation: 462 R Axis:   24 Text Interpretation: Sinus rhythm Anteroseptal infarct, age indeterminate Artifact Abnormal ECG Confirmed by Gerhard Munch [phone]) on 03/11/2022 10:01:52 AM  Radiology DG Chest Portable 1 View  Result Date: 03/11/2022 CLINICAL DATA:  Weakness EXAM: PORTABLE CHEST 1 VIEW COMPARISON:  01/31/2022 FINDINGS: Stable heart size. Aortic atherosclerosis. Large hiatal hernia. No focal airspace consolidation, pleural effusion, or pneumothorax. IMPRESSION: 1. No active disease. 2. Large hiatal hernia. Electronically Signed   By: Duanne Guess D.O.   On: 03/11/2022 10:37    Procedures Procedures    Medications Ordered in ED Medications - No data to display  ED Course/ Medical Decision Making/ A&P                           Medical Decision Making Patient here brought in by EMS for evaluation of A-fib with RVR.  EMS gave saline bolus and patient converted to sinus rhythm and reports that she immediately felt better.  On arrival, patient denying any symptoms.  She also denies any new medication changes or recent illness.  On my exam, patient well-appearing, nontoxic.  She has some right-sided upper extremity weakness that she reports is baseline from previous stroke.  No facial droop or dysarthria.  Heart regular rate and rhythm.  Patient with known history of paroxysmal atrial fibrillation, currently anticoagulated with  Eliquis and is also on Brilinta.  Cause for her A-fib with RVR unclear at this time.  Will evaluate with chest x-ray, EKG and labs with urine to rule out infectious cause.  No increased focal neurological deficits, low clinical suspicion for recurrent CVA/TIA.  Amount and/or Complexity of Data Reviewed Labs: ordered.    Details: Labs interpreted by me, no evidence of leukocytosis, hemoglobin unremarkable.  Chemistries without significant derangement, urinalysis shows positive nitrites, moderate leukocytes with 21-50 white cells.  I suspect this is related to cystitis urine culture pending Radiology: ordered.    Details: Chest x-ray without evidence of no active disease ECG/medicine tests: ordered.    Details: EKG read by Dr. Jeraldine Loots, shows sinus rhythm Discussion of management or test interpretation with external provider(s): On recheck, patient resting comfortably.  No recurrent RVR.  Patient denies any symptoms.  Reports feeling better.  She is nontoxic-appearing.  Her workup today shows a likely cystitis.  Treated here with Rocephin and urine culture pending.  Feel patient is appropriate for discharge home, will start her on cephalexin and she is agreeable to close outpatient follow-up with her PCP.  Return precautions discussed.           Final Clinical Impression(s) / ED Diagnoses Final diagnoses:  Acute cystitis with hematuria  History of atrial fibrillation    Rx / DC Orders ED Discharge Orders     None         Pauline Aus, PA-C 03/11/22 1430    Gerhard Munch, MD 03/12/22 [phone]

## 2022-03-11 NOTE — Telephone Encounter (Signed)
Patient states she is in possible afib per [employer] devise.  BP 133/92 pulse 127   Not having any other symptoms at the moment. Sounds worried and mumbling words. Patient has been triaged.   Awaiting for notes.

## 2022-03-11 NOTE — ED Triage Notes (Signed)
Pt brought in by [facility] from home with c/o SOB and extremely weak. EMS came out and found pt to be in A. Fib with HR ranging between 120-160bpm, extremely tachypneic, BP 105/50, O2 sat 98% on RA. Pt's SBP then dropped into the 90s and EMS gave a NS bolus. After the bolus, pt's heart rhythm converted to NSR with HR ranging 70-80 and SBP 130. Pt denies any SOB currently, but does report feeling "a little lightheaded".

## 2022-03-11 NOTE — Discharge Instructions (Signed)
Your workup today shows that you have a urinary tract infection.  It is important that you drink plenty of fluids and take the antibiotics as directed until finished.  Please follow-up with your primary care provider after the antibiotics are finished.  Return to the emergency department for any new or worsening symptoms.  You may start your prescription antibiotics tomorrow.

## 2022-03-11 NOTE — Telephone Encounter (Signed)
Called patient, she was in the ED this morning, she states when she left she went to get her refill of the Eliquis and it was $400. She states it has been $47. I advised it sounded like she was in the donut hole for the end of the year.   I did provide samples for the next few weeks to see if this could get her into the new year, and it would restart.   Patient thankful. I did advise if she had issues with the medication to let us know.

## 2022-03-11 NOTE — Telephone Encounter (Signed)
  Patient Name: Anita Fletcher D Gender: Female DOB: 1945-11-18 Age: 76 Y 9 M 28 D Return Phone Number: [phone] (Primary) [place] [address] Client [facility] [employer] [facility] Provider Glenetta Hew- MD Contact Type Call Who Is Calling Patient / Member / Family / Caregiver Call Type Triage / Clinical Relationship To Patient Self Return Phone Number [phone] (Primary) Chief Complaint Heart palpitations or irregular heartbeat Reason for Call Symptomatic / Request for Health Information Initial Comment Caller is concerned they are in afib. BP 133/92, heart rate is 127. Translation No Nurse Assessment Nurse: Stefano Gaul, RN, Dwana Curd Date/Time (Eastern Time): 03/11/2022 8:18:17 AM Confirm and document reason for call. If symptomatic, describe symptoms. ---Caller states her BP 72/51 and her pulse is 108. Earlier pulse was 127 and BP 133/92. she is dizzy. she is SOB. Does the patient have any new or worsening symptoms? ---Yes Will a triage be completed? ---Yes Related visit to physician within the last 2 weeks? ---No Does the PT have any chronic conditions? (i.e. diabetes, asthma, this includes High risk factors for pregnancy, etc.) ---Yes List chronic conditions. ---CVA; A fib Is this a behavioral health or substance abuse call? ---No Guidelines Guideline Title Affirmed Question Affirmed Notes Nurse Date/Time (Eastern Time) Dizziness - Lightheadedness SEVERE difficulty breathing (e.g., struggling for each breath, speaks in single words) Stefano Gaul, RN, Dwana Curd 03/11/2022 8:21:02 AM  Disp. Time Lamount Cohen Time) Disposition Final User 03/11/2022 8:21:59 AM Call EMS 911 Now Yes Stefano Gaul, RN, Dwana Curd 03/11/2022 8:23:02 AM Send To RN Personal Stefano Gaul, RN, Dwana Curd 03/11/2022 8:27:36 AM 911 Outcome Documentation Stefano Gaul, RN, Dwana Curd Reason: attempted 911  outcome documentation and pt states EMS is on the way Final Disposition 03/11/2022 8:21:59 AM Call EMS 911 Now Yes Stefano Gaul, RN, Clerance Lav Disagree/Comply Comply Caller Understands Yes PreDisposition Did not know what to do Care Advice Given Per Guideline CALL EMS 911 NOW: * Immediate medical attention is needed. You need to hang up and call 911 (or an ambulance). CARE ADVICE given per Dizziness (Adult) guideline. Comments User: Art Buff, RN Date/Time Lamount Cohen Time): 03/11/2022 8:22:46 AM [employer] says she is in A fib

## 2022-03-11 NOTE — Telephone Encounter (Signed)
Pt c/o medication issue:  1. Name of Medication:   apixaban (ELIQUIS) 5 MG TABS tablet   2. How are you currently taking this medication (dosage and times per day)?   As prescribed  3. Are you having a reaction (difficulty breathing--STAT)?   No  4. What is your medication issue?   Patient called to get alternate medication or a generic for this drug as it is too expensive.

## 2022-03-13 LAB — URINE CULTURE: Culture: >=100000 — AB

## 2022-03-14 ENCOUNTER — Telehealth (HOSPITAL_BASED_OUTPATIENT_CLINIC_OR_DEPARTMENT_OTHER): Payer: Self-pay | Admitting: *Deleted

## 2022-03-14 ENCOUNTER — Telehealth: Payer: Self-pay | Admitting: Internal Medicine

## 2022-03-14 NOTE — Telephone Encounter (Signed)
Post ED Visit - Positive Culture Follow-up  Culture report reviewed by antimicrobial stewardship pharmacist: [facility] []  , Pharm.D. []  Enzo Bi, Pharm.D., BCPS AQ-ID []  , Pharm.D., BCPS []  Celedonio Miyamoto, .D., BCPS []  [place], .D., BCPS, AAHIVP []  Georgina Pillion, Pharm.D., BCPS, AAHIVP []  [address], PharmD, BCPS []  , PharmD, BCPS []  [place], PharmD, BCPS []  [address], PharmD []  , PharmD, BCPS []  Estella Husk, PharmD  Pharmacy Team []  Lysle Pearl, PharmD []  , PharmD []  Phillips Climes, PharmD []  , Rph []  Agapito Games) , PharmD []  Verlan Friends, PharmD []  , PharmD []  Mervyn Gay, PharmD []  , PharmD []  Vinnie Level, PharmD []  [facility], PharmD []  , PharmD []  Len Childs, PharmD   Positive urine culture Treated with Cephalexin, organism sensitive to the same and no further patient follow-up is required at this time. , PharmD  Greer Pickerel Talley 03/14/2022, 2:19 PM

## 2022-03-14 NOTE — Telephone Encounter (Signed)
Pt states: -recently seen by PCP for hosp follow up -two Rx not received at pharmacy.  Pt requests: -2 Rxs sent to pharmacy.   Rx #: [id number]  potassium chloride (KLOR-CON M) 10 MEQ tablet [[id number]]   Rx #: [id number]  loratadine (CLARITIN) 10 MG tablet [[id number]]    [id number] - [place], [place] - [address] AT [facility] [address], [place] [address] Phone: [phone]  Fax: [phone] DEA #: [id number]

## 2022-03-17 NOTE — Telephone Encounter (Signed)
Pt called in for update on rx. She is out of medication. Needing asap.

## 2022-03-18 ENCOUNTER — Telehealth: Payer: Self-pay

## 2022-03-18 ENCOUNTER — Other Ambulatory Visit: Payer: Self-pay | Admitting: Internal Medicine

## 2022-03-18 DIAGNOSIS — E876 Hypokalemia: Secondary | ICD-10-CM

## 2022-03-18 DIAGNOSIS — J302 Other seasonal allergic rhinitis: Secondary | ICD-10-CM

## 2022-03-18 MED ORDER — POTASSIUM CHLORIDE CRYS ER 10 MEQ PO TBCR: 10.0000 meq | EXTENDED_RELEASE_TABLET | Freq: Every day | ORAL | 3 refills | Status: DC

## 2022-03-18 MED ORDER — LORATADINE 10 MG PO TABS: 10.0000 mg | ORAL_TABLET | Freq: Every day | ORAL | 3 refills | Status: DC

## 2022-03-18 NOTE — Telephone Encounter (Signed)
Pt is calling again for an update. Please advise

## 2022-03-18 NOTE — Patient Outreach (Signed)
  Care Coordination TOC Note Transition Care Management Follow-up Telephone Call Date of discharge and from where: 03/11/22-[facility] ED  Dx; "acute cystitis w/ hematuria" Red on EMMI-ED Discharge Alert Reason: "Lost interest in things they used to enjoy? Yes" Red Alert Date: 03/16/22 How have you been since you were released from the hospital? Patient voices that she is "tired and does not have much energy but doing okay." She is still having some right hand pain from recent stroke. She voices she is doing hand exercises that she was taught to do. Patient shares that she is a little frustrated as she is not able to return to work yet and do a lot of the things she normally would be doing this time of the year. She is anxious to resume her normal previous activities. Patient states she is dealing and coping with it. She declined needing further support/assistance or counseling.  Any questions or concerns? No  Items Reviewed: Did the pt receive and understand the discharge instructions provided? Yes  Medications obtained and verified? No  Other? Yes  Any new allergies since your discharge? No  Dietary orders reviewed? Yes Do you have support at home? No   Home Care and Equipment/Supplies: Were home health services ordered? not applicable If so, what is the name of the agency? N/A  Has the agency set up a time to come to the patient's home? not applicable Were any new equipment or medical supplies ordered?  No What is the name of the medical supply agency? N/A Were you able to get the supplies/equipment? not applicable Do you have any questions related to the use of the equipment or supplies? No  Functional Questionnaire: (I = Independent and D = Dependent) ADLs: I  Bathing/Dressing- I  Meal Prep- I  Eating- I  Maintaining continence- I  Transferring/Ambulation- I  Managing Meds- I  Follow up appointments reviewed:  PCP Hospital f/u appt confirmed?  Yes-Dr. Jon Billings 04/03/21  . Specialist Hospital f/u appt confirmed? Yes .Will see neurologist on 04/18/22 Are transportation arrangements needed? No  If their condition worsens, is the pt aware to call PCP or go to the Emergency Dept.? Yes Was the patient provided with contact information for the PCP's office or ED? Yes Was to pt encouraged to call back with questions or concerns? Yes  SDOH assessments and interventions completed:   Yes SDOH Interventions Today    Flowsheet Row Most Recent Value  SDOH Interventions   Food Insecurity Interventions Intervention Not Indicated  Transportation Interventions Intervention Not Indicated       Care Coordination Interventions:  Education provided    Encounter Outcome:  Pt. Visit Completed    Antionette Fairy, RN,BSN,CCM [facility] Care Management Telephonic Care Management Coordinator Direct Phone: [phone] Toll Free: [phone] Fax: [phone]

## 2022-03-22 ENCOUNTER — Encounter (HOSPITAL_COMMUNITY): Payer: Self-pay

## 2022-04-02 ENCOUNTER — Ambulatory Visit: Payer: Medicare HMO | Admitting: Internal Medicine

## 2022-04-03 ENCOUNTER — Telehealth: Payer: Self-pay | Admitting: Registered Nurse

## 2022-04-03 ENCOUNTER — Ambulatory Visit (INDEPENDENT_AMBULATORY_CARE_PROVIDER_SITE_OTHER): Payer: Medicare HMO | Admitting: Internal Medicine

## 2022-04-03 ENCOUNTER — Encounter: Payer: Self-pay | Admitting: Internal Medicine

## 2022-04-03 VITALS — BP 181/94 | HR 64 | Temp 98.1°F | Ht 63.5 in | Wt 157.0 lb

## 2022-04-03 DIAGNOSIS — I10 Essential (primary) hypertension: Secondary | ICD-10-CM | POA: Diagnosis not present

## 2022-04-03 DIAGNOSIS — I6930 Unspecified sequelae of cerebral infarction: Secondary | ICD-10-CM

## 2022-04-03 DIAGNOSIS — E876 Hypokalemia: Secondary | ICD-10-CM

## 2022-04-03 DIAGNOSIS — M792 Neuralgia and neuritis, unspecified: Secondary | ICD-10-CM

## 2022-04-03 HISTORY — DX: Neuralgia and neuritis, unspecified: M79.2

## 2022-04-03 MED ORDER — DULOXETINE HCL 30 MG PO CPEP: ORAL_CAPSULE | ORAL | 3 refills | Status: DC

## 2022-04-03 MED ORDER — GABAPENTIN 100 MG PO CAPS: 100.0000 mg | ORAL_CAPSULE | Freq: Three times a day (TID) | ORAL | 1 refills | Status: DC

## 2022-04-03 MED ORDER — LIDOCAINE 5 % EX OINT: 1.0000 | TOPICAL_OINTMENT | CUTANEOUS | 0 refills | Status: DC | PRN

## 2022-04-03 MED ORDER — LOSARTAN POTASSIUM 100 MG PO TABS: 100.0000 mg | ORAL_TABLET | Freq: Every day | ORAL | 3 refills | Status: DC

## 2022-04-03 NOTE — Progress Notes (Signed)
[facility]: [id number]   Routine Medical Office Visit  Patient:  Anita Fletcher      Age: 77 y.o.       Sex:  female  Date:   04/03/2022  PCP:    Loralee Pacas, MD   [facility] Provider: Loralee Pacas, MD  Assessment/Plan:     Problem List Items Addressed This Visit       Active Problems   History of CVA with residual deficit    Offered behavioral referral and she says she is not feeling too depressed she is just going to use her faith for now She will continue with following with neurology  She seems to continue to make great progress and be independent in all ADLs now Persistent right hand neuropathic pain is still a problem, that is actually worsening.... possibly due to makes some recovery in terms of her ability to feel pain there.      Hypertension - Primary    My primary concern today  Individualized Hypertension Management: Stable due to no chest pain shortness of breath or headache , but severely controlled at  BP Readings from Last 1 Encounters:  04/03/22 (!) 181/94   Patient reports she didn't forget medications and is not stressed.  Home checks also uncontrolled  Goal blood pressure of less than 140/90 explained Resistant/secondary hypertension workup: not necessary in my opinion due to only at half dose on 2 pills Current hypertension medications:       Sig   amLODipine (NORVASC) 5 MG tablet (Taking) Take 1 tablet (5 mg total) by mouth daily.   losartan (COZAAR) 50 MG tablet (Taking) Take 1 tablet (50 mg total) by mouth daily.       Adjust medications as follows:  Losartan 50->100 mg daily and check blood pressure 2x daily  Standardized hypertension counseling and management: Counseled her to limit: salt, alcohol, NSAIDS, excess body weight. Have explained risks of poor control are FUTURE stroke and heart attacks Encouragement for home blood pressure monitoring Explained Red Flag symptoms for ER: if blood pressure  over 180 AND new headache, shortness of breath, confusion, or chest discomfort See AFTER VISIT SUMMARY for addition educational information provided Offered to refill meds.       Relevant Medications   losartan (COZAAR) 100 MG tablet   Hypokalemia   Neuropathic pain    Worsening of  a chronic problem. Reassured her that it is not dangerous. I advised that she increase her gabapentin it even though she rather avoid that because of the drowsiness am sending in a higher dose so that she at least has it available for use if the pain gets too much.  I okayed her to return to work.  I offered to send her to physical therapy also for the hand pain that can sometimes help prevent the muscles from getting too spasmy and also offered to send in some lidocaine ointment that you can rub over the the painful area.  she declined a physical therapy because she is already been doing aggressive physical therapy but did agree to the gabapentin changes and the lidocaine changes which I sent in      Relevant Medications   gabapentin (NEURONTIN) 100 MG capsule   lidocaine (XYLOCAINE) 5 % ointment   DULoxetine (CYMBALTA) 30 MG capsule        Subjective:   Anita SCARFF is a 77 y.o. female with the following chart data reviewed: Past Medical History:  Diagnosis  Date   Acute CVA (cerebrovascular accident) ([facility]) 01/27/2022   HTN (hypertension) 01/27/2022   Neuropathic pain 04/03/2022   She is a lot more pain in her right hand that is nerve pain so I suspect she is recovering some from her stroke but I advised that she increase her gabapentin it even though she rather avoid that because of the drowsiness am sending in a higher dose so that she at least has it available for use if the pain gets too much.  I okayed her to return to work.  I offered to send her to physical therapy a   PAF (paroxysmal atrial fibrillation) (HCC)    TIA (transient ischemic attack)    Outpatient Medications Prior to Visit   Medication Sig   amLODipine (NORVASC) 5 MG tablet Take 1 tablet (5 mg total) by mouth daily.   apixaban (ELIQUIS) 5 MG TABS tablet Take 1 tablet (5 mg total) by mouth 2 (two) times daily.   atorvastatin (LIPITOR) 40 MG tablet Take 1 tablet (40 mg total) by mouth daily.   cephALEXin (KEFLEX) 500 MG capsule Take 1 capsule (500 mg total) by mouth 4 (four) times daily.   diclofenac Sodium (VOLTAREN) 1 % GEL Apply 2 g topically 4 (four) times daily.   fluticasone (FLONASE) 50 MCG/ACT nasal spray Use 1 spray in each nostril once daily.   loratadine (CLARITIN) 10 MG tablet Take 1 tablet (10 mg total) by mouth daily.   melatonin 5 MG TABS Take 1 tablet (5 mg total) by mouth at bedtime.   potassium chloride (KLOR-CON M) 10 MEQ tablet Take 1 tablet (10 mEq total) by mouth daily.   senna-docusate (SENOKOT-S) 8.6-50 MG tablet Take 1 tablet by mouth 2 (two) times daily.   ticagrelor (BRILINTA) 90 MG TABS tablet Take 1 tablet (90 mg total) by mouth 2 (two) times daily.   [DISCONTINUED] gabapentin (NEURONTIN) 100 MG capsule Take 1 capsule (100 mg total) by mouth at bedtime.   [DISCONTINUED] losartan (COZAAR) 50 MG tablet Take 1 tablet (50 mg total) by mouth daily.   No facility-administered medications prior to visit.     She presented today reporting reason for visit as: Chief Complaint  Patient presents with   1 month follow-up    Went to ED on 12/12 for Afib.    Says the heart rate normalized when she got there so nothing was necessary. Advised she has to go anytime heart rate gets out of control.  PROBLEM FOCUSED CHARTING  Synthesizing chart data with patient interview responses today, the problem list overviews sections were updated to help maintain accessible and detailed records as follows: Problem  Neuropathic Pain   She is a lot more pain in her right hand that is nerve pain so I suspect she is recovering some from her stroke but    Hypokalemia   Reviewed her chronically low K and she  reports she is taking supplement  Will skip on repeat of that today, but she will return soon due to the elevated blood pressure    History of Cva With Residual Deficit   Historical review kept here for reference details:   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  She was found to have elevated blood pressure and CT head done showing patchy hypodensity lentiform nuclei bilaterally.  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  MRI was not done due to history of claustrophobia.  She was transferred from [facility] and exam revealed right hemiataxia with right hypoesthesia and jacksonian march type dysesthesias of RLE followed by RUE and face.  She required sedation prior to x-ray and MRI brain done revealing severe atherosclerotic disease with severe stenosis proximal BA.     She underwent cerebral angiogram revealing 85% stenosis proximal to be an ulcerated plaque in left carotid bulb.  She underwent angioplasty with stenting of VA with complete resolution of stenosis on 11/03.  Postprocedure was found to have left facial droop with bidirectional horizontal nystagmus, decrease in left eye closure and right upper/right lower extremity drift concerning for brainstem infarct.  CT of head done revealing interval development of small acute left cerebellar infarcts and no bleed.  She was started on IV heparin as well as Cleviprex to control and Eliquis resumed 11/05.  Aspirin was DC'd and she continues on Brilinta.  She continued to be limited by nystagmus, dysmetria with hemiplegia, sensory deficits on the right as well as ataxia.  CIR was recommended due to functional decline.  Spent a few days in ICU after the periprocedural CVA.    She was admitted to  rehab 02/04/2022 and continued on Eliquis and Brilinta during her stay.  She did report right knee pain with activity.  Voltaren gel and knee sleeve were added for local measures.  Gabapentin has also been added for neuropathic pain right hand.  Dr. Sima Matas neuropsychologist has worked with patient on coping and adjustment issues but she reported a lot of stress prior to and after CVA with overall stable mood  Since leaving the hospital she has taken potassium for a low discharge k level,  Melatonin was for sleep-wake disruption which has worked well in combination with gabapentin..  Constipation has resolved with use of senna twice daily, but she has continued to require it.  .  She has continued to make great progress with HHPT and HHOT after discharge.  patient reports  improvement in functional use RUE  and RLE as well as improvement in awareness.  She is able to complete ADL tasks at modified independent level, denying any falls since coming home.  She is tolerating regular diet without any signs or symptoms of aspiration and speech therapy signed off 11/10 and patient reports she hasn't choked on food at all..  She is modified independent for transfers and to ambulate household distances with use of rollator. Supervision is recommended when in community setting.  Family can provide assistance necessary after discharge.   Still not driving or strenuous activity till cleared by MD , but hasn't had any seizure active   Hypertension  Left Thalamic Infarction (Hcc) (Resolved)  Routine Health Maintenance (Resolved)            Objective:  Physical Exam  BP (!) 181/94 (BP Location: Left Arm, Patient Position: Sitting)   Pulse 64   Temp 98.1 F (36.7 C) (Temporal)   Ht 5' 3.5" (1.613 m)   Wt 157 lb (71.2 kg)   SpO2 98%   BMI 27.38 kg/m  Wt Readings from Last 10 Encounters:  04/03/22 157 lb (71.2 kg)  03/11/22 156 lb (70.8 kg)  02/28/22 156 lb 3.2 oz (70.9 kg)  02/26/22 156 lb 6.4 oz  (70.9 kg)  02/04/22 158 lb 1.1 oz (71.7 kg)  01/30/22 165 lb (74.8 kg)  01/16/22 167 lb 3.2  oz (75.8 kg)  12/17/21 162 lb (73.5 kg)  09/23/21 162 lb 12.8 oz (73.8 kg)  08/24/21 165 lb (74.8 kg)   Today's vital signs reviewed.  Nursing notes reviewed. Weight trend reviewed. General Appearance/Constitutional:   Overweight female according to BMI standardized data, but waist circumference data should be used instead because BMI-based assessments do not account for lean muscle mass or the fact that only adipose tissue around the waist is unhealthy.  She is in no acute distress. Musculoskeletal: All extremities are intact.  Neurological:  Awake, alert,  No obvious focal neurological deficits or cognitive impairments Psychiatric:  Appropriate mood, pleasant demeanor Problem-specific findings:  seems to have significant pain in right hand but not really tenderness   Results Reviewed:  No results found for any visits on 04/03/22.   Recent Results (from the past 2160 hour(s))  Comprehensive metabolic panel     Status: Abnormal   Collection Time: 01/27/22  1:52 PM  Result Value Ref Range   Sodium 141 135 - 145 mmol/L   Potassium 3.6 3.5 - 5.1 mmol/L   Chloride 107 98 - 111 mmol/L   CO2 26 22 - 32 mmol/L   Glucose, Bld 82 70 - 99 mg/dL    Comment: Glucose reference range applies only to samples taken after fasting for at least 8 hours.   BUN 11 8 - 23 mg/dL   Creatinine, Ser 0.76 0.44 - 1.00 mg/dL   Calcium 9.0 8.9 - 10.3 mg/dL   Total Protein 6.8 6.5 - 8.1 g/dL   Albumin 3.8 3.5 - 5.0 g/dL   AST 14 (L) 15 - 41 U/L   ALT 15 0 - 44 U/L   Alkaline Phosphatase 56 38 - 126 U/L   Total Bilirubin 0.5 0.3 - 1.2 mg/dL   GFR, Estimated >60 >60 mL/min    Comment: (NOTE) Calculated using the CKD-EPI Creatinine Equation (2021)    Anion gap 8 5 - 15    Comment: Performed at [facility], [address], [place], [place] [address]  CBC with Differential     Status: None   Collection Time:  01/27/22  1:52 PM  Result Value Ref Range   WBC 7.1 4.0 - 10.5 K/uL   RBC 4.79 3.87 - 5.11 MIL/uL   Hemoglobin 14.9 12.0 - 15.0 g/dL   HCT 44.4 36.0 - 46.0 %   MCV 92.7 80.0 - 100.0 fL   MCH 31.1 26.0 - 34.0 pg   MCHC 33.6 30.0 - 36.0 g/dL   RDW 12.8 11.5 - 15.5 %   Platelets 253 150 - 400 K/uL   nRBC 0.0 0.0 - 0.2 %   Neutrophils Relative % 62 %   Neutro Abs 4.4 1.7 - 7.7 K/uL   Lymphocytes Relative 26 %   Lymphs Abs 1.8 0.7 - 4.0 K/uL   Monocytes Relative 9 %   Monocytes Absolute 0.6 0.1 - 1.0 K/uL   Eosinophils Relative 2 %   Eosinophils Absolute 0.2 0.0 - 0.5 K/uL   Basophils Relative 1 %   Basophils Absolute 0.0 0.0 - 0.1 K/uL   Immature Granulocytes 0 %   Abs Immature Granulocytes 0.02 0.00 - 0.07 K/uL    Comment: Performed at [facility], [address] [place], [place] [address]  POC CBG, ED     Status: None   Collection Time: 01/27/22  1:54 PM  Result Value Ref Range   Glucose-Capillary 77 70 - 99 mg/dL    Comment: Glucose reference range applies only  to samples taken after fasting for at least 8 hours.  Lipid panel     Status: Abnormal   Collection Time: 01/28/22  3:19 AM  Result Value Ref Range   Cholesterol 226 (H) 0 - 200 mg/dL   Triglycerides 253 (H) <150 mg/dL   HDL 35 (L) >40 mg/dL   Total CHOL/HDL Ratio 6.5 RATIO   VLDL 51 (H) 0 - 40 mg/dL   LDL Cholesterol 140 (H) 0 - 99 mg/dL    Comment:        Total Cholesterol/HDL:CHD Risk Coronary Heart Disease Risk Table                     Men   Women  1/2 Average Risk   3.4   3.3  Average Risk       5.0   4.4  2 X Average Risk   9.6   7.1  3 X Average Risk  23.4   11.0        Use the calculated Patient Ratio above and the CHD Risk Table to determine the patient's CHD Risk.        ATP III CLASSIFICATION (LDL):  <100     mg/dL   Optimal  100-129  mg/dL   Near or Above                    Optimal  130-159  mg/dL   Borderline  160-189  mg/dL   High  >190     mg/dL   Very High Performed at [facility] [place], [place] [address]   Hemoglobin A1c     Status: None   Collection Time: 01/28/22  3:19 AM  Result Value Ref Range   Hgb A1c MFr Bld 5.5 4.8 - 5.6 %    Comment: (NOTE) Pre diabetes:          5.7%-6.4%  Diabetes:              >6.4%  Glycemic control for   <7.0% adults with diabetes    Mean Plasma Glucose 111.15 mg/dL    Comment: Performed at [facility] [address] [place], [place] [address]  ECHOCARDIOGRAM LIMITED     Status: None   Collection Time: 01/28/22 12:15 PM  Result Value Ref Range   Weight 2,688 oz   Height 63.5 in   BP 195/124 mmHg   S' Lateral 2.50 cm  Protime-INR     Status: None   Collection Time: 01/30/22  3:54 AM  Result Value Ref Range   Prothrombin Time 13.7 11.4 - 15.2 seconds   INR 1.1 0.8 - 1.2    Comment: (NOTE) INR goal varies based on device and disease states. Performed at [facility], [place] [address], [place] [address]   CBC     Status: None   Collection Time: 01/31/22  3:20 AM  Result Value Ref Range   WBC 7.9 4.0 - 10.5 K/uL   RBC 4.43 3.87 - 5.11 MIL/uL   Hemoglobin 14.0 12.0 - 15.0 g/dL   HCT 40.8 36.0 - 46.0 %   MCV 92.1 80.0 - 100.0 fL   MCH 31.6 26.0 - 34.0 pg   MCHC 34.3 30.0 - 36.0 g/dL   RDW 12.9 11.5 - 15.5 %   Platelets 214 150 - 400 K/uL   nRBC 0.0 0.0 - 0.2 %    Comment: Performed at [facility], [address] [place], [place] [id number]  Basic metabolic panel     Status: Abnormal   Collection Time: 01/31/22  3:20 AM  Result Value Ref Range   Sodium 141 135 - 145 mmol/L   Potassium 3.0 (L) 3.5 - 5.1 mmol/L   Chloride 112 (H) 98 - 111 mmol/L   CO2 22 22 - 32 mmol/L   Glucose, Bld 107 (H) 70 - 99 mg/dL    Comment: Glucose reference range applies only to samples taken after fasting for at least 8 hours.   BUN 12 8 - 23 mg/dL   Creatinine, Ser 0.79 0.44 - 1.00 mg/dL   Calcium 8.3 (L) 8.9 - 10.3 mg/dL   GFR, Estimated >60 >60 mL/min    Comment: (NOTE) Calculated  using the CKD-EPI Creatinine Equation (2021)    Anion gap 7 5 - 15    Comment: Performed at [facility] [address] [place], [place] [address]  POCT Activated clotting time     Status: None   Collection Time: 01/31/22 11:43 AM  Result Value Ref Range   Activated Clotting Time 137 seconds    Comment: Reference range 74-137 seconds for patients not on anticoagulant therapy.  POCT Activated clotting time     Status: None   Collection Time: 01/31/22 12:04 PM  Result Value Ref Range   Activated Clotting Time 191 seconds    Comment: Reference range 74-137 seconds for patients not on anticoagulant therapy.  POCT Activated clotting time     Status: None   Collection Time: 01/31/22 12:22 PM  Result Value Ref Range   Activated Clotting Time 215 seconds    Comment: Reference range 74-137 seconds for patients not on anticoagulant therapy.  I-STAT 7, (LYTES, BLD GAS, ICA, H+H)     Status: Abnormal   Collection Time: 01/31/22  1:31 PM  Result Value Ref Range   pH, Arterial 7.277 (L) 7.35 - 7.45   pCO2 arterial 54.8 (H) 32 - 48 mmHg   pO2, Arterial 97 83 - 108 mmHg   Bicarbonate 25.5 20.0 - 28.0 mmol/L   TCO2 27 22 - 32 mmol/L   O2 Saturation 96 %   Acid-base deficit 2.0 0.0 - 2.0 mmol/L   Sodium 143 135 - 145 mmol/L   Potassium 3.5 3.5 - 5.1 mmol/L   Calcium, Ion 1.22 1.15 - 1.40 mmol/L   HCT 40.0 36.0 - 46.0 %   Hemoglobin 13.6 12.0 - 15.0 g/dL   Sample type ARTERIAL   I-STAT 7, (LYTES, BLD GAS, ICA, H+H)     Status: Abnormal   Collection Time: 01/31/22  3:49 PM  Result Value Ref Range   pH, Arterial 7.368 7.35 - 7.45   pCO2 arterial 37.8 32 - 48 mmHg   pO2, Arterial 187 (H) 83 - 108 mmHg   Bicarbonate 21.9 20.0 - 28.0 mmol/L   TCO2 23 22 - 32 mmol/L   O2 Saturation 100 %   Acid-base deficit 3.0 (H) 0.0 - 2.0 mmol/L   Sodium 142 135 - 145 mmol/L   Potassium 3.5 3.5 - 5.1 mmol/L   Calcium, Ion 1.17 1.15 - 1.40 mmol/L   HCT 40.0 36.0 - 46.0 %   Hemoglobin 13.6 12.0 -  15.0 g/dL   Patient temperature 97.9 F    Sample type ARTERIAL   Glucose, capillary     Status: Abnormal   Collection Time: 01/31/22  6:27 PM  Result Value Ref Range   Glucose-Capillary 136 (H) 70 - 99  mg/dL    Comment: Glucose reference range applies only to samples taken after fasting for at least 8 hours.  Heparin level (unfractionated)     Status: Abnormal   Collection Time: 02/01/22 12:11 AM  Result Value Ref Range   Heparin Unfractionated 0.12 (L) 0.30 - 0.70 IU/mL    Comment: (NOTE) The clinical reportable range upper limit is being lowered to >1.10 to align with the FDA approved guidance for the current laboratory assay.  If heparin results are below expected values, and patient dosage has  been confirmed, suggest follow up testing of antithrombin III levels. Performed at [facility], [place] [address] [place], [place] [address]   CBC     Status: None   Collection Time: 02/01/22 12:11 AM  Result Value Ref Range   WBC 6.4 4.0 - 10.5 K/uL   RBC 4.27 3.87 - 5.11 MIL/uL   Hemoglobin 13.1 12.0 - 15.0 g/dL   HCT 39.4 36.0 - 46.0 %   MCV 92.3 80.0 - 100.0 fL   MCH 30.7 26.0 - 34.0 pg   MCHC 33.2 30.0 - 36.0 g/dL   RDW 13.0 11.5 - 15.5 %   Platelets 205 150 - 400 K/uL   nRBC 0.0 0.0 - 0.2 %    Comment: Performed at [facility], [place] [address], [place], [place] [address]  Basic metabolic panel     Status: Abnormal   Collection Time: 02/01/22 12:11 AM  Result Value Ref Range   Sodium 141 135 - 145 mmol/L   Potassium 3.4 (L) 3.5 - 5.1 mmol/L   Chloride 110 98 - 111 mmol/L   CO2 21 (L) 22 - 32 mmol/L   Glucose, Bld 118 (H) 70 - 99 mg/dL    Comment: Glucose reference range applies only to samples taken after fasting for at least 8 hours.   BUN 10 8 - 23 mg/dL   Creatinine, Ser 0.64 0.44 - 1.00 mg/dL   Calcium 8.7 (L) 8.9 - 10.3 mg/dL   GFR, Estimated >60 >60 mL/min    Comment: (NOTE) Calculated using the CKD-EPI Creatinine Equation (2021)    Anion gap 10 5 -  15    Comment: Performed at [facility] [address], [place], [place] [address]  APTT     Status: None   Collection Time: 02/01/22 12:11 AM  Result Value Ref Range   aPTT 32 24 - 36 seconds    Comment: Performed at [facility] [address], [place], [place] [address]  Heparin level (unfractionated)     Status: Abnormal   Collection Time: 02/01/22 10:10 AM  Result Value Ref Range   Heparin Unfractionated 0.11 (L) 0.30 - 0.70 IU/mL    Comment: (NOTE) The clinical reportable range upper limit is being lowered to >1.10 to align with the FDA approved guidance for the current laboratory assay.  If heparin results are below expected values, and patient dosage has  been confirmed, suggest follow up testing of antithrombin III levels. Performed at [facility], [place] [address], [place], [place] [address]   Basic metabolic panel     Status: Abnormal   Collection Time: 02/03/22  5:09 AM  Result Value Ref Range   Sodium 141 135 - 145 mmol/L   Potassium 3.3 (L) 3.5 - 5.1 mmol/L   Chloride 109 98 - 111 mmol/L   CO2 23 22 - 32 mmol/L   Glucose, Bld 96 70 - 99 mg/dL  Comment: Glucose reference range applies only to samples taken after fasting for at least 8 hours.   BUN 14 8 - 23 mg/dL   Creatinine, Ser 0.79 0.44 - 1.00 mg/dL   Calcium 8.7 (L) 8.9 - 10.3 mg/dL   GFR, Estimated >60 >60 mL/min    Comment: (NOTE) Calculated using the CKD-EPI Creatinine Equation (2021)    Anion gap 9 5 - 15    Comment: Performed at [facility] [address], [place] [address]  CBC     Status: None   Collection Time: 02/03/22  5:09 AM  Result Value Ref Range   WBC 7.6 4.0 - 10.5 K/uL   RBC 4.44 3.87 - 5.11 MIL/uL   Hemoglobin 13.9 12.0 - 15.0 g/dL   HCT 40.9 36.0 - 46.0 %   MCV 92.1 80.0 - 100.0 fL   MCH 31.3 26.0 - 34.0 pg   MCHC 34.0 30.0 - 36.0 g/dL   RDW 12.9 11.5 - 15.5 %   Platelets 236 150 - 400 K/uL   nRBC 0.0 0.0 - 0.2 %    Comment: Performed at  [facility], [place] [address] [place], [place] [address]  Comprehensive metabolic panel     Status: Abnormal   Collection Time: 02/05/22  5:54 AM  Result Value Ref Range   Sodium 142 135 - 145 mmol/L   Potassium 3.6 3.5 - 5.1 mmol/L   Chloride 111 98 - 111 mmol/L   CO2 23 22 - 32 mmol/L   Glucose, Bld 104 (H) 70 - 99 mg/dL    Comment: Glucose reference range applies only to samples taken after fasting for at least 8 hours.   BUN 16 8 - 23 mg/dL   Creatinine, Ser 0.81 0.44 - 1.00 mg/dL   Calcium 8.9 8.9 - 10.3 mg/dL   Total Protein 6.0 (L) 6.5 - 8.1 g/dL   Albumin 3.3 (L) 3.5 - 5.0 g/dL   AST 31 15 - 41 U/L   ALT 45 (H) 0 - 44 U/L   Alkaline Phosphatase 51 38 - 126 U/L   Total Bilirubin 0.9 0.3 - 1.2 mg/dL   GFR, Estimated >60 >60 mL/min    Comment: (NOTE) Calculated using the CKD-EPI Creatinine Equation (2021)    Anion gap 8 5 - 15    Comment: Performed at [facility], [place] [address] [place], [place] [address]  CBC with Differential/Platelet     Status: None   Collection Time: 02/05/22  5:54 AM  Result Value Ref Range   WBC 6.8 4.0 - 10.5 K/uL   RBC 4.62 3.87 - 5.11 MIL/uL   Hemoglobin 14.2 12.0 - 15.0 g/dL   HCT 42.4 36.0 - 46.0 %   MCV 91.8 80.0 - 100.0 fL   MCH 30.7 26.0 - 34.0 pg   MCHC 33.5 30.0 - 36.0 g/dL   RDW 12.9 11.5 - 15.5 %   Platelets 256 150 - 400 K/uL   nRBC 0.0 0.0 - 0.2 %   Neutrophils Relative % 72 %   Neutro Abs 4.9 1.7 - 7.7 K/uL   Lymphocytes Relative 17 %   Lymphs Abs 1.1 0.7 - 4.0 K/uL   Monocytes Relative 8 %   Monocytes Absolute 0.5 0.1 - 1.0 K/uL   Eosinophils Relative 3 %   Eosinophils Absolute 0.2 0.0 - 0.5 K/uL   Basophils Relative 0 %   Basophils Absolute 0.0 0.0 - 0.1 K/uL   Immature Granulocytes 0 %  Abs Immature Granulocytes 0.03 0.00 - 0.07 K/uL    Comment: Performed at [facility], [place] [address], [place], [place] [id number]  Basic metabolic panel     Status: Abnormal   Collection Time: 02/10/22  5:10 AM   Result Value Ref Range   Sodium 140 135 - 145 mmol/L   Potassium 3.4 (L) 3.5 - 5.1 mmol/L   Chloride 110 98 - 111 mmol/L   CO2 20 (L) 22 - 32 mmol/L   Glucose, Bld 97 70 - 99 mg/dL    Comment: Glucose reference range applies only to samples taken after fasting for at least 8 hours.   BUN 16 8 - 23 mg/dL   Creatinine, Ser 0.64 0.44 - 1.00 mg/dL   Calcium 8.6 (L) 8.9 - 10.3 mg/dL   GFR, Estimated >60 >60 mL/min    Comment: (NOTE) Calculated using the CKD-EPI Creatinine Equation (2021)    Anion gap 10 5 - 15    Comment: Performed at [facility] [address] [place] [address]  CBC     Status: None   Collection Time: 02/10/22  5:10 AM  Result Value Ref Range   WBC 7.8 4.0 - 10.5 K/uL   RBC 4.27 3.87 - 5.11 MIL/uL   Hemoglobin 13.5 12.0 - 15.0 g/dL   HCT 39.7 36.0 - 46.0 %   MCV 93.0 80.0 - 100.0 fL   MCH 31.6 26.0 - 34.0 pg   MCHC 34.0 30.0 - 36.0 g/dL   RDW 13.0 11.5 - 15.5 %   Platelets 268 150 - 400 K/uL   nRBC 0.0 0.0 - 0.2 %    Comment: Performed at [facility], [place] [address] [place], [employer] [id number]  Basic metabolic panel     Status: None   Collection Time: 02/28/22  3:33 PM  Result Value Ref Range   Glucose, Bld 93 65 - 99 mg/dL    Comment: .            Fasting reference interval .    BUN 17 7 - 25 mg/dL   Creat 0.75 0.60 - 1.00 mg/dL   BUN/Creatinine Ratio SEE NOTE: 6 - 22 (calc)    Comment:    Not Reported: BUN and Creatinine are within    reference range. .    Sodium 143 135 - 146 mmol/L   Potassium 3.5 3.5 - 5.3 mmol/L   Chloride 109 98 - 110 mmol/L   CO2 21 20 - 32 mmol/L   Calcium 8.8 8.6 - 10.4 mg/dL  CBC with Differential     Status: None   Collection Time: 03/11/22 10:18 AM  Result Value Ref Range   WBC 7.5 4.0 - 10.5 K/uL   RBC 4.27 3.87 - 5.11 MIL/uL   Hemoglobin 13.3 12.0 - 15.0 g/dL   HCT 40.3 36.0 - 46.0 %   MCV 94.4 80.0 - 100.0 fL   MCH 31.1 26.0 - 34.0 pg   MCHC 33.0 30.0 - 36.0 g/dL   RDW 13.2 11.5 - 15.5  %   Platelets 264 150 - 400 K/uL   nRBC 0.0 0.0 - 0.2 %   Neutrophils Relative % 76 %   Neutro Abs 5.7 1.7 - 7.7 K/uL   Lymphocytes Relative 15 %   Lymphs Abs 1.1 0.7 - 4.0 K/uL   Monocytes Relative 7 %   Monocytes Absolute 0.5 0.1 - 1.0 K/uL   Eosinophils Relative 2 %   Eosinophils Absolute 0.1  0.0 - 0.5 K/uL   Basophils Relative 0 %   Basophils Absolute 0.0 0.0 - 0.1 K/uL   Immature Granulocytes 0 %   Abs Immature Granulocytes 0.03 0.00 - 0.07 K/uL    Comment: Performed at [facility], [address], [place], [place] [address]  Comprehensive metabolic panel     Status: Abnormal   Collection Time: 03/11/22 10:18 AM  Result Value Ref Range   Sodium 144 135 - 145 mmol/L   Potassium 3.4 (L) 3.5 - 5.1 mmol/L   Chloride 113 (H) 98 - 111 mmol/L   CO2 22 22 - 32 mmol/L   Glucose, Bld 94 70 - 99 mg/dL    Comment: Glucose reference range applies only to samples taken after fasting for at least 8 hours.   BUN 10 8 - 23 mg/dL   Creatinine, Ser 0.76 0.44 - 1.00 mg/dL   Calcium 8.8 (L) 8.9 - 10.3 mg/dL   Total Protein 6.2 (L) 6.5 - 8.1 g/dL   Albumin 3.5 3.5 - 5.0 g/dL   AST 20 15 - 41 U/L   ALT 21 0 - 44 U/L   Alkaline Phosphatase 64 38 - 126 U/L   Total Bilirubin 0.7 0.3 - 1.2 mg/dL   GFR, Estimated >60 >60 mL/min    Comment: (NOTE) Calculated using the CKD-EPI Creatinine Equation (2021)    Anion gap 9 5 - 15    Comment: Performed at [facility], [address] [place], [place] [address]  Urinalysis, Routine w reflex microscopic Urine, Clean Catch     Status: Abnormal   Collection Time: 03/11/22 11:42 AM  Result Value Ref Range   Color, Urine AMBER (A) YELLOW    Comment: BIOCHEMICALS MAY BE AFFECTED BY COLOR   APPearance CLOUDY (A) CLEAR   Specific Gravity, Urine 1.021 1.005 - 1.030   pH 5.0 5.0 - 8.0   Glucose, UA NEGATIVE NEGATIVE mg/dL   Hgb urine dipstick SMALL (A) NEGATIVE   Bilirubin Urine NEGATIVE NEGATIVE   Ketones, ur NEGATIVE NEGATIVE mg/dL   Protein, ur 30 (A)  NEGATIVE mg/dL   Nitrite POSITIVE (A) NEGATIVE   Leukocytes,Ua MODERATE (A) NEGATIVE   RBC / HPF 0-5 0 - 5 RBC/hpf   WBC, UA 21-50 0 - 5 WBC/hpf   Bacteria, UA RARE (A) NONE SEEN   Squamous Epithelial / HPF 0-5 0 - 5   Mucus PRESENT    Hyaline Casts, UA PRESENT    Uric Acid Crys, UA PRESENT     Comment: Performed at [facility], [address], [place], [place] [address]  Urine Culture     Status: Abnormal   Collection Time: 03/11/22 12:24 PM   Specimen: Urine, Clean Catch  Result Value Ref Range   Specimen Description      URINE, CLEAN CATCH Performed at [facility], [address], [place], [place] [address]    Special Requests      NONE Performed at [facility], [address] [place], [place] [address]    Culture >=100,000 COLONIES/mL ESCHERICHIA COLI (A)    Report Status 03/13/2022 FINAL    Organism ID, Bacteria ESCHERICHIA COLI (A)       Susceptibility   Escherichia coli - MIC*    AMPICILLIN 8 SENSITIVE Sensitive     CEFAZOLIN <=4 SENSITIVE Sensitive     CEFEPIME <=0.12 SENSITIVE Sensitive     CEFTRIAXONE <=0.25 SENSITIVE Sensitive     CIPROFLOXACIN <=0.25 SENSITIVE Sensitive     GENTAMICIN <=1 SENSITIVE Sensitive  IMIPENEM 0.5 SENSITIVE Sensitive     NITROFURANTOIN <=16 SENSITIVE Sensitive     TRIMETH/SULFA <=20 SENSITIVE Sensitive     AMPICILLIN/SULBACTAM 4 SENSITIVE Sensitive     PIP/TAZO <=4 SENSITIVE Sensitive     * >=100,000 COLONIES/mL ESCHERICHIA COLI      Loralee Pacas, MD

## 2022-04-03 NOTE — Assessment & Plan Note (Addendum)
Worsening of  a chronic problem. Reassured her that it is not dangerous. I advised that she increase her gabapentin it even though she rather avoid that because of the drowsiness am sending in a higher dose so that she at least has it available for use if the pain gets too much.  I okayed her to return to work.  I offered to send her to physical therapy also for the hand pain that can sometimes help prevent the muscles from getting too spasmy and also offered to send in some lidocaine ointment that you can rub over the the painful area.  she declined a physical therapy because she is already been doing aggressive physical therapy but did agree to the gabapentin changes and the lidocaine changes which I sent in

## 2022-04-03 NOTE — Patient Instructions (Signed)
It was a pleasure seeing you today!  Your health and satisfaction are my top priorities. If you believe your experience today was worthy of a 5-star rating, I'd be grateful for your feedback! Loralee Pacas, MD   CHECKOUT CHECKLIST  []    Schedule next appointment(s):    Return in about 2 weeks (around 04/17/2022) for review problems and medications especially bp .  Any requested lab visits should be scheduled as appointments too  If you are not doing well:  Return to the office sooner Please bring all your medicine bottles to each appointment If your condition begins to worsen or become severe:  go to the emergency room or even call 911    []   (Optional):  Review your clinical notes on MyChart after they are completed.     Today's draft of the physician documented plan for today's visit: (final revisions will be visible on MyChart chart later) Hypertension, unspecified type Assessment & Plan: Individualized Hypertension Management: Stable due to no chest pain shortness of breath or headache , but severely controlled at  BP Readings from Last 1 Encounters:  04/03/22 (!) 181/94   Patient reports she didn't forget medications and is not stressed.  Home checks also uncontrolled  Goal blood pressure of less than 140/90 explained Resistant/secondary hypertension workup: not necessary in my opinion due to only at half dose on 2 pills Current hypertension medications:       Sig   amLODipine (NORVASC) 5 MG tablet (Taking) Take 1 tablet (5 mg total) by mouth daily.   losartan (COZAAR) 50 MG tablet (Taking) Take 1 tablet (50 mg total) by mouth daily.       Adjust medications as follows:  Losartan 50->100 mg daily and check blood pressure 2x daily  Standardized hypertension counseling and management: Counseled her to limit: salt, alcohol, NSAIDS, excess body weight. Have explained risks of poor control are FUTURE stroke and heart attacks Encouragement for home blood pressure  monitoring Explained Red Flag symptoms for ER: if blood pressure over 180 AND new headache, shortness of breath, confusion, or chest discomfort See AFTER VISIT SUMMARY for addition educational information provided Offered to refill meds.    Neuropathic pain Assessment & Plan: I advised that she increase her gabapentin it even though she rather avoid that because of the drowsiness am sending in a higher dose so that she at least has it available for use if the pain gets too much.  I okayed her to return to work.  I offered to send her to physical therapy also for the hand pain that can sometimes help prevent the muscles from getting too spasmy and also offered to send in some lidocaine ointment that you can rub over the the painful area.  He declined a physical because she is already been doing aggressive physical therapy but did agree to the gabapentin changes and the lidocaine changes which I sent in       [place]: CLINICAL: please contact us via phone [phone] OR MyChart messaging  LAB & IMAGING:   We will call you if the results are significantly abnormal or you don't use MyChart.  Most normal results will be posted to MyChart immediately and have a clinical review message by Dr. Randol Kern posted within 2-3 business days.   If you have not heard from [place] regarding the results in 2 weeks OR if you need priority reporting, please contact this office. MYCHART:  The fastest way to get your results and  easiest way to stay in touch with [place] is by activating your My Chart account. Instructions are located on the last page of this paperwork.  BILLING: xray and lab orders are billed from separate companies and questions./concerns should be directed to the [place].  For visit charges please discuss with our administrative services COMPLAINTS:  please let Dr. Randol Kern know or see the [place], by asking at the front desk: we  want you to be satisfied with every experience and we would be grateful for the opportunity to address any problems

## 2022-04-03 NOTE — Assessment & Plan Note (Addendum)
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 possibly due to makes some recovery in terms of her ability to feel pain there.

## 2022-04-03 NOTE — Assessment & Plan Note (Addendum)
My primary concern today  Individualized Hypertension Management: Stable due to no chest pain shortness of breath or headache , but severely controlled at  BP Readings from Last 1 Encounters:  04/03/22 (!) 181/94   Patient reports she didn't forget medications and is not stressed.  Home checks also uncontrolled  Goal blood pressure of less than 140/90 explained Resistant/secondary hypertension workup: not necessary in my opinion due to only at half dose on 2 pills Current hypertension medications:       Sig   amLODipine (NORVASC) 5 MG tablet (Taking) Take 1 tablet (5 mg total) by mouth daily.   losartan (COZAAR) 50 MG tablet (Taking) Take 1 tablet (50 mg total) by mouth daily.       Adjust medications as follows:  Losartan 50->100 mg daily and check blood pressure 2x daily  Standardized hypertension counseling and management: Counseled her to limit: salt, alcohol, NSAIDS, excess body weight. Have explained risks of poor control are FUTURE stroke and heart attacks Encouragement for home blood pressure monitoring Explained Red Flag symptoms for ER: if blood pressure over 180 AND new headache, shortness of breath, confusion, or chest discomfort See AFTER VISIT SUMMARY for addition educational information provided Offered to refill meds.

## 2022-04-03 NOTE — Telephone Encounter (Signed)
Patient following on [employer]'s fax request sent to our office for swap of medication to save costs.  [employer] [phone]

## 2022-04-07 ENCOUNTER — Telehealth: Payer: Self-pay | Admitting: Physical Medicine & Rehabilitation

## 2022-04-07 NOTE — Telephone Encounter (Signed)
Patient is calling to get prior auth on generic brand of Brilinta.  Please call patient when this is complete.

## 2022-04-08 ENCOUNTER — Encounter: Payer: Medicare HMO | Admitting: Physical Medicine & Rehabilitation

## 2022-04-08 NOTE — Telephone Encounter (Signed)
Last prescribed by Berniece Pap, MD. Patient PCP

## 2022-04-10 ENCOUNTER — Inpatient Hospital Stay: Payer: Medicare HMO | Admitting: Diagnostic Neuroimaging

## 2022-04-14 ENCOUNTER — Inpatient Hospital Stay: Payer: Medicare HMO | Admitting: Diagnostic Neuroimaging

## 2022-04-16 ENCOUNTER — Ambulatory Visit: Payer: Medicare HMO | Admitting: Diagnostic Neuroimaging

## 2022-04-16 ENCOUNTER — Encounter: Payer: Self-pay | Admitting: Diagnostic Neuroimaging

## 2022-04-16 VITALS — BP 144/80 | HR 66 | Ht 64.0 in | Wt 157.0 lb

## 2022-04-16 DIAGNOSIS — I6381 Other cerebral infarction due to occlusion or stenosis of small artery: Secondary | ICD-10-CM | POA: Diagnosis not present

## 2022-04-16 DIAGNOSIS — I6322 Cerebral infarction due to unspecified occlusion or stenosis of basilar arteries: Secondary | ICD-10-CM | POA: Diagnosis not present

## 2022-04-16 NOTE — Progress Notes (Signed)
[facility]  PATIENT: Anita Fletcher DOB: 1945/10/31  REFERRING CLINICIAN: Loralee Pacas, MD HISTORY FROM: patient  REASON FOR VISIT: new consult    HISTORICAL  CHIEF COMPLAINT:  Chief Complaint  Patient presents with   Cerebrovascular Accident    Rm 6 alone  Pt is well, reports some numbness/pain on R side but overall stable Does mention BP fluctuation, will see PCP tomorrow     HISTORY OF PRESENT ILLNESS:   77 year old female with hypertension, hyperlipidemia, atrial fibrillation, here for stroke follow-up.  Patient presented to hospital on 01/28/2022 for right-sided numbness, right-sided weakness, right Hemi ataxia.  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  Stroke workup was completed.  She also underwent basilar artery stenting.  Since that time she is doing well.  She has some mild residual numbness pain on the right side.  Tolerating medications.   REVIEW OF SYSTEMS: Full 14 system review of systems performed and negative with exception of: as per HPI.  ALLERGIES: Allergies  Allergen Reactions   Codeine     HOME MEDICATIONS: Outpatient Medications Prior to Visit  Medication Sig Dispense Refill   amLODipine (NORVASC) 5 MG tablet Take 1 tablet (5 mg total) by mouth daily. 90 tablet 3   apixaban (ELIQUIS) 5 MG TABS tablet Take 1 tablet (5 mg total) by mouth 2 (two) times daily. 180 tablet 3   atorvastatin (LIPITOR) 40 MG tablet Take 1 tablet (40 mg total) by mouth daily. 90 tablet 3   diclofenac Sodium (VOLTAREN) 1 % GEL Apply 2 g topically 4 (four) times daily. 350 g 0   fluticasone (FLONASE) 50 MCG/ACT nasal spray Use 1 spray in each nostril once daily. 16 g 0   gabapentin (NEURONTIN) 100 MG capsule Take 1 capsule (100 mg total) by mouth 3 (three) times daily. 270 capsule 1   lidocaine (XYLOCAINE) 5 % ointment Apply 1 Application topically as needed. 35.44 g 0    loratadine (CLARITIN) 10 MG tablet Take 1 tablet (10 mg total) by mouth daily. 90 tablet 3   losartan (COZAAR) 100 MG tablet Take 1 tablet (100 mg total) by mouth daily. 90 tablet 3   melatonin 5 MG TABS Take 1 tablet (5 mg total) by mouth at bedtime. 90 tablet 3   potassium chloride (KLOR-CON M) 10 MEQ tablet Take 1 tablet (10 mEq total) by mouth daily. 90 tablet 3   senna-docusate (SENOKOT-S) 8.6-50 MG tablet Take 1 tablet by mouth 2 (two) times daily. 180 tablet 3   ticagrelor (BRILINTA) 90 MG TABS tablet Take 1 tablet (90 mg total) by mouth 2 (two) times daily. 180 tablet 3   cephALEXin (KEFLEX) 500 MG capsule Take 1 capsule (500 mg total) by mouth 4 (four) times daily. 28 capsule 0   DULoxetine (CYMBALTA) 30 MG capsule Take one 30 mg tablet by mouth once a day for the first week. Then increase to two 30 mg tablets ( total 60mg ) by mouth once daily. 60 capsule 3   No facility-administered medications prior to visit.    PAST MEDICAL HISTORY: Past Medical History:  Diagnosis Date   Acute CVA (cerebrovascular accident) ([place]) 01/27/2022   HTN (hypertension) 01/27/2022   Neuropathic pain 04/03/2022   She is a lot more pain in her right hand that is nerve pain so I suspect she is recovering some from her stroke but I advised that she increase her  gabapentin it even though she rather avoid that because of the drowsiness am sending in a higher dose so that she at least has it available for use if the pain gets too much.  I okayed her to return to work.  I offered to send her to physical therapy a   PAF (paroxysmal atrial fibrillation) (HCC)    TIA (transient ischemic attack)     PAST SURGICAL HISTORY: Past Surgical History:  Procedure Laterality Date   EYE SURGERY     IR ANGIO INTRA EXTRACRAN SEL COM CAROTID INNOMINATE BILAT MOD SED  01/30/2022   IR ANGIO VERTEBRAL SEL VERTEBRAL UNI L MOD SED  01/30/2022   IR ANGIO VERTEBRAL SEL VERTEBRAL UNI L MOD SED  01/31/2022   IR ANGIOGRAM FOLLOW UP  STUDY  01/31/2022   IR CT HEAD LTD  01/31/2022   IR INTRA CRAN STENT  01/31/2022   IR US GUIDE VASC ACCESS LEFT  01/31/2022   IR US GUIDE VASC ACCESS RIGHT  01/30/2022   RADIOLOGY WITH ANESTHESIA N/A 01/30/2022   Procedure: MRI WITH ANESTHESIA - BRAIN W/O CONSTRAST;  Surgeon: Radiologist, Medication, MD;  Location: [facility] OR;  Service: Radiology;  Laterality: N/A;   RADIOLOGY WITH ANESTHESIA N/A 01/31/2022   Procedure: RADIOLOGY WITH ANESTHESIA;  Surgeon: Radiologist, Medication, MD;  Location: [facility] OR;  Service: Radiology;  Laterality: N/A;    FAMILY HISTORY: Family History  Problem Relation Age of Onset   Alcoholism Father    Arrhythmia Brother    Diabetes Brother    Mental illness Brother     SOCIAL HISTORY: Social History   Socioeconomic History   Marital status: Single    Spouse name: Not on file   Number of children: 0   Years of education: Not on file   Highest education level: Not on file  Occupational History   Occupation: [employer] building  Tobacco Use   Smoking status: Never   Smokeless tobacco: Never  Vaping Use   Vaping Use: Never used  Substance and Sexual Activity   Alcohol use: No   Drug use: No   Sexual activity: Not on file  Other Topics Concern   Not on file  Social History Narrative   Not on file   Social Determinants of Health   Financial Resource Strain: Not on file  Food Insecurity: No Food Insecurity (03/18/2022)   Hunger Vital Sign    Worried About Running Out of Food in the Last Year: Never true    Ran Out of Food in the Last Year: Never true  Transportation Needs: No Transportation Needs (03/18/2022)   PRAPARE - Administrator, Civil Service (Medical): No    Lack of Transportation (Non-Medical): No  Physical Activity: Not on file  Stress: Not on file  Social Connections: Not on file  Intimate Partner Violence: Not At Risk (01/29/2022)   Humiliation, Afraid, Rape, and Kick questionnaire    Fear of Current or Ex-Partner: No     Emotionally Abused: No    Physically Abused: No    Sexually Abused: No     PHYSICAL EXAM  GENERAL EXAM/CONSTITUTIONAL: Vitals:  Vitals:   04/16/22 1400 04/16/22 1407  BP: (!) 156/92 (!) 144/80  Pulse: 68 66  Weight: 157 lb (71.2 kg)   Height: 5\' 4"  (1.626 m)    Body mass index is 26.95 kg/m. Wt Readings from Last 3 Encounters:  04/17/22 158 lb (71.7 kg)  04/16/22 157 lb (71.2 kg)  04/03/22 157 lb (71.2 kg)  Patient is in no distress; well developed, nourished and groomed; neck is supple  CARDIOVASCULAR: Examination of carotid arteries is normal; no carotid bruits Regular rate and rhythm, no murmurs Examination of peripheral vascular system by observation and palpation is normal  EYES: Ophthalmoscopic exam of optic discs and posterior segments is normal; no papilledema or hemorrhages No results found.  MUSCULOSKELETAL: Gait, strength, tone, movements noted in Neurologic exam below  NEUROLOGIC: MENTAL STATUS:      No data to display         awake, alert, oriented to person, place and time recent and remote memory intact normal attention and concentration language fluent, comprehension intact, naming intact fund of knowledge appropriate  CRANIAL NERVE:  2nd - no papilledema on fundoscopic exam 2nd, 3rd, 4th, 6th - pupils equal and reactive to light, visual fields full to confrontation, extraocular muscles intact, no nystagmus 5th - facial sensation symmetric 7th - facial strength symmetric 8th - hearing intact 9th - palate elevates symmetrically, uvula midline 11th - shoulder shrug symmetric 12th - tongue protrusion midline  MOTOR:  normal bulk and tone, full strength in the BUE, BLE  SENSORY:  normal and symmetric to light touch, temperature, vibration  COORDINATION:  finger-nose-finger, fine finger movements normal  REFLEXES:  deep tendon reflexes 1+ and symmetric  GAIT/STATION:  narrow based gait     DIAGNOSTIC DATA (LABS, IMAGING,  TESTING) - I reviewed patient records, labs, notes, testing and imaging myself where available.  Lab Results  Component Value Date   WBC 7.5 03/11/2022   HGB 13.3 03/11/2022   HCT 40.3 03/11/2022   MCV 94.4 03/11/2022   PLT 264 03/11/2022      Component Value Date/Time   NA 144 03/11/2022 1018   K 3.4 (L) 03/11/2022 1018   CL 113 (H) 03/11/2022 1018   CO2 22 03/11/2022 1018   GLUCOSE 94 03/11/2022 1018   BUN 10 03/11/2022 1018   CREATININE 0.76 03/11/2022 1018   CREATININE 0.75 02/28/2022 1533   CALCIUM 8.8 (L) 03/11/2022 1018   PROT 6.2 (L) 03/11/2022 1018   ALBUMIN 3.5 03/11/2022 1018   AST 20 03/11/2022 1018   ALT 21 03/11/2022 1018   ALKPHOS 64 03/11/2022 1018   BILITOT 0.7 03/11/2022 1018   GFRNONAA >60 03/11/2022 1018   GFRAA >90 07/05/2013 0850   Lab Results  Component Value Date   CHOL 226 (H) 01/28/2022   HDL 35 (L) 01/28/2022   LDLCALC 140 (H) 01/28/2022   TRIG 253 (H) 01/28/2022   CHOLHDL 6.5 01/28/2022   Lab Results  Component Value Date   HGBA1C 5.5 01/28/2022   No results found for: "VITAMINB12" Lab Results  Component Value Date   TSH 1.526 08/24/2021       ASSESSMENT AND PLAN  77 y.o. year old female here with:   Dx:  1. Thalamic stroke (HCC)   2. Basilar artery stenosis with infarction [facility])      PLAN:  77 y.o. female presenting with right sided sensory deficits, right hemiataxia, subtle righ sided weakness and right sided hypoesthesia.    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 Large vessel disease from basilar artery stenosis s/p elective basilar artery angioplasty and stenting for severe stenosis with postprocedure bilateral cerebellar infarcts CT no acute abnormality.   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   MRI showed left lateral thalamic infarct.   Cerebral angiogram today showed extreme tortuosity of major neck  arteries, severe  tortuosity of the proximal basilar artery resulting in 85% stenosis, Ulcerated plaque in the left carotid bulb.  No significant stenosis.  Bilateral fetal PCAs with severe atherosclerotic disease, greater on the left side where there is moderate stenosis at the P2 segment.  EF 70 to 75%.   LDL 140, TG 253 A1c 5.5 VTE prophylaxis - SCDs Eliquis has been on hold since admission - resumed on 02/02/2022 On DAPT with ASA and brilinta initially and then changed to Brilinta and Eliquis from 02/02/2022 Post stroke pain (thalamic pain syndrome) --> continue gabapentin 100mg  at bedtime; may increase as tolerated   Basilar artery stenosis CTA head neck showed proximal basilar artery severe stenosis.   Cerebral angiogram today showed extreme tortuosity of major neck arteries, severe tortuosity of the proximal basilar artery resulting in 85% stenosis. S/p basilar artery stenting (Dr. Debbrah Alar); on brillinta; follow up with Dr. Debbrah Alar in May 2024   Afib Continue apixaban   Hypertension Continue amlodipine and losartan   Hyperlipidemia LDL 140, goal < 70 continue atorvastatin  Return for return to PCP, pending if symptoms worsen or fail to improve.    Penni Bombard, MD 08/06/3265, 1:24 PM Certified in Neurology, Neurophysiology and Neuroimaging  [facility] [address], [place], [place] [address] [phone]

## 2022-04-16 NOTE — Patient Instructions (Signed)
-  basilar artery stenting (Dr. Debbrah Alar); on brillinta; follow up with Dr. Debbrah Alar in May 2024  - continue other medications as currently

## 2022-04-17 ENCOUNTER — Ambulatory Visit (INDEPENDENT_AMBULATORY_CARE_PROVIDER_SITE_OTHER): Payer: Medicare HMO | Admitting: Internal Medicine

## 2022-04-17 ENCOUNTER — Encounter: Payer: Self-pay | Admitting: Internal Medicine

## 2022-04-17 VITALS — BP 124/69 | HR 70 | Temp 98.2°F | Ht 64.0 in | Wt 158.0 lb

## 2022-04-17 DIAGNOSIS — Z9181 History of falling: Secondary | ICD-10-CM

## 2022-04-17 DIAGNOSIS — I6930 Unspecified sequelae of cerebral infarction: Secondary | ICD-10-CM

## 2022-04-17 DIAGNOSIS — I672 Cerebral atherosclerosis: Secondary | ICD-10-CM | POA: Diagnosis not present

## 2022-04-17 DIAGNOSIS — E782 Mixed hyperlipidemia: Secondary | ICD-10-CM | POA: Diagnosis not present

## 2022-04-17 DIAGNOSIS — M792 Neuralgia and neuritis, unspecified: Secondary | ICD-10-CM

## 2022-04-17 DIAGNOSIS — I10 Essential (primary) hypertension: Secondary | ICD-10-CM

## 2022-04-17 DIAGNOSIS — I480 Paroxysmal atrial fibrillation: Secondary | ICD-10-CM

## 2022-04-17 DIAGNOSIS — E876 Hypokalemia: Secondary | ICD-10-CM

## 2022-04-17 DIAGNOSIS — Z5986 Financial insecurity: Secondary | ICD-10-CM | POA: Diagnosis not present

## 2022-04-17 NOTE — Assessment & Plan Note (Signed)
We consult her medication list today and confirmed she is taking the 100 mg losartan that I recently increased to and reviewed it against her home blood pressures and today's in office blood pressure and I am very pleased with the numbers and she says no side effects so our plan is to just stay at the current medications for now but continue to monitor the blood pressure and let me know if any issues.  I reminded her that salt ibuprofen and alcohol all can contribute to elevated blood pressure and that weight loss can help to lower the blood pressure and reduce the requirement for medications and I am going to do what I can to try to limit the number of medications she has to only the absolutely necessary ones

## 2022-04-17 NOTE — Assessment & Plan Note (Addendum)
Documented and discussed  with me today  Advised her to be sure to have no carpets/pets and have double railings on all stairs... she won't give up the furbaby though.  Warned her blood thinners make this a major risk for her

## 2022-04-17 NOTE — Assessment & Plan Note (Signed)
We discussed her stroke briefly but this is mainly being managed by neurology now and they have advised stay on the Brilinta and Eliquis and cholesterol medicines and good blood pressure control and that this what she is doing I think she is doing a great job.  She is done with rehab for now and she denies choking on any food spite this for her hand pain she feels that she is able to use it to complete all of her activities of daily living without difficulty and so she does not have or need home health anymore.

## 2022-04-17 NOTE — Progress Notes (Signed)
[facility]: [phone]   Routine Medical Office Visit  Patient:  APOLLONIA AMINI      Age: 77 y.o.       Sex:  female  Date:   04/17/2022  PCP:    Loralee Pacas, [place] Provider: Loralee Pacas, MD   Problem Focused Charting:   Medical Decision Making per Assessment/Plan   Genell was seen today for 2 week follow-up, blood pressure and medication review.  Hypertension, unspecified type Assessment & Plan: We consult her medication list today and confirmed she is taking the 100 mg losartan that I recently increased to and reviewed it against her home blood pressures and today's in office blood pressure and I am very pleased with the numbers and she says no side effects so our plan is to just stay at the current medications for now but continue to monitor the blood pressure and let me know if any issues.  I reminded her that salt ibuprofen and alcohol all can contribute to elevated blood pressure and that weight loss can help to lower the blood pressure and reduce the requirement for medications and I am going to do what I can to try to limit the number of medications she has to only the absolutely necessary ones   Neuropathic pain Overview: She is a lot more pain in her right hand that is nerve pain so I suspect she is recovering some from her stroke but   Assessment & Plan: Gabapentin helps but she still has a lot of breakthrough nerve pain so we tried lidocaine but that failed to help so I had also sent in Cymbalta but she was concerned it might cause some sleepiness or other side effects I advised her that if she stays at the lowest low dose that is very unlikely but the wait a few weeks before she judges whether it is helping the nerve pain   History of CVA with residual deficit Overview: Historical review kept here for reference details:   Left thalamic infarction [facility]): Presented with history of HTN, TIA, PAF-on Eliquis was admitted on  01/27/2022 with 3-day history of intermittent numbness right face, right hand and RLE.  She was found to have elevated blood pressure and CT head done showing patchy hypodensity lentiform nuclei bilaterally.  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  MRI was not done due to history of claustrophobia.  She was transferred from [facility] and exam revealed right hemiataxia with right hypoesthesia and jacksonian march type dysesthesias of RLE followed by RUE and face.  She required sedation prior to x-ray and MRI brain done revealing severe atherosclerotic disease with severe stenosis proximal BA.     She underwent cerebral angiogram revealing 85% stenosis proximal to be an ulcerated plaque in left carotid bulb.  She underwent angioplasty with stenting of VA with complete resolution of stenosis on 11/03.  Postprocedure was found to have left facial droop with bidirectional horizontal nystagmus, decrease in left eye closure and right upper/right lower extremity drift concerning for brainstem infarct.  CT of head done revealing interval development of small acute left cerebellar infarcts and no bleed.  She was started on IV heparin as well as Cleviprex to control and Eliquis resumed 11/05.  Aspirin was DC'd and she continues on Brilinta.  She continued to be limited by nystagmus, dysmetria with hemiplegia, sensory deficits on the  right as well as ataxia.  CIR was recommended due to functional decline.  Spent a few days in ICU after the periprocedural CVA.    She was admitted to rehab 02/04/2022 and continued on Eliquis and Brilinta during her stay.  She did report right knee pain with activity.  Voltaren gel and knee sleeve were added for local measures.  Gabapentin has also been added for neuropathic pain right hand.  Dr. Kieth Brightly neuropsychologist has worked with patient on coping and adjustment  issues but she reported a lot of stress prior to and after CVA with overall stable mood  Since leaving the hospital she has taken potassium for a low discharge k level,  Melatonin was for sleep-wake disruption which has worked well in combination with gabapentin..  Constipation has resolved with use of senna twice daily, but she has continued to require it.  .  She has continued to make great progress with HHPT and HHOT after discharge.  patient reports  improvement in functional use RUE  and RLE as well as improvement in awareness.  She is able to complete ADL tasks at modified independent level, denying any falls since coming home.  She is tolerating regular diet without any signs or symptoms of aspiration and speech therapy signed off 11/10 and patient reports she hasn't choked on food at all..  She is modified independent for transfers and to ambulate household distances with use of rollator. Supervision is recommended when in community setting.  Family can provide assistance necessary after discharge.   Still not driving or strenuous activity till cleared by MD , but hasn't had any seizure active  Assessment & Plan: We discussed her stroke briefly but this is mainly being managed by neurology now and they have advised stay on the Brilinta and Eliquis and cholesterol medicines and good blood pressure control and that this what she is doing I think she is doing a great job.  She is done with rehab for now and she denies choking on any food spite this for her hand pain she feels that she is able to use it to complete all of her activities of daily living without difficulty and so she does not have or need home health anymore.   Paroxysmal atrial fibrillation (HCC) Overview: Consistent with brilinta/eliquis as was planned at discharge from hospital for CVA 02/2022 He also point that stress was associated.   She will follow with Dr. Bufford Buttner for cardiology who will probably determine the A-fib load  but she is also got a nice cardiac monitor that she reports has been keeping atrial fibrillation monitored and that it is not happening very often if at all  Assessment & Plan: Does not take an medications for A-fib just the stroke prophylaxis medications   Hypokalemia Overview: Reviewed her chronically low K and she reports she is taking supplement  Lab Results  Component Value Date/Time   K 3.4 (L) 03/11/2022 10:18 AM   K 3.5 02/28/2022 03:33 PM   K 3.4 (L) 02/10/2022 05:10 AM   K 3.6 02/05/2022 05:54 AM   K 3.3 (L) 02/03/2022 05:09 AM   K 3.4 (L) 02/01/2022 12:11 AM   K 3.5 01/31/2022 03:49 PM   Takes supplement   Intracranial atherosclerosis Overview: Lipid Panel     Component Value Date/Time   CHOL 226 (H) 01/28/2022 0319   TRIG 253 (H) 01/28/2022 0319   HDL 35 (L) 01/28/2022 0319   CHOLHDL 6.5 01/28/2022 0319   VLDL 51 (H) 01/28/2022  0319   LDLCALC 140 (H) 01/28/2022 0319      Mixed hyperlipidemia  Financial insecurity due to medical expenses Overview: She is being about 50b ucks a month for Brilinta and 46$ for Eliquis but she was able to get samples I explained I am willing to pay and send in clopidogrel and switch her to Coumadin if she runs and financial distress from this.. 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 she won't give up the furbaby though.  Warned her blood thinners make this a major risk for her     Medical Decision Making: 2 or more stable chronic illnesses Diagnosis or treatment significantly limited by social determinants of health donut hole discussions, continued physical therapy and  occupational therapy home health impacted by this for fall risk management     Subjective - Clinical Presentation:   SHARNE LINDERS is a 77 y.o. female  Patient Active Problem List   Diagnosis Date Noted   Mixed hyperlipidemia 04/17/2022   Financial insecurity due to medical expenses 04/17/2022   At high risk for injury related to fall 04/17/2022   Neuropathic pain 04/03/2022   Hypertensive retinopathy 02/28/2022   Hypokalemia 02/14/2022   Insomnia 02/14/2022   Constipation 02/14/2022   Intracranial atherosclerosis 01/31/2022   History of CVA with residual deficit 01/27/2022   Paroxysmal atrial fibrillation (HCC) 01/27/2022   Hypertension 01/27/2022   Past Medical History:  Diagnosis Date   Acute CVA (cerebrovascular accident) (HCC) 01/27/2022   HTN (hypertension) 01/27/2022   Neuropathic pain 04/03/2022   She is a lot more pain in her right hand that is nerve pain so I suspect she is recovering some from her stroke but I advised that she increase her gabapentin it even though she rather avoid that because of the drowsiness am sending in a higher dose so that she at least has it available for use if the pain gets too much.  I okayed her to return to work.  I offered to send her to physical therapy a   PAF (paroxysmal atrial fibrillation) (HCC)    TIA (transient ischemic attack)     Outpatient Medications Prior to Visit  Medication Sig   amLODipine (NORVASC) 5 MG tablet Take 1 tablet (5 mg total) by mouth daily.   apixaban (ELIQUIS) 5 MG TABS tablet Take 1 tablet (5 mg total) by mouth 2 (two) times daily.   atorvastatin (LIPITOR) 40 MG tablet Take 1 tablet (40 mg total) by mouth daily.   diclofenac Sodium (VOLTAREN) 1 % GEL Apply 2 g topically 4 (four) times daily.   fluticasone (FLONASE) 50 MCG/ACT nasal spray Use 1 spray in each nostril once daily.   gabapentin (NEURONTIN) 100 MG capsule Take 1 capsule (100 mg total) by mouth 3 (three) times daily.   lidocaine (XYLOCAINE) 5 %  ointment Apply 1 Application topically as needed.   loratadine (CLARITIN) 10 MG tablet Take 1 tablet (10 mg total) by mouth daily.   losartan (COZAAR) 100 MG tablet Take 1 tablet (100 mg total) by mouth daily.   melatonin 5 MG TABS Take 1 tablet (5 mg total) by mouth  at bedtime.   potassium chloride (KLOR-CON M) 10 MEQ tablet Take 1 tablet (10 mEq total) by mouth daily.   senna-docusate (SENOKOT-S) 8.6-50 MG tablet Take 1 tablet by mouth 2 (two) times daily.   ticagrelor (BRILINTA) 90 MG TABS tablet Take 1 tablet (90 mg total) by mouth 2 (two) times daily.   [DISCONTINUED] DULoxetine (CYMBALTA) 30 MG capsule Take 30 mg by mouth daily. Then increase to two 30 mg tablets (total 60 mg) by mouth once daily   No facility-administered medications prior to visit.    Chief Complaint  Patient presents with   2 Week Follow-up   Blood pressure   Medication review    Duloxetine has not started, wants to discuss if it's really needed.    HPI  See problem based overviews         Objective:  Physical Exam  BP 124/69 (BP Location: Left Arm, Patient Position: Sitting)   Pulse 70   Temp 98.2 F (36.8 C) (Temporal)   Ht 5\' 4"  (1.626 m)   Wt 158 lb (71.7 kg)   SpO2 97%   BMI 27.12 kg/m   Overweight  by BMI criteria but truncal adiposity (waist circumference or caliper) should be used instead. Wt Readings from Last 10 Encounters:  04/17/22 158 lb (71.7 kg)  04/16/22 157 lb (71.2 kg)  04/03/22 157 lb (71.2 kg)  03/11/22 156 lb (70.8 kg)  02/28/22 156 lb 3.2 oz (70.9 kg)  02/26/22 156 lb 6.4 oz (70.9 kg)  02/04/22 158 lb 1.1 oz (71.7 kg)  01/30/22 165 lb (74.8 kg)  01/16/22 167 lb 3.2 oz (75.8 kg)  12/17/21 162 lb (73.5 kg)   Vital signs reviewed.  Nursing notes reviewed. Weight trend reviewed. General Appearance:  Well developed, well nourished female in no acute distress.   Normal work of breathing at rest Musculoskeletal: All extremities are intact.  Neurological:  Awake, alert,  No  obvious focal neurological deficits or cognitive impairments Psychiatric:  Appropriate mood, pleasant demeanor Problem-specific findings:  she is walking with a limp, fall risk mild.      Results Reviewed: No results found for any visits on 04/17/22.  Recent Results (from the past 2160 hour(s))  Comprehensive metabolic panel     Status: Abnormal   Collection Time: 01/27/22  1:52 PM  Result Value Ref Range   Sodium 141 135 - 145 mmol/L   Potassium 3.6 3.5 - 5.1 mmol/L   Chloride 107 98 - 111 mmol/L   CO2 26 22 - 32 mmol/L   Glucose, Bld 82 70 - 99 mg/dL    Comment: Glucose reference range applies only to samples taken after fasting for at least 8 hours.   BUN 11 8 - 23 mg/dL   Creatinine, Ser 01/29/22 0.44 - 1.00 mg/dL   Calcium 9.0 8.9 - 1.61 mg/dL   Total Protein 6.8 6.5 - 8.1 g/dL   Albumin 3.8 3.5 - 5.0 g/dL   AST 14 (L) 15 - 41 U/L   ALT 15 0 - 44 U/L   Alkaline Phosphatase 56 38 - 126 U/L   Total Bilirubin 0.5 0.3 - 1.2 mg/dL   GFR, Estimated 09.6 >04 mL/min    Comment: (NOTE) Calculated using the CKD-EPI Creatinine Equation (2021)    Anion gap 8 5 - 15    Comment: Performed at [facility], [address] [place], [place]  CBC with Differential     Status: None   Collection Time: 01/27/22  1:52 PM  Result Value Ref Range   WBC 7.1 4.0 - 10.5 K/uL   RBC 4.79 3.87 - 5.11 MIL/uL   Hemoglobin 14.9 12.0 - 15.0 g/dL   HCT 42.5 95.6 - 38.7 %   MCV 92.7 80.0 - 100.0 fL   MCH 31.1 26.0 - 34.0 pg   MCHC 33.6 30.0 - 36.0 g/dL   RDW 56.4 33.2 - 95.1 %   Platelets 253 150 - 400 K/uL   nRBC 0.0 0.0 - 0.2 %   Neutrophils Relative % 62 %   Neutro Abs 4.4 1.7 - 7.7 K/uL   Lymphocytes Relative 26 %   Lymphs Abs 1.8 0.7 - 4.0 K/uL   Monocytes Relative 9 %   Monocytes Absolute 0.6 0.1 - 1.0 K/uL   Eosinophils Relative 2 %   Eosinophils Absolute 0.2 0.0 - 0.5 K/uL   Basophils Relative 1 %   Basophils Absolute 0.0 0.0 - 0.1 K/uL   Immature Granulocytes 0 %   Abs  Immature Granulocytes 0.02 0.00 - 0.07 K/uL    Comment: Performed at [facility], [address], [place], [place] [address]  POC CBG, ED     Status: None   Collection Time: 01/27/22  1:54 PM  Result Value Ref Range   Glucose-Capillary 77 70 - 99 mg/dL    Comment: Glucose reference range applies only to samples taken after fasting for at least 8 hours.  Lipid panel     Status: Abnormal   Collection Time: 01/28/22  3:19 AM  Result Value Ref Range   Cholesterol 226 (H) 0 - 200 mg/dL   Triglycerides 606 (H) <150 mg/dL   HDL 35 (L) >30 mg/dL   Total CHOL/HDL Ratio 6.5 RATIO   VLDL 51 (H) 0 - 40 mg/dL   LDL Cholesterol 160 (H) 0 - 99 mg/dL    Comment:        Total Cholesterol/HDL:CHD Risk Coronary Heart Disease Risk Table                     Men   Women  1/2 Average Risk   3.4   3.3  Average Risk       5.0   4.4  2 X Average Risk   9.6   7.1  3 X Average Risk  23.4   11.0        Use the calculated Patient Ratio above and the CHD Risk Table to determine the patient's CHD Risk.        ATP III CLASSIFICATION (LDL):  <100     mg/dL   Optimal  109-323  mg/dL   Near or Above                    Optimal  130-159  mg/dL   Borderline  557-322  mg/dL   High  >025     mg/dL   Very High Performed at [facility], [address] [place], [place] [address]   Hemoglobin A1c     Status: None   Collection Time: 01/28/22  3:19 AM  Result Value Ref Range   Hgb A1c MFr Bld 5.5 4.8 - 5.6 %    Comment: (NOTE) Pre diabetes:          5.7%-6.4%  Diabetes:              >6.4%  Glycemic control for   <7.0% adults with diabetes    Mean Plasma Glucose 111.15 mg/dL    Comment: Performed  at [facility], [address], [place], [place] [address]  ECHOCARDIOGRAM LIMITED     Status: None   Collection Time: 01/28/22 12:15 PM  Result Value Ref Range   Weight 2,688 oz   Height 63.5 in   BP 195/124 mmHg   S' Lateral 2.50 cm  Protime-INR     Status: None   Collection Time: 01/30/22  3:54 AM   Result Value Ref Range   Prothrombin Time 13.7 11.4 - 15.2 seconds   INR 1.1 0.8 - 1.2    Comment: (NOTE) INR goal varies based on device and disease states. Performed at [facility], [address], [place], [place] [address]   CBC     Status: None   Collection Time: 01/31/22  3:20 AM  Result Value Ref Range   WBC 7.9 4.0 - 10.5 K/uL   RBC 4.43 3.87 - 5.11 MIL/uL   Hemoglobin 14.0 12.0 - 15.0 g/dL   HCT 95.6 38.7 - 56.4 %   MCV 92.1 80.0 - 100.0 fL   MCH 31.6 26.0 - 34.0 pg   MCHC 34.3 30.0 - 36.0 g/dL   RDW 33.2 95.1 - 88.4 %   Platelets 214 150 - 400 K/uL   nRBC 0.0 0.0 - 0.2 %    Comment: Performed at [facility], [address], [place], [place] [address]  Basic metabolic panel     Status: Abnormal   Collection Time: 01/31/22  3:20 AM  Result Value Ref Range   Sodium 141 135 - 145 mmol/L   Potassium 3.0 (L) 3.5 - 5.1 mmol/L   Chloride 112 (H) 98 - 111 mmol/L   CO2 22 22 - 32 mmol/L   Glucose, Bld 107 (H) 70 - 99 mg/dL    Comment: Glucose reference range applies only to samples taken after fasting for at least 8 hours.   BUN 12 8 - 23 mg/dL   Creatinine, Ser 3.01 0.44 - 1.00 mg/dL   Calcium 8.3 (L) 8.9 - 10.3 mg/dL   GFR, Estimated >60 >10 mL/min    Comment: (NOTE) Calculated using the CKD-EPI Creatinine Equation (2021)    Anion gap 7 5 - 15    Comment: Performed at [facility], [address], [place], [place] [address]  POCT Activated clotting time     Status: None   Collection Time: 01/31/22 11:43 AM  Result Value Ref Range   Activated Clotting Time 137 seconds    Comment: Reference range 74-137 seconds for patients not on anticoagulant therapy.  POCT Activated clotting time     Status: None   Collection Time: 01/31/22 12:04 PM  Result Value Ref Range   Activated Clotting Time 191 seconds    Comment: Reference range 74-137 seconds for patients not on anticoagulant therapy.  POCT Activated clotting time     Status: None   Collection  Time: 01/31/22 12:22 PM  Result Value Ref Range   Activated Clotting Time 215 seconds    Comment: Reference range 74-137 seconds for patients not on anticoagulant therapy.  I-STAT 7, (LYTES, BLD GAS, ICA, H+H)     Status: Abnormal   Collection Time: 01/31/22  1:31 PM  Result Value Ref Range   pH, Arterial 7.277 (L) 7.35 - 7.45   pCO2 arterial 54.8 (H) 32 - 48 mmHg   pO2, Arterial 97 83 - 108 mmHg   Bicarbonate 25.5 20.0 - 28.0 mmol/L   TCO2 27 22 - 32 mmol/L  O2 Saturation 96 %   Acid-base deficit 2.0 0.0 - 2.0 mmol/L   Sodium 143 135 - 145 mmol/L   Potassium 3.5 3.5 - 5.1 mmol/L   Calcium, Ion 1.22 1.15 - 1.40 mmol/L   HCT 40.0 36.0 - 46.0 %   Hemoglobin 13.6 12.0 - 15.0 g/dL   Sample type ARTERIAL   I-STAT 7, (LYTES, BLD GAS, ICA, H+H)     Status: Abnormal   Collection Time: 01/31/22  3:49 PM  Result Value Ref Range   pH, Arterial 7.368 7.35 - 7.45   pCO2 arterial 37.8 32 - 48 mmHg   pO2, Arterial 187 (H) 83 - 108 mmHg   Bicarbonate 21.9 20.0 - 28.0 mmol/L   TCO2 23 22 - 32 mmol/L   O2 Saturation 100 %   Acid-base deficit 3.0 (H) 0.0 - 2.0 mmol/L   Sodium 142 135 - 145 mmol/L   Potassium 3.5 3.5 - 5.1 mmol/L   Calcium, Ion 1.17 1.15 - 1.40 mmol/L   HCT 40.0 36.0 - 46.0 %   Hemoglobin 13.6 12.0 - 15.0 g/dL   Patient temperature 16.197.9 F    Sample type ARTERIAL   Glucose, capillary     Status: Abnormal   Collection Time: 01/31/22  6:27 PM  Result Value Ref Range   Glucose-Capillary 136 (H) 70 - 99 mg/dL    Comment: Glucose reference range applies only to samples taken after fasting for at least 8 hours.  Heparin level (unfractionated)     Status: Abnormal   Collection Time: 02/01/22 12:11 AM  Result Value Ref Range   Heparin Unfractionated 0.12 (L) 0.30 - 0.70 IU/mL    Comment: (NOTE) The clinical reportable range upper limit is being lowered to >1.10 to align with the FDA approved guidance for the current laboratory assay.  If heparin results are below expected  values, and patient dosage has  been confirmed, suggest follow up testing of antithrombin III levels. Performed at [facility], [address] [place], [place] [address]   CBC     Status: None   Collection Time: 02/01/22 12:11 AM  Result Value Ref Range   WBC 6.4 4.0 - 10.5 K/uL   RBC 4.27 3.87 - 5.11 MIL/uL   Hemoglobin 13.1 12.0 - 15.0 g/dL   HCT 54.039.4 98.136.0 - 19.146.0 %   MCV 92.3 80.0 - 100.0 fL   MCH 30.7 26.0 - 34.0 pg   MCHC 33.2 30.0 - 36.0 g/dL   RDW 47.813.0 29.511.5 - 62.115.5 %   Platelets 205 150 - 400 K/uL   nRBC 0.0 0.0 - 0.2 %    Comment: Performed at [facility], [address] [place], [place] [address]  Basic metabolic panel     Status: Abnormal   Collection Time: 02/01/22 12:11 AM  Result Value Ref Range   Sodium 141 135 - 145 mmol/L   Potassium 3.4 (L) 3.5 - 5.1 mmol/L   Chloride 110 98 - 111 mmol/L   CO2 21 (L) 22 - 32 mmol/L   Glucose, Bld 118 (H) 70 - 99 mg/dL    Comment: Glucose reference range applies only to samples taken after fasting for at least 8 hours.   BUN 10 8 - 23 mg/dL   Creatinine, Ser 7.840.64 0.44 - 1.00 mg/dL   Calcium 8.7 (L) 8.9 - 10.3 mg/dL   GFR, Estimated >69>60 >62>60 mL/min    Comment: (NOTE) Calculated using the CKD-EPI Creatinine Equation (2021)    Anion gap  10 5 - 15    Comment: Performed at [facility], [place] [address] [place], [place] [address]  APTT     Status: None   Collection Time: 02/01/22 12:11 AM  Result Value Ref Range   aPTT 32 24 - 36 seconds    Comment: Performed at [facility] [address] [place], [place] [address]  Heparin level (unfractionated)     Status: Abnormal   Collection Time: 02/01/22 10:10 AM  Result Value Ref Range   Heparin Unfractionated 0.11 (L) 0.30 - 0.70 IU/mL    Comment: (NOTE) The clinical reportable range upper limit is being lowered to >1.10 to align with the FDA approved guidance for the current laboratory assay.  If heparin results are below expected values, and patient  dosage has  been confirmed, suggest follow up testing of antithrombin III levels. Performed at [facility], [place] [address], [place], [place] [address]   Basic metabolic panel     Status: Abnormal   Collection Time: 02/03/22  5:09 AM  Result Value Ref Range   Sodium 141 135 - 145 mmol/L   Potassium 3.3 (L) 3.5 - 5.1 mmol/L   Chloride 109 98 - 111 mmol/L   CO2 23 22 - 32 mmol/L   Glucose, Bld 96 70 - 99 mg/dL    Comment: Glucose reference range applies only to samples taken after fasting for at least 8 hours.   BUN 14 8 - 23 mg/dL   Creatinine, Ser 0.79 0.44 - 1.00 mg/dL   Calcium 8.7 (L) 8.9 - 10.3 mg/dL   GFR, Estimated >60 >60 mL/min    Comment: (NOTE) Calculated using the CKD-EPI Creatinine Equation (2021)    Anion gap 9 5 - 15    Comment: Performed at [place] [address] [place] [address]  CBC     Status: None   Collection Time: 02/03/22  5:09 AM  Result Value Ref Range   WBC 7.6 4.0 - 10.5 K/uL   RBC 4.44 3.87 - 5.11 MIL/uL   Hemoglobin 13.9 12.0 - 15.0 g/dL   HCT 40.9 36.0 - 46.0 %   MCV 92.1 80.0 - 100.0 fL   MCH 31.3 26.0 - 34.0 pg   MCHC 34.0 30.0 - 36.0 g/dL   RDW 12.9 11.5 - 15.5 %   Platelets 236 150 - 400 K/uL   nRBC 0.0 0.0 - 0.2 %    Comment: Performed at [facility], [place] [address], [place],  [address]  Comprehensive metabolic panel     Status: Abnormal   Collection Time: 02/05/22  5:54 AM  Result Value Ref Range   Sodium 142 135 - 145 mmol/L   Potassium 3.6 3.5 - 5.1 mmol/L   Chloride 111 98 - 111 mmol/L   CO2 23 22 - 32 mmol/L   Glucose, Bld 104 (H) 70 - 99 mg/dL    Comment: Glucose reference range applies only to samples taken after fasting for at least 8 hours.   BUN 16 8 - 23 mg/dL   Creatinine, Ser 0.81 0.44 - 1.00 mg/dL   Calcium 8.9 8.9 - 10.3 mg/dL   Total Protein 6.0 (L) 6.5 - 8.1 g/dL   Albumin 3.3 (L) 3.5 - 5.0 g/dL   AST 31 15 - 41 U/L   ALT 45 (H) 0 - 44 U/L   Alkaline Phosphatase 51 38 -  126 U/L   Total Bilirubin 0.9 0.3 - 1.2  mg/dL   GFR, Estimated >41 >28 mL/min    Comment: (NOTE) Calculated using the CKD-EPI Creatinine Equation (2021)    Anion gap 8 5 - 15    Comment: Performed at [facility], [address] [place], [place] [address]  CBC with Differential/Platelet     Status: None   Collection Time: 02/05/22  5:54 AM  Result Value Ref Range   WBC 6.8 4.0 - 10.5 K/uL   RBC 4.62 3.87 - 5.11 MIL/uL   Hemoglobin 14.2 12.0 - 15.0 g/dL   HCT 72.0 94.7 - 09.6 %   MCV 91.8 80.0 - 100.0 fL   MCH 30.7 26.0 - 34.0 pg   MCHC 33.5 30.0 - 36.0 g/dL   RDW 28.3 66.2 - 94.7 %   Platelets 256 150 - 400 K/uL   nRBC 0.0 0.0 - 0.2 %   Neutrophils Relative % 72 %   Neutro Abs 4.9 1.7 - 7.7 K/uL   Lymphocytes Relative 17 %   Lymphs Abs 1.1 0.7 - 4.0 K/uL   Monocytes Relative 8 %   Monocytes Absolute 0.5 0.1 - 1.0 K/uL   Eosinophils Relative 3 %   Eosinophils Absolute 0.2 0.0 - 0.5 K/uL   Basophils Relative 0 %   Basophils Absolute 0.0 0.0 - 0.1 K/uL   Immature Granulocytes 0 %   Abs Immature Granulocytes 0.03 0.00 - 0.07 K/uL    Comment: Performed at [facility], [address] [place], [place] [address]  Basic metabolic panel     Status: Abnormal   Collection Time: 02/10/22  5:10 AM  Result Value Ref Range   Sodium 140 135 - 145 mmol/L   Potassium 3.4 (L) 3.5 - 5.1 mmol/L   Chloride 110 98 - 111 mmol/L   CO2 20 (L) 22 - 32 mmol/L   Glucose, Bld 97 70 - 99 mg/dL    Comment: Glucose reference range applies only to samples taken after fasting for at least 8 hours.   BUN 16 8 - 23 mg/dL   Creatinine, Ser 0.35 0.44 - 1.00 mg/dL   Calcium 8.6 (L) 8.9 - 10.3 mg/dL   GFR, Estimated >46 >56 mL/min    Comment: (NOTE) Calculated using the CKD-EPI Creatinine Equation (2021)    Anion gap 10 5 - 15    Comment: Performed at [facility], [address] [place], [place] [address]  CBC     Status: None   Collection Time: 02/10/22  5:10 AM  Result Value Ref  Range   WBC 7.8 4.0 - 10.5 K/uL   RBC 4.27 3.87 - 5.11 MIL/uL   Hemoglobin 13.5 12.0 - 15.0 g/dL   HCT 17.0 01.7 - 49.4 %   MCV 93.0 80.0 - 100.0 fL   MCH 31.6 26.0 - 34.0 pg   MCHC 34.0 30.0 - 36.0 g/dL   RDW 49.6 75.9 - 16.3 %   Platelets 268 150 - 400 K/uL   nRBC 0.0 0.0 - 0.2 %    Comment: Performed at [facility], [address], [place], [place] [address]  Basic metabolic panel     Status: None   Collection Time: 02/28/22  3:33 PM  Result Value Ref Range   Glucose, Bld 93 65 - 99 mg/dL    Comment: .            Fasting reference interval .    BUN 17 7 - 25 mg/dL   Creat 9.93 5.70 -  1.00 mg/dL   BUN/Creatinine Ratio SEE NOTE: 6 - 22 (calc)    Comment:    Not Reported: BUN and Creatinine are within    reference range. .    Sodium 143 135 - 146 mmol/L   Potassium 3.5 3.5 - 5.3 mmol/L   Chloride 109 98 - 110 mmol/L   CO2 21 20 - 32 mmol/L   Calcium 8.8 8.6 - 10.4 mg/dL  CBC with Differential     Status: None   Collection Time: 03/11/22 10:18 AM  Result Value Ref Range   WBC 7.5 4.0 - 10.5 K/uL   RBC 4.27 3.87 - 5.11 MIL/uL   Hemoglobin 13.3 12.0 - 15.0 g/dL   HCT 16.140.3 09.636.0 - 04.546.0 %   MCV 94.4 80.0 - 100.0 fL   MCH 31.1 26.0 - 34.0 pg   MCHC 33.0 30.0 - 36.0 g/dL   RDW 40.913.2 81.111.5 - 91.415.5 %   Platelets 264 150 - 400 K/uL   nRBC 0.0 0.0 - 0.2 %   Neutrophils Relative % 76 %   Neutro Abs 5.7 1.7 - 7.7 K/uL   Lymphocytes Relative 15 %   Lymphs Abs 1.1 0.7 - 4.0 K/uL   Monocytes Relative 7 %   Monocytes Absolute 0.5 0.1 - 1.0 K/uL   Eosinophils Relative 2 %   Eosinophils Absolute 0.1 0.0 - 0.5 K/uL   Basophils Relative 0 %   Basophils Absolute 0.0 0.0 - 0.1 K/uL   Immature Granulocytes 0 %   Abs Immature Granulocytes 0.03 0.00 - 0.07 K/uL    Comment: Performed at [facility], [address] [place], [place] [address]  Comprehensive metabolic panel     Status: Abnormal   Collection Time: 03/11/22 10:18 AM  Result Value Ref Range   Sodium 144 135 - 145  mmol/L   Potassium 3.4 (L) 3.5 - 5.1 mmol/L   Chloride 113 (H) 98 - 111 mmol/L   CO2 22 22 - 32 mmol/L   Glucose, Bld 94 70 - 99 mg/dL    Comment: Glucose reference range applies only to samples taken after fasting for at least 8 hours.   BUN 10 8 - 23 mg/dL   Creatinine, Ser 6.210.76 0.44 - 1.00 mg/dL   Calcium 8.8 (L) 8.9 - 10.3 mg/dL   Total Protein 6.2 (L) 6.5 - 8.1 g/dL   Albumin 3.5 3.5 - 5.0 g/dL   AST 20 15 - 41 U/L   ALT 21 0 - 44 U/L   Alkaline Phosphatase 64 38 - 126 U/L   Total Bilirubin 0.7 0.3 - 1.2 mg/dL   GFR, Estimated >30>60 >86>60 mL/min    Comment: (NOTE) Calculated using the CKD-EPI Creatinine Equation (2021)    Anion gap 9 5 - 15    Comment: Performed at [facility], [address] [place], [place] [address]  Urinalysis, Routine w reflex microscopic Urine, Clean Catch     Status: Abnormal   Collection Time: 03/11/22 11:42 AM  Result Value Ref Range   Color, Urine AMBER (A) YELLOW    Comment: BIOCHEMICALS MAY BE AFFECTED BY COLOR   APPearance CLOUDY (A) CLEAR   Specific Gravity, Urine 1.021 1.005 - 1.030   pH 5.0 5.0 - 8.0   Glucose, UA NEGATIVE NEGATIVE mg/dL   Hgb urine dipstick SMALL (A) NEGATIVE   Bilirubin Urine NEGATIVE NEGATIVE   Ketones, ur NEGATIVE NEGATIVE mg/dL   Protein, ur 30 (A) NEGATIVE mg/dL   Nitrite POSITIVE (A) NEGATIVE   Leukocytes,Ua MODERATE (  A) NEGATIVE   RBC / HPF 0-5 0 - 5 RBC/hpf   WBC, UA 21-50 0 - 5 WBC/hpf   Bacteria, UA RARE (A) NONE SEEN   Squamous Epithelial / HPF 0-5 0 - 5   Mucus PRESENT    Hyaline Casts, UA PRESENT    Uric Acid Crys, UA PRESENT     Comment: Performed at [facility], [address] [place], [place] [address]  Urine Culture     Status: Abnormal   Collection Time: 03/11/22 12:24 PM   Specimen: Urine, Clean Catch  Result Value Ref Range   Specimen Description      URINE, CLEAN CATCH Performed at [facility], [address] [place], [place] [address]    Special Requests      NONE Performed at [facility], [address], [place], [place] [address]    Culture >=100,000 COLONIES/mL ESCHERICHIA COLI (A)    Report Status 03/13/2022 FINAL    Organism ID, Bacteria ESCHERICHIA COLI (A)       Susceptibility   Escherichia coli - MIC*    AMPICILLIN 8 SENSITIVE Sensitive     CEFAZOLIN <=4 SENSITIVE Sensitive     CEFEPIME <=0.12 SENSITIVE Sensitive     CEFTRIAXONE <=0.25 SENSITIVE Sensitive     CIPROFLOXACIN <=0.25 SENSITIVE Sensitive     GENTAMICIN <=1 SENSITIVE Sensitive     IMIPENEM 0.5 SENSITIVE Sensitive     NITROFURANTOIN <=16 SENSITIVE Sensitive     TRIMETH/SULFA <=20 SENSITIVE Sensitive     AMPICILLIN/SULBACTAM 4 SENSITIVE Sensitive     PIP/TAZO <=4 SENSITIVE Sensitive     * >=100,000 COLONIES/mL ESCHERICHIA COLI          Signed: Lula Olszewski, MD 04/17/2022 5:22 PM

## 2022-04-17 NOTE — Assessment & Plan Note (Signed)
Does not take an medications for A-fib just the stroke prophylaxis medications

## 2022-04-17 NOTE — Assessment & Plan Note (Signed)
Gabapentin helps but she still has a lot of breakthrough nerve pain so we tried lidocaine but that failed to help so I had also sent in Cymbalta but she was concerned it might cause some sleepiness or other side effects I advised her that if she stays at the lowest low dose that is very unlikely but the wait a few weeks before she judges whether it is helping the nerve pain

## 2022-04-30 ENCOUNTER — Ambulatory Visit: Payer: Medicare HMO | Admitting: Physician Assistant

## 2022-04-30 ENCOUNTER — Telehealth: Payer: Self-pay | Admitting: Internal Medicine

## 2022-04-30 NOTE — Telephone Encounter (Signed)
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 Please Advise.

## 2022-05-07 NOTE — Telephone Encounter (Signed)
Pt already scheduled on 05/15/22, would you like to try to see her sooner? Or you want me to just let her know to keep the appt she has and just go to urgent care if it gets worse before the 15th ?

## 2022-05-07 NOTE — Telephone Encounter (Signed)
Pt states she just wanted medication for swelling so she didn't have to come in next week

## 2022-05-09 ENCOUNTER — Encounter: Payer: Self-pay | Admitting: Physical Medicine & Rehabilitation

## 2022-05-09 ENCOUNTER — Encounter: Payer: Medicare HMO | Attending: Registered Nurse | Admitting: Physical Medicine & Rehabilitation

## 2022-05-09 VITALS — BP 145/78 | HR 63 | Ht 64.0 in | Wt 154.0 lb

## 2022-05-09 DIAGNOSIS — I6930 Unspecified sequelae of cerebral infarction: Secondary | ICD-10-CM | POA: Insufficient documentation

## 2022-05-09 NOTE — Progress Notes (Signed)
Subjective:    Patient ID: Anita Fletcher, female    DOB: 11/15/1945, 76 y.o.   MRN: [id number] Admit date: 02/04/2022 Discharge date: 02/13/2022    76 y.o. female with history of HTN, TIA, PAF-on Eliquis was admitted on 01/27/2022 with 3-day history of intermittent numbness right face, right hand and RLE.  She was found to have elevated blood pressure and CT head done showing patchy hypodensity lentiform nuclei bilaterally.  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  MRI was not done due to history of claustrophobia.  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  She required sedation prior to x-ray and MRI brain done revealing severe atherosclerotic disease with severe stenosis proximal BA.     She underwent cerebral angiogram revealing 85% stenosis proximal to be an ulcerated plaque in left carotid bulb.  She underwent angioplasty with stenting of VA with complete resolution of stenosis on 11/03.  Postprocedure was found to have left facial droop with bidirectional horizontal nystagmus, decrease in left eye closure and right upper/right lower extremity drift concerning for brainstem infarct.  CT of head done revealing interval development of small acute left cerebellar infarcts and no bleed.  She was started on IV heparin as well as Cleviprex to control and Eliquis resumed 11/05.  Aspirin was DC'd and she continues on Brilinta.  She continues to be limited by nystagmus, dysmetria with hemiplegia, sensory deficits on the right as well as ataxia.  CIR was recommended due to functional decline. HPI CC:  Right hand and foot numbness  Went back to work and has noted increased numbness in the Right hand mainy at the end of the day  No falls Mod I with 15 stairs , bedroom upstairs uses  one handrail Not driving yet except on private road , does not feel ready to advance this yet  Mod I bathing and dressing Patient has followed up with neurology as well as primary care since discharge from the hospital Pain Inventory Average Pain 7 Pain Right Now 7 My pain is constant, burning, and tingling  In the last 24 hours, has pain interfered with the following? General activity 3 Relation with others 3 Enjoyment of life 3 What TIME of day is your pain at its worst? evening Sleep (in general) Good  Pain is worse with: inactivity Pain improves with: rest, heat/ice, and medication Relief from Meds: 7  Family History  Problem Relation Age of Onset   Alcoholism Father    Arrhythmia Brother    Diabetes Brother    Mental illness Brother    Social History   Socioeconomic History   Marital status: Single    Spouse name: Not on file   Number of children: 0   Years of education: Not on file   Highest education level: Not on file  Occupational History   Occupation: Resume building  Tobacco Use   Smoking status: Never   Smokeless tobacco: Never  Vaping Use   Vaping Use: Never used  Substance and Sexual Activity   Alcohol use: No   Drug use: No   Sexual activity: Not on file  Other Topics Concern   Not on file  Social History Narrative   Not on file   Social Determinants of Health   Financial Resource Strain: Not on file  Food  Insecurity: No Food Insecurity (03/18/2022)   Hunger Vital Sign    Worried About Running Out of Food in the Last Year: Never true    Ran Out of Food in the Last Year: Never true  Transportation Needs: No Transportation Needs (03/18/2022)   PRAPARE - Hydrologist (Medical): No    Lack of Transportation (Non-Medical): No  Physical Activity: Not on file  Stress: Not on file  Social Connections: Not on file   Past Surgical History:  Procedure Laterality Date   EYE SURGERY     IR ANGIO INTRA EXTRACRAN SEL COM  CAROTID INNOMINATE BILAT MOD SED  01/30/2022   IR ANGIO VERTEBRAL SEL VERTEBRAL UNI L MOD SED  01/30/2022   IR ANGIO VERTEBRAL SEL VERTEBRAL UNI L MOD SED  01/31/2022   IR ANGIOGRAM FOLLOW UP STUDY  01/31/2022   IR CT HEAD LTD  01/31/2022   IR INTRA CRAN STENT  01/31/2022   IR US GUIDE VASC ACCESS LEFT  01/31/2022   IR US GUIDE VASC ACCESS RIGHT  01/30/2022   RADIOLOGY WITH ANESTHESIA N/A 01/30/2022   Procedure: MRI WITH ANESTHESIA - BRAIN W/O CONSTRAST;  Surgeon: Radiologist, Medication, MD;  Location: [place];  Service: Radiology;  Laterality: N/A;   RADIOLOGY WITH ANESTHESIA N/A 01/31/2022   Procedure: RADIOLOGY WITH ANESTHESIA;  Surgeon: Radiologist, Medication, MD;  Location: [place];  Service: Radiology;  Laterality: N/A;   Past Surgical History:  Procedure Laterality Date   EYE SURGERY     IR ANGIO INTRA EXTRACRAN SEL COM CAROTID INNOMINATE BILAT MOD SED  01/30/2022   IR ANGIO VERTEBRAL SEL VERTEBRAL UNI L MOD SED  01/30/2022   IR ANGIO VERTEBRAL SEL VERTEBRAL UNI L MOD SED  01/31/2022   IR ANGIOGRAM FOLLOW UP STUDY  01/31/2022   IR CT HEAD LTD  01/31/2022   IR INTRA CRAN STENT  01/31/2022   IR US GUIDE VASC ACCESS LEFT  01/31/2022   IR US GUIDE VASC ACCESS RIGHT  01/30/2022   RADIOLOGY WITH ANESTHESIA N/A 01/30/2022   Procedure: MRI WITH ANESTHESIA - BRAIN W/O CONSTRAST;  Surgeon: Radiologist, Medication, MD;  Location: [place];  Service: Radiology;  Laterality: N/A;   RADIOLOGY WITH ANESTHESIA N/A 01/31/2022   Procedure: RADIOLOGY WITH ANESTHESIA;  Surgeon: Radiologist, Medication, MD;  Location: [place];  Service: Radiology;  Laterality: N/A;   Past Medical History:  Diagnosis Date   Acute CVA (cerebrovascular accident) ([place]) 01/27/2022   HTN (hypertension) 01/27/2022   Neuropathic pain 04/03/2022   She is a lot more pain in her right hand that is nerve pain so I suspect she is recovering some from her stroke but I advised that she increase her gabapentin it even though she rather avoid that because  of the drowsiness am sending in a higher dose so that she at least has it available for use if the pain gets too much.  I okayed her to return to work.  I offered to send her to physical therapy a   PAF (paroxysmal atrial fibrillation) (HCC)    TIA (transient ischemic attack)    Ht 5' 4"$  (1.626 m)   Wt 154 lb (69.9 kg)   BMI 26.43 kg/m   Opioid Risk Score:   Fall Risk Score:  `1  Depression screen [facility] 2/9     05/09/2022   12:58 PM 02/26/2022    1:28 PM  Depression screen PHQ 2/9  Decreased Interest 0 1  Down, Depressed, Hopeless 0 1  PHQ - 2 Score 0 2  Altered sleeping  0  Tired, decreased energy  3  Change in appetite  1  Feeling bad or failure about yourself   1  Trouble concentrating  0  Moving slowly or fidgety/restless  0  Suicidal thoughts  0  PHQ-9 Score  7  Difficult doing work/chores  Somewhat difficult      Review of Systems  Musculoskeletal:  Positive for gait problem.       Right arm pain  All other systems reviewed and are negative.     Objective:   Physical Exam  General no acute distress Mood and affect are appropriate Extremities without edema Motor strength is 5/5 bilateral deltoid bicep tricep grip hip flexion knee extension ankle dorsiflexion plantarflexion Sensation intact to light touch and proprioception in the right upper limb intact to light touch in the lateral aspect of the foot but not medial aspect of the foot and toes on the right side. Ambulates without assistive device no evidence of toe drag or knee instability No evidence of hyperalgesia, no movement allodynia      Assessment & Plan:  #1.  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  Overall doing quite well, she does not need any formalized PT or OT at this time.  She may return to driving on a limited basis, quiet, local streets only.  Her other physicians may clear her from here She does need neuro  follow-up but does not need physical medicine rehab follow-up.

## 2022-05-14 NOTE — Progress Notes (Signed)
Cardiology Clinic Note   Patient Name: Anita Fletcher Date of Encounter: 05/16/2022  Primary Care Provider:  Loralee Pacas, MD Primary Cardiologist:  Evalina Field, MD  Patient Profile    Anita Fletcher 77 year old female presents the clinic today for follow-up evaluation of her paroxysmal atrial fibrillation and hypertension.  Past Medical History    Past Medical History:  Diagnosis Date   Acute CVA (cerebrovascular accident) ([place]) 01/27/2022   HTN (hypertension) 01/27/2022   Neuropathic pain 04/03/2022   She is a lot more pain in her right hand that is nerve pain so I suspect she is recovering some from her stroke but I advised that she increase her gabapentin it even though she rather avoid that because of the drowsiness am sending in a higher dose so that she at least has it available for use if the pain gets too much.  I okayed her to return to work.  I offered to send her to physical therapy a   PAF (paroxysmal atrial fibrillation) ([place])    TIA (transient ischemic attack)    Past Surgical History:  Procedure Laterality Date   EYE SURGERY     IR ANGIO INTRA EXTRACRAN SEL COM CAROTID INNOMINATE BILAT MOD SED  01/30/2022   IR ANGIO VERTEBRAL SEL VERTEBRAL UNI L MOD SED  01/30/2022   IR ANGIO VERTEBRAL SEL VERTEBRAL UNI L MOD SED  01/31/2022   IR ANGIOGRAM FOLLOW UP STUDY  01/31/2022   IR CT HEAD LTD  01/31/2022   IR INTRA CRAN STENT  01/31/2022   IR US GUIDE VASC ACCESS LEFT  01/31/2022   IR US GUIDE VASC ACCESS RIGHT  01/30/2022   RADIOLOGY WITH ANESTHESIA N/A 01/30/2022   Procedure: MRI WITH ANESTHESIA - BRAIN W/O CONSTRAST;  Surgeon: Radiologist, Medication, MD;  Location: [id number];  Service: Radiology;  Laterality: N/A;   RADIOLOGY WITH ANESTHESIA N/A 01/31/2022   Procedure: RADIOLOGY WITH ANESTHESIA;  Surgeon: Radiologist, Medication, MD;  Location: [place];  Service: Radiology;  Laterality: N/A;    Allergies  Allergies  Allergen Reactions   Codeine     History of  Present Illness    Anita Fletcher has a PMH of paroxysmal atrial fibrillation hypertension, CVA with residual deficit, hypokalemia, insomnia, constipation, and neuropathic pain.  CHA2DS2-VASc score 3 (age, hypertension, female).  Echocardiogram showed normal LV function.  She was seen in follow-up by Dr. Audie Box on 01/16/2022.  During that time there were concerns for anterior ischemia on her nuclear medicine stress test.  Her previous monitor showed no recurrent atrial fibrillation.  Her blood pressure was elevated at 160/100.  She indicated that her blood pressure was also elevated at home.  She denied recurrent atrial fibrillation/palpitations.  She was using a [employer] mobile device.  She denied chest pain and trouble with her breathing.  She did not have formal exercise routine.  She reported compliance with apixaban and denied bleeding issues.  A BMP and coronary CTA were ordered but not completed.  Losartan 50 mg daily was also prescribed.  She presents to the clinic today for follow-up evaluation and states she had to CVA.  Her first CVA was October 30.  She was undergoing carotid endarterectomy and had a second CVA during the procedure.  She was then sent to rehab and is not driving at this time.  She was unable to complete coronary CTA.  We reviewed her previous stress testing and she expressed understanding.  She does not feel that  she is taking metoprolol and has noted episodes of atrial fibrillation.  I will start metoprolol tartrate 12.5 mg daily and have instructed her that she may take an extra 12.5 mg for episodes of sustained palpitations.  We reviewed triggers for atrial fibrillation.  I will plan follow-up in 3 to 4 months.  Today she denies chest pain, shortness of breath, lower extremity edema, fatigue, palpitations, melena, hematuria, hemoptysis, diaphoresis, weakness, presyncope, syncope, orthopnea, and PND.   Home Medications    Prior to Admission medications   Medication Sig Start  Date End Date Taking? Authorizing Provider  amLODipine (NORVASC) 5 MG tablet Take 1 tablet (5 mg total) by mouth daily. 02/28/22   Loralee Pacas, MD  apixaban (ELIQUIS) 5 MG TABS tablet Take 1 tablet (5 mg total) by mouth 2 (two) times daily. 02/28/22   Loralee Pacas, MD  atorvastatin (LIPITOR) 40 MG tablet Take 1 tablet (40 mg total) by mouth daily. 02/28/22   Loralee Pacas, MD  diclofenac Sodium (VOLTAREN) 1 % GEL Apply 2 g topically 4 (four) times daily. 02/13/22   Love, Ivan Anchors, PA-C  fluticasone (FLONASE) 50 MCG/ACT nasal spray Use 1 spray in each nostril once daily. 02/14/22   Love, Ivan Anchors, PA-C  gabapentin (NEURONTIN) 100 MG capsule Take 1 capsule (100 mg total) by mouth 3 (three) times daily. 04/03/22   Loralee Pacas, MD  lidocaine (XYLOCAINE) 5 % ointment Apply 1 Application topically as needed. 04/03/22   Loralee Pacas, MD  loratadine (CLARITIN) 10 MG tablet Take 1 tablet (10 mg total) by mouth daily. 03/18/22   Loralee Pacas, MD  losartan (COZAAR) 100 MG tablet Take 1 tablet (100 mg total) by mouth daily. 04/03/22   Loralee Pacas, MD  melatonin 5 MG TABS Take 1 tablet (5 mg total) by mouth at bedtime. 02/28/22   Loralee Pacas, MD  metoprolol tartrate (LOPRESSOR) 25 MG tablet Take 25 mg by mouth daily. 04/23/22   [provider]  potassium chloride (KLOR-CON M) 10 MEQ tablet Take 1 tablet (10 mEq total) by mouth daily. 03/18/22   Loralee Pacas, MD  senna-docusate (SENOKOT-S) 8.6-50 MG tablet Take 1 tablet by mouth 2 (two) times daily. 02/28/22   Loralee Pacas, MD  ticagrelor (BRILINTA) 90 MG TABS tablet Take 1 tablet (90 mg total) by mouth 2 (two) times daily. 02/28/22   Loralee Pacas, MD  DULoxetine (CYMBALTA) 30 MG capsule Take 30 mg by mouth daily. Then increase to two 30 mg tablets (total 60 mg) by mouth once daily  04/17/22  [provider]    Family History    Family History  Problem Relation Age of Onset   Alcoholism Father    Arrhythmia  Brother    Diabetes Brother    Mental illness Brother    She indicated that her mother is deceased. She indicated that her father is deceased. She indicated that only one of her two brothers is alive.  Social History    Social History   Socioeconomic History   Marital status: Single    Spouse name: Not on file   Number of children: 0   Years of education: Not on file   Highest education level: Not on file  Occupational History   Occupation: Resume building  Tobacco Use   Smoking status: Never   Smokeless tobacco: Never  Vaping Use   Vaping Use: Never used  Substance and Sexual Activity   Alcohol use: No  Drug use: No   Sexual activity: Not on file  Other Topics Concern   Not on file  Social History Narrative   Not on file   Social Determinants of Health   Financial Resource Strain: Not on file  Food Insecurity: No Food Insecurity (03/18/2022)   Hunger Vital Sign    Worried About Running Out of Food in the Last Year: Never true    Ran Out of Food in the Last Year: Never true  Transportation Needs: No Transportation Needs (03/18/2022)   PRAPARE - Hydrologist (Medical): No    Lack of Transportation (Non-Medical): No  Physical Activity: Not on file  Stress: Not on file  Social Connections: Not on file  Intimate Partner Violence: Not At Risk (01/29/2022)   Humiliation, Afraid, Rape, and Kick questionnaire    Fear of Current or Ex-Partner: No    Emotionally Abused: No    Physically Abused: No    Sexually Abused: No     Review of Systems    General:  No chills, fever, night sweats or weight changes.  Cardiovascular:  No chest pain, dyspnea on exertion, edema, orthopnea, palpitations, paroxysmal nocturnal dyspnea. Dermatological: No rash, lesions/masses Respiratory: No cough, dyspnea Urologic: No hematuria, dysuria Abdominal:   No nausea, vomiting, diarrhea, bright red blood per rectum, melena, or hematemesis Neurologic:  No visual  changes, wkns, changes in mental status. All other systems reviewed and are otherwise negative except as noted above.  Physical Exam    VS:  BP (!) 140/80   Ht 5' 4"$  (1.626 m)   Wt 159 lb 12.8 oz (72.5 kg)   BMI 27.43 kg/m  , BMI Body mass index is 27.43 kg/m. GEN: Well nourished, well developed, in no acute distress. HEENT: normal. Neck: Supple, no JVD, carotid bruits, or masses. Cardiac: RRR, no murmurs, rubs, or gallops. No clubbing, cyanosis, edema.  Radials/DP/PT 2+ and equal bilaterally.  Respiratory:  Respirations regular and unlabored, clear to auscultation bilaterally. GI: Soft, nontender, nondistended, BS + x 4. MS: no deformity or atrophy. Skin: warm and dry, no rash. Neuro:  Strength and sensation are intact. Psych: Normal affect.  Accessory Clinical Findings    Recent Labs: 08/24/2021: Magnesium 2.0; TSH 1.526 03/11/2022: ALT 21; BUN 10; Creatinine, Ser 0.76; Hemoglobin 13.3; Platelets 264; Potassium 3.4; Sodium 144   Recent Lipid Panel    Component Value Date/Time   CHOL 226 (H) 01/28/2022 0319   TRIG 253 (H) 01/28/2022 0319   HDL 35 (L) 01/28/2022 0319   CHOLHDL 6.5 01/28/2022 0319   VLDL 51 (H) 01/28/2022 0319   LDLCALC 140 (H) 01/28/2022 0319    HYPERTENSION CONTROL Vitals:   05/16/22 1420 05/16/22 1437  BP: (!) 153/92 (!) 140/80    The patient's blood pressure is elevated above target today.  In order to address the patient's elevated BP: Blood pressure will be monitored at home to determine if medication changes need to be made.; A new medication was prescribed today.       ECG personally reviewed by me today-none today.  Echocardiogram 01/28/2022 IMPRESSIONS     1. Limited study.   2. Left ventricular ejection fraction, by estimation, is 70 to 75%. The  left ventricle has hyperdynamic function. 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 Left ventricular diastolic  function could not be evaluated.    3. The mitral valve is abnormal, mild to moderate annular calcification.   4. The aortic valve  is tricuspid. Aortic valve sclerosis is present, with  no evidence of aortic valve stenosis.   5. The inferior vena cava is normal in size with greater than 50%  respiratory variability, suggesting right atrial pressure of 3 mmHg.   FINDINGS   Left Ventricle: Left ventricular ejection fraction, by estimation, is 70  to 75%. The left ventricle has hyperdynamic function. The left ventricular  internal cavity size was normal in size. There is severe asymmetric left  ventricular hypertrophy of the  basal segment. Left ventricular diastolic function could not be evaluated.   Pericardium: There is no evidence of pericardial effusion. Presence of  epicardial fat layer.   Mitral Valve: The mitral valve is abnormal. Mild to moderate mitral  annular calcification.   Tricuspid Valve: The tricuspid valve is grossly normal.   Aortic Valve: The aortic valve is tricuspid. There is mild aortic valve  annular calcification. Aortic valve sclerosis is present, with no evidence  of aortic valve stenosis.   Aorta: The aortic root is normal in size and structure.   Venous: The inferior vena cava is normal in size with greater than 50%  respiratory variability, suggesting right atrial pressure of 3 mmHg.   IAS/Shunts: The interatrial septum was not assessed.    Nuclear stress test 12/17/2021    Findings are consistent with ischemia. The study is low-intermediate risk.   No ST deviation was noted. ECG rhythm shows sinus bradycardia at rest.   LV perfusion is abnormal. There is evidence of ischemia. Defect 1: There is a small defect with moderate reduction in uptake present in the apical to mid anterior location(s) that is partially reversible (see graphic). There is abnormal wall motion in the defect area. Consistent with ischemia given anteroapical wall motion abnormality.   Left ventricular function is  grossly normal. Nuclear stress EF: 50 %, however visually appears closer to 60%.  End diastolic cavity size is normal. End systolic cavity size is normal.   Prior study not available for comparison.  Assessment & Plan   1.  Paroxysmal atrial fibrillation-heart rate today 68.  Reports compliance with apixaban and denies bleeding issues.  Denies recent trauma.  Reports that she does not feel she is taking metoprolol.  She has had 1 episode of atrial fibrillation in December 12.  It was not sustained. Continue  apixaban Start metoprolol 12.5 mg daily.  May take an extra 12.5 mg for sustained palpitations. Heart healthy low-sodium diet-salty 6 given Increase physical activity as tolerated Avoid triggers caffeine, chocolate, EtOH, dehydration etc.  Essential hypertension-BP today 140/80.  Metoprolol is listed on her medication list but she does not feel she is taking the medication.  Given her elevated blood pressure I will have her start metoprolol tartrate 12.5 mg daily Continue  losartan, amlodipine Heart healthy low-sodium diet-salty 6 given Increase physical activity as tolerated   Abnormal cardiac stress test-she was noted to have mild anterior defect on nuclear stress testing.  It was felt that this may be related to artifact.  A coronary CTA was ordered for further evaluation but not completed.  She had 2 strokes towards the end of the year. Her stroke was October 30 and she went in for carotid stenting and had second CVA she is right side affected.  She is unable to drive at this time and denies episodes of chest discomfort.  She would like to postpone coronary CTA. Reorder coronary CTA at f/u visit   Disposition: Follow-up with Dr. Audie Box or me in 3-4  months.   Jossie Ng. Yassmin Binegar NP-C     05/16/2022, 2:38 PM [place] [address] Office [phone] Fax [phone]    I spent 14 minutes examining this patient, reviewing medications,  and using patient centered shared decision making involving her cardiac care.  Prior to her visit I spent greater than 20 minutes reviewing her past medical history,  medications, and prior cardiac tests.

## 2022-05-15 ENCOUNTER — Ambulatory Visit: Payer: Medicare HMO | Admitting: Internal Medicine

## 2022-05-16 ENCOUNTER — Encounter: Payer: Self-pay | Admitting: General Practice

## 2022-05-16 ENCOUNTER — Ambulatory Visit: Payer: Medicare HMO | Attending: Physician Assistant | Admitting: General Practice

## 2022-05-16 VITALS — BP 140/80 | Ht 64.0 in | Wt 159.8 lb

## 2022-05-16 DIAGNOSIS — R9439 Abnormal result of other cardiovascular function study: Secondary | ICD-10-CM | POA: Diagnosis not present

## 2022-05-16 DIAGNOSIS — I480 Paroxysmal atrial fibrillation: Secondary | ICD-10-CM | POA: Diagnosis not present

## 2022-05-16 DIAGNOSIS — I10 Essential (primary) hypertension: Secondary | ICD-10-CM | POA: Diagnosis not present

## 2022-05-16 NOTE — Patient Instructions (Signed)
Medication Instructions:  TAKE METOPROLOL TARTRATE 12.5MG DAILY; MAY TAKE EXTRA 12.5 IF YOUR AFIB LASTS LONGER THAN 20MIN *If you need a refill on your cardiac medications before your next appointment, please call your pharmacy*  Lab Work: NONE If you have labs (blood work) drawn today and your tests are completely normal, you will receive your results only by: [place] (if you have MyChart) OR  A paper copy in the mail If you have any lab test that is abnormal or we need to change your treatment, we will call you to review the results.  Testing/Procedures: NONE  Other Instructions HOLD OFF FOR NOW WITH YOUR CT  Please try to avoid these triggers: Do not use any products that have nicotine or tobacco in them. These include cigarettes, e-cigarettes, and chewing tobacco. If you need help quitting, ask your doctor. Eat heart-healthy foods. Talk with your doctor about the right eating plan for you. Exercise regularly as told by your doctor. Stay hydrated Do not drink alcohol, Caffeine or chocolate. Lose weight if you are overweight. Do not use drugs, including cannabis  Follow-Up: At [facility], you and your health needs are our priority.  As part of our continuing mission to provide you with exceptional heart care, we have created designated Provider Care Teams.  These Care Teams include your primary Cardiologist (physician) and Advanced Practice Providers (APPs -  Physician Assistants and Nurse Practitioners) who all work together to provide you with the care you need, when you need it.  Your next appointment:   3-4 month(s)  Provider:   Evalina Field, MD  or Coletta Memos, FNP

## 2022-05-19 ENCOUNTER — Telehealth: Payer: Self-pay | Admitting: Cardiovascular Disease

## 2022-05-19 MED ORDER — METOPROLOL TARTRATE 25 MG PO TABS: 12.5000 mg | ORAL_TABLET | Freq: Every day | ORAL | 3 refills | Status: DC

## 2022-05-19 NOTE — Telephone Encounter (Signed)
Pt returning call

## 2022-05-19 NOTE — Telephone Encounter (Signed)
Pt c/o medication issue:  1. Name of Medication:  metoprolol tartrate (LOPRESSOR) 25 MG tablet  2. How are you currently taking this medication (dosage and times per day)?   3. Are you having a reaction (difficulty breathing--STAT)?   4. What is your medication issue?   Patient states during ger last visit with Coletta Memos Metoprolol was discussed, but she states she checked her medication list and she had never taken Metoprolol.

## 2022-05-19 NOTE — Telephone Encounter (Signed)
Left message for patient to return the call.

## 2022-05-19 NOTE — Telephone Encounter (Signed)
Called pt back . She states the looked over all of her lists and she states "it just popped up and now I'm supposed to start taking it?" After looking over pt's chart she was to start Metoprolol tartrate 25 mg twice daily 09/23/2021. Per pt's last visit with Denyse Amass on 05/16/2022 she is to start taking Metoprolol Tartrate 12.5 mg once daily. She states "well I want to know what happened to this medication when was in the hospital. Pt willing to start Metoprolol 12.5 mg once daily. Prescription sent to pharmacy.

## 2022-05-23 ENCOUNTER — Telehealth: Payer: Self-pay | Admitting: Cardiovascular Disease

## 2022-05-23 NOTE — Telephone Encounter (Signed)
Pt c/o medication issue:  1. Name of Medication: Metoprolol  2. How are you currently taking this medication (dosage and times per day)?   3. Are you having a reaction (difficulty breathing--STAT)?   4. What is your medication issue?  Patient said she had a stroke in October and November of 2023. When she was in the hospital for the strokes, they stopped her Metoprolol . When she saw Denyse Amass on 05-16-22, he said she need to be taking the Metoprolol. Her question to Dr Audie Box  is, since the Metoprolol was stopped while she was in the hospital, does she need to start back taking it now?

## 2022-05-23 NOTE — Telephone Encounter (Signed)
Spoke with pt regarding her metoprolol tartrate being added back on to her medication list. Pt states that she is unsure why she was taken off of this medication while she was in the hospital back in October and November and now she is getting put back on it. Per Jesse's note will add back on to her medication list for elevated blood pressure. Explained this to pt. She said she just wanted to make sure that she is supposed to be back on this medication. Answered all questions for pt. She verbalizes understanding.

## 2022-06-04 ENCOUNTER — Telehealth: Payer: Self-pay | Admitting: Internal Medicine

## 2022-06-04 NOTE — Telephone Encounter (Signed)
error 

## 2022-06-09 ENCOUNTER — Ambulatory Visit: Payer: Medicare HMO | Admitting: Internal Medicine

## 2022-06-18 ENCOUNTER — Encounter: Payer: Self-pay | Admitting: Internal Medicine

## 2022-06-18 ENCOUNTER — Ambulatory Visit (INDEPENDENT_AMBULATORY_CARE_PROVIDER_SITE_OTHER): Payer: Medicare HMO | Admitting: Internal Medicine

## 2022-06-18 VITALS — BP 140/90 | HR 66 | Temp 98.2°F | Ht 64.0 in | Wt 157.2 lb

## 2022-06-18 DIAGNOSIS — Z1211 Encounter for screening for malignant neoplasm of colon: Secondary | ICD-10-CM | POA: Diagnosis not present

## 2022-06-18 DIAGNOSIS — G629 Polyneuropathy, unspecified: Secondary | ICD-10-CM

## 2022-06-18 DIAGNOSIS — Z9189 Other specified personal risk factors, not elsewhere classified: Secondary | ICD-10-CM | POA: Diagnosis not present

## 2022-06-18 DIAGNOSIS — I10 Essential (primary) hypertension: Secondary | ICD-10-CM

## 2022-06-18 DIAGNOSIS — M7989 Other specified soft tissue disorders: Secondary | ICD-10-CM | POA: Diagnosis not present

## 2022-06-18 HISTORY — DX: Polyneuropathy, unspecified: G62.9

## 2022-06-18 HISTORY — DX: Other specified soft tissue disorders: M79.89

## 2022-06-18 MED ORDER — GABAPENTIN 300 MG PO CAPS: 300.0000 mg | ORAL_CAPSULE | Freq: Three times a day (TID) | ORAL | 3 refills | Status: AC

## 2022-06-18 MED ORDER — FUROSEMIDE 20 MG PO TABS: 20.0000 mg | ORAL_TABLET | Freq: Two times a day (BID) | ORAL | 2 refills | Status: DC | PRN

## 2022-06-18 MED ORDER — B-12 1000 MCG PO CAPS: 1.0000 | ORAL_CAPSULE | Freq: Every day | ORAL | 3 refills | Status: AC

## 2022-06-18 NOTE — Assessment & Plan Note (Signed)
She reports she is taking the 100 and she is still having a lot of very bothersome numbing sensation in her right leg and so she is agreeable to go up on the dose of this so I am increasing it to 300 times a day as needed for numbing numbing sensation

## 2022-06-18 NOTE — Assessment & Plan Note (Signed)
Furosemide added today for uncontrolled blood pressure and leg swelling She reports high blood pressure at home

## 2022-06-18 NOTE — Assessment & Plan Note (Addendum)
She reports she has some difficulty keeping her legs elevated I advised her to keep trying and also using compression stockings is recommended but generally hopeless.  The best treatments by far are provided by vein specialists She  declines that for now and request a fluid pill only to which I agree Feels fine no systemic signs and symptoms such as fever, myalgias, malaise, fatigue, chills and no cramps/pain in calf so labwork felt optional and lab was closing and its long drive for her to return so not ordered

## 2022-06-18 NOTE — Progress Notes (Signed)
[facility]: [id number]   Routine Medical Office Visit  Patient:  Anita Fletcher      Age: 77 y.o.       Sex:  female  Date:   06/18/2022  PCP:    Loralee Pacas, [place] Provider: Loralee Pacas, MD   Assessment and Plan:    Desirai was seen today for right leg, foot and hand swelling/pain.  Leg swelling Overview: Ms.Trenkamp noticed a few weeks ago. Patient complains of numbness sensation without loss of sensation I closely evaluated her legs and she has bilateral mild pitting edema 2+ with increased sensation of edema on the right leg but I cannot really tell the difference.  There is no tenderness in her calves there is excellent circulation and pulse on bilateral dorsal pedis and posterior tibialis, excellent capillary refill and excellent color to the feet.  Overall it is most suspicious for chronic venous insufficiency but heart and liver could potentially contribute.  Assessment & Plan: She reports she has some difficulty keeping her legs elevated I advised her to keep trying and also using compression stockings is recommended but generally hopeless.  The best treatments by far are provided by vein specialists She  declines that for now and request a fluid pill only to which I agree Feels fine no systemic signs and symptoms such as fever, myalgias, malaise, fatigue, chills and no cramps/pain in calf so labwork felt optional and lab was closing and its long drive for her to return so not ordered  Orders: -     Furosemide; Take 1 tablet (20 mg total) by mouth 2 (two) times daily as needed for fluid or edema (take 1-2 tablets daily am only if there is leg swelling).  Dispense: 60 tablet; Refill: 2  Hypertension, unspecified type Overview: Current hypertension medications:       Sig   amLODipine (NORVASC) 5 MG tablet (Taking) Take 1 tablet (5 mg total) by mouth daily.   furosemide (LASIX) 20 MG tablet (Taking) Take 1 tablet (20 mg total)  by mouth 2 (two) times daily as needed for fluid or edema (take 1-2 tablets daily am only if there is leg swelling).   losartan (COZAAR) 100 MG tablet (Taking) Take 1 tablet (100 mg total) by mouth daily.   metoprolol tartrate (LOPRESSOR) 25 MG tablet (Taking) Take 0.5 tablets (12.5 mg total) by mouth daily.        Assessment & Plan: Furosemide added today for uncontrolled blood pressure and leg swelling She reports high blood pressure at home   Neuropathy Overview: Sensation of numbness R leg only First noticed with swelling 05/2022, seemed disconnected to her CVA October to November. 2023    Assessment & Plan: She reports she is taking the 100 and she is still having a lot of very bothersome numbing sensation in her right leg and so she is agreeable to go up on the dose of this so I am increasing it to 300 times a day as needed for numbing numbing sensation  Orders: -     Gabapentin; Take 1 capsule (300 mg total) by mouth 3 (three) times daily.  Dispense: 90 capsule; Refill: 3 -     B-12; Take 1 each by mouth daily at 6 (six) AM.  Dispense: 90 capsule; Refill: 3  Colon cancer screening -     Cologuard  Driving safety issue Overview: Post cerebrovascular accident doesn't trust her legs but can walk and recovered well  Assessment & Plan: Advised patient to call 211 to get rides, she said she will get rides from boyfriend         Clinical Presentation:   The patient is a 77 y.o. female Right leg, foot and hand swelling/pain (For a few weeks, unable to drive currently due to this.)   77 y.o. female Right leg, foot and hand swelling/pain (For a few weeks, unable to drive currently due to this.)    has History of CVA with residual deficit; Paroxysmal atrial fibrillation ([place]); Hypertension; Intracranial atherosclerosis; Hypokalemia; Insomnia; Constipation; Hypertensive retinopathy; Neuropathic pain; Mixed hyperlipidemia; Financial insecurity due to medical expenses; At  high risk for injury related to fall; Leg swelling; Neuropathy; and Driving safety issue on their problem list.  has a past medical history of Acute CVA (cerebrovascular accident) ([place]) (01/27/2022), HTN (hypertension) (01/27/2022), Leg swelling (06/18/2022), Neuropathic pain (04/03/2022), Neuropathy (06/18/2022), PAF (paroxysmal atrial fibrillation) ([place]), and TIA (transient ischemic attack).  Current Outpatient Medications:    amLODipine (NORVASC) 5 MG tablet, Take 1 tablet (5 mg total) by mouth daily., Disp: 90 tablet, Rfl: 3   apixaban (ELIQUIS) 5 MG TABS tablet, Take 1 tablet (5 mg total) by mouth 2 (two) times daily., Disp: 180 tablet, Rfl: 3   atorvastatin (LIPITOR) 40 MG tablet, Take 1 tablet (40 mg total) by mouth daily., Disp: 90 tablet, Rfl: 3   Cyanocobalamin (B-12) 1000 MCG CAPS, Take 1 each by mouth daily at 6 (six) AM., Disp: 90 capsule, Rfl: 3   diclofenac Sodium (VOLTAREN) 1 % GEL, Apply 2 g topically 4 (four) times daily., Disp: 350 g, Rfl: 0   fluticasone (FLONASE) 50 MCG/ACT nasal spray, Use 1 spray in each nostril once daily., Disp: 16 g, Rfl: 0   furosemide (LASIX) 20 MG tablet, Take 1 tablet (20 mg total) by mouth 2 (two) times daily as needed for fluid or edema (take 1-2 tablets daily am only if there is leg swelling)., Disp: 60 tablet, Rfl: 2   gabapentin (NEURONTIN) 300 MG capsule, Take 1 capsule (300 mg total) by mouth 3 (three) times daily., Disp: 90 capsule, Rfl: 3   loratadine (CLARITIN) 10 MG tablet, Take 1 tablet (10 mg total) by mouth daily., Disp: 90 tablet, Rfl: 3   losartan (COZAAR) 100 MG tablet, Take 1 tablet (100 mg total) by mouth daily., Disp: 90 tablet, Rfl: 3   melatonin 5 MG TABS, Take 1 tablet (5 mg total) by mouth at bedtime., Disp: 90 tablet, Rfl: 3   metoprolol tartrate (LOPRESSOR) 25 MG tablet, Take 0.5 tablets (12.5 mg total) by mouth daily., Disp: 45 tablet, Rfl: 3   potassium chloride (KLOR-CON M) 10 MEQ tablet, Take 1 tablet (10 mEq total) by mouth  daily., Disp: 90 tablet, Rfl: 3   senna-docusate (SENOKOT-S) 8.6-50 MG tablet, Take 1 tablet by mouth 2 (two) times daily., Disp: 180 tablet, Rfl: 3   ticagrelor (BRILINTA) 90 MG TABS tablet, Take 1 tablet (90 mg total) by mouth 2 (two) times daily., Disp: 180 tablet, Rfl: 3  Active Ambulatory Problems    Diagnosis Date Noted   History of CVA with residual deficit 01/27/2022   Paroxysmal atrial fibrillation ([place]) 01/27/2022   Hypertension 01/27/2022   Intracranial atherosclerosis 01/31/2022   Hypokalemia 02/14/2022   Insomnia 02/14/2022   Constipation 02/14/2022   Hypertensive retinopathy 02/28/2022   Neuropathic pain 04/03/2022   Mixed hyperlipidemia 04/17/2022   Financial insecurity due to medical expenses 04/17/2022   At high risk for injury related to fall  04/17/2022   Leg swelling 06/18/2022   Neuropathy 06/18/2022   Driving safety issue 06/18/2022   Resolved Ambulatory Problems    Diagnosis Date Noted   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   apixaban (ELIQUIS) 5 MG TABS tablet Take 1 tablet (5 mg total) by mouth 2 (two) times daily.   atorvastatin (LIPITOR) 40 MG tablet Take 1 tablet (40 mg total) by mouth daily.   diclofenac Sodium (VOLTAREN) 1 % GEL Apply 2 g topically 4 (four) times daily.   fluticasone (FLONASE) 50 MCG/ACT nasal spray Use 1 spray in each nostril once daily.   loratadine (CLARITIN) 10 MG tablet Take 1 tablet (10 mg total) by mouth daily.   losartan (COZAAR) 100 MG tablet Take 1 tablet (100 mg total) by mouth daily.   melatonin 5 MG TABS Take 1 tablet (5 mg total) by  mouth at bedtime.   metoprolol tartrate (LOPRESSOR) 25 MG tablet Take 0.5 tablets (12.5 mg total) by mouth daily.   potassium chloride (KLOR-CON M) 10 MEQ tablet Take 1 tablet (10 mEq total) by mouth daily.   senna-docusate (SENOKOT-S) 8.6-50 MG tablet Take 1 tablet by mouth 2 (two) times daily.   ticagrelor (BRILINTA) 90 MG TABS tablet Take 1 tablet (90 mg total) by mouth 2 (two) times daily.   [DISCONTINUED] gabapentin (NEURONTIN) 100 MG capsule Take 1 capsule (100 mg total) by mouth 3 (three) times daily.   [DISCONTINUED] lidocaine (XYLOCAINE) 5 % ointment Apply 1 Application topically as needed.   No facility-administered medications prior to visit.     HPI           Clinical Data Analysis:  Physical Exam  BP (!) 140/90 (BP Location: Left Arm, Patient Position: Sitting)   Pulse 66   Temp 98.2 F (36.8 C) (Temporal)   Ht 5\' 4"  (1.626 m)   Wt 157 lb 3.2 oz (71.3 kg)   SpO2 97%   BMI 26.98 kg/m  Wt Readings from Last 10 Encounters:  06/18/22 157 lb 3.2 oz (71.3 kg)  05/16/22 159 lb 12.8 oz (72.5 kg)  05/09/22 154 lb (69.9 kg)  04/17/22 158 lb (71.7 kg)  04/16/22 157 lb (71.2 kg)  04/03/22 157 lb (71.2 kg)  03/11/22 156 lb (70.8 kg)  02/28/22 156 lb 3.2 oz (70.9 kg)  02/26/22 156 lb 6.4 oz (70.9 kg)  02/04/22 158 lb 1.1 oz (71.7 kg)   Vital signs reviewed.  Nursing notes reviewed. Weight trend reviewed. Abnormalities and Problem-Specific physical exam findings: moderate swelling legs with good pulses, good color, good capillary refill duration.  Walking is good but doesn't swing arm General Appearance:  Well developed, well nourished    female with Body mass index is 26.98 kg/m. in no acute distress.   Pulmonary:  Normal work of breathing at rest, no respiratory distress apparent. SpO2: 97 %  Musculoskeletal: All extremities are intact.  Neurological:  Awake, alert. No obvious focal neurological deficits (truly difficult to discern prior cerebrovascular accident residual  deficits or cognitive impairments).  Sensorium seems unclouded. Psychiatric:  Appropriate mood, pleasant demeanor   Results Reviewed: (reviewed  labs/imaging may be also be found in the assessment / plan section):     Latest Reference Range & Units 03/11/22 10:18  COMPREHENSIVE METABOLIC PANEL  Rpt !  Sodium 135 - 145 mmol/L 144  Potassium 3.5 - 5.1 mmol/L 3.4 (L)  Chloride 98 - 111 mmol/L 113 (H)  CO2 22 - 32 mmol/L 22  Glucose 70 - 99 mg/dL 94  BUN 8 - 23 mg/dL 10  Creatinine 0.44 - 1.00 mg/dL 0.76  Calcium 8.9 - 10.3 mg/dL 8.8 (L)  Anion gap 5 - 15  9  Alkaline Phosphatase 38 - 126 U/L 64  Albumin 3.5 - 5.0 g/dL 3.5  AST 15 - 41 U/L 20  ALT 0 - 44 U/L 21  Total Protein 6.5 - 8.1 g/dL 6.2 (L)  Total Bilirubin 0.3 - 1.2 mg/dL 0.7  GFR, Estimated >60 mL/min >60  WBC 4.0 - 10.5 K/uL 7.5  RBC 3.87 - 5.11 MIL/uL 4.27  Hemoglobin 12.0 - 15.0 g/dL 13.3  HCT 36.0 - 46.0 % 40.3  MCV 80.0 - 100.0 fL 94.4  MCH 26.0 - 34.0 pg 31.1  MCHC 30.0 - 36.0 g/dL 33.0  RDW 11.5 - 15.5 % 13.2  Platelets 150 - 400 K/uL 264  nRBC 0.0 - 0.2 % 0.0  Neutrophils % 76  Lymphocytes % 15  Monocytes Relative % 7  Eosinophil % 2  Basophil % 0  Immature Granulocytes % 0  NEUT# 1.7 - 7.7 K/uL 5.7  Lymphocyte # 0.7 - 4.0 K/uL 1.1  Monocyte # 0.1 - 1.0 K/uL 0.5  Eosinophils Absolute 0.0 - 0.5 K/uL 0.1  Basophils Absolute 0.0 - 0.1 K/uL 0.0  Abs Immature Granulocytes 0.00 - 0.07 K/uL 0.03  !: Data is abnormal (L): Data is abnormally low (H): Data is abnormally high Rpt: View report in Results Review for more information     Signed: Loralee Pacas, MD 06/18/2022 7:10 PM

## 2022-06-18 NOTE — Assessment & Plan Note (Signed)
Advised patient to call 211 to get rides, she said she will get rides from boyfriend

## 2022-07-09 ENCOUNTER — Encounter: Payer: Self-pay | Admitting: Internal Medicine

## 2022-07-09 ENCOUNTER — Ambulatory Visit (INDEPENDENT_AMBULATORY_CARE_PROVIDER_SITE_OTHER): Payer: Medicare HMO | Admitting: Internal Medicine

## 2022-07-09 ENCOUNTER — Ambulatory Visit: Payer: Medicare HMO | Admitting: Internal Medicine

## 2022-07-09 VITALS — BP 150/88 | HR 64 | Temp 98.2°F | Ht 64.0 in | Wt 157.0 lb

## 2022-07-09 DIAGNOSIS — I10 Essential (primary) hypertension: Secondary | ICD-10-CM | POA: Diagnosis not present

## 2022-07-09 DIAGNOSIS — M79601 Pain in right arm: Secondary | ICD-10-CM | POA: Diagnosis not present

## 2022-07-09 DIAGNOSIS — H9190 Unspecified hearing loss, unspecified ear: Secondary | ICD-10-CM | POA: Diagnosis not present

## 2022-07-09 DIAGNOSIS — M7989 Other specified soft tissue disorders: Secondary | ICD-10-CM | POA: Diagnosis not present

## 2022-07-09 DIAGNOSIS — I639 Cerebral infarction, unspecified: Secondary | ICD-10-CM

## 2022-07-09 DIAGNOSIS — G729 Myopathy, unspecified: Secondary | ICD-10-CM | POA: Diagnosis not present

## 2022-07-09 DIAGNOSIS — M79603 Pain in arm, unspecified: Secondary | ICD-10-CM

## 2022-07-09 DIAGNOSIS — Z789 Other specified health status: Secondary | ICD-10-CM

## 2022-07-09 HISTORY — DX: Pain in arm, unspecified: M79.603

## 2022-07-09 MED ORDER — METHOCARBAMOL 500 MG PO TABS: 500.0000 mg | ORAL_TABLET | Freq: Four times a day (QID) | ORAL | 2 refills | Status: DC

## 2022-07-09 MED ORDER — ROSUVASTATIN CALCIUM 20 MG PO TABS: 20.0000 mg | ORAL_TABLET | Freq: Every day | ORAL | 3 refills | Status: DC

## 2022-07-09 MED ORDER — LIDOCAINE 5 % EX PTCH: 3.0000 | MEDICATED_PATCH | CUTANEOUS | 5 refills | Status: DC

## 2022-07-09 MED ORDER — TRAMADOL HCL 50 MG PO TABS: 50.0000 mg | ORAL_TABLET | Freq: Three times a day (TID) | ORAL | 0 refills | Status: AC | PRN

## 2022-07-09 NOTE — Progress Notes (Signed)
[name]: [phone]   Routine Medical Office Visit  Patient:  Anita Fletcher      Age: 77 y.o.       Sex:  female  Date:   07/09/2022 PCP:    Lula Olszewski, MD   Today's Healthcare Provider: Lula Olszewski, MD   Assessment and Plan:   Hypertension, unspecified type Assessment & Plan: Although blood pressure has been poorly controlled she has had some other issues going on that might explain I offered to go up on the medicine and she would prefer just to see if switching to nighttime with the losartan might work better so I am okay with that now just have her come back in a couple weeks since she is having a number of new issues that I need to get to the bottom of anyway I will recheck the blood pressure then and see if her overnight strategy works.  I asked her to consider letting me increase the medications if home blood pressures do not show improvement.  My goal for her is 140/90 and explained  Orders: -     CK -     Comprehensive metabolic panel -     Magnesium -     D-dimer, quantitative -     Brain natriuretic peptide; Future  Leg swelling Assessment & Plan: The pain in the leg that is associated with this is about the same. Still poorly controlled  Gabapentin helps a little I still cannot appreciate any swelling or tenderness but I am concerned because it seems like she got this new problem in her arm it is not too dissimilar I am wondering if something is myopathic going on as she thinks it is like a muscle pain.. I'm committing to the statin theory and adding statin intolerance to chart  With get some blood work to look into it a little more I do not think an x-ray is going to pay off.  Also eliquis for cerebrovascular accident and lack of calf tenderness makes deep vein thrombosis highly unlikely   Pain of right upper extremity Assessment & Plan: Based on the time course of symptoms and the tenderness my best guess is that she has some  muscle spasming causing some myositis in the right arm and shoulder which is likely due to new atorvastatin recently started for cerebrovascular accident- still might be electrolyte abnormality  induced by the fluid pill I gave her for the leg swelling. We're going to recheck the electrolyte levels and check the kidney function and the CK level to make sure there is not too significant of muscle breakdown I encouraged her not to take the fluid pill and to hydrate with an electrolyte containing solution like Gatorade or liquid IV until we can get the results of the lab work we will also I offered pain medication  Strongly suspect(s) due to the statin she recently started for cerebrovascular accident - will try to get Repatha if she fails rosuvastatin trial after brief atorvastatin vacation Wrote pain medications until it calms down  Orders: -     Lidocaine; Place 3 patches onto the skin daily. Remove & Discard patch within 12 hours or as directed by MD  Dispense: 90 patch; Refill: 5 -     Methocarbamol; Take 1 tablet (500 mg total) by mouth 4 (four) times daily. Take for muscle pain and spasm  Dispense: 60 tablet; Refill: 2  Statin intolerance  Myopathy -     traMADol  HCl; Take 1 tablet (50 mg total) by mouth every 8 (eight) hours as needed for up to 5 days.  Dispense: 15 tablet; Refill: 0  Cerebrovascular accident (CVA), unspecified mechanism -     Rosuvastatin Calcium; Take 1 tablet (20 mg total) by mouth daily. Replaces atorvastatin.  Take a break for 1 week before starting to let your muscle injury from atorvastatin heal.  Dispense: 90 tablet; Refill: 3  Hearing loss, unspecified hearing loss type, unspecified laterality Assessment & Plan: She brought this up today but we didn't discuss due to other acute issues and suspicious for late cerebrovascular accident effect Referring care to audiology for further evaluation and management   Orders: -     Ambulatory referral to Audiology        Clinical Presentation:   77 y.o. female here today for 3 week follow-up  Reviewed:  has a past medical history of Acute CVA (cerebrovascular accident) (01/27/2022), Arm pain (07/09/2022), Hearing loss (07/10/2022), HTN (hypertension) (01/27/2022), Leg swelling (06/18/2022), Neuropathic pain (04/03/2022), Neuropathy (06/18/2022), PAF (paroxysmal atrial fibrillation), and TIA (transient ischemic attack). 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   Hypokalemia 02/14/2022   Insomnia 02/14/2022   Constipation 02/14/2022   Hypertensive retinopathy 02/28/2022   Neuropathic pain 04/03/2022   Mixed hyperlipidemia 04/17/2022   Financial insecurity due to medical expenses 04/17/2022   At high risk for injury related to fall 04/17/2022   Leg swelling 06/18/2022   Neuropathy 06/18/2022   Driving safety issue 99/83/3825   Arm pain 07/09/2022   Hearing loss 07/10/2022   Resolved Ambulatory Problems    Diagnosis Date Noted   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   apixaban (ELIQUIS) 5 MG TABS tablet Take 1 tablet (5 mg total) by mouth 2 (two) times daily.   atorvastatin (LIPITOR) 40 MG tablet Take 1 tablet (40 mg total) by mouth daily.   Cyanocobalamin (B-12) 1000 MCG CAPS Take 1 each by mouth daily at 6 (six) AM.   diclofenac Sodium (VOLTAREN) 1 % GEL Apply 2 g topically 4 (four) times daily.   fluticasone (FLONASE) 50 MCG/ACT nasal spray Use 1 spray in  each nostril once daily.   furosemide (LASIX) 20 MG tablet Take 1 tablet (20 mg total) by mouth 2 (two) times daily as needed for fluid or edema (take 1-2 tablets daily am only if there is leg swelling).   gabapentin (NEURONTIN) 300 MG capsule Take 1 capsule (300 mg total) by mouth 3 (three) times daily.   loratadine (CLARITIN) 10 MG tablet Take 1 tablet (10 mg total) by mouth daily.   losartan (COZAAR) 100 MG tablet Take 1 tablet (100 mg total) by mouth daily.   melatonin 5 MG TABS Take 1 tablet (5 mg total) by mouth at bedtime.   metoprolol tartrate (LOPRESSOR) 25 MG tablet Take 0.5 tablets (12.5 mg total) by mouth daily.   potassium chloride (KLOR-CON M) 10 MEQ tablet Take 1 tablet (10 mEq total) by mouth daily.  senna-docusate (SENOKOT-S) 8.6-50 MG tablet Take 1 tablet by mouth 2 (two) times daily.   ticagrelor (BRILINTA) 90 MG TABS tablet Take 1 tablet (90 mg total) by mouth 2 (two) times daily.   No facility-administered medications prior to visit.    HPI  Updated and modified:  Problem  Hearing Loss   Started to be problematic a couple months after cerebrovascular accident   Arm Pain   New problem first described 07/09/2022  as chief complaint when presenting for a follow up for leg pain 3 weeks priror Having numbing sensation in hand and numbing pain in R arm and hurts to move.  Feels like humerus is broke midshaft but there was no injury Started around mid March woke her up feels like broken arm in the right midshaft humerus feels like muscle pain feels like tightness in the shoulder feels like numbness in the hand Is not clear whether moving the arm aggravates the pain she says it does not hurt more but it does not help it She has not been able to identify anything that relieves the pain or triggers the pain The pain is like a broken bone sensation and a tightness but not really any numbness tingling or burning or Sheath she thinks she may have noticed it when she went to pick  something up she denies any significant injury or trauma associated Modest tenderness throughout the upper arm and shoulder but not on the forearm there is no mass palpable   New problem first described 07/09/2022  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  Hasn't been able to to hear anything the left ear since she went in the hospital   Leg Swelling   Interim history:  leg swelling/pain not changed much. This is main reason we scheduled this follow up She has not been elevating legs but she did try the fluid pill it did not work she is planning on trying a under desk elliptical for at work to keep the circulation improving. Here for follow up for leg swellilng and R leg neuropathy that we tried gabapentin for. Taking gabapentin makes her sleep but helps discomfort and allows sleep.  Prior history: First noticed February 2024 after stroke . Associated with starting atorvastatin. Patient complains of numbness sensation without loss of sensation Associated with bilateral mild pitting edema 2+ with increased sensation of edema on the right leg b   There is no tenderness in her calves there is excellent circulation and pulse on bilateral dorsal pedis and posterior tibialis, excellent capillary refill and excellent color to the feet.   Overall it is most suspicious for chronic venous insufficiency but heart and liver could potentially contribute.   Hypertension    She also wants to know if she can switch her medicine and take it at night for blood pressure before she goes to bed because she read on the Internet that is better to do it at night than in the morning  Current hypertension medications:       Sig   amLODipine (NORVASC) 5 MG tablet (Taking) Take 1 tablet (5 mg total) by mouth daily.   furosemide (LASIX) 20 MG tablet (Taking) Take 1 tablet (20 mg total) by mouth 2 (two) times daily as needed for fluid or edema (take 1-2 tablets daily am only if there is leg swelling).   losartan (COZAAR) 100 MG tablet  (Taking) Take 1 tablet (100 mg total) by mouth daily.   metoprolol tartrate (LOPRESSOR) 25 MG tablet (Taking) Take 0.5 tablets (12.5  mg total) by mouth daily.       Patient reports compliance with current medications and no significant side effect(s)  Home readings:running 140's at home  BP Readings from Last 3 Encounters:  07/09/22 (!) 150/88  06/18/22 (!) 140/90  05/16/22 (!) 140/80                    Clinical Data Analysis:   Physical Exam  BP (!) 150/88 (BP Location: Left Arm, Patient Position: Sitting)   Pulse 64   Temp 98.2 F (36.8 C) (Temporal)   Ht 5\' 4"  (1.626 m)   Wt 157 lb (71.2 kg)   SpO2 97%   BMI 26.95 kg/m  Wt Readings from Last 10 Encounters:  07/09/22 157 lb (71.2 kg)  06/18/22 157 lb 3.2 oz (71.3 kg)  05/16/22 159 lb 12.8 oz (72.5 kg)  05/09/22 154 lb (69.9 kg)  04/17/22 158 lb (71.7 kg)  04/16/22 157 lb (71.2 kg)  04/03/22 157 lb (71.2 kg)  03/11/22 156 lb (70.8 kg)  02/28/22 156 lb 3.2 oz (70.9 kg)  02/26/22 156 lb 6.4 oz (70.9 kg)   Vital signs reviewed.  Nursing notes reviewed. Weight trend reviewed. Abnormalities and Problem-Specific physical exam findings:  muscle tenderness R arm from elbow to shoulder and bilateral lower extremity from knee down. Hearing not noticeably diminished but she reports difficulty hearing if my head is turned away. General Appearance:  No acute distress appreciable.   Well-groomed, healthy-appearing female.  Well proportioned with no abnormal fat distribution.  Good muscle tone. Skin: Clear and well-hydrated. Pulmonary:  Normal work of breathing at rest, no respiratory distress apparent. SpO2: 97 %  Musculoskeletal: Patient demonstrates smooth and coordinated movements throughout all major joints.All extremities are intact.  Neurological:  Awake, alert, oriented, and engaged.  No obvious focal neurological deficits or cognitive impairments.  Sensorium seems unclouded.Speech is clear and coherent with logical  content. Psychiatric:  Appropriate mood, pleasant and cooperative demeanor, cheerful and engaged during the exam    Additional Results Reviewed:     Results for orders placed or performed in visit on 07/09/22  CK (Creatine Kinase)  Result Value Ref Range   Total CK 104 7 - 177 U/L  Comp Met (CMET)  Result Value Ref Range   Sodium 142 135 - 145 mEq/L   Potassium 3.5 3.5 - 5.1 mEq/L   Chloride 105 96 - 112 mEq/L   CO2 27 19 - 32 mEq/L   Glucose, Bld 86 70 - 99 mg/dL   BUN 12 6 - 23 mg/dL   Creatinine, Ser 6.57 0.40 - 1.20 mg/dL   Total Bilirubin 0.5 0.2 - 1.2 mg/dL   Alkaline Phosphatase 65 39 - 117 U/L   AST 17 0 - 37 U/L   ALT 16 0 - 35 U/L   Total Protein 6.7 6.0 - 8.3 g/dL   Albumin 4.1 3.5 - 5.2 g/dL   GFR 84.69 >62.95 mL/min   Calcium 9.2 8.4 - 10.5 mg/dL  Magnesium  Result Value Ref Range   Magnesium 2.0 1.5 - 2.5 mg/dL  D-Dimer, Quantitative  Result Value Ref Range   D-Dimer, Quant 0.32 <0.50 mcg/mL FEU    Recent Results (from the past 2160 hour(s))  CK (Creatine Kinase)     Status: None   Collection Time: 07/09/22  4:52 PM  Result Value Ref Range   Total CK 104 7 - 177 U/L  Comp Met (CMET)     Status: None   Collection  Time: 07/09/22  4:52 PM  Result Value Ref Range   Sodium 142 135 - 145 mEq/L   Potassium 3.5 3.5 - 5.1 mEq/L   Chloride 105 96 - 112 mEq/L   CO2 27 19 - 32 mEq/L   Glucose, Bld 86 70 - 99 mg/dL   BUN 12 6 - 23 mg/dL   Creatinine, Ser 4.540.81 0.40 - 1.20 mg/dL   Total Bilirubin 0.5 0.2 - 1.2 mg/dL   Alkaline Phosphatase 65 39 - 117 U/L   AST 17 0 - 37 U/L   ALT 16 0 - 35 U/L   Total Protein 6.7 6.0 - 8.3 g/dL   Albumin 4.1 3.5 - 5.2 g/dL   GFR 09.8171.14 >19.14>60.00 mL/min    Comment: Calculated using the CKD-EPI Creatinine Equation (2021)   Calcium 9.2 8.4 - 10.5 mg/dL  Magnesium     Status: None   Collection Time: 07/09/22  4:52 PM  Result Value Ref Range   Magnesium 2.0 1.5 - 2.5 mg/dL  D-Dimer, Quantitative     Status: None   Collection  Time: 07/09/22  4:52 PM  Result Value Ref Range   D-Dimer, Quant 0.32 <0.50 mcg/mL FEU    Comment: . The D-Dimer test is used frequently to exclude an acute PE or DVT. In patients with a low to moderate clinical risk assessment and a D-Dimer result <0.50 mcg/mL FEU, the likelihood of a PE or DVT is very low. However, a thromboembolic event should not be excluded solely on the basis of the D-Dimer level. Increased levels of D-Dimer are associated with a PE, DVT, DIC, malignancies, inflammation, sepsis, surgery, trauma, pregnancy, and advancing patient age. [Jama 2006 11:295(2):199-207] . For additional information, please refer to: http://education.quest[employer].com/faq/FAQ149 (This link is being provided for informational/ educational purposes only) .     No image results found.   No results found.   --------------------------------    Signed: Lula Olszewskiyan G Brown Dunlap, MD 07/10/2022 4:31 PM

## 2022-07-09 NOTE — Assessment & Plan Note (Signed)
Based on the time course of symptoms and the tenderness my best guess is that she has some muscle spasming causing some myositis in the right arm and shoulder which is likely due to new atorvastatin recently started for cerebrovascular accident- still might be electrolyte abnormality  induced by the fluid pill I gave her for the leg swelling. We're going to recheck the electrolyte levels and check the kidney function and the CK level to make sure there is not too significant of muscle breakdown I encouraged her not to take the fluid pill and to hydrate with an electrolyte containing solution like Gatorade or liquid IV until we can get the results of the lab work we will also I offered pain medication  Strongly suspect(s) due to the statin she recently started for cerebrovascular accident - will try to get Repatha if she fails rosuvastatin trial after brief atorvastatin vacation Wrote pain medications until it calms down

## 2022-07-09 NOTE — Assessment & Plan Note (Addendum)
The pain in the leg that is associated with this is about the same. Still poorly controlled  Gabapentin helps a little I still cannot appreciate any swelling or tenderness but I am concerned because it seems like she got this new problem in her arm it is not too dissimilar I am wondering if something is myopathic going on as she thinks it is like a muscle pain.. I'm committing to the statin theory and adding statin intolerance to chart  With get some blood work to look into it a little more I do not think an x-ray is going to pay off.  Also eliquis for cerebrovascular accident and lack of calf tenderness makes deep vein thrombosis highly unlikely

## 2022-07-09 NOTE — Assessment & Plan Note (Signed)
Although blood pressure has been poorly controlled she has had some other issues going on that might explain I offered to go up on the medicine and she would prefer just to see if switching to nighttime with the losartan might work better so I am okay with that now just have her come back in a couple weeks since she is having a number of new issues that I need to get to the bottom of anyway I will recheck the blood pressure then and see if her overnight strategy works.  I asked her to consider letting me increase the medications if home blood pressures do not show improvement.  My goal for her is 140/90 and explained

## 2022-07-10 ENCOUNTER — Encounter: Payer: Self-pay | Admitting: Internal Medicine

## 2022-07-10 DIAGNOSIS — H9190 Unspecified hearing loss, unspecified ear: Secondary | ICD-10-CM | POA: Insufficient documentation

## 2022-07-10 HISTORY — DX: Unspecified hearing loss, unspecified ear: H91.90

## 2022-07-10 LAB — CK: Total CK: 104 U/L (ref 7–177)

## 2022-07-10 LAB — COMPREHENSIVE METABOLIC PANEL
ALT: 16 U/L (ref 0–35)
AST: 17 U/L (ref 0–37)
Albumin: 4.1 g/dL (ref 3.5–5.2)
Alkaline Phosphatase: 65 U/L (ref 39–117)
BUN: 12 mg/dL (ref 6–23)
CO2: 27 mEq/L (ref 19–32)
Calcium: 9.2 mg/dL (ref 8.4–10.5)
Chloride: 105 mEq/L (ref 96–112)
Creatinine, Ser: 0.81 mg/dL (ref 0.40–1.20)
GFR: 71.14 mL/min (ref >60.00)
Glucose, Bld: 86 mg/dL (ref 70–99)
Potassium: 3.5 mEq/L (ref 3.5–5.1)
Sodium: 142 mEq/L (ref 135–145)
Total Bilirubin: 0.5 mg/dL (ref 0.2–1.2)
Total Protein: 6.7 g/dL (ref 6.0–8.3)

## 2022-07-10 LAB — D-DIMER, QUANTITATIVE: D-Dimer, Quant: 0.32 mcg/mL FEU (ref <0.50)

## 2022-07-10 LAB — MAGNESIUM: Magnesium: 2 mg/dL (ref 1.5–2.5)

## 2022-07-10 NOTE — Assessment & Plan Note (Signed)
She brought this up today but we didn't discuss due to other acute issues and suspicious for late cerebrovascular accident effect Referring care to audiology for further evaluation and management

## 2022-07-14 ENCOUNTER — Telehealth: Payer: Self-pay | Admitting: Internal Medicine

## 2022-07-14 NOTE — Telephone Encounter (Signed)
Copied from CRM [id number]. Topic: Medicare AWV >> Jul 14, 2022 12:48 PM Gwenith Spitz wrote: Reason for CRM: Called patient to schedule Medicare Annual Wellness Visit (AWV). Left message for patient to call back and schedule Medicare Annual Wellness Visit (AWV).  Last date of AWV: N/A  Please schedule an appointment at any time with Inetta Fermo, [facility]. Please schedule AWVS with Inetta Fermo, [facility].  If any questions, please contact me at [phone].  Thank you ,  Gabriel Cirri [facility] AWV TEAM Direct Dial [phone]

## 2022-07-17 NOTE — Telephone Encounter (Signed)
Patient called back informing that she is not interested in completing this at this time. States she wants to recover from stroke then callback to schedule.

## 2022-07-19 LAB — COLOGUARD

## 2022-07-21 ENCOUNTER — Ambulatory Visit: Payer: Medicare HMO | Admitting: Audiologist

## 2022-07-23 ENCOUNTER — Encounter: Payer: Self-pay | Admitting: Internal Medicine

## 2022-07-23 ENCOUNTER — Ambulatory Visit (INDEPENDENT_AMBULATORY_CARE_PROVIDER_SITE_OTHER): Payer: Medicare HMO | Admitting: Internal Medicine

## 2022-07-23 VITALS — BP 154/89 | HR 60 | Temp 98.1°F | Ht 64.0 in | Wt 158.0 lb

## 2022-07-23 DIAGNOSIS — M609 Myositis, unspecified: Secondary | ICD-10-CM | POA: Diagnosis not present

## 2022-07-23 DIAGNOSIS — H9190 Unspecified hearing loss, unspecified ear: Secondary | ICD-10-CM

## 2022-07-23 DIAGNOSIS — I480 Paroxysmal atrial fibrillation: Secondary | ICD-10-CM | POA: Diagnosis not present

## 2022-07-23 DIAGNOSIS — M79601 Pain in right arm: Secondary | ICD-10-CM

## 2022-07-23 DIAGNOSIS — I10 Essential (primary) hypertension: Secondary | ICD-10-CM | POA: Diagnosis not present

## 2022-07-23 DIAGNOSIS — J302 Other seasonal allergic rhinitis: Secondary | ICD-10-CM | POA: Diagnosis not present

## 2022-07-23 MED ORDER — AMLODIPINE BESYLATE 5 MG PO TABS: 5.0000 mg | ORAL_TABLET | Freq: Every evening | ORAL | 3 refills | Status: DC

## 2022-07-23 MED ORDER — LORATADINE 10 MG PO TABS: 10.0000 mg | ORAL_TABLET | Freq: Every evening | ORAL | 3 refills | Status: AC

## 2022-07-23 NOTE — Progress Notes (Signed)
[name]: [phone]   Routine Medical Office Visit  Patient:  Anita Fletcher      Age: 77 y.o.       Sex:  female  Date:   07/23/2022 PCP:    Lula Olszewski, MD   Today's Healthcare Provider: Lula Olszewski, MD   Assessment and Plan:   She returns today mainly because prior theory that severe R arm pain was due to statin seems to have been incorrect and the pain has persisted unchanged.  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 However, the localized nature of her arm pain, described as a "broken bone sensation," and the absence of injury suggest the need to explore other differential diagnoses such as rotator cuff pathology, cervical radiculopathy, or even a brachial plexus injury. The numbness and described sensation could also point towards a neuropathic component, potentially exacerbated by her existing conditions or medications. Considering her comprehensive medication list, it's crucial to assess for drug-induced neuropathies or interactions that may contribute to her symptoms. For instance, statins have been associated with myalgias, though her symptoms did not resolve upon  discontinuation, making this less likely. Given the complexity of her presentation, further diagnostic workup should include: MRI of the cervical spine and shoulder: To rule out cervical radiculopathy, rotator cuff tears, or other musculoskeletal abnormalities. Electromyography (EMG) and Nerve Conduction Studies (NCS): To assess for neuropathic pain sources, including brachial plexopathy or peripheral neuropathies. Ultrasound of the shoulder: To evaluate for rotator cuff pathology if MRI is contraindicated. Blood tests for inflammatory markers: Such as CRP or ESR, to rule out inflammatory conditions that could contribute to her symptoms.  He is okay with the blood tests and the ultrasound but wants to hold off and avoid MRI because they have to sedate her for those and for the nerve conduction study she says she wants to wait on that too   Orders: -     C-reactive protein -     Sedimentation rate -     Lyme Disease Serology w/Reflex -     ANA -     UPPER EXTREMITY ARTERIAL DUPLEX UNILAT (BACK OFFICE); Future -     Ambulatory referral to Physical Therapy  Hearing loss, unspecified hearing loss type, unspecified laterality Assessment & Plan: we have audiology referral pending an;D neurology follow up planned We can decide about ENT based on that.   Paroxysmal atrial fibrillation (HCC) -     amLODIPine Besylate; Take 1 tablet (5 mg total) by mouth at bedtime.  Dispense: 90 tablet; Refill: 3  Seasonal allergies -     Loratadine; Take 1 tablet (10 mg total) by mouth at bedtime.  Dispense: 90 tablet; Refill: 3  Myositis of right upper  arm, unspecified myositis type -     C-reactive protein -     Sedimentation rate -     Lyme Disease Serology w/Reflex -     ANA -     UPPER EXTREMITY ARTERIAL DUPLEX UNILAT (BACK OFFICE); Future -     Ambulatory referral to Physical Therapy  Hypertension, unspecified type Assessment & Plan: Reviewed available data from patient and  BP Readings from  Last 3 Encounters:  07/23/22 (!) 154/89  07/09/22 (!) 150/88  06/18/22 (!) 140/90   My individualized, goal average blood pressure for this patient, after considering the evidence for and against aggressive blood pressure goals as well as their past medical history and preferences, is 140/90 In my medical opinion, this problem is stable, inadequately controlled  Expressed concerns about the patient's adherence to their current medication regimen. We explored the reasons for non-adherence and developed strategies to improve it (e.g., pill organizers, medication reminders).  We shifted the medications to at night and plan another close follow up Also, I elected to not increase medication(s) yet due to concern(s) that her severe arm pain is causing transient/temporary elevations in blood pressure   Information for patient review: Please limit and avoid: salt, alcohol, NSAIDS, excess body weight, smoking, stress, sedentary lifestyles The risks of poor control over time are FUTURE stroke and heart attacks, but if you have a blood pressure over 180 and any red flag symptoms(headache/shortness of breath/confusion/chest discomfort) you should go to the ER.  You are encouraged to do home blood pressure monitoring, at least as many times per week as blood pressure medications you are on.  For example, bring a diary with 3 home blood pressure readings per week to each visit if you are on 3 blood pressure medications.   If you are on more than 3 medication(s) or have certain risk factors, we should do a resistant hypertension workup See AFTER VISIT SUMMARY for addition educational information provided Please let me know in advance when you need medication(s) refills, consistently taking your medication is very important!         Clinical Presentation:   77 y.o. female here today for 2 Week Follow-up  HPI   Updated chart data:  Problem  Hearing Loss   Started to be problematic a couple months  after cerebrovascular accident Not clear if related with stroke She reports feeling congested on the right side but with hearing loss more on the left side since early 2024    Arm Pain   New problem first described 07/09/2022  as chief complaint when presenting for a follow up for leg pain 3 weeks priror Having numbing sensation in hand and numbing pain in R arm and hurts to move.  Feels like humerus is broke midshaft but there was no injury Started around mid March woke her up feels like broken arm in the right midshaft humerus feels like muscle pain feels like tightness in the shoulder feels like numbness in the hand Is not clear whether moving the arm aggravates the pain she says it does not hurt more but it does not help it She has not been able to identify anything that relieves the pain or triggers the pain The pain is like a broken bone sensation and a tightness but not really any numbness tingling or burning or [place] she thinks she may have noticed it when she went to pick something up she denies any significant injury or trauma associated Modest tenderness throughout the upper arm and  shoulder but not on the forearm there is no mass palpable 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  Hasn't been able to to hear anything the left ear since she went in the hospital  On 07/09/22 my plan was: Based on the time course of symptoms and the tenderness my best guess is that she has some muscle spasming causing some myositis in the right arm and shoulder which is likely due to new atorvastatin recently started for cerebrovascular accident- still might be electrolyte abnormality  induced by the fluid pill I gave her for the leg swelling. We're going to recheck the electrolyte levels and check the kidney function and the CK level to make sure there is not too significant of muscle breakdown I encouraged her not to take the fluid pill and to hydrate with an electrolyte containing solution like Gatorade or liquid IV  until we can get the results of the lab work we will also I offered pain medication.   Strongly suspect(s) due to the statin she recently started for cerebrovascular accident - will try to get Repatha if she fails rosuvastatin trial after brief atorvastatin vacation.  Wrote pain medications until it calms down  She reports the pain didn't improve with holding rosuvastatin, still hurts very bad in upper humerus / shoulder to raise R arm.  She did medications exactly as asked stopping atorvastatin completely and after not getting any improvement with that for a week starting Crestor at 20 mg but also no worsening with starting.    Hypertension    She also wants to know if she can switch her medicine and take it at night for blood pressure before she goes to bed because she read on the Internet that is better to do it at night than in the morning  Current hypertension medications:       Sig   amLODipine (NORVASC) 5 MG tablet (Taking) Take 1 tablet (5 mg total) by mouth daily.   furosemide (LASIX) 20 MG tablet (Taking) Take 1 tablet (20 mg total) by mouth 2 (two) times daily as needed for fluid or edema (take 1-2 tablets daily am only if there is leg swelling).   losartan (COZAAR) 100 MG tablet (Taking) Take 1 tablet (100 mg total) by mouth daily.   metoprolol tartrate (LOPRESSOR) 25 MG tablet (Taking) Take 0.5 tablets (12.5 mg total) by mouth daily.       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   Hypokalemia 02/14/2022   Insomnia 02/14/2022    Constipation 02/14/2022   Hypertensive retinopathy 02/28/2022   Neuropathic pain 04/03/2022   Mixed hyperlipidemia 04/17/2022   Financial insecurity due to medical expenses 04/17/2022   At high risk for injury related to fall 04/17/2022   Leg swelling 06/18/2022   Neuropathy 06/18/2022   Driving safety issue 16/12/9602   Arm pain 07/09/2022   Hearing loss 07/10/2022   Resolved Ambulatory Problems    Diagnosis Date Noted   Left thalamic infarction (HCC) 02/04/2022   Routine health maintenance 03/26/2012   Past Medical History:  Diagnosis Date  Acute CVA (cerebrovascular accident) (HCC) 01/27/2022   HTN (hypertension) 01/27/2022   PAF (paroxysmal atrial fibrillation) (HCC)    TIA (transient ischemic attack)     Outpatient Medications Prior to Visit  Medication Sig   apixaban (ELIQUIS) 5 MG TABS tablet Take 1 tablet (5 mg total) by mouth 2 (two) times daily.   Cyanocobalamin (B-12) 1000 MCG CAPS Take 1 each by mouth daily at 6 (six) AM.   diclofenac Sodium (VOLTAREN) 1 % GEL Apply 2 g topically 4 (four) times daily.   fluticasone (FLONASE) 50 MCG/ACT nasal spray Use 1 spray in each nostril once daily.   furosemide (LASIX) 20 MG tablet Take 1 tablet (20 mg total) by mouth 2 (two) times daily as needed for fluid or edema (take 1-2 tablets daily am only if there is leg swelling).   gabapentin (NEURONTIN) 300 MG capsule Take 1 capsule (300 mg total) by mouth 3 (three) times daily.   lidocaine (LIDODERM) 5 % Place 3 patches onto the skin daily. Remove & Discard patch within 12 hours or as directed by MD   losartan (COZAAR) 100 MG tablet Take 1 tablet (100 mg total) by mouth daily.   melatonin 5 MG TABS Take 1 tablet (5 mg total) by mouth at bedtime.   methocarbamol (ROBAXIN) 500 MG tablet Take 1 tablet (500 mg total) by mouth 4 (four) times daily. Take for muscle pain and spasm   metoprolol tartrate (LOPRESSOR) 25 MG tablet Take 0.5 tablets (12.5 mg total) by mouth daily.   potassium  chloride (KLOR-CON M) 10 MEQ tablet Take 1 tablet (10 mEq total) by mouth daily.   rosuvastatin (CRESTOR) 20 MG tablet Take 1 tablet (20 mg total) by mouth daily. Replaces atorvastatin.  Take a break for 1 week before starting to let your muscle injury from atorvastatin heal.   senna-docusate (SENOKOT-S) 8.6-50 MG tablet Take 1 tablet by mouth 2 (two) times daily.   ticagrelor (BRILINTA) 90 MG TABS tablet Take 1 tablet (90 mg total) by mouth 2 (two) times daily.   [DISCONTINUED] amLODipine (NORVASC) 5 MG tablet Take 1 tablet (5 mg total) by mouth daily.   [DISCONTINUED] atorvastatin (LIPITOR) 40 MG tablet Take 1 tablet (40 mg total) by mouth daily.   [DISCONTINUED] loratadine (CLARITIN) 10 MG tablet Take 1 tablet (10 mg total) by mouth daily.   No facility-administered medications prior to visit.             Clinical Data Analysis:   Physical Exam  BP (!) 154/89 (BP Location: Left Arm, Patient Position: Sitting)   Pulse 60   Temp 98.1 F (36.7 C) (Temporal)   Ht 5\' 4"  (1.626 m)   Wt 158 lb (71.7 kg)   SpO2 97%   BMI 27.12 kg/m  Wt Readings from Last 10 Encounters:  07/23/22 158 lb (71.7 kg)  07/09/22 157 lb (71.2 kg)  06/18/22 157 lb 3.2 oz (71.3 kg)  05/16/22 159 lb 12.8 oz (72.5 kg)  05/09/22 154 lb (69.9 kg)  04/17/22 158 lb (71.7 kg)  04/16/22 157 lb (71.2 kg)  04/03/22 157 lb (71.2 kg)  03/11/22 156 lb (70.8 kg)  02/28/22 156 lb 3.2 oz (70.9 kg)   Vital signs reviewed.  Nursing notes reviewed. Weight trend reviewed. Abnormalities and Problem-Specific physical exam findings:  pain and tenderness around upper arm and R shoulder, unchanged.  Flares when she raises arm/shoulder beyond 75 degrees General Appearance:  No acute distress appreciable.   Well-groomed, healthy-appearing female.  Well proportioned  with no abnormal fat distribution.  Good muscle tone. Skin: Clear and well-hydrated. Pulmonary:  Normal work of breathing at rest, no respiratory distress apparent.  SpO2: 97 %  Musculoskeletal: All extremities are intact.  Neurological:  Awake, alert, oriented, and engaged.  No obvious focal neurological deficits or cognitive impairments.  Sensorium seems unclouded. Gait is smooth and coordinated.  Speech is clear and coherent with logical content. Psychiatric:  Appropriate mood, pleasant and cooperative demeanor, cheerful and engaged during the exam   Additional Results Reviewed:     No results found for any visits on 07/23/22.  Recent Results (from the past 2160 hour(s))  CK (Creatine Kinase)     Status: None   Collection Time: 07/09/22  4:52 PM  Result Value Ref Range   Total CK 104 7 - 177 U/L  Comp Met (CMET)     Status: None   Collection Time: 07/09/22  4:52 PM  Result Value Ref Range   Sodium 142 135 - 145 mEq/L   Potassium 3.5 3.5 - 5.1 mEq/L   Chloride 105 96 - 112 mEq/L   CO2 27 19 - 32 mEq/L   Glucose, Bld 86 70 - 99 mg/dL   BUN 12 6 - 23 mg/dL   Creatinine, Ser 1.61 0.40 - 1.20 mg/dL   Total Bilirubin 0.5 0.2 - 1.2 mg/dL   Alkaline Phosphatase 65 39 - 117 U/L   AST 17 0 - 37 U/L   ALT 16 0 - 35 U/L   Total Protein 6.7 6.0 - 8.3 g/dL   Albumin 4.1 3.5 - 5.2 g/dL   GFR 09.60 >45.40 mL/min    Comment: Calculated using the CKD-EPI Creatinine Equation (2021)   Calcium 9.2 8.4 - 10.5 mg/dL  Magnesium     Status: None   Collection Time: 07/09/22  4:52 PM  Result Value Ref Range   Magnesium 2.0 1.5 - 2.5 mg/dL  D-Dimer, Quantitative     Status: None   Collection Time: 07/09/22  4:52 PM  Result Value Ref Range   D-Dimer, Quant 0.32 <0.50 mcg/mL FEU    Comment: . The D-Dimer test is used frequently to exclude an acute PE or DVT. In patients with a low to moderate clinical risk assessment and a D-Dimer result <0.50 mcg/mL FEU, the likelihood of a PE or DVT is very low. However, a thromboembolic event should not be excluded solely on the basis of the D-Dimer level. Increased levels of D-Dimer are associated with a PE, DVT, DIC,  malignancies, inflammation, sepsis, surgery, trauma, pregnancy, and advancing patient age. [Jama 2006 11:295(2):199-207] . For additional information, please refer to: http://education.[facility].com/faq/FAQ149 (This link is being provided for informational/ educational purposes only) .   Cologuard     Status: None   Collection Time: 07/19/22  2:01 AM  Result Value Ref Range   COLOGUARD Sample Could Not Be Processed 9 N/A    Comment: An empty collection kit was received in the laboratory. The patient will be contacted to initiate a new sample collection.    No image results found.   No results found.   --------------------------------    Signed: Lula Olszewski, MD 07/25/2022 9:03 AM

## 2022-07-23 NOTE — Assessment & Plan Note (Addendum)
It seems that my previous belief that the pain is due to muscle injury, myositis, or myalgia from statins is incorrect based on her completely stopping statin for a week with no improvement and then restarting them with no worsening of the biceps is where the most pain is and in the shoulder joint when she raises the right arm.  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 We will revisit this option if needed at future visits and  she is always encouraged to contact me if the situation changes or she changes her mind. I offered to go up on the gabapentin but she does not like it is cognitive slowing effects and so she declined  Patients chart review and interview were used to generate a prompt for artificial intelligence analysis ([employer] artificial intelligence) clinical decision support.  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 However, the localized nature of her arm pain, described as a "broken bone sensation," and the absence of injury suggest the need to explore other differential diagnoses such as rotator cuff pathology, cervical radiculopathy, or even a brachial plexus injury. The numbness and described sensation could also point towards a neuropathic component, potentially exacerbated by her existing conditions or medications. Considering her comprehensive medication list, it's  crucial to assess for drug-induced neuropathies or interactions that may contribute to her symptoms. For instance, statins have been associated with myalgias, though her symptoms did not resolve upon discontinuation, making this less likely. Given the complexity of her presentation, further diagnostic workup should include: MRI of the cervical spine and shoulder: To rule out cervical radiculopathy, rotator cuff tears, or other musculoskeletal abnormalities. Electromyography (EMG) and Nerve Conduction Studies (NCS): To assess for neuropathic pain sources, including brachial plexopathy or peripheral neuropathies. Ultrasound of the shoulder: To evaluate for rotator cuff pathology if MRI is contraindicated. Blood tests for inflammatory markers: Such as CRP or ESR, to rule out inflammatory conditions that could contribute to her symptoms.  she is okay with the blood tests and the ultrasound but wants to hold off and avoid MRI because they have to sedate her for those and for the nerve conduction study she says she wants to wait on that too  Following a comprehensive review of the AI-generated report (in red), which included a differential diagnosis, recommended diagnostic tests, treatment options, and follow-up strategies, the plan for care was discussed with Ms.Pike, her well-being as my top priority.  All AI suggestions were presented, and each was meticulously evaluated against current medical guidelines to ensure optimal care.  In accordance with our shared decision-making approach, I ensured Ms.Wynns was fully informed about the AI's recommendations and the rationale behind each intervention, while also integrating my own clinical judgment.  This collaborative approach aimed to strike a balance between the insights from the AI, my medical opinions, and the patient's unique preferences.  After thoughtful consideration of the discussion, she elected to decline certain interventions deemed non-essential  during this encounter. This  decision was reached after a thorough discussion of potential benefits, risks, and alternatives, ensuring Ms.Azizi's informed consent.   All care decisions prioritized Ms.Keats's best interest as the primary consideration, adhering to evidence-based practices while respecting her autonomy.

## 2022-07-23 NOTE — Assessment & Plan Note (Addendum)
we have audiology referral pending an;D neurology follow up planned We can decide about ENT based on that.

## 2022-07-24 ENCOUNTER — Telehealth: Payer: Self-pay | Admitting: Internal Medicine

## 2022-07-24 ENCOUNTER — Other Ambulatory Visit: Payer: Self-pay

## 2022-07-24 DIAGNOSIS — M609 Myositis, unspecified: Secondary | ICD-10-CM

## 2022-07-24 DIAGNOSIS — M79601 Pain in right arm: Secondary | ICD-10-CM

## 2022-07-24 NOTE — Telephone Encounter (Signed)
Patient needs to know when she needs to get labs done

## 2022-07-25 NOTE — Assessment & Plan Note (Signed)
Reviewed available data from patient and  BP Readings from Last 3 Encounters:  07/23/22 (!) 154/89  07/09/22 (!) 150/88  06/18/22 (!) 140/90   My individualized, goal average blood pressure for this patient, after considering the evidence for and against aggressive blood pressure goals as well as their past medical history and preferences, is 140/90 In my medical opinion, this problem is stable, inadequately controlled  Expressed concerns about the patient's adherence to their current medication regimen. We explored the reasons for non-adherence and developed strategies to improve it (e.g., pill organizers, medication reminders).  We shifted the medications to at night and plan another close follow up Also, I elected to not increase medication(s) yet due to concern(s) that her severe arm pain is causing transient/temporary elevations in blood pressure   Information for patient review: Please limit and avoid: salt, alcohol, NSAIDS, excess body weight, smoking, stress, sedentary lifestyles The risks of poor control over time are FUTURE stroke and heart attacks, but if you have a blood pressure over 180 and any red flag symptoms(headache/shortness of breath/confusion/chest discomfort) you should go to the ER.  You are encouraged to do home blood pressure monitoring, at least as many times per week as blood pressure medications you are on.  For example, bring a diary with 3 home blood pressure readings per week to each visit if you are on 3 blood pressure medications.   If you are on more than 3 medication(s) or have certain risk factors, we should do a resistant hypertension workup See AFTER VISIT SUMMARY for addition educational information provided Please let me know in advance when you need medication(s) refills, consistently taking your medication is very important!

## 2022-07-25 NOTE — Patient Instructions (Addendum)
It was a pleasure seeing you today!  Your health and satisfaction are my top priorities. If you believe your experience today was worthy of a 5-star rating, I'd be grateful for your feedback! Lula Olszewski, MD   Next Steps: Schedule Follow-Up:  If any of your medical issues become urgent or worsen, please don't hesitate to reach out or seek emergency room care. In the meantime, I strongly encourage scheduling routine follow up appointments prior to leaving or calling [phone] if you don't have one scheduled yet.  We recommend your next follow-up appointment no later than Return in about 2 weeks (around 08/06/2022) for close follow up acute illness, chronic disease monitoring and management.  Please return sooner if you are not doing well.  Preventive Care:  Don't forget to schedule your annual preventive care visit!  This important checkup is typically covered by insurance and helps identify potential health issues early.  Typically its 100% insurance covered with no co-pay and helps to get surveillance labwork paid for.  Lab & X-ray Appointments:  Scheduled any incomplete lab tests today or call us to schedule.  XRays can be done without an appointment at [facility] at [facility] ([address], [address]), M-F 8:30am-noon or 1pm-5pm.  Just tell them you're there for X-rays ordered by Dr. Jon Billings.  We'll receive the results and contact you by phone or MyChart to discuss next steps.  Medical Information Release:  If you have any relevant medical information we don't have, please sign a release form so we can obtain it for your records.  Bring to Your Next Appointment: Medications: Please bring all your medication bottles to your next appointment to ensure we have an accurate record of your prescriptions. Health Diaries: If you're monitoring any health conditions at home, keeping a diary of your readings can be very helpful for discussions at your next appointment.  Please Review your early  draft clinical notes below and the final encounter summary tomorrow on MyChart after its been completed.   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 However, the localized nature of her arm pain, described as a "broken bone sensation," and the absence of injury suggest the need to explore other differential diagnoses such as rotator cuff pathology, cervical radiculopathy, or even a brachial plexus injury. The numbness and described sensation could also point towards a neuropathic component, potentially exacerbated by her existing conditions or medications. Considering her comprehensive medication list, it's crucial to assess for drug-induced neuropathies or interactions that may contribute to her symptoms. For instance, statins have been associated with myalgias, though her symptoms did not resolve upon discontinuation, making this less likely. Given the complexity of her presentation, further diagnostic workup should include: MRI of the cervical  spine and shoulder: To rule out cervical radiculopathy, rotator cuff tears, or other musculoskeletal abnormalities. Electromyography (EMG) and Nerve Conduction Studies (NCS): To assess for  neuropathic pain sources, including brachial plexopathy or peripheral neuropathies. Ultrasound of the shoulder: To evaluate for rotator cuff pathology if MRI is contraindicated. Blood tests for inflammatory markers: Such as CRP or ESR, to rule out inflammatory conditions that could contribute to her symptoms.  He is okay with the blood tests and the ultrasound but wants to hold off and avoid MRI because they have to sedate her for those and for the nerve conduction study she says she wants to wait on that too   Orders: -     C-reactive protein -     Sedimentation rate -     Lyme Disease Serology w/Reflex -     ANA -     UPPER EXTREMITY ARTERIAL DUPLEX UNILAT (BACK OFFICE); Future -     Ambulatory referral to Physical Therapy  Hearing loss, unspecified hearing loss type, unspecified laterality Assessment & Plan: we have audiology referral pending an;D neurology follow up planned We can decide about ENT based on that.   Paroxysmal atrial fibrillation (HCC) -     amLODIPine Besylate; Take 1 tablet (5 mg total) by mouth at bedtime.  Dispense: 90 tablet; Refill: 3  Seasonal allergies -     Loratadine; Take 1 tablet (10 mg total) by mouth at bedtime.  Dispense: 90 tablet; Refill: 3  Myositis of right upper arm, unspecified myositis type -     C-reactive protein -     Sedimentation rate -     Lyme Disease Serology w/Reflex -     ANA -     UPPER EXTREMITY ARTERIAL DUPLEX UNILAT (BACK OFFICE); Future -     Ambulatory referral to Physical Therapy  Hypertension, unspecified type Assessment & Plan: Reviewed available data from patient and  BP Readings from Last 3 Encounters:  07/23/22 (!) 154/89  07/09/22 (!) 150/88  06/18/22 (!) 140/90   My individualized, goal average blood pressure for this patient, after considering the evidence for and against aggressive blood pressure goals as well as their past medical history and preferences, is 140/90 In my medical opinion, this problem is  stable, inadequately controlled  Expressed concerns about the patient's adherence to their current medication regimen. We explored the reasons for non-adherence and developed strategies to improve it (e.g., pill organizers, medication reminders).  We shifted the medications to at night and plan another close follow up Also, I elected to not increase medication(s) yet due to concern(s) that her severe arm pain is causing transient/temporary elevations in blood pressure   Information for patient review: Please limit and avoid: salt, alcohol, NSAIDS, excess body weight, smoking, stress, sedentary lifestyles The risks of poor control over time are FUTURE stroke and heart attacks, but if you have a blood pressure over 180 and any red flag symptoms(headache/shortness of breath/confusion/chest discomfort) you should go to the ER.  You are encouraged to do home blood pressure monitoring, at least as many times per week as blood pressure medications you are on.  For example, bring a diary with 3 home blood pressure readings per week to each visit if you are on 3 blood pressure medications.   If you are on more than 3 medication(s) or have certain risk factors, we should do a resistant hypertension workup See AFTER VISIT SUMMARY for addition educational information provided Please let me know in advance when you need medication(s)  refills, consistently taking your medication is very important!       Getting Answers and Following Up: Simple Questions & Concerns: For quick questions or basic follow-up after your visit, reach [place] at [phone] or MyChart messaging. Complex Concerns: If your concern is more complex, scheduling an appointment might be best. Discuss this with the staff to find the most suitable option. Lab & Imaging Results: We'll contact you directly if results are abnormal or you don't use MyChart. Most normal results will be on MyChart within 2-3 business days, with a review message from  Dr. Jon Billings. Haven't heard back in 2 weeks? Need results sooner? Contact us at [phone]. Referrals: Our referral coordinator will manage specialist referrals. The specialist's office should contact you within 2 weeks to schedule an appointment. Call us if you haven't heard from them after 2 weeks.  Staying Connected:  MyChart: Activate your MyChart for the fastest way to access results and message [place]. See the last page of this paperwork for instructions.  Billing: X-ray & Lab Orders: These are billed by separate companies. Contact the invoicing company directly for questions or concerns. Visit Charges: Discuss any billing inquiries with our administrative services team.  Feedback & Satisfaction: Share Your Experience: We strive for your satisfaction! If you have any complaints, please let Dr. Jon Billings know directly or contact our Practice Administrators, Edwena Felty or [employer], by asking at the front desk.  Scheduling Tips: Shorter Wait Times: 8 am and 1 pm appointments often have the quickest wait times. Longer Appointments: If you need more time during your visit, talk to the front desk. Due to insurance regulations, multiple back-to-back appointments might be necessary.

## 2022-07-29 ENCOUNTER — Ambulatory Visit: Payer: Medicare HMO | Admitting: Audiologist

## 2022-07-29 NOTE — Telephone Encounter (Signed)
Patient is going to wait to do the labs when she comes in for an OV on 5/10.

## 2022-07-29 NOTE — Telephone Encounter (Signed)
Spoke with patient concerning this. 

## 2022-08-05 ENCOUNTER — Ambulatory Visit: Payer: Medicare HMO | Admitting: Audiologist

## 2022-08-08 ENCOUNTER — Ambulatory Visit: Payer: Medicare HMO | Admitting: Internal Medicine

## 2022-08-11 ENCOUNTER — Ambulatory Visit: Payer: Medicare HMO | Admitting: Physical Therapy

## 2022-08-12 ENCOUNTER — Ambulatory Visit: Payer: Medicare HMO | Admitting: Audiologist

## 2022-08-13 ENCOUNTER — Other Ambulatory Visit (HOSPITAL_COMMUNITY): Payer: Self-pay | Admitting: Neuroradiology

## 2022-08-13 DIAGNOSIS — I651 Occlusion and stenosis of basilar artery: Secondary | ICD-10-CM

## 2022-08-13 DIAGNOSIS — I639 Cerebral infarction, unspecified: Secondary | ICD-10-CM

## 2022-08-13 DIAGNOSIS — I672 Cerebral atherosclerosis: Secondary | ICD-10-CM

## 2022-08-14 ENCOUNTER — Telehealth: Payer: Self-pay | Admitting: Internal Medicine

## 2022-08-14 ENCOUNTER — Telehealth: Payer: Self-pay | Admitting: Pharmacist

## 2022-08-14 DIAGNOSIS — I480 Paroxysmal atrial fibrillation: Secondary | ICD-10-CM

## 2022-08-14 NOTE — Telephone Encounter (Signed)
PharmD reviewed patient chart to assess eligibility for [facility] Care Management [facility] Coordination services. Patient was determined to be a good candidate for the program given the complexity of the medication regimen and overall risk for hospitalization and/or high healthcare utilization.   Referral entered in order to outreach patient and offer appointment with PharmD. Referral cosigned to PCP.   Willa Frater, PharmD Clinical Pharmacist  [facility] [phone]

## 2022-08-14 NOTE — Telephone Encounter (Signed)
Contacted Lucretia Kern Elderkin to schedule their annual wellness visit. Call back at later date: 09/18/2022  Gabriel Cirri [facility] AWV TEAM Direct Dial [phone]

## 2022-08-15 ENCOUNTER — Ambulatory Visit: Payer: Medicare HMO | Admitting: Internal Medicine

## 2022-08-18 ENCOUNTER — Telehealth: Payer: Self-pay | Admitting: Pharmacist

## 2022-08-18 NOTE — Progress Notes (Signed)
Care Management & Coordination Services Pharmacy Team  Reason for Encounter: Appointment Reminder  Contacted patient to confirm {visittype:27222} appointment with ***, PharmD on *** at ***. {[facility] Outreach:28874}  Do you have any problems getting your medications? {yes/no:20286} If yes what types of problems are you experiencing? {Problems:27223}  What is your top health concern you would like to discuss at your upcoming visit?   Have you seen any other providers since your last visit with PCP? {yes/no:20286}   Chart review:  Recent office visits:  07/23/2022 OV (PCP) Lula Olszewski, MD; no medication changes indicated.  07/09/2022 OV (PCP) Lula Olszewski, MD; I encouraged her not to take the fluid pill and to hydrate with an electrolyte containing solution like Gatorade or liquid IV until we can get the results of the lab work.  06/18/2022 OV (PCP) Lula Olszewski, MD;  Furosemide added today for uncontrolled blood pressure and leg swelling She reports high blood pressure at home She reports she is taking the 100 and she is still having a lot of very bothersome numbing sensation in her right leg and so she is agreeable to go up on the dose of this so I am increasing it to 300 times a day as needed for numbing numbing sensation.  04/17/2022 OV (PCP) Lula Olszewski, MD;  no medication changes indicated.  04/03/2022 OV (PCP) Lula Olszewski, MD;  I advised that she increase her gabapentin it even though she rather avoid that because of the drowsiness am sending in a higher dose so that she at least has it available for use if the pain gets too much   02/28/2022 OV (PCP) Lula Olszewski, MD; no medication changes indicated.  Recent consult visits:  05/16/2022 OV (Cardiology) Ronney Asters, NP; Start metoprolol 12.5 mg daily. May take an extra 12.5 mg for sustained palpitations.   05/09/2022 OV (Phys Med) Erick Colace, MD; no medication changes indicated.  04/16/2022 OV  (Neurology) Penumalli, Glenford Bayley, MD; no medication changes indicated.  02/26/2022 OV (Phys Med) Jones Bales, NP;   Hospital visits:  03/11/2022 ED visit for Acute cystitis with hematuria    Star Rating Drugs: *** Medication:  Last Fill: Day Supply   Care Gaps: Annual wellness visit in last year? {yes/no:20286}  If Diabetic: Last eye exam / retinopathy screening: Last diabetic foot exam:  Future Appointments  Date Time Provider Department Center  08/19/2022  3:35 PM Ronney Asters, NP [facility] None  08/22/2022 11:00 AM Erroll Luna, RPH [facility] None  08/29/2022  3:30 PM [facility] [place]  10/15/2022  1:00 PM Lula Olszewski, MD [facility]   April D Calhoun, [facility] Clinical Pharmacist Assistant [phone]

## 2022-08-18 NOTE — Progress Notes (Unsigned)
Cardiology Clinic Note   Patient Name: Anita Fletcher Date of Encounter: 08/19/2022  Primary Care Provider:  Lula Olszewski, MD Primary Cardiologist:  Anita Harps, MD  Patient Profile    Anita Fletcher 77 year old female presents the clinic today for follow-up evaluation of her paroxysmal atrial fibrillation and hypertension.  Past Medical History    Past Medical History:  Diagnosis Date   Acute CVA (cerebrovascular accident) (HCC) 01/27/2022   Arm pain 07/09/2022   Started around mid March woke her up feels like broken arm in the right midshaft humerus feels like muscle pain feels like tightness in the shoulder feels like numbness in the hand Is not clear whether moving the arm aggravates the pain she says it does not hurt more but it does not help it She has not been able to identify anything that relieves the pain or triggers the pain The pain is like a   Hearing loss 07/10/2022   Started to be problematic a couple months after cerebrovascular accident   HTN (hypertension) 01/27/2022   Leg swelling 06/18/2022   Anita Fletcher noticed a few weeks ago. Patient complains of numbness sensation without loss of sensation I closely evaluated her legs and she has bilateral mild pitting edema 2+ with increased sensation of edema on the right leg but I cannot really tell the difference.  There is no tenderness in her calves there is excellent circulation and pulse on bilateral dorsal pedis and posterior tibialis, excell   Neuropathic pain 04/03/2022   She is a lot more pain in her right hand that is nerve pain so I suspect she is recovering some from her stroke but I advised that she increase her gabapentin it even though she rather avoid that because of the drowsiness am sending in a higher dose so that she at least has it available for use if the pain gets too much.  I okayed her to return to work.  I offered to send her to physical therapy a   Neuropathy 06/18/2022   Sensation of  numbness R leg only First noticed with swelling 05/2022, seemed disconnected to her CVA October to November. 2023     PAF (paroxysmal atrial fibrillation) (HCC)    TIA (transient ischemic attack)    Past Surgical History:  Procedure Laterality Date   EYE SURGERY     IR ANGIO INTRA EXTRACRAN SEL COM CAROTID INNOMINATE BILAT MOD SED  01/30/2022   IR ANGIO VERTEBRAL SEL VERTEBRAL UNI L MOD SED  01/30/2022   IR ANGIO VERTEBRAL SEL VERTEBRAL UNI L MOD SED  01/31/2022   IR ANGIOGRAM FOLLOW UP STUDY  01/31/2022   IR CT HEAD LTD  01/31/2022   IR INTRA CRAN STENT  01/31/2022   IR US GUIDE VASC ACCESS LEFT  01/31/2022   IR US GUIDE VASC ACCESS RIGHT  01/30/2022   RADIOLOGY WITH ANESTHESIA N/A 01/30/2022   Procedure: MRI WITH ANESTHESIA - BRAIN W/O CONSTRAST;  Surgeon: Radiologist, Medication, MD;  Location: [facility] OR;  Service: Radiology;  Laterality: N/A;   RADIOLOGY WITH ANESTHESIA N/A 01/31/2022   Procedure: RADIOLOGY WITH ANESTHESIA;  Surgeon: Radiologist, Medication, MD;  Location: [facility] OR;  Service: Radiology;  Laterality: N/A;    Allergies  Allergies  Allergen Reactions   Codeine     History of Present Illness    Anita Fletcher has a PMH of paroxysmal atrial fibrillation hypertension, CVA with residual deficit, hypokalemia, insomnia, constipation, and neuropathic pain.  CHA2DS2-VASc score 3 (age,  hypertension, female).  Echocardiogram showed normal LV function.  She was seen in follow-up by Dr. Flora Fletcher on 01/16/2022.  During that time there were concerns for anterior ischemia on her nuclear medicine stress test.  Her previous monitor showed no recurrent atrial fibrillation.  Her blood pressure was elevated at 160/100.  She indicated that her blood pressure was also elevated at home.  She denied recurrent atrial fibrillation/palpitations.  She was using a Anita Fletcher mobile device.  She denied chest pain and trouble with her breathing.  She did not have formal exercise routine.  She reported compliance with  apixaban and denied bleeding issues.  A BMP and coronary CTA were ordered but not completed.  Losartan 50 mg daily was also prescribed.  She presented to the clinic 05/16/22 for follow-up evaluation and stated she had two CVA's.  Her first CVA was October 30.  She was undergoing carotid endarterectomy and had a second CVA during the procedure.  She was then sent to rehab and is not driving at this time.  She was unable to complete coronary CTA.  We reviewed her previous stress testing and she expressed understanding.  She did not feel that she was taking metoprolol and had noted episodes of atrial fibrillation.  I started metoprolol tartrate 12.5 mg daily and  instructed her that she may take an extra 12.5 mg for episodes of sustained palpitations.  We reviewed triggers for atrial fibrillation.  I will plan follow-up in 3 to 4 months.  She presents to the clinic today for follow-up evaluation and states her blood pressure has been elevated in the 150s-160 systolic at home.  She continues to work on physical therapy movements for her right side.  She is regaining sensation in her right arm.  She reports that she will have a CT which is scheduled for the 31st.  We reviewed need for coronary CTA.  She wishes to defer at this time due to not being able to drive.  I will stop her losartan and start valsartan 80 mg.  We will repeat BMP in 2 weeks, give salty 6 diet she, and plan follow-up in 2 to 3 months.  Today she denies chest pain, shortness of breath, lower extremity edema, fatigue, palpitations, melena, hematuria, hemoptysis, diaphoresis, weakness, presyncope, syncope, orthopnea, and PND.   Home Medications    Prior to Admission medications   Medication Sig Start Date End Date Taking? Authorizing Provider  amLODipine (NORVASC) 5 MG tablet Take 1 tablet (5 mg total) by mouth daily. 02/28/22   Anita Olszewski, MD  apixaban (ELIQUIS) 5 MG TABS tablet Take 1 tablet (5 mg total) by mouth 2 (two) times daily.  02/28/22   Anita Olszewski, MD  atorvastatin (LIPITOR) 40 MG tablet Take 1 tablet (40 mg total) by mouth daily. 02/28/22   Anita Olszewski, MD  diclofenac Sodium (VOLTAREN) 1 % GEL Apply 2 g topically 4 (four) times daily. 02/13/22   Love, Evlyn Kanner, PA-C  fluticasone (FLONASE) 50 MCG/ACT nasal spray Use 1 spray in each nostril once daily. 02/14/22   Love, Evlyn Kanner, PA-C  gabapentin (NEURONTIN) 100 MG capsule Take 1 capsule (100 mg total) by mouth 3 (three) times daily. 04/03/22   Anita Olszewski, MD  lidocaine (XYLOCAINE) 5 % ointment Apply 1 Application topically as needed. 04/03/22   Anita Olszewski, MD  loratadine (CLARITIN) 10 MG tablet Take 1 tablet (10 mg total) by mouth daily. 03/18/22   Anita Olszewski, MD  losartan Lynelle Smoke)  100 MG tablet Take 1 tablet (100 mg total) by mouth daily. 04/03/22   Anita Olszewski, MD  melatonin 5 MG TABS Take 1 tablet (5 mg total) by mouth at bedtime. 02/28/22   Anita Olszewski, MD  metoprolol tartrate (LOPRESSOR) 25 MG tablet Take 25 mg by mouth daily. 04/23/22   [provider]  potassium chloride (KLOR-CON M) 10 MEQ tablet Take 1 tablet (10 mEq total) by mouth daily. 03/18/22   Anita Olszewski, MD  senna-docusate (SENOKOT-S) 8.6-50 MG tablet Take 1 tablet by mouth 2 (two) times daily. 02/28/22   Anita Olszewski, MD  ticagrelor (BRILINTA) 90 MG TABS tablet Take 1 tablet (90 mg total) by mouth 2 (two) times daily. 02/28/22   Anita Olszewski, MD  DULoxetine (CYMBALTA) 30 MG capsule Take 30 mg by mouth daily. Then increase to two 30 mg tablets (total 60 mg) by mouth once daily  04/17/22  [provider]    Family History    Family History  Problem Relation Age of Onset   Alcoholism Father    Arrhythmia Brother    Diabetes Brother    Mental illness Brother    She indicated that her mother is deceased. She indicated that her father is deceased. She indicated that only one of her two brothers is alive.  Social History    Social History    Socioeconomic History   Marital status: Single    Spouse name: Not on file   Number of children: 0   Years of education: Not on file   Highest education level: Not on file  Occupational History   Occupation: Resume building  Tobacco Use   Smoking status: Never   Smokeless tobacco: Never  Vaping Use   Vaping Use: Never used  Substance and Sexual Activity   Alcohol use: No   Drug use: No   Sexual activity: Not on file  Other Topics Concern   Not on file  Social History Narrative   Not on file   Social Determinants of Health   Financial Resource Strain: Not on file  Food Insecurity: No Food Insecurity (03/18/2022)   Hunger Vital Sign    Worried About Running Out of Food in the Last Year: Never true    Ran Out of Food in the Last Year: Never true  Transportation Needs: No Transportation Needs (03/18/2022)   PRAPARE - Administrator, Civil Service (Medical): No    Lack of Transportation (Non-Medical): No  Physical Activity: Not on file  Stress: Not on file  Social Connections: Not on file  Intimate Partner Violence: Not At Risk (01/29/2022)   Humiliation, Afraid, Rape, and Kick questionnaire    Fear of Current or Ex-Partner: No    Emotionally Abused: No    Physically Abused: No    Sexually Abused: No     Review of Systems    General:  No chills, fever, night sweats or weight changes.  Cardiovascular:  No chest pain, dyspnea on exertion, edema, orthopnea, palpitations, paroxysmal nocturnal dyspnea. Dermatological: No rash, lesions/masses Respiratory: No cough, dyspnea Urologic: No hematuria, dysuria Abdominal:   No nausea, vomiting, diarrhea, bright red blood per rectum, melena, or hematemesis Neurologic:  No visual changes, wkns, changes in mental status. All other systems reviewed and are otherwise negative except as noted above.  Physical Exam    VS:  BP (!) 152/86   Pulse (!) 57   Ht 5' 3.5" (1.613 m)   Wt 157 lb  12.8 oz (71.6 kg)   SpO2 98%    BMI 27.51 kg/m  , BMI Body mass index is 27.51 kg/m. GEN: Well nourished, well developed, in no acute distress. HEENT: normal. Neck: Supple, no JVD, carotid bruits, or masses. Cardiac: RRR, no murmurs, rubs, or gallops. No clubbing, cyanosis, edema.  Radials/DP/PT 2+ and equal bilaterally.  Respiratory:  Respirations regular and unlabored, clear to auscultation bilaterally. GI: Soft, nontender, nondistended, BS + x 4. MS: no deformity or atrophy. Skin: warm and dry, no rash. Neuro:  Strength and sensation are intact. Psych: Normal affect.  Accessory Clinical Findings    Recent Labs: 08/24/2021: TSH 1.526 03/11/2022: Hemoglobin 13.3; Platelets 264 07/09/2022: ALT 16; BUN 12; Creatinine, Ser 0.81; Magnesium 2.0; Potassium 3.5; Sodium 142   Recent Lipid Panel    Component Value Date/Time   CHOL 226 (H) 01/28/2022 0319   TRIG 253 (H) 01/28/2022 0319   HDL 35 (L) 01/28/2022 0319   CHOLHDL 6.5 01/28/2022 0319   VLDL 51 (H) 01/28/2022 0319   LDLCALC 140 (H) 01/28/2022 0319    HYPERTENSION CONTROL Vitals:   08/19/22 1600 08/19/22 1631  BP: (!) 160/86 (!) 152/86    The patient's blood pressure is elevated above target today.  In order to address the patient's elevated BP: Blood pressure will be monitored at home to determine if medication changes need to be made.; A new medication was prescribed today.       ECG personally reviewed by me today-none today.  Echocardiogram 01/28/2022 IMPRESSIONS     1. Limited study.   2. Left ventricular ejection fraction, by estimation, is 70 to 75%. The  left ventricle has hyperdynamic function. 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 Left ventricular diastolic  function could not be evaluated.   3. The mitral valve is abnormal, mild to moderate annular calcification.   4. The aortic valve is tricuspid. Aortic valve sclerosis is present, with  no evidence of aortic valve stenosis.   5. The  inferior vena cava is normal in size with greater than 50%  respiratory variability, suggesting right atrial pressure of 3 mmHg.   FINDINGS   Left Ventricle: Left ventricular ejection fraction, by estimation, is 70  to 75%. The left ventricle has hyperdynamic function. The left ventricular  internal cavity size was normal in size. There is severe asymmetric left  ventricular hypertrophy of the  basal segment. Left ventricular diastolic function could not be evaluated.   Pericardium: There is no evidence of pericardial effusion. Presence of  epicardial fat layer.   Mitral Valve: The mitral valve is abnormal. Mild to moderate mitral  annular calcification.   Tricuspid Valve: The tricuspid valve is grossly normal.   Aortic Valve: The aortic valve is tricuspid. There is mild aortic valve  annular calcification. Aortic valve sclerosis is present, with no evidence  of aortic valve stenosis.   Aorta: The aortic root is normal in size and structure.   Venous: The inferior vena cava is normal in size with greater than 50%  respiratory variability, suggesting right atrial pressure of 3 mmHg.   IAS/Shunts: The interatrial septum was not assessed.    Nuclear stress test 12/17/2021    Findings are consistent with ischemia. The study is low-intermediate risk.   No ST deviation was noted. ECG rhythm shows sinus bradycardia at rest.   LV perfusion is abnormal. There is evidence of ischemia. Defect 1: There is a small defect with moderate reduction in uptake present  in the apical to mid anterior location(s) that is partially reversible (see graphic). There is abnormal wall motion in the defect area. Consistent with ischemia given anteroapical wall motion abnormality.   Left ventricular function is grossly normal. Nuclear stress EF: 50 %, however visually appears closer to 60%.  End diastolic cavity size is normal. End systolic cavity size is normal.   Prior study not available for  comparison.  Assessment & Plan   1.  Paroxysmal atrial fibrillation-heart rate today 57 bpm.  Reports continued compliance with apixaban and denies bleeding issues.   Denies sustained irregular heartbeats or palpitations. Continue  apixaban, metoprolol 12.5 mg daily.  May take an extra 12.5 mg for sustained palpitations.-Reviewed Heart healthy low-sodium diet-continue Avoid triggers caffeine, chocolate, EtOH, dehydration etc.-reviewed  Essential hypertension-BP today 152/86.   Continue   amlodipine, metoprolol Order valsartan 80 mg Maintain blood pressure log BMP in 2 wks   Abnormal cardiac stress test-denies recent episodes of chest discomfort.  Previously she was noted to have mild anterior defect on nuclear stress testing.  It was felt that this may be related to artifact.  CCTA was ordered for further evaluation but not completed.  Of note, she had 2 strokes towards the end of 2023. Her stroke was October 30 and post CVA she underwent  carotid stenting and had second CVA she is right side affected.    Previously she wished to defer coronary CTA. Does not want to do cta at this time d/t not being able to drive.   Disposition: Follow-up with Dr. Flora Fletcher or me in 2-3 months.   Thomasene Ripple. Herrick Hartog NP-C     08/19/2022, 4:33 PM [facility] [address] Office [phone] Fax [phone]    I spent 14 minutes examining this patient, reviewing medications, and using patient centered shared decision making involving her cardiac care.  Prior to her visit I spent greater than 20 minutes reviewing her past medical history,  medications, and prior cardiac tests.

## 2022-08-19 ENCOUNTER — Ambulatory Visit: Payer: Medicare HMO | Attending: General Practice | Admitting: General Practice

## 2022-08-19 ENCOUNTER — Other Ambulatory Visit: Payer: Self-pay

## 2022-08-19 ENCOUNTER — Encounter: Payer: Self-pay | Admitting: General Practice

## 2022-08-19 VITALS — BP 152/86 | HR 57 | Ht 63.5 in | Wt 157.8 lb

## 2022-08-19 DIAGNOSIS — I480 Paroxysmal atrial fibrillation: Secondary | ICD-10-CM

## 2022-08-19 DIAGNOSIS — I10 Essential (primary) hypertension: Secondary | ICD-10-CM

## 2022-08-19 DIAGNOSIS — R9439 Abnormal result of other cardiovascular function study: Secondary | ICD-10-CM

## 2022-08-19 MED ORDER — VALSARTAN 80 MG PO TABS: 80.0000 mg | ORAL_TABLET | Freq: Every day | ORAL | 3 refills | Status: DC

## 2022-08-19 NOTE — Patient Instructions (Signed)
Medication Instructions:  Stop Losartan as directed. Start Valsartan 80 mg daily.  *If you need a refill on your cardiac medications before your next appointment, please call your pharmacy*   Lab Work: Your physician recommends that you return for lab work in 2 weeks. BMET  If you have labs (blood work) drawn today and your tests are completely normal, you will receive your results only by: MyChart Message (if you have MyChart) OR A paper copy in the mail If you have any lab test that is abnormal or we need to change your treatment, we will call you to review the results.   Testing/Procedures: NONE ordered at this time of appointment     Follow-Up: At [facility], you and your health needs are our priority.  As part of our continuing mission to provide you with exceptional heart care, we have created designated Provider Care Teams.  These Care Teams include your primary Cardiologist (physician) and Advanced Practice Providers (APPs -  Physician Assistants and Nurse Practitioners) who all work together to provide you with the care you need, when you need it.  We recommend signing up for the patient portal called "MyChart".  Sign up information is provided on this After Visit Summary.  MyChart is used to connect with patients for Virtual Visits (Telemedicine).  Patients are able to view lab/test results, encounter notes, upcoming appointments, etc.  Non-urgent messages can be sent to your provider as well.   To learn more about what you can do with MyChart, go to [email or web address].    Your next appointment:   2-3 month(s)  Provider:   Reatha Harps, MD  or Edd Fabian, FNP        Other Instructions Call the office in two weeks if your systolic BP (top number) hasn't gone down to 140.

## 2022-08-22 ENCOUNTER — Ambulatory Visit: Payer: Medicare HMO | Admitting: Pharmacist

## 2022-08-22 NOTE — Progress Notes (Signed)
Care Management & Coordination Services Pharmacy Note  08/22/2022 Name:  Anita Fletcher MRN:  [id number] DOB:  November 23, 1945  Summary: PharmD initial visit.  Patient is concerned with copay of Brilinta as it has doubled recently.  We discussed her stroke risk and history.  Her LDL is elevated, she was not taking Crestor I believe due to confusion.  BP elevated but recent start of Valsartan through cardiology seems to be improving.  She has also never had a dexa scan.    Recommendations/Changes made from today's visit: Recommend scheduling DEXA scan - PCP to order Resume Crestor - recheck lipids at OV scheduled for July Consider Plavix to replace Brilinta - how long dual antiplatelet  Follow up plan: FU 3 months CMA to FU on MP in 2-4 weeks   Subjective: Anita Fletcher is an 76 y.o. year old female who is a primary patient of Lula Olszewski, MD.  The care coordination team was consulted for assistance with disease management and care coordination needs.    Engaged with patient by telephone for initial visit.  Recent office visits:  07/23/2022 OV (PCP) Lula Olszewski, MD; no medication changes indicated.   07/09/2022 OV (PCP) Lula Olszewski, MD; I encouraged her not to take the fluid pill and to hydrate with an electrolyte containing solution like Gatorade or liquid IV until we can get the results of the lab work.   06/18/2022 OV (PCP) Lula Olszewski, MD;  Furosemide added today for uncontrolled blood pressure and leg swelling She reports high blood pressure at home She reports she is taking the 100 and she is still having a lot of very bothersome numbing sensation in her right leg and so she is agreeable to go up on the dose of this so I am increasing it to 300 times a day as needed for numbing numbing sensation.   04/17/2022 OV (PCP) Lula Olszewski, MD;  no medication changes indicated.   04/03/2022 OV (PCP) Lula Olszewski, MD;  I advised that she increase her gabapentin it even  though she rather avoid that because of the drowsiness am sending in a higher dose so that she at least has it available for use if the pain gets too much    02/28/2022 OV (PCP) Lula Olszewski, MD; no medication changes indicated.   Recent consult visits:  05/16/2022 OV (Cardiology) Ronney Asters, NP; Start metoprolol 12.5 mg daily. May take an extra 12.5 mg for sustained palpitations.    05/09/2022 OV (Phys Med) Erick Colace, MD; no medication changes indicated.   04/16/2022 OV (Neurology) Penumalli, Glenford Bayley, MD; no medication changes indicated.   02/26/2022 OV (Phys Med) Jones Bales, NP; no medication changes indicated,   Hospital visits:  03/11/2022 ED visit for Acute cystitis with hematuria   Objective:  Lab Results  Component Value Date   CREATININE 0.81 07/09/2022   BUN 12 07/09/2022   GFR 71.14 07/09/2022   GFRNONAA >60 03/11/2022   GFRAA >90 07/05/2013   NA 142 07/09/2022   K 3.5 07/09/2022   CALCIUM 9.2 07/09/2022   CO2 27 07/09/2022   GLUCOSE 86 07/09/2022    Lab Results  Component Value Date/Time   HGBA1C 5.5 01/28/2022 03:19 AM   GFR 71.14 07/09/2022 04:52 PM    Last diabetic Eye exam: No results found for: "HMDIABEYEEXA"  Last diabetic Foot exam: No results found for: "HMDIABFOOTEX"   Lab Results  Component Value Date   CHOL 226 (H)  01/28/2022   HDL 35 (L) 01/28/2022   LDLCALC 140 (H) 01/28/2022   TRIG 253 (H) 01/28/2022   CHOLHDL 6.5 01/28/2022       Latest Ref Rng & Units 07/09/2022    4:52 PM 03/11/2022   10:18 AM 02/05/2022    5:54 AM  Hepatic Function  Total Protein 6.0 - 8.3 g/dL 6.7  6.2  6.0   Albumin 3.5 - 5.2 g/dL 4.1  3.5  3.3   AST 0 - 37 U/L 17  20  31    ALT 0 - 35 U/L 16  21  45   Alk Phosphatase 39 - 117 U/L 65  64  51   Total Bilirubin 0.2 - 1.2 mg/dL 0.5  0.7  0.9     Lab Results  Component Value Date/Time   TSH 1.526 08/24/2021 03:07 PM       Latest Ref Rng & Units 03/11/2022   10:18 AM 02/10/2022     5:10 AM 02/05/2022    5:54 AM  CBC  WBC 4.0 - 10.5 K/uL 7.5  7.8  6.8   Hemoglobin 12.0 - 15.0 g/dL 91.4  78.2  95.6   Hematocrit 36.0 - 46.0 % 40.3  39.7  42.4   Platelets 150 - 400 K/uL 264  268  256     No results found for: "VD25OH", "VITAMINB12"  Clinical ASCVD: Yes  The ASCVD Risk score (Arnett DK, et al., 2019) failed to calculate for the following reasons:   The patient has a prior MI or stroke diagnosis        05/09/2022   12:58 PM 02/26/2022    1:28 PM  Depression screen PHQ 2/9  Decreased Interest 0 1  Down, Depressed, Hopeless 0 1  PHQ - 2 Score 0 2  Altered sleeping  0  Tired, decreased energy  3  Change in appetite  1  Feeling bad or failure about yourself   1  Trouble concentrating  0  Moving slowly or fidgety/restless  0  Suicidal thoughts  0  PHQ-9 Score  7  Difficult doing work/chores  Somewhat difficult     Social History   Tobacco Use  Smoking Status Never  Smokeless Tobacco Never   BP Readings from Last 3 Encounters:  08/19/22 (!) 152/86  07/23/22 (!) 154/89  07/09/22 (!) 150/88   Pulse Readings from Last 3 Encounters:  08/19/22 (!) 57  07/23/22 60  07/09/22 64   Wt Readings from Last 3 Encounters:  08/19/22 157 lb 12.8 oz (71.6 kg)  07/23/22 158 lb (71.7 kg)  07/09/22 157 lb (71.2 kg)   BMI Readings from Last 3 Encounters:  08/19/22 27.51 kg/m  07/23/22 27.12 kg/m  07/09/22 26.95 kg/m    Allergies  Allergen Reactions   Codeine     Medications Reviewed Today     Reviewed by Erroll Luna, [facility] (Pharmacist) on 08/22/22 at 1209  Med List Status: <None>   Medication Order Taking? Sig Documenting Provider Last Dose Status Informant  amLODipine (NORVASC) 5 MG tablet [id number] Yes Take 1 tablet (5 mg total) by mouth at bedtime. Lula Olszewski, MD Taking Active   apixaban (ELIQUIS) 5 MG TABS tablet [id number] Yes Take 1 tablet (5 mg total) by mouth 2 (two) times daily. Lula Olszewski, MD Taking Active   Cyanocobalamin  (B-12) 1000 MCG CAPS [id number] Yes Take 1 each by mouth daily at 6 (six) AM. Lula Olszewski, MD Taking Active   diclofenac Sodium (VOLTAREN) 1 %  GEL [id number] No Apply 2 g topically 4 (four) times daily.  Patient not taking: Reported on 08/19/2022   Jerene Pitch Not Taking Active     Discontinued 04/17/22 1319 (Completed Course)   fluticasone (FLONASE) 50 MCG/ACT nasal spray [id number]  Use 1 spray in each nostril once daily. Jacquelynn Cree, PA-C  Active   furosemide (LASIX) 20 MG tablet [id number] Yes Take 1 tablet (20 mg total) by mouth 2 (two) times daily as needed for fluid or edema (take 1-2 tablets daily am only if there is leg swelling). Lula Olszewski, MD Taking Active   gabapentin (NEURONTIN) 300 MG capsule [id number] Yes Take 1 capsule (300 mg total) by mouth 3 (three) times daily. Lula Olszewski, MD Taking Active            Med Note Stevphen Rochester, Posey Boyer T   Tue Aug 19, 2022  3:58 PM) PATIENT TAKING 100MG , 1 TABLET AT BEDTIME   lidocaine (LIDODERM) 5 % [id number] No Place 3 patches onto the skin daily. Remove & Discard patch within 12 hours or as directed by MD  Patient not taking: Reported on 08/19/2022   Lula Olszewski, MD Not Taking Active   loratadine (CLARITIN) 10 MG tablet [id number] Yes Take 1 tablet (10 mg total) by mouth at bedtime. Lula Olszewski, MD Taking Active   melatonin 5 MG TABS [id number] Yes Take 1 tablet (5 mg total) by mouth at bedtime. Lula Olszewski, MD Taking Active   methocarbamol (ROBAXIN) 500 MG tablet [id number] Yes Take 1 tablet (500 mg total) by mouth 4 (four) times daily. Take for muscle pain and spasm Lula Olszewski, MD Taking Active            Med Note Corey Harold T   Tue Aug 19, 2022  3:59 PM) PATIENT TAKING 1 TABLET AT BEDTIME  metoprolol tartrate (LOPRESSOR) 25 MG tablet [id number] Yes Take 0.5 tablets (12.5 mg total) by mouth daily. Ronney Asters, NP Taking Active   potassium chloride (KLOR-CON M) 10 MEQ tablet [id number] Yes Take 1  tablet (10 mEq total) by mouth daily. Lula Olszewski, MD Taking Active   rosuvastatin (CRESTOR) 20 MG tablet [id number]  Take 1 tablet (20 mg total) by mouth daily. Replaces atorvastatin.  Take a break for 1 week before starting to let your muscle injury from atorvastatin heal.  Patient not taking: Reported on 08/19/2022   Lula Olszewski, MD  Active   senna-docusate (SENOKOT-S) 8.6-50 MG tablet [id number] Yes Take 1 tablet by mouth 2 (two) times daily. Lula Olszewski, MD Taking Active   ticagrelor [facility]) 90 MG TABS tablet [id number] Yes Take 1 tablet (90 mg total) by mouth 2 (two) times daily. Lula Olszewski, MD Taking Active   valsartan (DIOVAN) 80 MG tablet [id number] Yes Take 1 tablet (80 mg total) by mouth daily. Ronney Asters, NP Taking Active             SDOH:  (Social Determinants of Health) assessments and interventions performed: Yes Financial Resource Strain: Low Risk  (08/22/2022)   Overall Financial Resource Strain (CARDIA)    Difficulty of Paying Living Expenses: Not hard at all   Food Insecurity: No Food Insecurity (08/22/2022)   Hunger Vital Sign    Worried About Running Out of Food in the Last Year: Never true    Ran Out of Food in the Last Year: Never true    SDOH Interventions    Flowsheet Row Telephone from  03/18/2022 in [employer] Care Coordination  SDOH Interventions   Food Insecurity Interventions Intervention Not Indicated  Transportation Interventions Intervention Not Indicated       Medication Assistance: None required.  Patient affirms current coverage meets needs.  Medication Access: Name and location of current pharmacy:  [id number] [place], [place] - [address] [place] [address] Phone: [phone] Fax: [phone]  Within the past 30 days, how often has patient missed a dose of medication? 0 Is a pillbox or other method used to improve adherence? Yes  Factors that  may affect medication adherence? financial need Are meds synced by current pharmacy? No  Are meds delivered by current pharmacy? No  Does patient experience delays in picking up medications due to transportation concerns? No   Compliance/Adherence/Medication fill history: Star Rating Drugs:  Atorvastatin 40 mg last filled 06/07/2022 90 DS Losartan 100 mg last filled 07/15/2022 90 DS Rosuvastatin 20 mg last filled 07/09/2022 90 DS     Care Gaps: Annual wellness visit in last year? No   Assessment/Plan   Hypertension (BP goal <130/80) -Uncontrolled -Current treatment: Valsartan 80mg  Appropriate, Query effective, ,  Amlodipine 5mg  Appropriate, Query effective, ,  Metoprolol 25mg  bid Appropriate, Query effective, ,  -Medications previously tried: losartan  -Current home readings: 140s/80-90s HR 60s -Current exercise habits: got a machine to move her feet while she works -Denies hypotensive/hypertensive symptoms -Educated on BP goals and benefits of medications for prevention of heart attack, stroke and kidney damage; Daily salt intake goal < 2300 mg; Exercise goal of 150 minutes per week; Importance of home blood pressure monitoring; -Counseled to monitor BP at home daily as she currently is, document, and provide log at future appointments -Counseled on diet and exercise extensively Recommended to continue current medication Will have CMA check regularly on home BP readings.  Recommend to continue with exercise at least 15-30 minutes each day.  Importance of BP control.  If BP remains elevated, would recommend increasing amlodipine to 10mg  daily, or titrating Valsartan dose.  CMA to call in 2-4 weeks to assess BP with goal of majority of readings < 130/80.  Hyperlipidemia: (LDL goal < 70) -Uncontrolled, LDL is 140 most recently -Current treatment: Rosuvastatin 20mg  Appropriate, Query effective, ,  -Medications previously tried: Atorvastatin (stopped due to possible muscle pain)   -Current exercise habits: see HTn -Educated on Cholesterol goals;  Benefits of statin for ASCVD risk reduction; Importance of limiting foods high in cholesterol; -Recommended resume Rosuvastatin.  She previously stopped due to unknown reason.  She did not notice a difference in pain when she stopped statin.  Due to stroke risk and past CVA history recommend statin - recheck lipids at next scheduled OV in two months.  CMA to call to assess tolerance.  Atrial Fibrillation (Goal: prevent stroke and major bleeding) -Uncontrolled -CHADSVASC: 3 -Current treatment: Rate control: Metoprolol tartrate 12.5mg  once daily Appropriate, Effective, Safe, Accessible Anticoagulation: Eliquis 5mg  bid Appropriate, Effective, Safe, Accessible Brilinta 90mg  bid Appropriate, Effective, Safe, Query accessible -Medications previously tried: none noted -Home BP and HR readings: elevated, see HTN - BP has been consistently above goal but they did just add Valsartan and she reports that BP has been better  -Counseled on increased risk of stroke due to Afib and benefits of anticoagulation for stroke prevention; importance of adherence to anticoagulant exactly as prescribed; -Recommended to continue current medication Will have CMA to call to check on BP.  Assessed fall risk and dual antiplatelet  therapy.  Followed by cardiology  Fall risk (Goal: Reduce fall risk and fracture risk) -Controlled -Patient with multiple medications that could pose fall risk.  Also dual anticoagulant and bleed risk assessed.  We need to get her scheduled for a DEXA scan as it appears this has not been done yet.  Also discuss risk vs. Benefit of dual anticoagulant.  I know she has an event while on Eliquis so that could be reason for dual long term.  Will consult with PCP and cardiology. -Medications previously tried: none noted  - Will coordinate DEXA - CMA to FU to ensure this gets scheduled.  Willa Frater, PharmD Clinical Pharmacist   [facility] [phone]

## 2022-08-25 ENCOUNTER — Other Ambulatory Visit: Payer: Self-pay | Admitting: Internal Medicine

## 2022-08-25 DIAGNOSIS — Z780 Asymptomatic menopausal state: Secondary | ICD-10-CM

## 2022-08-26 ENCOUNTER — Other Ambulatory Visit: Payer: Self-pay

## 2022-08-26 DIAGNOSIS — E782 Mixed hyperlipidemia: Secondary | ICD-10-CM

## 2022-08-29 ENCOUNTER — Ambulatory Visit (HOSPITAL_COMMUNITY)
Admission: RE | Admit: 2022-08-29 | Discharge: 2022-08-29 | Disposition: A | Discharge status: Home / Self Care | Payer: Medicare HMO | Source: Ambulatory Visit | Attending: Neuroradiology | Admitting: Neuroradiology

## 2022-08-29 DIAGNOSIS — I6523 Occlusion and stenosis of bilateral carotid arteries: Secondary | ICD-10-CM | POA: Diagnosis not present

## 2022-08-29 DIAGNOSIS — I639 Cerebral infarction, unspecified: Secondary | ICD-10-CM | POA: Insufficient documentation

## 2022-08-29 DIAGNOSIS — I672 Cerebral atherosclerosis: Secondary | ICD-10-CM | POA: Diagnosis not present

## 2022-08-29 DIAGNOSIS — I651 Occlusion and stenosis of basilar artery: Secondary | ICD-10-CM | POA: Insufficient documentation

## 2022-08-29 MED ORDER — IOHEXOL 350 MG/ML SOLN
75.0000 mL | Freq: Once | INTRAVENOUS | Status: AC | PRN
  Administered 2022-08-29: 75 mL via INTRAVENOUS

## 2022-08-29 MED ORDER — SODIUM CHLORIDE (PF) 0.9 % IJ SOLN
INTRAMUSCULAR | Status: AC
  Filled 2022-08-29: qty 50

## 2022-09-08 ENCOUNTER — Telehealth: Payer: Self-pay | Admitting: Pharmacist

## 2022-09-08 NOTE — Progress Notes (Signed)
I spoke with Ms. Saari, to support he with getting scheduled for a DEXA scan at [facility], and she made me aware that right now she is wanting to put this on hold as she is having difficulty with her right ear, that is causing he bad vertigo, and what appears to be a painful stuffy  ear per the patient. She made me aware that she has been seen by a audiologist, that per the patient she feels has not helped. Ms. Routson did mention she will be scheduling a appt with Dr. Jon Billings soon, that her driver that accompanies her to appointments is working, I offered her transportation through [employer], but she declined at this time. I informed her that if she changed her mind, and needed transportation support, to give me a call.    Shontae Stokes

## 2022-09-10 DIAGNOSIS — H6991 Unspecified Eustachian tube disorder, right ear: Secondary | ICD-10-CM | POA: Diagnosis not present

## 2022-09-10 DIAGNOSIS — N390 Urinary tract infection, site not specified: Secondary | ICD-10-CM | POA: Diagnosis not present

## 2022-09-11 ENCOUNTER — Telehealth (HOSPITAL_COMMUNITY): Payer: Self-pay | Admitting: Radiology

## 2022-09-11 NOTE — Telephone Encounter (Signed)
Called pt to schedule f/u visit to discuss recent CTA and medications per Dr. Quay Burow. No answer, left VM for her to call back to schedule. JM

## 2022-09-17 ENCOUNTER — Other Ambulatory Visit: Payer: Self-pay | Admitting: Internal Medicine

## 2022-09-17 DIAGNOSIS — M7989 Other specified soft tissue disorders: Secondary | ICD-10-CM

## 2022-09-18 ENCOUNTER — Ambulatory Visit (HOSPITAL_COMMUNITY)
Admission: RE | Admit: 2022-09-18 | Discharge: 2022-09-18 | Disposition: A | Discharge status: Home / Self Care | Payer: Medicare HMO | Source: Ambulatory Visit | Attending: Neuroradiology | Admitting: Neuroradiology

## 2022-09-18 DIAGNOSIS — I672 Cerebral atherosclerosis: Secondary | ICD-10-CM

## 2022-09-18 DIAGNOSIS — I651 Occlusion and stenosis of basilar artery: Secondary | ICD-10-CM

## 2022-09-18 DIAGNOSIS — I639 Cerebral infarction, unspecified: Secondary | ICD-10-CM

## 2022-09-18 NOTE — Progress Notes (Signed)
Referring Physician(s): de Macedo Rodrigues,Wael Maestas  Chief Complaint: The patient is seen in follow up today s/p basilar artery angioplasty and stenting.  History of present illness:  Anita Fletcher is a 77 year old female with past medical history significant for hypertension, atrial fibrillation, and TIA 10 years ago.  She presented to emergency around in October 2023 with intermittent right sided numbness in her arms and legs.  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 She then underwent basilar artery angioplasty and stenting on January 31, 2022.  She presents today follow-up.  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     Patient refers great recovery from stroke but complains of right-sided upper and lower extremity pain.  She says pain improves with gabapentin but she stopped taking because it made her too drowsy.  She denies imbalance, incoordination or numbness.  She has been taking Eliquis and Brilinta.   Past Medical History:  Diagnosis Date   Acute CVA (cerebrovascular accident) (HCC) 01/27/2022   Arm pain 07/09/2022   Started around mid March woke her up feels like broken arm in the right midshaft humerus feels like muscle pain feels like tightness in the shoulder feels like numbness in the hand Is not clear whether moving the arm aggravates the pain she says it does not hurt more but it does not help it She has not been able to identify anything that relieves the pain or triggers the pain The pain is like a   Hearing loss 07/10/2022   Started to be problematic a couple months after cerebrovascular accident   HTN (hypertension) 01/27/2022   Leg swelling 06/18/2022   Anita Fletcher noticed a few weeks ago. Patient complains of numbness sensation without loss of sensation I  closely evaluated her legs and she has bilateral mild pitting edema 2+ with increased sensation of edema on the right leg but I cannot really tell the difference.  There is no tenderness in her calves there is excellent circulation and pulse on bilateral dorsal pedis and posterior tibialis, excell   Neuropathic pain 04/03/2022   She is a lot more pain in her right hand that is nerve pain so I suspect she is recovering some from her stroke but I advised that she increase her gabapentin it even though she rather avoid that because of the drowsiness am sending in a higher dose so that she at least has it available for use if the pain gets too much.  I okayed her to return to work.  I offered to send her to physical therapy a   Neuropathy 06/18/2022   Sensation of numbness R leg only First noticed with swelling 05/2022, seemed disconnected to her CVA October to November. 2023     PAF (paroxysmal atrial fibrillation) (HCC)    TIA (transient ischemic attack)     Past Surgical History:  Procedure Laterality Date   EYE SURGERY     IR ANGIO INTRA EXTRACRAN SEL COM CAROTID INNOMINATE BILAT MOD SED  01/30/2022   IR ANGIO VERTEBRAL SEL VERTEBRAL UNI L MOD SED  01/30/2022   IR ANGIO VERTEBRAL SEL VERTEBRAL UNI L MOD SED  01/31/2022   IR ANGIOGRAM FOLLOW UP STUDY  01/31/2022   IR CT HEAD LTD  01/31/2022   IR INTRA CRAN STENT  01/31/2022   IR US GUIDE VASC  ACCESS LEFT  01/31/2022   IR US GUIDE VASC ACCESS RIGHT  01/30/2022   RADIOLOGY WITH ANESTHESIA N/A 01/30/2022   Procedure: MRI WITH ANESTHESIA - BRAIN W/O CONSTRAST;  Surgeon: Radiologist, Medication, MD;  Location: [facility] OR;  Service: Radiology;  Laterality: N/A;   RADIOLOGY WITH ANESTHESIA N/A 01/31/2022   Procedure: RADIOLOGY WITH ANESTHESIA;  Surgeon: Radiologist, Medication, MD;  Location: [facility] OR;  Service: Radiology;  Laterality: N/A;    Allergies: Codeine  Medications: Prior to Admission medications   Medication Sig Start Date End Date Taking?  Authorizing Provider  amLODipine (NORVASC) 5 MG tablet Take 1 tablet (5 mg total) by mouth at bedtime. 07/23/22   Lula Olszewski, MD  apixaban (ELIQUIS) 5 MG TABS tablet Take 1 tablet (5 mg total) by mouth 2 (two) times daily. 02/28/22   Lula Olszewski, MD  Cyanocobalamin (B-12) 1000 MCG CAPS Take 1 each by mouth daily at 6 (six) AM. 06/18/22   Lula Olszewski, MD  diclofenac Sodium (VOLTAREN) 1 % GEL Apply 2 g topically 4 (four) times daily. Patient not taking: Reported on 08/19/2022 02/13/22   Love, Evlyn Kanner, PA-C  fluticasone [facility]) 50 MCG/ACT nasal spray Use 1 spray in each nostril once daily. 02/14/22   Love, Evlyn Kanner, PA-C  furosemide (LASIX) 20 MG tablet TAKE 1 TABLET (20 MG TOTAL) BY MOUTH 2 (TWO) TIMES DAILY AS NEEDED FOR FLUID OR EDEMA (TAKE 1-2 TABLETS DAILY AM ONLY IF THERE IS LEG SWELLING). 09/17/22   Lula Olszewski, MD  gabapentin (NEURONTIN) 300 MG capsule Take 1 capsule (300 mg total) by mouth 3 (three) times daily. 06/18/22   Lula Olszewski, MD  lidocaine (LIDODERM) 5 % Place 3 patches onto the skin daily. Remove & Discard patch within 12 hours or as directed by MD Patient not taking: Reported on 08/19/2022 07/09/22   Lula Olszewski, MD  loratadine (CLARITIN) 10 MG tablet Take 1 tablet (10 mg total) by mouth at bedtime. 07/23/22   Lula Olszewski, MD  melatonin 5 MG TABS Take 1 tablet (5 mg total) by mouth at bedtime. 02/28/22   Lula Olszewski, MD  methocarbamol (ROBAXIN) 500 MG tablet Take 1 tablet (500 mg total) by mouth 4 (four) times daily. Take for muscle pain and spasm 07/09/22   Lula Olszewski, MD  metoprolol tartrate (LOPRESSOR) 25 MG tablet Take 0.5 tablets (12.5 mg total) by mouth daily. 05/19/22   Ronney Asters, NP  potassium chloride (KLOR-CON M) 10 MEQ tablet Take 1 tablet (10 mEq total) by mouth daily. 03/18/22   Lula Olszewski, MD  rosuvastatin (CRESTOR) 20 MG tablet Take 1 tablet (20 mg total) by mouth daily. Replaces atorvastatin.  Take a break for 1 week  before starting to let your muscle injury from atorvastatin heal. Patient not taking: Reported on 08/19/2022 07/09/22   Lula Olszewski, MD  senna-docusate (SENOKOT-S) 8.6-50 MG tablet Take 1 tablet by mouth 2 (two) times daily. 02/28/22   Lula Olszewski, MD  ticagrelor (BRILINTA) 90 MG TABS tablet Take 1 tablet (90 mg total) by mouth 2 (two) times daily. 02/28/22   Lula Olszewski, MD  valsartan (DIOVAN) 80 MG tablet Take 1 tablet (80 mg total) by mouth daily. 08/19/22   Ronney Asters, NP  DULoxetine (CYMBALTA) 30 MG capsule Take 30 mg by mouth daily. Then increase to two 30 mg tablets (total 60 mg) by mouth once daily  04/17/22  [provider]  Family History  Problem Relation Age of Onset   Alcoholism Father    Arrhythmia Brother    Diabetes Brother    Mental illness Brother     Social History   Socioeconomic History   Marital status: Single    Spouse name: Not on file   Number of children: 0   Years of education: Not on file   Highest education level: Not on file  Occupational History   Occupation: Resume building  Tobacco Use   Smoking status: Never   Smokeless tobacco: Never  Vaping Use   Vaping Use: Never used  Substance and Sexual Activity   Alcohol use: No   Drug use: No   Sexual activity: Not on file  Other Topics Concern   Not on file  Social History Narrative   Not on file   Social Determinants of Health   Financial Resource Strain: Low Risk  (08/22/2022)   Overall Financial Resource Strain (CARDIA)    Difficulty of Paying Living Expenses: Not hard at all  Food Insecurity: No Food Insecurity (08/22/2022)   Hunger Vital Sign    Worried About Running Out of Food in the Last Year: Never true    Ran Out of Food in the Last Year: Never true  Transportation Needs: No Transportation Needs (03/18/2022)   PRAPARE - Administrator, Civil Service (Medical): No    Lack of Transportation (Non-Medical): No  Physical Activity: Not on file   Stress: Not on file  Social Connections: Not on file     Vital Signs: There were no vitals taken for this visit.  Physical Exam Constitutional:      Appearance: Normal appearance.  HENT:     Head: Normocephalic.     Mouth/Throat:     Mouth: Mucous membranes are moist.     Pharynx: Oropharynx is clear.  Eyes:     Extraocular Movements: Extraocular movements intact.     Conjunctiva/sclera: Conjunctivae normal.     Pupils: Pupils are equal, round, and reactive to light.  Pulmonary:     Effort: Pulmonary effort is normal.  Skin:    General: Skin is warm and dry.  Neurological:     Mental Status: She is alert and oriented to person, place, and time.     Cranial Nerves: Cranial nerves 2-12 are intact.     Sensory: Sensation is intact.     Motor: Motor function is intact.     Coordination: Coordination is intact.     Gait: Gait is intact.     Imaging: EXAM: CT ANGIOGRAPHY HEAD AND NECK IMPRESSION: 1. Patent basilar artery stent. Limited assessment for in stent stenosis by CTA. 2. Severe stenosis of both P2 PCAs. 3. 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   Electronically Signed   By: Feliberto Harts M.D.   On: 09/10/2022 15:16  Labs:  CBC: Recent Labs    02/03/22 0509 02/05/22 0554 02/10/22 0510 03/11/22 1018  WBC 7.6 6.8 7.8 7.5  HGB 13.9 14.2 13.5 13.3  HCT 40.9 42.4 39.7 40.3  PLT 236 256 268 264    COAGS: Recent Labs    01/30/22 0354 02/01/22 0011  INR 1.1  --   APTT  --  32    BMP: Recent Labs    02/03/22 0509 02/05/22 0554 02/10/22 0510 02/28/22 1533 03/11/22 1018 07/09/22 1652  NA 141 142 140 143 144 142  K 3.3* 3.6 3.4* 3.5 3.4* 3.5  CL 109 111 110 109  113* 105  CO2 23 23 20* 21 22 27   GLUCOSE 96 104* 97 93 94 86  BUN 14 16 16 17 10 12   CALCIUM 8.7* 8.9 8.6* 8.8 8.8* 9.2  CREATININE 0.79 0.81 0.64 0.75 0.76 0.81  GFRNONAA >60 >60 >60  --  >60  --     LIVER FUNCTION TESTS: Recent Labs     01/27/22 1352 02/05/22 0554 03/11/22 1018 07/09/22 1652  BILITOT 0.5 0.9 0.7 0.5  AST 14* 31 20 17   ALT 15 45* 21 16  ALKPHOS 56 51 64 65  PROT 6.8 6.0* 6.2* 6.7  ALBUMIN 3.8 3.3* 3.5 4.1    Assessment:  Anita Fletcher has been doing generally well after her stroke with no evidence of progressive vertebrobasilar insufficiency.  Therefore, I recommend discontinuing Brilinta and starting aspirin 81 mg once a day in addition to her Eliquis.  I recommended following up with her neurologist on the management of post stroke thalamic pain.  She understands and agrees with the plan.  Signs and symptoms of a stroke were reviewed.  She was instructed to seek immediate medical attention in case she developed stroke symptoms.   Signed: Baldemar Lenis, MD 09/18/2022, 3:08 PM    I spent a total of    25 Minutes in face to face in clinical consultation, greater than 50% of which was counseling/coordinating care for basilar artery stenosis status post angioplasty and stenting.

## 2022-09-22 ENCOUNTER — Encounter: Payer: Self-pay | Admitting: Internal Medicine

## 2022-09-22 DIAGNOSIS — I651 Occlusion and stenosis of basilar artery: Secondary | ICD-10-CM | POA: Insufficient documentation

## 2022-10-15 ENCOUNTER — Ambulatory Visit: Payer: Medicare HMO | Admitting: Internal Medicine

## 2022-11-10 ENCOUNTER — Ambulatory Visit: Payer: Medicare HMO | Admitting: General Practice

## 2022-11-10 ENCOUNTER — Ambulatory Visit: Payer: Medicare HMO | Admitting: Cardiovascular Disease

## 2022-11-11 DIAGNOSIS — H93293 Other abnormal auditory perceptions, bilateral: Secondary | ICD-10-CM | POA: Diagnosis not present

## 2022-11-11 DIAGNOSIS — H6123 Impacted cerumen, bilateral: Secondary | ICD-10-CM | POA: Diagnosis not present

## 2022-11-24 ENCOUNTER — Encounter: Payer: Medicare HMO | Admitting: Pharmacist

## 2022-12-15 NOTE — Progress Notes (Deleted)
Cardiology Clinic Note   Patient Name: Anita Fletcher Date of Encounter: 12/15/2022  Primary Care Provider:  Lula Olszewski, MD Primary Cardiologist:  Anita Harps, MD  Patient Profile    Anita Fletcher 77 year old female presents the clinic today for follow-up evaluation of her paroxysmal atrial fibrillation and hypertension.  Past Medical History    Past Medical History:  Diagnosis Date   Acute CVA (cerebrovascular accident) (HCC) 01/27/2022   Arm pain 07/09/2022   Started around mid March woke her up feels like broken arm in the right midshaft humerus feels like muscle pain feels like tightness in the shoulder feels like numbness in the hand Is not clear whether moving the arm aggravates the pain she says it does not hurt more but it does not help it She has not been able to identify anything that relieves the pain or triggers the pain The pain is like a   Hearing loss 07/10/2022   Started to be problematic a couple months after cerebrovascular accident   HTN (hypertension) 01/27/2022   Leg swelling 06/18/2022   Anita Fletcher noticed a few weeks ago. Patient complains of numbness sensation without loss of sensation I closely evaluated her legs and she has bilateral mild pitting edema 2+ with increased sensation of edema on the right leg but I cannot really tell the difference.  There is no tenderness in her calves there is excellent circulation and pulse on bilateral dorsal pedis and posterior tibialis, excell   Neuropathic pain 04/03/2022   She is a lot more pain in her right hand that is nerve pain so I suspect she is recovering some from her stroke but I advised that she increase her gabapentin it even though she rather avoid that because of the drowsiness am sending in a higher dose so that she at least has it available for use if the pain gets too much.  I okayed her to return to work.  I offered to send her to physical therapy a   Neuropathy 06/18/2022   Sensation of  numbness R leg only First noticed with swelling 05/2022, seemed disconnected to her CVA October to November. 2023     PAF (paroxysmal atrial fibrillation) (HCC)    TIA (transient ischemic attack)    Past Surgical History:  Procedure Laterality Date   EYE SURGERY     IR ANGIO INTRA EXTRACRAN SEL COM CAROTID INNOMINATE BILAT MOD SED  01/30/2022   IR ANGIO VERTEBRAL SEL VERTEBRAL UNI L MOD SED  01/30/2022   IR ANGIO VERTEBRAL SEL VERTEBRAL UNI L MOD SED  01/31/2022   IR ANGIOGRAM FOLLOW UP STUDY  01/31/2022   IR CT HEAD LTD  01/31/2022   IR INTRA CRAN STENT  01/31/2022   IR US GUIDE VASC ACCESS LEFT  01/31/2022   IR US GUIDE VASC ACCESS RIGHT  01/30/2022   RADIOLOGY WITH ANESTHESIA N/A 01/30/2022   Procedure: MRI WITH ANESTHESIA - BRAIN W/O CONSTRAST;  Surgeon: Radiologist, Medication, MD;  Location: [facility] OR;  Service: Radiology;  Laterality: N/A;   RADIOLOGY WITH ANESTHESIA N/A 01/31/2022   Procedure: RADIOLOGY WITH ANESTHESIA;  Surgeon: Radiologist, Medication, MD;  Location: [facility] OR;  Service: Radiology;  Laterality: N/A;    Allergies  Allergies  Allergen Reactions   Codeine     History of Present Illness    Anita Fletcher has a PMH of paroxysmal atrial fibrillation hypertension, CVA with residual deficit, hypokalemia, insomnia, constipation, and neuropathic pain.  CHA2DS2-VASc score 3 (age,  hypertension, female).  Echocardiogram showed normal LV function.  She was seen in follow-up by Anita Fletcher on 01/16/2022.  During that time there were concerns for anterior ischemia on her nuclear medicine stress test.  Her previous monitor showed no recurrent atrial fibrillation.  Her blood pressure was elevated at 160/100.  She indicated that her blood pressure was also elevated at home.  She denied recurrent atrial fibrillation/palpitations.  She was using a [place] mobile device.  She denied chest pain and trouble with her breathing.  She did not have formal exercise routine.  She reported compliance with  apixaban and denied bleeding issues.  A BMP and coronary CTA were ordered but not completed.  Losartan 50 mg daily was also prescribed.  She presented to the clinic 05/16/22 for follow-up evaluation and stated she had two CVA's.  Her first CVA was October 30.  She was undergoing carotid endarterectomy and had a second CVA during the procedure.  She was then sent to rehab and is not driving at this time.  She was unable to complete coronary CTA.  We reviewed her previous stress testing and she expressed understanding.  She did not feel that she was taking metoprolol and had noted episodes of atrial fibrillation.  I started metoprolol tartrate 12.5 mg daily and  instructed her that she may take an extra 12.5 mg for episodes of sustained palpitations.  We reviewed triggers for atrial fibrillation.  I will plan follow-up in 3 to 4 months.  She presents to the clinic 08/19/22 for follow-up evaluation and stated her blood pressure had been elevated in the 150s-160 systolic at home.  She continued to work on physical therapy movements for her right side.  She was regaining sensation in her right arm.  She reported that she would have a CT which was scheduled for the 31st.  We reviewed need for coronary CTA.  She wished to defer at this time due to not being able to drive.  I stopped her losartan and started valsartan 80 mg.  I planned repeat BMP in 2 weeks, and  follow-up in 2 to 3 months.  She presents to the clinic today for follow-up evaluation and states***.  Today she denies chest pain, shortness of breath, lower extremity edema, fatigue, palpitations, melena, hematuria, hemoptysis, diaphoresis, weakness, presyncope, syncope, orthopnea, and PND.   Home Medications    Prior to Admission medications   Medication Sig Start Date End Date Taking? Authorizing Provider  amLODipine (NORVASC) 5 MG tablet Take 1 tablet (5 mg total) by mouth daily. 02/28/22   Anita Olszewski, MD  apixaban (ELIQUIS) 5 MG TABS tablet  Take 1 tablet (5 mg total) by mouth 2 (two) times daily. 02/28/22   Anita Olszewski, MD  atorvastatin (LIPITOR) 40 MG tablet Take 1 tablet (40 mg total) by mouth daily. 02/28/22   Anita Olszewski, MD  diclofenac Sodium (VOLTAREN) 1 % GEL Apply 2 g topically 4 (four) times daily. 02/13/22   Love, Evlyn Kanner, PA-C  fluticasone (FLONASE) 50 MCG/ACT nasal spray Use 1 spray in each nostril once daily. 02/14/22   Love, Evlyn Kanner, PA-C  gabapentin (NEURONTIN) 100 MG capsule Take 1 capsule (100 mg total) by mouth 3 (three) times daily. 04/03/22   Anita Olszewski, MD  lidocaine (XYLOCAINE) 5 % ointment Apply 1 Application topically as needed. 04/03/22   Anita Olszewski, MD  loratadine (CLARITIN) 10 MG tablet Take 1 tablet (10 mg total) by mouth daily. 03/18/22   Jon Billings,  Johnella Moloney, MD  losartan (COZAAR) 100 MG tablet Take 1 tablet (100 mg total) by mouth daily. 04/03/22   Anita Olszewski, MD  melatonin 5 MG TABS Take 1 tablet (5 mg total) by mouth at bedtime. 02/28/22   Anita Olszewski, MD  metoprolol tartrate (LOPRESSOR) 25 MG tablet Take 25 mg by mouth daily. 04/23/22   [provider]  potassium chloride (KLOR-CON M) 10 MEQ tablet Take 1 tablet (10 mEq total) by mouth daily. 03/18/22   Anita Olszewski, MD  senna-docusate (SENOKOT-S) 8.6-50 MG tablet Take 1 tablet by mouth 2 (two) times daily. 02/28/22   Anita Olszewski, MD  ticagrelor (BRILINTA) 90 MG TABS tablet Take 1 tablet (90 mg total) by mouth 2 (two) times daily. 02/28/22   Anita Olszewski, MD  DULoxetine (CYMBALTA) 30 MG capsule Take 30 mg by mouth daily. Then increase to two 30 mg tablets (total 60 mg) by mouth once daily  04/17/22  [provider]    Family History    Family History  Problem Relation Age of Onset   Alcoholism Father    Arrhythmia Brother    Diabetes Brother    Mental illness Brother    She indicated that her mother is deceased. She indicated that her father is deceased. She indicated that only one of her two  brothers is alive.  Social History    Social History   Socioeconomic History   Marital status: Single    Spouse name: Not on file   Number of children: 0   Years of education: Not on file   Highest education level: Not on file  Occupational History   Occupation: Resume building  Tobacco Use   Smoking status: Never   Smokeless tobacco: Never  Vaping Use   Vaping status: Never Used  Substance and Sexual Activity   Alcohol use: No   Drug use: No   Sexual activity: Not on file  Other Topics Concern   Not on file  Social History Narrative   Not on file   Social Determinants of Health   Financial Resource Strain: Low Risk  (08/22/2022)   Overall Financial Resource Strain (CARDIA)    Difficulty of Paying Living Expenses: Not hard at all  Food Insecurity: No Food Insecurity (08/22/2022)   Hunger Vital Sign    Worried About Running Out of Food in the Last Year: Never true    Ran Out of Food in the Last Year: Never true  Transportation Needs: No Transportation Needs (03/18/2022)   PRAPARE - Administrator, Civil Service (Medical): No    Lack of Transportation (Non-Medical): No  Physical Activity: Not on file  Stress: Not on file  Social Connections: Not on file  Intimate Partner Violence: Not At Risk (01/29/2022)   Humiliation, Afraid, Rape, and Kick questionnaire    Fear of Current or Ex-Partner: No    Emotionally Abused: No    Physically Abused: No    Sexually Abused: No     Review of Systems    General:  No chills, fever, night sweats or weight changes.  Cardiovascular:  No chest pain, dyspnea on exertion, edema, orthopnea, palpitations, paroxysmal nocturnal dyspnea. Dermatological: No rash, lesions/masses Respiratory: No cough, dyspnea Urologic: No hematuria, dysuria Abdominal:   No nausea, vomiting, diarrhea, bright red blood per rectum, melena, or hematemesis Neurologic:  No visual changes, wkns, changes in mental status. All other systems reviewed and  are otherwise negative except as noted above.  Physical Exam    VS:  There were no vitals taken for this visit. , BMI There is no height or weight on file to calculate BMI. GEN: Well nourished, well developed, in no acute distress. HEENT: normal. Neck: Supple, no JVD, carotid bruits, or masses. Cardiac: RRR, no murmurs, rubs, or gallops. No clubbing, cyanosis, edema.  Radials/DP/PT 2+ and equal bilaterally.  Respiratory:  Respirations regular and unlabored, clear to auscultation bilaterally. GI: Soft, nontender, nondistended, BS + x 4. MS: no deformity or atrophy. Skin: warm and dry, no rash. Neuro:  Strength and sensation are intact. Psych: Normal affect.  Accessory Clinical Findings    Recent Labs: 03/11/2022: Hemoglobin 13.3; Platelets 264 07/09/2022: ALT 16; BUN 12; Creatinine, Ser 0.81; Magnesium 2.0; Potassium 3.5; Sodium 142   Recent Lipid Panel    Component Value Date/Time   CHOL 226 (H) 01/28/2022 0319   TRIG 253 (H) 01/28/2022 0319   HDL 35 (L) 01/28/2022 0319   CHOLHDL 6.5 01/28/2022 0319   VLDL 51 (H) 01/28/2022 0319   LDLCALC 140 (H) 01/28/2022 0319    No BP recorded.  {Refresh Note OR Click here to enter BP  :1}***    ECG personally reviewed by me today-none today.  Echocardiogram 01/28/2022 IMPRESSIONS     1. Limited study.   2. Left ventricular ejection fraction, by estimation, is 70 to 75%. The  left ventricle has hyperdynamic function. 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 Left ventricular diastolic  function could not be evaluated.   3. The mitral valve is abnormal, mild to moderate annular calcification.   4. The aortic valve is tricuspid. Aortic valve sclerosis is present, with  no evidence of aortic valve stenosis.   5. The inferior vena cava is normal in size with greater than 50%  respiratory variability, suggesting right atrial pressure of 3 mmHg.   FINDINGS   Left Ventricle: Left ventricular  ejection fraction, by estimation, is 70  to 75%. The left ventricle has hyperdynamic function. The left ventricular  internal cavity size was normal in size. There is severe asymmetric left  ventricular hypertrophy of the  basal segment. Left ventricular diastolic function could not be evaluated.   Pericardium: There is no evidence of pericardial effusion. Presence of  epicardial fat layer.   Mitral Valve: The mitral valve is abnormal. Mild to moderate mitral  annular calcification.   Tricuspid Valve: The tricuspid valve is grossly normal.   Aortic Valve: The aortic valve is tricuspid. There is mild aortic valve  annular calcification. Aortic valve sclerosis is present, with no evidence  of aortic valve stenosis.   Aorta: The aortic root is normal in size and structure.   Venous: The inferior vena cava is normal in size with greater than 50%  respiratory variability, suggesting right atrial pressure of 3 mmHg.   IAS/Shunts: The interatrial septum was not assessed.    Nuclear stress test 12/17/2021    Findings are consistent with ischemia. The study is low-intermediate risk.   No ST deviation was noted. ECG rhythm shows sinus bradycardia at rest.   LV perfusion is abnormal. There is evidence of ischemia. Defect 1: There is a small defect with moderate reduction in uptake present in the apical to mid anterior location(s) that is partially reversible (see graphic). There is abnormal wall motion in the defect area. Consistent with ischemia given anteroapical wall motion abnormality.   Left ventricular function is grossly normal. Nuclear stress EF: 50 %, however visually  appears closer to 60%.  End diastolic cavity size is normal. End systolic cavity size is normal.   Prior study not available for comparison.  Assessment & Plan   1.  Paroxysmal atrial fibrillation-heart rate today 5***7 bpm.  Denies recent trauma or falls.  Compliant with apixaban and denies bleeding issues.   Denies  sustained irregular heartbeats or palpitations. Continue  apixaban, metoprolol 12.5 mg daily.   Avoid triggers caffeine, chocolate, EtOH, dehydration etc.-reviewed  Essential hypertension-BP today 152/***86.   Continue   amlodipine, metoprolol Continue valsartan 80 mg Maintain blood pressure log-reviewed Order BMP  Abnormal cardiac stress test-no chest pain or exertional chest discomfort noted.  Previously she was noted to have mild anterior defect on nuclear stress testing.  It was felt that this may be related to artifact.  CCTA was ordered for further evaluation but not completed.  She had 2 strokes towards the end of 2023. Her stroke was October 30 and post CVA she underwent  carotid stenting and had second CVA she is right side affected.    Previously she wished to defer coronary CTA due to inability/driving restrictions. Order coronary CTA***  Disposition: Follow-up with Anita Fletcher or me in 4-6 months.   Thomasene Ripple. Londyn Hotard NP-C     12/15/2022, 3:24 PM  [facility] [address] Office [phone] Fax [phone]    I spent 14*** minutes examining this patient, reviewing medications, and using patient centered shared decision making involving her cardiac care.  Prior to her visit I spent greater than 20 minutes reviewing her past medical history,  medications, and prior cardiac tests.

## 2022-12-17 ENCOUNTER — Ambulatory Visit: Payer: Medicare HMO | Admitting: General Practice

## 2022-12-22 DIAGNOSIS — H903 Sensorineural hearing loss, bilateral: Secondary | ICD-10-CM | POA: Diagnosis not present

## 2022-12-28 ENCOUNTER — Other Ambulatory Visit: Payer: Self-pay | Admitting: Internal Medicine

## 2022-12-28 DIAGNOSIS — E876 Hypokalemia: Secondary | ICD-10-CM

## 2023-01-26 ENCOUNTER — Ambulatory Visit: Payer: Medicare HMO | Admitting: General Practice

## 2023-02-15 NOTE — Progress Notes (Deleted)
Cardiology Clinic Note   Patient Name: Anita Fletcher Date of Encounter: 02/15/2023  Primary Care Provider:  Lula Olszewski, MD Primary Cardiologist:  Reatha Harps, MD  Patient Profile    Anita Fletcher 77 year old female presents the clinic today for follow-up evaluation of her paroxysmal atrial fibrillation and hypertension.  Past Medical History    Past Medical History:  Diagnosis Date   Acute CVA (cerebrovascular accident) (HCC) 01/27/2022   Arm pain 07/09/2022   Started around mid March woke her up feels like broken arm in the right midshaft humerus feels like muscle pain feels like tightness in the shoulder feels like numbness in the hand Is not clear whether moving the arm aggravates the pain she says it does not hurt more but it does not help it She has not been able to identify anything that relieves the pain or triggers the pain The pain is like a   Hearing loss 07/10/2022   Started to be problematic a couple months after cerebrovascular accident   HTN (hypertension) 01/27/2022   Leg swelling 06/18/2022   Ms.Demas noticed a few weeks ago. Patient complains of numbness sensation without loss of sensation I closely evaluated her legs and she has bilateral mild pitting edema 2+ with increased sensation of edema on the right leg but I cannot really tell the difference.  There is no tenderness in her calves there is excellent circulation and pulse on bilateral dorsal pedis and posterior tibialis, excell   Neuropathic pain 04/03/2022   She is a lot more pain in her right hand that is nerve pain so I suspect she is recovering some from her stroke but I advised that she increase her gabapentin it even though she rather avoid that because of the drowsiness am sending in a higher dose so that she at least has it available for use if the pain gets too much.  I okayed her to return to work.  I offered to send her to physical therapy a   Neuropathy 06/18/2022   Sensation of  numbness R leg only First noticed with swelling 05/2022, seemed disconnected to her CVA October to November. 2023     PAF (paroxysmal atrial fibrillation) (HCC)    TIA (transient ischemic attack)    Past Surgical History:  Procedure Laterality Date   EYE SURGERY     IR ANGIO INTRA EXTRACRAN SEL COM CAROTID INNOMINATE BILAT MOD SED  01/30/2022   IR ANGIO VERTEBRAL SEL VERTEBRAL UNI L MOD SED  01/30/2022   IR ANGIO VERTEBRAL SEL VERTEBRAL UNI L MOD SED  01/31/2022   IR ANGIOGRAM FOLLOW UP STUDY  01/31/2022   IR CT HEAD LTD  01/31/2022   IR INTRA CRAN STENT  01/31/2022   IR US GUIDE VASC ACCESS LEFT  01/31/2022   IR US GUIDE VASC ACCESS RIGHT  01/30/2022   RADIOLOGY WITH ANESTHESIA N/A 01/30/2022   Procedure: MRI WITH ANESTHESIA - BRAIN W/O CONSTRAST;  Surgeon: Radiologist, Medication, MD;  Location: [facility] OR;  Service: Radiology;  Laterality: N/A;   RADIOLOGY WITH ANESTHESIA N/A 01/31/2022   Procedure: RADIOLOGY WITH ANESTHESIA;  Surgeon: Radiologist, Medication, MD;  Location: [facility] OR;  Service: Radiology;  Laterality: N/A;    Allergies  Allergies  Allergen Reactions   Codeine     History of Present Illness    Anita Fletcher has a PMH of paroxysmal atrial fibrillation hypertension, CVA with residual deficit, hypokalemia, insomnia, constipation, and neuropathic pain.  CHA2DS2-VASc score 3 (age,  hypertension, female).  Echocardiogram showed normal LV function.  She was seen in follow-up by Dr. Flora Lipps on 01/16/2022.  During that time there were concerns for anterior ischemia on her nuclear medicine stress test.  Her previous monitor showed no recurrent atrial fibrillation.  Her blood pressure was elevated at 160/100.  She indicated that her blood pressure was also elevated at home.  She denied recurrent atrial fibrillation/palpitations.  She was using a [place] mobile device.  She denied chest pain and trouble with her breathing.  She did not have formal exercise routine.  She reported compliance with  apixaban and denied bleeding issues.  A BMP and coronary CTA were ordered but not completed.  Losartan 50 mg daily was also prescribed.  She presented to the clinic 05/16/22 for follow-up evaluation and stated she had two CVA's.  Her first CVA was October 30.  She was undergoing carotid endarterectomy and had a second CVA during the procedure.  She was then sent to rehab and is not driving at this time.  She was unable to complete coronary CTA.  We reviewed her previous stress testing and she expressed understanding.  She did not feel that she was taking metoprolol and had noted episodes of atrial fibrillation.  I started metoprolol tartrate 12.5 mg daily and  instructed her that she may take an extra 12.5 mg for episodes of sustained palpitations.  We reviewed triggers for atrial fibrillation.  I will plan follow-up in 3 to 4 months.  She presents to the clinic today for follow-up evaluation and states her blood pressure has been elevated in the 150s-160 systolic at home.  She continues to work on physical therapy movements for her right side.  She is regaining sensation in her right arm.  She reports that she will have a CT which is scheduled for the 31st.  We reviewed need for coronary CTA.  She wishes to defer at this time due to not being able to drive.  I will stop her losartan and start valsartan 80 mg.  We will repeat BMP in 2 weeks, give salty 6 diet she, and plan follow-up in 2 to 3 months.  Today she denies chest pain, shortness of breath, lower extremity edema, fatigue, palpitations, melena, hematuria, hemoptysis, diaphoresis, weakness, presyncope, syncope, orthopnea, and PND.   Home Medications    Prior to Admission medications   Medication Sig Start Date End Date Taking? Authorizing Provider  amLODipine (NORVASC) 5 MG tablet Take 1 tablet (5 mg total) by mouth daily. 02/28/22   Lula Olszewski, MD  apixaban (ELIQUIS) 5 MG TABS tablet Take 1 tablet (5 mg total) by mouth 2 (two) times daily.  02/28/22   Lula Olszewski, MD  atorvastatin (LIPITOR) 40 MG tablet Take 1 tablet (40 mg total) by mouth daily. 02/28/22   Lula Olszewski, MD  diclofenac Sodium (VOLTAREN) 1 % GEL Apply 2 g topically 4 (four) times daily. 02/13/22   Love, Evlyn Kanner, PA-C  fluticasone (FLONASE) 50 MCG/ACT nasal spray Use 1 spray in each nostril once daily. 02/14/22   Love, Evlyn Kanner, PA-C  gabapentin (NEURONTIN) 100 MG capsule Take 1 capsule (100 mg total) by mouth 3 (three) times daily. 04/03/22   Lula Olszewski, MD  lidocaine (XYLOCAINE) 5 % ointment Apply 1 Application topically as needed. 04/03/22   Lula Olszewski, MD  loratadine (CLARITIN) 10 MG tablet Take 1 tablet (10 mg total) by mouth daily. 03/18/22   Lula Olszewski, MD  losartan Lynelle Smoke)  100 MG tablet Take 1 tablet (100 mg total) by mouth daily. 04/03/22   Lula Olszewski, MD  melatonin 5 MG TABS Take 1 tablet (5 mg total) by mouth at bedtime. 02/28/22   Lula Olszewski, MD  metoprolol tartrate (LOPRESSOR) 25 MG tablet Take 25 mg by mouth daily. 04/23/22   [provider]  potassium chloride (KLOR-CON M) 10 MEQ tablet Take 1 tablet (10 mEq total) by mouth daily. 03/18/22   Lula Olszewski, MD  senna-docusate (SENOKOT-S) 8.6-50 MG tablet Take 1 tablet by mouth 2 (two) times daily. 02/28/22   Lula Olszewski, MD  ticagrelor (BRILINTA) 90 MG TABS tablet Take 1 tablet (90 mg total) by mouth 2 (two) times daily. 02/28/22   Lula Olszewski, MD  DULoxetine (CYMBALTA) 30 MG capsule Take 30 mg by mouth daily. Then increase to two 30 mg tablets (total 60 mg) by mouth once daily  04/17/22  [provider]    Family History    Family History  Problem Relation Age of Onset   Alcoholism Father    Arrhythmia Brother    Diabetes Brother    Mental illness Brother    She indicated that her mother is deceased. She indicated that her father is deceased. She indicated that only one of her two brothers is alive.  Social History    Social History    Socioeconomic History   Marital status: Single    Spouse name: Not on file   Number of children: 0   Years of education: Not on file   Highest education level: Not on file  Occupational History   Occupation: Resume building  Tobacco Use   Smoking status: Never   Smokeless tobacco: Never  Vaping Use   Vaping status: Never Used  Substance and Sexual Activity   Alcohol use: No   Drug use: No   Sexual activity: Not on file  Other Topics Concern   Not on file  Social History Narrative   Not on file   Social Determinants of Health   Financial Resource Strain: Low Risk  (08/22/2022)   Overall Financial Resource Strain (CARDIA)    Difficulty of Paying Living Expenses: Not hard at all  Food Insecurity: No Food Insecurity (08/22/2022)   Hunger Vital Sign    Worried About Running Out of Food in the Last Year: Never true    Ran Out of Food in the Last Year: Never true  Transportation Needs: No Transportation Needs (03/18/2022)   PRAPARE - Administrator, Civil Service (Medical): No    Lack of Transportation (Non-Medical): No  Physical Activity: Not on file  Stress: Not on file  Social Connections: Not on file  Intimate Partner Violence: Not At Risk (01/29/2022)   Humiliation, Afraid, Rape, and Kick questionnaire    Fear of Current or Ex-Partner: No    Emotionally Abused: No    Physically Abused: No    Sexually Abused: No     Review of Systems    General:  No chills, fever, night sweats or weight changes.  Cardiovascular:  No chest pain, dyspnea on exertion, edema, orthopnea, palpitations, paroxysmal nocturnal dyspnea. Dermatological: No rash, lesions/masses Respiratory: No cough, dyspnea Urologic: No hematuria, dysuria Abdominal:   No nausea, vomiting, diarrhea, bright red blood per rectum, melena, or hematemesis Neurologic:  No visual changes, wkns, changes in mental status. All other systems reviewed and are otherwise negative except as noted  above.  Physical Exam    VS:  There were no vitals taken for this visit. , BMI There is no height or weight on file to calculate BMI. GEN: Well nourished, well developed, in no acute distress. HEENT: normal. Neck: Supple, no JVD, carotid bruits, or masses. Cardiac: RRR, no murmurs, rubs, or gallops. No clubbing, cyanosis, edema.  Radials/DP/PT 2+ and equal bilaterally.  Respiratory:  Respirations regular and unlabored, clear to auscultation bilaterally. GI: Soft, nontender, nondistended, BS + x 4. MS: no deformity or atrophy. Skin: warm and dry, no rash. Neuro:  Strength and sensation are intact. Psych: Normal affect.  Accessory Clinical Findings    Recent Labs: 03/11/2022: Hemoglobin 13.3; Platelets 264 07/09/2022: ALT 16; BUN 12; Creatinine, Ser 0.81; Magnesium 2.0; Potassium 3.5; Sodium 142   Recent Lipid Panel    Component Value Date/Time   CHOL 226 (H) 01/28/2022 0319   TRIG 253 (H) 01/28/2022 0319   HDL 35 (L) 01/28/2022 0319   CHOLHDL 6.5 01/28/2022 0319   VLDL 51 (H) 01/28/2022 0319   LDLCALC 140 (H) 01/28/2022 0319    No BP recorded.  {Refresh Note OR Click here to enter BP  :1}***    ECG personally reviewed by me today-none today.  Echocardiogram 01/28/2022 IMPRESSIONS     1. Limited study.   2. Left ventricular ejection fraction, by estimation, is 70 to 75%. The  left ventricle has hyperdynamic function. 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 Left ventricular diastolic  function could not be evaluated.   3. The mitral valve is abnormal, mild to moderate annular calcification.   4. The aortic valve is tricuspid. Aortic valve sclerosis is present, with  no evidence of aortic valve stenosis.   5. The inferior vena cava is normal in size with greater than 50%  respiratory variability, suggesting right atrial pressure of 3 mmHg.   FINDINGS   Left Ventricle: Left ventricular ejection fraction, by estimation, is 70  to  75%. The left ventricle has hyperdynamic function. The left ventricular  internal cavity size was normal in size. There is severe asymmetric left  ventricular hypertrophy of the  basal segment. Left ventricular diastolic function could not be evaluated.   Pericardium: There is no evidence of pericardial effusion. Presence of  epicardial fat layer.   Mitral Valve: The mitral valve is abnormal. Mild to moderate mitral  annular calcification.   Tricuspid Valve: The tricuspid valve is grossly normal.   Aortic Valve: The aortic valve is tricuspid. There is mild aortic valve  annular calcification. Aortic valve sclerosis is present, with no evidence  of aortic valve stenosis.   Aorta: The aortic root is normal in size and structure.   Venous: The inferior vena cava is normal in size with greater than 50%  respiratory variability, suggesting right atrial pressure of 3 mmHg.   IAS/Shunts: The interatrial septum was not assessed.    Nuclear stress test 12/17/2021    Findings are consistent with ischemia. The study is low-intermediate risk.   No ST deviation was noted. ECG rhythm shows sinus bradycardia at rest.   LV perfusion is abnormal. There is evidence of ischemia. Defect 1: There is a small defect with moderate reduction in uptake present in the apical to mid anterior location(s) that is partially reversible (see graphic). There is abnormal wall motion in the defect area. Consistent with ischemia given anteroapical wall motion abnormality.   Left ventricular function is grossly normal. Nuclear stress EF: 50 %, however visually appears closer to 60%.  End diastolic  cavity size is normal. End systolic cavity size is normal.   Prior study not available for comparison.  Assessment & Plan   1.  Paroxysmal atrial fibrillation-heart rate today 57 bpm.  Reports continued compliance with apixaban and denies bleeding issues.   Denies sustained irregular heartbeats or palpitations. Continue   apixaban, metoprolol 12.5 mg daily.  May take an extra 12.5 mg for sustained palpitations.-Reviewed Heart healthy low-sodium diet-continue Avoid triggers caffeine, chocolate, EtOH, dehydration etc.-reviewed  Essential hypertension-BP today 152/86.   Continue   amlodipine, metoprolol Order valsartan 80 mg Maintain blood pressure log BMP in 2 wks   Abnormal cardiac stress test-denies recent episodes of chest discomfort.  Previously she was noted to have mild anterior defect on nuclear stress testing.  It was felt that this may be related to artifact.  CCTA was ordered for further evaluation but not completed.  Of note, she had 2 strokes towards the end of 2023. Her stroke was October 30 and post CVA she underwent  carotid stenting and had second CVA she is right side affected.    Previously she wished to defer coronary CTA. Does not want to do cta at this time d/t not being able to drive.   Disposition: Follow-up with Dr. Flora Lipps or me in 2-3 months.   Thomasene Ripple. Laveah Gloster NP-C     02/15/2023, 11:45 AM [facility] [address] Office [phone] Fax [phone]    I spent 14*** minutes examining this patient, reviewing medications, and using patient centered shared decision making involving her cardiac care.  Prior to her visit I spent greater than 20 minutes reviewing her past medical history,  medications, and prior cardiac tests.

## 2023-02-18 ENCOUNTER — Ambulatory Visit: Payer: Medicare HMO | Admitting: General Practice

## 2023-04-02 ENCOUNTER — Other Ambulatory Visit: Payer: Self-pay | Admitting: Internal Medicine

## 2023-04-02 DIAGNOSIS — I480 Paroxysmal atrial fibrillation: Secondary | ICD-10-CM

## 2023-04-07 NOTE — Progress Notes (Deleted)
 Cardiology Clinic Note   Patient Name: Anita Fletcher Date of Encounter: 04/07/2023  Primary Care Provider:  Jesus Bernardino KANDICE, MD Primary Cardiologist:  Darryle ONEIDA Decent, MD  Patient Profile    Anita Fletcher 78 year old female presents the clinic today for follow-up evaluation of her paroxysmal atrial fibrillation and hypertension.  Past Medical History    Past Medical History:  Diagnosis Date   Acute CVA (cerebrovascular accident) (HCC) 01/27/2022   Arm pain 07/09/2022   Started around mid March woke her up feels like broken arm in the right midshaft humerus feels like muscle pain feels like tightness in the shoulder feels like numbness in the hand Is not clear whether moving the arm aggravates the pain she says it does not hurt more but it does not help it She has not been able to identify anything that relieves the pain or triggers the pain The pain is like a   Hearing loss 07/10/2022   Started to be problematic a couple months after cerebrovascular accident   HTN (hypertension) 01/27/2022   Leg swelling 06/18/2022   Ms.Grunder noticed a few weeks ago. Patient complains of numbness sensation without loss of sensation I closely evaluated her legs and she has bilateral mild pitting edema 2+ with increased sensation of edema on the right leg but I cannot really tell the difference.  There is no tenderness in her calves there is excellent circulation and pulse on bilateral dorsal pedis and posterior tibialis, excell   Neuropathic pain 04/03/2022   She is a lot more pain in her right hand that is nerve pain so I suspect she is recovering some from her stroke but I advised that she increase her gabapentin  it even though she rather avoid that because of the drowsiness am sending in a higher dose so that she at least has it available for use if the pain gets too much.  I okayed her to return to work.  I offered to send her to physical therapy a   Neuropathy 06/18/2022   Sensation of numbness  R leg only First noticed with swelling 05/2022, seemed disconnected to her CVA October to November. 2023     PAF (paroxysmal atrial fibrillation) (HCC)    TIA (transient ischemic attack)    Past Surgical History:  Procedure Laterality Date   EYE SURGERY     IR ANGIO INTRA EXTRACRAN SEL COM CAROTID INNOMINATE BILAT MOD SED  01/30/2022   IR ANGIO VERTEBRAL SEL VERTEBRAL UNI L MOD SED  01/30/2022   IR ANGIO VERTEBRAL SEL VERTEBRAL UNI L MOD SED  01/31/2022   IR ANGIOGRAM FOLLOW UP STUDY  01/31/2022   IR CT HEAD LTD  01/31/2022   IR INTRA CRAN STENT  01/31/2022   IR US  GUIDE VASC ACCESS LEFT  01/31/2022   IR US  GUIDE VASC ACCESS RIGHT  01/30/2022   RADIOLOGY WITH ANESTHESIA N/A 01/30/2022   Procedure: MRI WITH ANESTHESIA - BRAIN W/O CONSTRAST;  Surgeon: Radiologist, Medication, MD;  Location: [facility] OR;  Service: Radiology;  Laterality: N/A;   RADIOLOGY WITH ANESTHESIA N/A 01/31/2022   Procedure: RADIOLOGY WITH ANESTHESIA;  Surgeon: Radiologist, Medication, MD;  Location: [facility] OR;  Service: Radiology;  Laterality: N/A;    Allergies  Allergies  Allergen Reactions   Codeine     History of Present Illness    Anita Fletcher has a PMH of paroxysmal atrial fibrillation hypertension, CVA with residual deficit, hypokalemia, insomnia, constipation, and neuropathic pain.  CHA2DS2-VASc score 3 (age,  hypertension, female).  Echocardiogram showed normal LV function.  She was seen in follow-up by Dr. Barbaraann on 01/16/2022.  During that time there were concerns for anterior ischemia on her nuclear medicine stress test.  Her previous monitor showed no recurrent atrial fibrillation.  Her blood pressure was elevated at 160/100.  She indicated that her blood pressure was also elevated at home.  She denied recurrent atrial fibrillation/palpitations.  She was using a Kardia mobile device.  She denied chest pain and trouble with her breathing.  She did not have formal exercise routine.  She reported compliance with apixaban   and denied bleeding issues.  A BMP and coronary CTA were ordered but not completed.  Losartan  50 mg daily was also prescribed.  She presented to the clinic 05/16/22 for follow-up evaluation and stated she had two CVA's.  Her first CVA was October 30.  She was undergoing carotid endarterectomy and had a second CVA during the procedure.  She was then sent to rehab and is not driving at this time.  She was unable to complete coronary CTA.  We reviewed her previous stress testing and she expressed understanding.  She did not feel that she was taking metoprolol  and had noted episodes of atrial fibrillation.  I started metoprolol  tartrate 12.5 mg daily and  instructed her that she may take an extra 12.5 mg for episodes of sustained palpitations.  We reviewed triggers for atrial fibrillation.  I will plan follow-up in 3 to 4 months.  She presents to the clinic today for follow-up evaluation and states her blood pressure has been elevated in the 150s-160 systolic at home.  She continues to work on physical therapy movements for her right side.  She is regaining sensation in her right arm.  She reports that she will have a CT which is scheduled for the 31st.  We reviewed need for coronary CTA.  She wishes to defer at this time due to not being able to drive.  I will stop her losartan  and start valsartan  80 mg.  We will repeat BMP in 2 weeks, give salty 6 diet she, and plan follow-up in 2 to 3 months.  Today she denies chest pain, shortness of breath, lower extremity edema, fatigue, palpitations, melena, hematuria, hemoptysis, diaphoresis, weakness, presyncope, syncope, orthopnea, and PND.   Home Medications    Prior to Admission medications   Medication Sig Start Date End Date Taking? Authorizing Provider  amLODipine  (NORVASC ) 5 MG tablet Take 1 tablet (5 mg total) by mouth daily. 02/28/22   Jesus Bernardino MATSU, MD  apixaban  (ELIQUIS ) 5 MG TABS tablet Take 1 tablet (5 mg total) by mouth 2 (two) times daily. 02/28/22    Jesus Bernardino MATSU, MD  atorvastatin  (LIPITOR) 40 MG tablet Take 1 tablet (40 mg total) by mouth daily. 02/28/22   Jesus Bernardino MATSU, MD  diclofenac  Sodium (VOLTAREN ) 1 % GEL Apply 2 g topically 4 (four) times daily. 02/13/22   Love, Sharlet RAMAN, PA-C  fluticasone  (FLONASE ) 50 MCG/ACT nasal spray Use 1 spray in each nostril once daily. 02/14/22   Love, Sharlet RAMAN, PA-C  gabapentin  (NEURONTIN ) 100 MG capsule Take 1 capsule (100 mg total) by mouth 3 (three) times daily. 04/03/22   Jesus Bernardino MATSU, MD  lidocaine  (XYLOCAINE ) 5 % ointment Apply 1 Application topically as needed. 04/03/22   Jesus Bernardino MATSU, MD  loratadine  (CLARITIN ) 10 MG tablet Take 1 tablet (10 mg total) by mouth daily. 03/18/22   Jesus Bernardino MATSU, MD  losartan  (COZAAR )  100 MG tablet Take 1 tablet (100 mg total) by mouth daily. 04/03/22   Jesus Bernardino MATSU, MD  melatonin 5 MG TABS Take 1 tablet (5 mg total) by mouth at bedtime. 02/28/22   Jesus Bernardino MATSU, MD  metoprolol  tartrate (LOPRESSOR ) 25 MG tablet Take 25 mg by mouth daily. 04/23/22   [provider]  potassium chloride  (KLOR-CON  M) 10 MEQ tablet Take 1 tablet (10 mEq total) by mouth daily. 03/18/22   Jesus Bernardino MATSU, MD  senna-docusate (SENOKOT-S) 8.6-50 MG tablet Take 1 tablet by mouth 2 (two) times daily. 02/28/22   Jesus Bernardino MATSU, MD  ticagrelor  (BRILINTA ) 90 MG TABS tablet Take 1 tablet (90 mg total) by mouth 2 (two) times daily. 02/28/22   Jesus Bernardino MATSU, MD  DULoxetine  (CYMBALTA ) 30 MG capsule Take 30 mg by mouth daily. Then increase to two 30 mg tablets (total 60 mg) by mouth once daily  04/17/22  [provider]    Family History    Family History  Problem Relation Age of Onset   Alcoholism Father    Arrhythmia Brother    Diabetes Brother    Mental illness Brother    She indicated that her mother is deceased. She indicated that her father is deceased. She indicated that only one of her two brothers is alive.  Social History    Social History    Socioeconomic History   Marital status: Single    Spouse name: Not on file   Number of children: 0   Years of education: Not on file   Highest education level: Not on file  Occupational History   Occupation: Resume building  Tobacco Use   Smoking status: Never   Smokeless tobacco: Never  Vaping Use   Vaping status: Never Used  Substance and Sexual Activity   Alcohol use: No   Drug use: No   Sexual activity: Not on file  Other Topics Concern   Not on file  Social History Narrative   Not on file   Social Drivers of Health   Financial Resource Strain: Low Risk  (08/22/2022)   Overall Financial Resource Strain (CARDIA)    Difficulty of Paying Living Expenses: Not hard at all  Food Insecurity: No Food Insecurity (08/22/2022)   Hunger Vital Sign    Worried About Running Out of Food in the Last Year: Never true    Ran Out of Food in the Last Year: Never true  Transportation Needs: No Transportation Needs (03/18/2022)   PRAPARE - Administrator, Civil Service (Medical): No    Lack of Transportation (Non-Medical): No  Physical Activity: Not on file  Stress: Not on file  Social Connections: Not on file  Intimate Partner Violence: Not At Risk (01/29/2022)   Humiliation, Afraid, Rape, and Kick questionnaire    Fear of Current or Ex-Partner: No    Emotionally Abused: No    Physically Abused: No    Sexually Abused: No     Review of Systems    General:  No chills, fever, night sweats or weight changes.  Cardiovascular:  No chest pain, dyspnea on exertion, edema, orthopnea, palpitations, paroxysmal nocturnal dyspnea. Dermatological: No rash, lesions/masses Respiratory: No cough, dyspnea Urologic: No hematuria, dysuria Abdominal:   No nausea, vomiting, diarrhea, bright red blood per rectum, melena, or hematemesis Neurologic:  No visual changes, wkns, changes in mental status. All other systems reviewed and are otherwise negative except as noted above.  Physical  Exam    VS:  There were no vitals taken for this visit. , BMI There is no height or weight on file to calculate BMI. GEN: Well nourished, well developed, in no acute distress. HEENT: normal. Neck: Supple, no JVD, carotid bruits, or masses. Cardiac: RRR, no murmurs, rubs, or gallops. No clubbing, cyanosis, edema.  Radials/DP/PT 2+ and equal bilaterally.  Respiratory:  Respirations regular and unlabored, clear to auscultation bilaterally. GI: Soft, nontender, nondistended, BS + x 4. MS: no deformity or atrophy. Skin: warm and dry, no rash. Neuro:  Strength and sensation are intact. Psych: Normal affect.  Accessory Clinical Findings    Recent Labs: 07/09/2022: ALT 16; BUN 12; Creatinine, Ser 0.81; Magnesium 2.0; Potassium 3.5; Sodium 142   Recent Lipid Panel    Component Value Date/Time   CHOL 226 (H) 01/28/2022 0319   TRIG 253 (H) 01/28/2022 0319   HDL 35 (L) 01/28/2022 0319   CHOLHDL 6.5 01/28/2022 0319   VLDL 51 (H) 01/28/2022 0319   LDLCALC 140 (H) 01/28/2022 0319    No BP recorded.  {Refresh Note OR Click here to enter BP  :1}***    ECG personally reviewed by me today-none today.  Echocardiogram 01/28/2022 IMPRESSIONS     1. Limited study.   2. Left ventricular ejection fraction, by estimation, is 70 to 75%. The  left ventricle has hyperdynamic function. 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 Left ventricular diastolic  function could not be evaluated.   3. The mitral valve is abnormal, mild to moderate annular calcification.   4. The aortic valve is tricuspid. Aortic valve sclerosis is present, with  no evidence of aortic valve stenosis.   5. The inferior vena cava is normal in size with greater than 50%  respiratory variability, suggesting right atrial pressure of 3 mmHg.   FINDINGS   Left Ventricle: Left ventricular ejection fraction, by estimation, is 70  to 75%. The left ventricle has hyperdynamic function. The left  ventricular  internal cavity size was normal in size. There is severe asymmetric left  ventricular hypertrophy of the  basal segment. Left ventricular diastolic function could not be evaluated.   Pericardium: There is no evidence of pericardial effusion. Presence of  epicardial fat layer.   Mitral Valve: The mitral valve is abnormal. Mild to moderate mitral  annular calcification.   Tricuspid Valve: The tricuspid valve is grossly normal.   Aortic Valve: The aortic valve is tricuspid. There is mild aortic valve  annular calcification. Aortic valve sclerosis is present, with no evidence  of aortic valve stenosis.   Aorta: The aortic root is normal in size and structure.   Venous: The inferior vena cava is normal in size with greater than 50%  respiratory variability, suggesting right atrial pressure of 3 mmHg.   IAS/Shunts: The interatrial septum was not assessed.    Nuclear stress test 12/17/2021    Findings are consistent with ischemia. The study is low-intermediate risk.   No ST deviation was noted. ECG rhythm shows sinus bradycardia at rest.   LV perfusion is abnormal. There is evidence of ischemia. Defect 1: There is a small defect with moderate reduction in uptake present in the apical to mid anterior location(s) that is partially reversible (see graphic). There is abnormal wall motion in the defect area. Consistent with ischemia given anteroapical wall motion abnormality.   Left ventricular function is grossly normal. Nuclear stress EF: 50 %, however visually appears closer to 60%.  End diastolic cavity size is normal. End  systolic cavity size is normal.   Prior study not available for comparison.  Assessment & Plan   1.  Paroxysmal atrial fibrillation-heart rate today 57 bpm.  Reports continued compliance with apixaban  and denies bleeding issues.   Denies sustained irregular heartbeats or palpitations. Continue  apixaban , metoprolol  12.5 mg daily.  May take an extra 12.5 mg  for sustained palpitations.-Reviewed Heart healthy low-sodium diet-continue Avoid triggers caffeine, chocolate, EtOH, dehydration etc.-reviewed  Essential hypertension-BP today 152/86.   Continue   amlodipine , metoprolol  Order valsartan  80 mg Maintain blood pressure log BMP in 2 wks   Abnormal cardiac stress test-denies recent episodes of chest discomfort.  Previously she was noted to have mild anterior defect on nuclear stress testing.  It was felt that this may be related to artifact.  CCTA was ordered for further evaluation but not completed.  Of note, she had 2 strokes towards the end of 2023. Her stroke was October 30 and post CVA she underwent  carotid stenting and had second CVA she is right side affected.    Previously she wished to defer coronary CTA. Does not want to do cta at this time d/t not being able to drive.   Disposition: Follow-up with Dr. Barbaraann or me in 2-3 months.   Josefa HERO. Babs Dabbs NP-C     04/07/2023, 1:54 PM [facility] [address] Office [phone] Fax [phone]    I spent 14*** minutes examining this patient, reviewing medications, and using patient centered shared decision making involving her cardiac care.  Prior to her visit I spent greater than 20 minutes reviewing her past medical history,  medications, and prior cardiac tests.

## 2023-04-10 ENCOUNTER — Ambulatory Visit: Payer: Medicare HMO | Admitting: General Practice

## 2023-05-12 ENCOUNTER — Other Ambulatory Visit: Payer: Self-pay | Admitting: General Practice

## 2023-05-16 ENCOUNTER — Other Ambulatory Visit: Payer: Self-pay | Admitting: Internal Medicine

## 2023-05-16 ENCOUNTER — Other Ambulatory Visit: Payer: Self-pay | Admitting: General Practice

## 2023-05-16 DIAGNOSIS — I639 Cerebral infarction, unspecified: Secondary | ICD-10-CM

## 2023-05-25 NOTE — Progress Notes (Deleted)
 Cardiology Clinic Note   Patient Name: Anita Fletcher Date of Encounter: 05/25/2023  Primary Care Provider:  Lula Olszewski, MD Primary Cardiologist:  Reatha Harps, MD  Patient Profile    Anita Fletcher 78 year old female presents the clinic today for follow-up evaluation of her paroxysmal atrial fibrillation and hypertension.  Past Medical History    Past Medical History:  Diagnosis Date   Acute CVA (cerebrovascular accident) (HCC) 01/27/2022   Arm pain 07/09/2022   Started around mid March woke her up feels like broken arm in the right midshaft humerus feels like muscle pain feels like tightness in the shoulder feels like numbness in the hand Is not clear whether moving the arm aggravates the pain she says it does not hurt more but it does not help it She has not been able to identify anything that relieves the pain or triggers the pain The pain is like a   Hearing loss 07/10/2022   Started to be problematic a couple months after cerebrovascular accident   HTN (hypertension) 01/27/2022   Leg swelling 06/18/2022   Ms.Alejo noticed a few weeks ago. Patient complains of numbness sensation without loss of sensation I closely evaluated her legs and she has bilateral mild pitting edema 2+ with increased sensation of edema on the right leg but I cannot really tell the difference.  There is no tenderness in her calves there is excellent circulation and pulse on bilateral dorsal pedis and posterior tibialis, excell   Neuropathic pain 04/03/2022   She is a lot more pain in her right hand that is nerve pain so I suspect she is recovering some from her stroke but I advised that she increase her gabapentin it even though she rather avoid that because of the drowsiness am sending in a higher dose so that she at least has it available for use if the pain gets too much.  I okayed her to return to work.  I offered to send her to physical therapy a   Neuropathy 06/18/2022   Sensation of  numbness R leg only First noticed with swelling 05/2022, seemed disconnected to her CVA October to November. 2023     PAF (paroxysmal atrial fibrillation) (HCC)    TIA (transient ischemic attack)    Past Surgical History:  Procedure Laterality Date   EYE SURGERY     IR ANGIO INTRA EXTRACRAN SEL COM CAROTID INNOMINATE BILAT MOD SED  01/30/2022   IR ANGIO VERTEBRAL SEL VERTEBRAL UNI L MOD SED  01/30/2022   IR ANGIO VERTEBRAL SEL VERTEBRAL UNI L MOD SED  01/31/2022   IR ANGIOGRAM FOLLOW UP STUDY  01/31/2022   IR CT HEAD LTD  01/31/2022   IR INTRA CRAN STENT  01/31/2022   IR US GUIDE VASC ACCESS LEFT  01/31/2022   IR US GUIDE VASC ACCESS RIGHT  01/30/2022   RADIOLOGY WITH ANESTHESIA N/A 01/30/2022   Procedure: MRI WITH ANESTHESIA - BRAIN W/O CONSTRAST;  Surgeon: Radiologist, Medication, MD;  Location: [facility] OR;  Service: Radiology;  Laterality: N/A;   RADIOLOGY WITH ANESTHESIA N/A 01/31/2022   Procedure: RADIOLOGY WITH ANESTHESIA;  Surgeon: Radiologist, Medication, MD;  Location: [facility] OR;  Service: Radiology;  Laterality: N/A;    Allergies  Allergies  Allergen Reactions   Codeine     History of Present Illness    Anita Fletcher has a PMH of paroxysmal atrial fibrillation hypertension, CVA with residual deficit, hypokalemia, insomnia, constipation, and neuropathic pain.  CHA2DS2-VASc score 3 (age,  hypertension, female).  Echocardiogram showed normal LV function.  She was seen in follow-up by Dr. Flora Lipps on 01/16/2022.  During that time there were concerns for anterior ischemia on her nuclear medicine stress test.  Her previous monitor showed no recurrent atrial fibrillation.  Her blood pressure was elevated at 160/100.  She indicated that her blood pressure was also elevated at home.  She denied recurrent atrial fibrillation/palpitations.  She was using a [place] mobile device.  She denied chest pain and trouble with her breathing.  She did not have formal exercise routine.  She reported compliance with  apixaban and denied bleeding issues.  A BMP and coronary CTA were ordered but not completed.  Losartan 50 mg daily was also prescribed.  She presented to the clinic 05/16/22 for follow-up evaluation and stated she had two CVA's.  Her first CVA was October 30.  She was undergoing carotid endarterectomy and had a second CVA during the procedure.  She was then sent to rehab and is not driving at this time.  She was unable to complete coronary CTA.  We reviewed her previous stress testing and she expressed understanding.  She did not feel that she was taking metoprolol and had noted episodes of atrial fibrillation.  I started metoprolol tartrate 12.5 mg daily and  instructed her that she may take an extra 12.5 mg for episodes of sustained palpitations.  We reviewed triggers for atrial fibrillation.  I will plan follow-up in 3 to 4 months.  She presents to the clinic today for follow-up evaluation and states her blood pressure has been elevated in the 150s-160 systolic at home.  She continues to work on physical therapy movements for her right side.  She is regaining sensation in her right arm.  She reports that she will have a CT which is scheduled for the 31st.  We reviewed need for coronary CTA.  She wishes to defer at this time due to not being able to drive.  I will stop her losartan and start valsartan 80 mg.  We will repeat BMP in 2 weeks, give salty 6 diet she, and plan follow-up in 2 to 3 months.  Today she denies chest pain, shortness of breath, lower extremity edema, fatigue, palpitations, melena, hematuria, hemoptysis, diaphoresis, weakness, presyncope, syncope, orthopnea, and PND.   Home Medications    Prior to Admission medications   Medication Sig Start Date End Date Taking? Authorizing Provider  amLODipine (NORVASC) 5 MG tablet Take 1 tablet (5 mg total) by mouth daily. 02/28/22   Lula Olszewski, MD  apixaban (ELIQUIS) 5 MG TABS tablet Take 1 tablet (5 mg total) by mouth 2 (two) times daily.  02/28/22   Lula Olszewski, MD  atorvastatin (LIPITOR) 40 MG tablet Take 1 tablet (40 mg total) by mouth daily. 02/28/22   Lula Olszewski, MD  diclofenac Sodium (VOLTAREN) 1 % GEL Apply 2 g topically 4 (four) times daily. 02/13/22   Love, Evlyn Kanner, PA-C  fluticasone (FLONASE) 50 MCG/ACT nasal spray Use 1 spray in each nostril once daily. 02/14/22   Love, Evlyn Kanner, PA-C  gabapentin (NEURONTIN) 100 MG capsule Take 1 capsule (100 mg total) by mouth 3 (three) times daily. 04/03/22   Lula Olszewski, MD  lidocaine (XYLOCAINE) 5 % ointment Apply 1 Application topically as needed. 04/03/22   Lula Olszewski, MD  loratadine (CLARITIN) 10 MG tablet Take 1 tablet (10 mg total) by mouth daily. 03/18/22   Lula Olszewski, MD  losartan Lynelle Smoke)  100 MG tablet Take 1 tablet (100 mg total) by mouth daily. 04/03/22   Lula Olszewski, MD  melatonin 5 MG TABS Take 1 tablet (5 mg total) by mouth at bedtime. 02/28/22   Lula Olszewski, MD  metoprolol tartrate (LOPRESSOR) 25 MG tablet Take 25 mg by mouth daily. 04/23/22   [provider]  potassium chloride (KLOR-CON M) 10 MEQ tablet Take 1 tablet (10 mEq total) by mouth daily. 03/18/22   Lula Olszewski, MD  senna-docusate (SENOKOT-S) 8.6-50 MG tablet Take 1 tablet by mouth 2 (two) times daily. 02/28/22   Lula Olszewski, MD  ticagrelor (BRILINTA) 90 MG TABS tablet Take 1 tablet (90 mg total) by mouth 2 (two) times daily. 02/28/22   Lula Olszewski, MD  DULoxetine (CYMBALTA) 30 MG capsule Take 30 mg by mouth daily. Then increase to two 30 mg tablets (total 60 mg) by mouth once daily  04/17/22  [provider]    Family History    Family History  Problem Relation Age of Onset   Alcoholism Father    Arrhythmia Brother    Diabetes Brother    Mental illness Brother    She indicated that her mother is deceased. She indicated that her father is deceased. She indicated that only one of her two brothers is alive.  Social History    Social History    Socioeconomic History   Marital status: Single    Spouse name: Not on file   Number of children: 0   Years of education: Not on file   Highest education level: Not on file  Occupational History   Occupation: Resume building  Tobacco Use   Smoking status: Never   Smokeless tobacco: Never  Vaping Use   Vaping status: Never Used  Substance and Sexual Activity   Alcohol use: No   Drug use: No   Sexual activity: Not on file  Other Topics Concern   Not on file  Social History Narrative   Not on file   Social Drivers of Health   Financial Resource Strain: Low Risk  (08/22/2022)   Overall Financial Resource Strain (CARDIA)    Difficulty of Paying Living Expenses: Not hard at all  Food Insecurity: No Food Insecurity (08/22/2022)   Hunger Vital Sign    Worried About Running Out of Food in the Last Year: Never true    Ran Out of Food in the Last Year: Never true  Transportation Needs: No Transportation Needs (03/18/2022)   PRAPARE - Administrator, Civil Service (Medical): No    Lack of Transportation (Non-Medical): No  Physical Activity: Not on file  Stress: Not on file  Social Connections: Not on file  Intimate Partner Violence: Not At Risk (01/29/2022)   Humiliation, Afraid, Rape, and Kick questionnaire    Fear of Current or Ex-Partner: No    Emotionally Abused: No    Physically Abused: No    Sexually Abused: No     Review of Systems    General:  No chills, fever, night sweats or weight changes.  Cardiovascular:  No chest pain, dyspnea on exertion, edema, orthopnea, palpitations, paroxysmal nocturnal dyspnea. Dermatological: No rash, lesions/masses Respiratory: No cough, dyspnea Urologic: No hematuria, dysuria Abdominal:   No nausea, vomiting, diarrhea, bright red blood per rectum, melena, or hematemesis Neurologic:  No visual changes, wkns, changes in mental status. All other systems reviewed and are otherwise negative except as noted above.  Physical  Exam    VS:  There were no vitals taken for this visit. , BMI There is no height or weight on file to calculate BMI. GEN: Well nourished, well developed, in no acute distress. HEENT: normal. Neck: Supple, no JVD, carotid bruits, or masses. Cardiac: RRR, no murmurs, rubs, or gallops. No clubbing, cyanosis, edema.  Radials/DP/PT 2+ and equal bilaterally.  Respiratory:  Respirations regular and unlabored, clear to auscultation bilaterally. GI: Soft, nontender, nondistended, BS + x 4. MS: no deformity or atrophy. Skin: warm and dry, no rash. Neuro:  Strength and sensation are intact. Psych: Normal affect.  Accessory Clinical Findings    Recent Labs: 07/09/2022: ALT 16; BUN 12; Creatinine, Ser 0.81; Magnesium 2.0; Potassium 3.5; Sodium 142   Recent Lipid Panel    Component Value Date/Time   CHOL 226 (H) 01/28/2022 0319   TRIG 253 (H) 01/28/2022 0319   HDL 35 (L) 01/28/2022 0319   CHOLHDL 6.5 01/28/2022 0319   VLDL 51 (H) 01/28/2022 0319   LDLCALC 140 (H) 01/28/2022 0319    No BP recorded.  {Refresh Note OR Click here to enter BP  :1}***    ECG personally reviewed by me today-none today.  Echocardiogram 01/28/2022 IMPRESSIONS     1. Limited study.   2. Left ventricular ejection fraction, by estimation, is 70 to 75%. The  left ventricle has hyperdynamic function. 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 Left ventricular diastolic  function could not be evaluated.   3. The mitral valve is abnormal, mild to moderate annular calcification.   4. The aortic valve is tricuspid. Aortic valve sclerosis is present, with  no evidence of aortic valve stenosis.   5. The inferior vena cava is normal in size with greater than 50%  respiratory variability, suggesting right atrial pressure of 3 mmHg.   FINDINGS   Left Ventricle: Left ventricular ejection fraction, by estimation, is 70  to 75%. The left ventricle has hyperdynamic function. The left  ventricular  internal cavity size was normal in size. There is severe asymmetric left  ventricular hypertrophy of the  basal segment. Left ventricular diastolic function could not be evaluated.   Pericardium: There is no evidence of pericardial effusion. Presence of  epicardial fat layer.   Mitral Valve: The mitral valve is abnormal. Mild to moderate mitral  annular calcification.   Tricuspid Valve: The tricuspid valve is grossly normal.   Aortic Valve: The aortic valve is tricuspid. There is mild aortic valve  annular calcification. Aortic valve sclerosis is present, with no evidence  of aortic valve stenosis.   Aorta: The aortic root is normal in size and structure.   Venous: The inferior vena cava is normal in size with greater than 50%  respiratory variability, suggesting right atrial pressure of 3 mmHg.   IAS/Shunts: The interatrial septum was not assessed.    Nuclear stress test 12/17/2021    Findings are consistent with ischemia. The study is low-intermediate risk.   No ST deviation was noted. ECG rhythm shows sinus bradycardia at rest.   LV perfusion is abnormal. There is evidence of ischemia. Defect 1: There is a small defect with moderate reduction in uptake present in the apical to mid anterior location(s) that is partially reversible (see graphic). There is abnormal wall motion in the defect area. Consistent with ischemia given anteroapical wall motion abnormality.   Left ventricular function is grossly normal. Nuclear stress EF: 50 %, however visually appears closer to 60%.  End diastolic cavity size is normal. End  systolic cavity size is normal.   Prior study not available for comparison.  Assessment & Plan   1.  Paroxysmal atrial fibrillation-heart rate today 57 bpm.  Reports continued compliance with apixaban and denies bleeding issues.   Denies sustained irregular heartbeats or palpitations. Continue  apixaban, metoprolol 12.5 mg daily.  May take an extra 12.5 mg  for sustained palpitations.-Reviewed Heart healthy low-sodium diet-continue Avoid triggers caffeine, chocolate, EtOH, dehydration etc.-reviewed  Essential hypertension-BP today 152/86.   Continue   amlodipine, metoprolol Order valsartan 80 mg Maintain blood pressure log BMP in 2 wks   Abnormal cardiac stress test-denies recent episodes of chest discomfort.  Previously she was noted to have mild anterior defect on nuclear stress testing.  It was felt that this may be related to artifact.  CCTA was ordered for further evaluation but not completed.  Of note, she had 2 strokes towards the end of 2023. Her stroke was October 30 and post CVA she underwent  carotid stenting and had second CVA she is right side affected.    Previously she wished to defer coronary CTA. Does not want to do cta at this time d/t not being able to drive.   Disposition: Follow-up with Dr. Flora Lipps or me in 2-3 months.   Thomasene Ripple. Caid Radin NP-C     05/25/2023, 9:58 AM [facility] [address] Office [phone] Fax [phone]    I spent 14 ***minutes examining this patient, reviewing medications, and using patient centered shared decision making involving her cardiac care.  Prior to her visit I spent greater than 20 minutes reviewing her past medical history,  medications, and prior cardiac tests.

## 2023-05-27 ENCOUNTER — Ambulatory Visit: Payer: Medicare HMO | Admitting: General Practice

## 2023-06-10 ENCOUNTER — Other Ambulatory Visit: Payer: Self-pay | Admitting: Internal Medicine

## 2023-06-10 DIAGNOSIS — M79601 Pain in right arm: Secondary | ICD-10-CM

## 2023-06-15 ENCOUNTER — Ambulatory Visit: Admitting: Internal Medicine

## 2023-06-22 NOTE — Progress Notes (Deleted)
 Cardiology Clinic Note   Patient Name: Anita Fletcher Date of Encounter: 06/22/2023  Primary Care Provider:  Anthon Kins, MD Primary Cardiologist:  Oneil Bigness, MD  Patient Profile    Anita Fletcher 78 year old female presents the clinic today for follow-up evaluation of her paroxysmal atrial fibrillation and hypertension.  Past Medical History    Past Medical History:  Diagnosis Date   Acute CVA (cerebrovascular accident) (HCC) 01/27/2022   Arm pain 07/09/2022   Started around mid March woke her up feels like broken arm in the right midshaft humerus feels like muscle pain feels like tightness in the shoulder feels like numbness in the hand Is not clear whether moving the arm aggravates the pain she says it does not hurt more but it does not help it She has not been able to identify anything that relieves the pain or triggers the pain The pain is like a   Hearing loss 07/10/2022   Started to be problematic a couple months after cerebrovascular accident   HTN (hypertension) 01/27/2022   Leg swelling 06/18/2022   Anita Fletcher noticed a few weeks ago. Patient complains of numbness sensation without loss of sensation I closely evaluated her legs and she has bilateral mild pitting edema 2+ with increased sensation of edema on the right leg but I cannot really tell the difference.  There is no tenderness in her calves there is excellent circulation and pulse on bilateral dorsal pedis and posterior tibialis, excell   Neuropathic pain 04/03/2022   She is a lot more pain in her right hand that is nerve pain so I suspect she is recovering some from her stroke but I advised that she increase her gabapentin  it even though she rather avoid that because of the drowsiness am sending in a higher dose so that she at least has it available for use if the pain gets too much.  I okayed her to return to work.  I offered to send her to physical therapy a   Neuropathy 06/18/2022   Sensation of  numbness R leg only First noticed with swelling 05/2022, seemed disconnected to her CVA October to November. 2023     PAF (paroxysmal atrial fibrillation) (HCC)    TIA (transient ischemic attack)    Past Surgical History:  Procedure Laterality Date   EYE SURGERY     IR ANGIO INTRA EXTRACRAN SEL COM CAROTID INNOMINATE BILAT MOD SED  01/30/2022   IR ANGIO VERTEBRAL SEL VERTEBRAL UNI L MOD SED  01/30/2022   IR ANGIO VERTEBRAL SEL VERTEBRAL UNI L MOD SED  01/31/2022   IR ANGIOGRAM FOLLOW UP STUDY  01/31/2022   IR CT HEAD LTD  01/31/2022   IR INTRA CRAN STENT  01/31/2022   IR US  GUIDE VASC ACCESS LEFT  01/31/2022   IR US  GUIDE VASC ACCESS RIGHT  01/30/2022   RADIOLOGY WITH ANESTHESIA N/A 01/30/2022   Procedure: MRI WITH ANESTHESIA - BRAIN W/O CONSTRAST;  Surgeon: Radiologist, Medication, MD;  Location: [facility] OR;  Service: Radiology;  Laterality: N/A;   RADIOLOGY WITH ANESTHESIA N/A 01/31/2022   Procedure: RADIOLOGY WITH ANESTHESIA;  Surgeon: Radiologist, Medication, MD;  Location: [facility] OR;  Service: Radiology;  Laterality: N/A;    Allergies  Allergies  Allergen Reactions   Codeine     History of Present Illness    Anita Fletcher has a PMH of paroxysmal atrial fibrillation hypertension, CVA with residual deficit, hypokalemia, insomnia, constipation, and neuropathic pain.  CHA2DS2-VASc score 3 (age,  hypertension, female).  Echocardiogram showed normal LV function.  She was seen in follow-up by Dr. Rolm Clos on 01/16/2022.  During that time there were concerns for anterior ischemia on her nuclear medicine stress test.  Her previous monitor showed no recurrent atrial fibrillation.  Her blood pressure was elevated at 160/100.  She indicated that her blood pressure was also elevated at home.  She denied recurrent atrial fibrillation/palpitations.  She was using a [place] mobile device.  She denied chest pain and trouble with her breathing.  She did not have formal exercise routine.  She reported compliance with  apixaban  and denied bleeding issues.  A BMP and coronary CTA were ordered but not completed.  Losartan  50 mg daily was also prescribed.  She presented to the clinic 05/16/22 for follow-up evaluation and stated she had two CVA's.  Her first CVA was October 30.  She was undergoing carotid endarterectomy and had a second CVA during the procedure.  She was then sent to rehab and is not driving at this time.  She was unable to complete coronary CTA.  We reviewed her previous stress testing and she expressed understanding.  She did not feel that she was taking metoprolol  and had noted episodes of atrial fibrillation.  I started metoprolol  tartrate 12.5 mg daily and  instructed her that she may take an extra 12.5 mg for episodes of sustained palpitations.  We reviewed triggers for atrial fibrillation.  I will plan follow-up in 3 to 4 months.  She presented to the clinic 08/19/22 for follow-up evaluation and stated her blood pressure had been elevated in the 150s-160 systolic at home.  She continued to work on physical therapy movements for her right side.  She was regaining sensation in her right arm.  She reported that she would have a CT which was scheduled for the 31st.  We reviewed need for coronary CTA.  She wished to defer at that time due to not being able to drive.  I  stopped her losartan  and started valsartan  80 mg.  I planned repeat BMP in 2 weeks,  and plan follow-up in 2 to 3 months.  She presents to the clinic today for follow-up evaluation and states***.  Today she denies chest pain, shortness of breath, lower extremity edema, fatigue, palpitations, melena, hematuria, hemoptysis, diaphoresis, weakness, presyncope, syncope, orthopnea, and PND.   Paroxysmal atrial fibrillation-heart rate today***57 bpm.  Compliant with apixaban .  Has not had any bleeding issues.   Denies irregular or accelerated heartbeats. Continue  apixaban , metoprolol  12.5 mg daily.  May take an extra 12.5 mg for sustained  palpitations.-Again reviewed Heart healthy low-sodium diet-salty 6 diet sheet given Avoid triggers caffeine, chocolate, EtOH, dehydration etc.-reviewed  Abnormal cardiac stress test-denies anginal type symptoms.  She previously  was noted to have mild anterior defect on nuclear stress testing.  It was felt that this may be related to artifact.  CCTA was ordered for further evaluation, she wished to defer testing.  She had 2 strokes towards the end of 2023. Her stroke was October 30 and post CVA she underwent  carotid stenting and had second CVA she is right side affected.     Does not want to do cta at this time d/t not being able to drive***.   Essential hypertension-BP today 152***/86.   Continue   amlodipine , metoprolol , valsartan  Maintain blood pressure log Order BMP   Disposition: Follow-up with Dr. Rolm Clos or me in 6 months.  Home Medications    Prior to Admission  medications   Medication Sig Start Date End Date Taking? Authorizing Provider  amLODipine  (NORVASC ) 5 MG tablet Take 1 tablet (5 mg total) by mouth daily. 02/28/22   Anthon Kins, MD  apixaban  (ELIQUIS ) 5 MG TABS tablet Take 1 tablet (5 mg total) by mouth 2 (two) times daily. 02/28/22   Anthon Kins, MD  atorvastatin  (LIPITOR) 40 MG tablet Take 1 tablet (40 mg total) by mouth daily. 02/28/22   Anthon Kins, MD  diclofenac  Sodium (VOLTAREN ) 1 % GEL Apply 2 g topically 4 (four) times daily. 02/13/22   Love, Renay Carota, PA-C  fluticasone  (FLONASE ) 50 MCG/ACT nasal spray Use 1 spray in each nostril once daily. 02/14/22   Love, Renay Carota, PA-C  gabapentin  (NEURONTIN ) 100 MG capsule Take 1 capsule (100 mg total) by mouth 3 (three) times daily. 04/03/22   Anthon Kins, MD  lidocaine  (XYLOCAINE ) 5 % ointment Apply 1 Application topically as needed. 04/03/22   Anthon Kins, MD  loratadine  (CLARITIN ) 10 MG tablet Take 1 tablet (10 mg total) by mouth daily. 03/18/22   Anthon Kins, MD  losartan  (COZAAR ) 100 MG tablet  Take 1 tablet (100 mg total) by mouth daily. 04/03/22   Anthon Kins, MD  melatonin 5 MG TABS Take 1 tablet (5 mg total) by mouth at bedtime. 02/28/22   Anthon Kins, MD  metoprolol  tartrate (LOPRESSOR ) 25 MG tablet Take 25 mg by mouth daily. 04/23/22   [provider]  potassium chloride  (KLOR-CON  M) 10 MEQ tablet Take 1 tablet (10 mEq total) by mouth daily. 03/18/22   Anthon Kins, MD  senna-docusate (SENOKOT-S) 8.6-50 MG tablet Take 1 tablet by mouth 2 (two) times daily. 02/28/22   Anthon Kins, MD  ticagrelor  (BRILINTA ) 90 MG TABS tablet Take 1 tablet (90 mg total) by mouth 2 (two) times daily. 02/28/22   Anthon Kins, MD  DULoxetine  (CYMBALTA ) 30 MG capsule Take 30 mg by mouth daily. Then increase to two 30 mg tablets (total 60 mg) by mouth once daily  04/17/22  [provider]    Family History    Family History  Problem Relation Age of Onset   Alcoholism Father    Arrhythmia Brother    Diabetes Brother    Mental illness Brother    She indicated that her mother is deceased. She indicated that her father is deceased. She indicated that only one of her two brothers is alive.  Social History    Social History   Socioeconomic History   Marital status: Single    Spouse name: Not on file   Number of children: 0   Years of education: Not on file   Highest education level: Some college, no degree  Occupational History   Occupation: Resume building  Tobacco Use   Smoking status: Never   Smokeless tobacco: Never  Vaping Use   Vaping status: Never Used  Substance and Sexual Activity   Alcohol use: No   Drug use: No   Sexual activity: Not on file  Other Topics Concern   Not on file  Social History Narrative   Not on file   Social Drivers of Health   Financial Resource Strain: Low Risk  (06/13/2023)   Overall Financial Resource Strain (CARDIA)    Difficulty of Paying Living Expenses: Not very hard  Food Insecurity: No Food Insecurity  (06/13/2023)   Hunger Vital Sign    Worried About Running Out of Food in the Last  Year: Never true    Ran Out of Food in the Last Year: Never true  Transportation Needs: Unmet Transportation Needs (06/13/2023)   PRAPARE - Administrator, Civil Service (Medical): Yes    Lack of Transportation (Non-Medical): No  Physical Activity: Unknown (06/13/2023)   Exercise Vital Sign    Days of Exercise per Week: 0 days    Minutes of Exercise per Session: Not on file  Stress: Stress Concern Present (06/13/2023)   [employer] of Occupational Health - Occupational Stress Questionnaire    Feeling of Stress : To some extent  Social Connections: Socially Isolated (06/13/2023)   Social Connection and Isolation Panel [NHANES]    Frequency of Communication with Friends and Family: Three times a week    Frequency of Social Gatherings with Friends and Family: Never    Attends Religious Services: Never    Database administrator or Organizations: No    Attends Engineer, structural: Not on file    Marital Status: Never married  Intimate Partner Violence: Not At Risk (01/29/2022)   Humiliation, Afraid, Rape, and Kick questionnaire    Fear of Current or Ex-Partner: No    Emotionally Abused: No    Physically Abused: No    Sexually Abused: No     Review of Systems    General:  No chills, fever, night sweats or weight changes.  Cardiovascular:  No chest pain, dyspnea on exertion, edema, orthopnea, palpitations, paroxysmal nocturnal dyspnea. Dermatological: No rash, lesions/masses Respiratory: No cough, dyspnea Urologic: No hematuria, dysuria Abdominal:   No nausea, vomiting, diarrhea, bright red blood per rectum, melena, or hematemesis Neurologic:  No visual changes, wkns, changes in mental status. All other systems reviewed and are otherwise negative except as noted above.  Physical Exam    VS:  There were no vitals taken for this visit. , BMI There is no height or weight on file to  calculate BMI. GEN: Well nourished, well developed, in no acute distress. HEENT: normal. Neck: Supple, no JVD, carotid bruits, or masses. Cardiac: RRR, no murmurs, rubs, or gallops. No clubbing, cyanosis, edema.  Radials/DP/PT 2+ and equal bilaterally.  Respiratory:  Respirations regular and unlabored, clear to auscultation bilaterally. GI: Soft, nontender, nondistended, BS + x 4. MS: no deformity or atrophy. Skin: warm and dry, no rash. Neuro:  Strength and sensation are intact. Psych: Normal affect.  Accessory Clinical Findings    Recent Labs: 07/09/2022: ALT 16; BUN 12; Creatinine, Ser 0.81; Magnesium 2.0; Potassium 3.5; Sodium 142   Recent Lipid Panel    Component Value Date/Time   CHOL 226 (H) 01/28/2022 0319   TRIG 253 (H) 01/28/2022 0319   HDL 35 (L) 01/28/2022 0319   CHOLHDL 6.5 01/28/2022 0319   VLDL 51 (H) 01/28/2022 0319   LDLCALC 140 (H) 01/28/2022 0319    No BP recorded.  {Refresh Note OR Click here to enter BP  :1}***    ECG personally reviewed by me today-none today.  Echocardiogram 01/28/2022 IMPRESSIONS     1. Limited study.   2. Left ventricular ejection fraction, by estimation, is 70 to 75%. The  left ventricle has hyperdynamic function. 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 Left ventricular diastolic  function could not be evaluated.   3. The mitral valve is abnormal, mild to moderate annular calcification.   4. The aortic valve is tricuspid. Aortic valve sclerosis is present, with  no evidence of aortic valve stenosis.  5. The inferior vena cava is normal in size with greater than 50%  respiratory variability, suggesting right atrial pressure of 3 mmHg.   FINDINGS   Left Ventricle: Left ventricular ejection fraction, by estimation, is 70  to 75%. The left ventricle has hyperdynamic function. The left ventricular  internal cavity size was normal in size. There is severe asymmetric left  ventricular  hypertrophy of the  basal segment. Left ventricular diastolic function could not be evaluated.   Pericardium: There is no evidence of pericardial effusion. Presence of  epicardial fat layer.   Mitral Valve: The mitral valve is abnormal. Mild to moderate mitral  annular calcification.   Tricuspid Valve: The tricuspid valve is grossly normal.   Aortic Valve: The aortic valve is tricuspid. There is mild aortic valve  annular calcification. Aortic valve sclerosis is present, with no evidence  of aortic valve stenosis.   Aorta: The aortic root is normal in size and structure.   Venous: The inferior vena cava is normal in size with greater than 50%  respiratory variability, suggesting right atrial pressure of 3 mmHg.   IAS/Shunts: The interatrial septum was not assessed.    Nuclear stress test 12/17/2021    Findings are consistent with ischemia. The study is low-intermediate risk.   No ST deviation was noted. ECG rhythm shows sinus bradycardia at rest.   LV perfusion is abnormal. There is evidence of ischemia. Defect 1: There is a small defect with moderate reduction in uptake present in the apical to mid anterior location(s) that is partially reversible (see graphic). There is abnormal wall motion in the defect area. Consistent with ischemia given anteroapical wall motion abnormality.   Left ventricular function is grossly normal. Nuclear stress EF: 50 %, however visually appears closer to 60%.  End diastolic cavity size is normal. End systolic cavity size is normal.   Prior study not available for comparison.  Assessment & Plan   1.  ***  Chet Cota. Kalen Ratajczak NP-C     06/22/2023, 7:49 PM [facility] [address] Office [phone] Fax [phone]    I spent 14 ***minutes examining this patient, reviewing medications, and using patient centered shared decision making involving her cardiac care.  Prior to her visit I spent greater than 20  minutes reviewing her past medical history,  medications, and prior cardiac tests.

## 2023-06-25 ENCOUNTER — Ambulatory Visit: Payer: Medicare HMO | Admitting: General Practice

## 2023-06-28 IMAGING — DX DG CHEST 1V PORT
1 series · 1 of 1 positions shown · non-contrast
Comparison: 11/01/2009

CLINICAL DATA: Atrial fibrillation, chest pressure, short of breath

EXAM:
PORTABLE CHEST 1 VIEW

[chest ap]
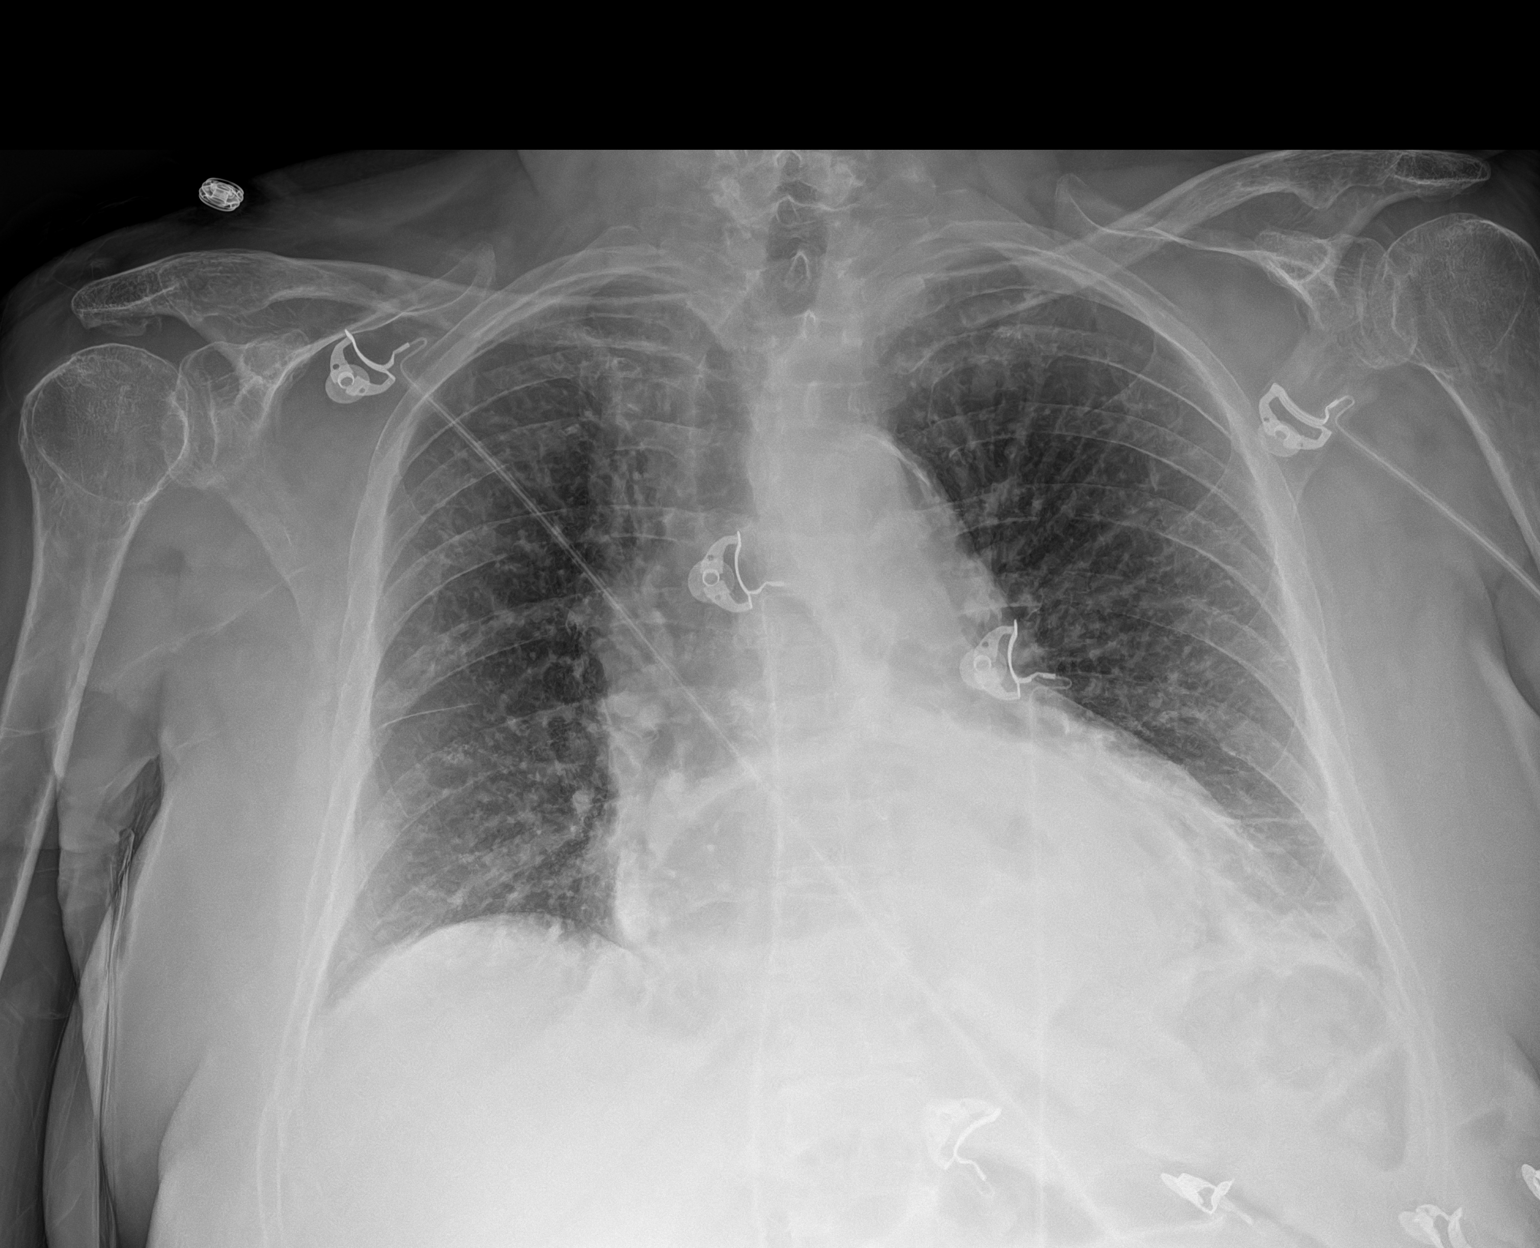

[1 of 1 positions shown; findings below may reference images not displayed]

FINDINGS: Single frontal view of the chest demonstrates a stable cardiac
silhouette. Large hiatal hernia. Linear consolidation at the left
lung base likely atelectasis or scarring. No acute airspace disease,
effusion, or pneumothorax. No acute bony abnormalities.
IMPRESSION: 1. Linear left basilar consolidation, most consistent with
atelectasis or scarring.
2. Large hiatal hernia.

## 2023-06-29 ENCOUNTER — Ambulatory Visit: Admitting: Internal Medicine

## 2023-07-07 DIAGNOSIS — I10 Essential (primary) hypertension: Secondary | ICD-10-CM | POA: Diagnosis not present

## 2023-07-07 DIAGNOSIS — R0602 Shortness of breath: Secondary | ICD-10-CM | POA: Diagnosis not present

## 2023-07-30 ENCOUNTER — Ambulatory Visit: Admitting: Internal Medicine

## 2023-08-03 ENCOUNTER — Encounter (HOSPITAL_BASED_OUTPATIENT_CLINIC_OR_DEPARTMENT_OTHER): Payer: Self-pay

## 2023-08-04 NOTE — Progress Notes (Deleted)
 Cardiology Clinic Note   Patient Name: Anita Fletcher Date of Encounter: 08/04/2023  Primary Care Provider:  Anthon Kins, MD Primary Cardiologist:  Oneil Bigness, MD  Patient Profile    Anita Fletcher 78 year old female presents the clinic today for follow-up evaluation of her paroxysmal atrial fibrillation and hypertension.  Past Medical History    Past Medical History:  Diagnosis Date   Acute CVA (cerebrovascular accident) (HCC) 01/27/2022   Arm pain 07/09/2022   Started around mid March woke her up feels like broken arm in the right midshaft humerus feels like muscle pain feels like tightness in the shoulder feels like numbness in the hand Is not clear whether moving the arm aggravates the pain she says it does not hurt more but it does not help it She has not been able to identify anything that relieves the pain or triggers the pain The pain is like a   Hearing loss 07/10/2022   Started to be problematic a couple months after cerebrovascular accident   HTN (hypertension) 01/27/2022   Leg swelling 06/18/2022   Anita Fletcher noticed a few weeks ago. Patient complains of numbness sensation without loss of sensation I closely evaluated her legs and she has bilateral mild pitting edema 2+ with increased sensation of edema on the right leg but I cannot really tell the difference.  There is no tenderness in her calves there is excellent circulation and pulse on bilateral dorsal pedis and posterior tibialis, excell   Neuropathic pain 04/03/2022   She is a lot more pain in her right hand that is nerve pain so I suspect she is recovering some from her stroke but I advised that she increase her gabapentin  it even though she rather avoid that because of the drowsiness am sending in a higher dose so that she at least has it available for use if the pain gets too much.  I okayed her to return to work.  I offered to send her to physical therapy a   Neuropathy 06/18/2022   Sensation of numbness  R leg only First noticed with swelling 05/2022, seemed disconnected to her CVA October to November. 2023     PAF (paroxysmal atrial fibrillation) (HCC)    TIA (transient ischemic attack)    Past Surgical History:  Procedure Laterality Date   EYE SURGERY     IR ANGIO INTRA EXTRACRAN SEL COM CAROTID INNOMINATE BILAT MOD SED  01/30/2022   IR ANGIO VERTEBRAL SEL VERTEBRAL UNI L MOD SED  01/30/2022   IR ANGIO VERTEBRAL SEL VERTEBRAL UNI L MOD SED  01/31/2022   IR ANGIOGRAM FOLLOW UP STUDY  01/31/2022   IR CT HEAD LTD  01/31/2022   IR INTRA CRAN STENT  01/31/2022   IR US  GUIDE VASC ACCESS LEFT  01/31/2022   IR US  GUIDE VASC ACCESS RIGHT  01/30/2022   RADIOLOGY WITH ANESTHESIA N/A 01/30/2022   Procedure: MRI WITH ANESTHESIA - BRAIN W/O CONSTRAST;  Surgeon: Radiologist, Medication, MD;  Location: [facility] OR;  Service: Radiology;  Laterality: N/A;   RADIOLOGY WITH ANESTHESIA N/A 01/31/2022   Procedure: RADIOLOGY WITH ANESTHESIA;  Surgeon: Radiologist, Medication, MD;  Location: [facility] OR;  Service: Radiology;  Laterality: N/A;    Allergies  Allergies  Allergen Reactions   Codeine     History of Present Illness    Anita Fletcher has a PMH of paroxysmal atrial fibrillation hypertension, CVA with residual deficit, hypokalemia, insomnia, constipation, and neuropathic pain.  CHA2DS2-VASc score 3 (age,  hypertension, female).  Echocardiogram showed normal LV function.  She was seen in follow-up by Dr. Rolm Clos on 01/16/2022.  During that time there were concerns for anterior ischemia on her nuclear medicine stress test.  Her previous monitor showed no recurrent atrial fibrillation.  Her blood pressure was elevated at 160/100.  She indicated that her blood pressure was also elevated at home.  She denied recurrent atrial fibrillation/palpitations.  She was using a [place] mobile device.  She denied chest pain and trouble with her breathing.  She did not have formal exercise routine.  She reported compliance with apixaban   and denied bleeding issues.  A BMP and coronary CTA were ordered but not completed.  Losartan  50 mg daily was also prescribed.  She presented to the clinic 05/16/22 for follow-up evaluation and stated she had two CVA's.  Her first CVA was October 30.  She was undergoing carotid endarterectomy and had a second CVA during the procedure.  She was then sent to rehab and is not driving at this time.  She was unable to complete coronary CTA.  We reviewed her previous stress testing and she expressed understanding.  She did not feel that she was taking metoprolol  and had noted episodes of atrial fibrillation.  I started metoprolol  tartrate 12.5 mg daily and  instructed her that she may take an extra 12.5 mg for episodes of sustained palpitations.  We reviewed triggers for atrial fibrillation.  I will plan follow-up in 3 to 4 months.  She presents to the clinic 08/19/22 for follow-up evaluation and stated her blood pressure had been elevated in the 150s-160 systolic at home.  She continued to work on physical therapy movements for her right side.  She was regaining sensation in her right arm.  She reported that she would have a CT which is scheduled for the 31st.  We reviewed need for coronary CTA.  She wished to defer at the time due to not being able to drive.  I  stopped her losartan  and started valsartan  80 mg.  I planned repeat BMP in 2 weeks, and planned follow-up in 2 to 3 months.  She presents to the clinic today for follow-up evaluation and states***.  Today she denies chest pain, shortness of breath, lower extremity edema, fatigue, palpitations, melena, hematuria, hemoptysis, diaphoresis, weakness, presyncope, syncope, orthopnea, and PND.   Home Medications    Prior to Admission medications   Medication Sig Start Date End Date Taking? Authorizing Provider  amLODipine  (NORVASC ) 5 MG tablet Take 1 tablet (5 mg total) by mouth daily. 02/28/22   Anthon Kins, MD  apixaban  (ELIQUIS ) 5 MG TABS tablet  Take 1 tablet (5 mg total) by mouth 2 (two) times daily. 02/28/22   Anthon Kins, MD  atorvastatin  (LIPITOR) 40 MG tablet Take 1 tablet (40 mg total) by mouth daily. 02/28/22   Anthon Kins, MD  diclofenac  Sodium (VOLTAREN ) 1 % GEL Apply 2 g topically 4 (four) times daily. 02/13/22   Love, Renay Carota, PA-C  fluticasone  (FLONASE ) 50 MCG/ACT nasal spray Use 1 spray in each nostril once daily. 02/14/22   Love, Renay Carota, PA-C  gabapentin  (NEURONTIN ) 100 MG capsule Take 1 capsule (100 mg total) by mouth 3 (three) times daily. 04/03/22   Anthon Kins, MD  lidocaine  (XYLOCAINE ) 5 % ointment Apply 1 Application topically as needed. 04/03/22   Anthon Kins, MD  loratadine  (CLARITIN ) 10 MG tablet Take 1 tablet (10 mg total) by mouth daily. 03/18/22  Anthon Kins, MD  losartan  (COZAAR ) 100 MG tablet Take 1 tablet (100 mg total) by mouth daily. 04/03/22   Anthon Kins, MD  melatonin 5 MG TABS Take 1 tablet (5 mg total) by mouth at bedtime. 02/28/22   Anthon Kins, MD  metoprolol  tartrate (LOPRESSOR ) 25 MG tablet Take 25 mg by mouth daily. 04/23/22   [provider]  potassium chloride  (KLOR-CON  M) 10 MEQ tablet Take 1 tablet (10 mEq total) by mouth daily. 03/18/22   Anthon Kins, MD  senna-docusate (SENOKOT-S) 8.6-50 MG tablet Take 1 tablet by mouth 2 (two) times daily. 02/28/22   Anthon Kins, MD  ticagrelor  (BRILINTA ) 90 MG TABS tablet Take 1 tablet (90 mg total) by mouth 2 (two) times daily. 02/28/22   Anthon Kins, MD  DULoxetine  (CYMBALTA ) 30 MG capsule Take 30 mg by mouth daily. Then increase to two 30 mg tablets (total 60 mg) by mouth once daily  04/17/22  [provider]    Family History    Family History  Problem Relation Age of Onset   Alcoholism Father    Arrhythmia Brother    Diabetes Brother    Mental illness Brother    She indicated that her mother is deceased. She indicated that her father is deceased. She indicated that only one of her two  brothers is alive.  Social History    Social History   Socioeconomic History   Marital status: Single    Spouse name: Not on file   Number of children: 0   Years of education: Not on file   Highest education level: Some college, no degree  Occupational History   Occupation: Resume building  Tobacco Use   Smoking status: Never   Smokeless tobacco: Never  Vaping Use   Vaping status: Never Used  Substance and Sexual Activity   Alcohol use: No   Drug use: No   Sexual activity: Not on file  Other Topics Concern   Not on file  Social History Narrative   Not on file   Social Drivers of Health   Financial Resource Strain: Low Risk  (06/13/2023)   Overall Financial Resource Strain (CARDIA)    Difficulty of Paying Living Expenses: Not very hard  Food Insecurity: No Food Insecurity (06/13/2023)   Hunger Vital Sign    Worried About Running Out of Food in the Last Year: Never true    Ran Out of Food in the Last Year: Never true  Transportation Needs: Unmet Transportation Needs (06/13/2023)   PRAPARE - Administrator, Civil Service (Medical): Yes    Lack of Transportation (Non-Medical): No  Physical Activity: Unknown (06/13/2023)   Exercise Vital Sign    Days of Exercise per Week: 0 days    Minutes of Exercise per Session: Not on file  Stress: Stress Concern Present (06/13/2023)   [employer] of Occupational Health - Occupational Stress Questionnaire    Feeling of Stress : To some extent  Social Connections: Socially Isolated (06/13/2023)   Social Connection and Isolation Panel [NHANES]    Frequency of Communication with Friends and Family: Three times a week    Frequency of Social Gatherings with Friends and Family: Never    Attends Religious Services: Never    Database administrator or Organizations: No    Attends Banker Meetings: Not on file    Marital Status: Never married  Intimate Partner Violence: Not At Risk (01/29/2022)   Humiliation,  Afraid, Rape, and Kick questionnaire    Fear of Current or Ex-Partner: No    Emotionally Abused: No    Physically Abused: No    Sexually Abused: No     Review of Systems    General:  No chills, fever, night sweats or weight changes.  Cardiovascular:  No chest pain, dyspnea on exertion, edema, orthopnea, palpitations, paroxysmal nocturnal dyspnea. Dermatological: No rash, lesions/masses Respiratory: No cough, dyspnea Urologic: No hematuria, dysuria Abdominal:   No nausea, vomiting, diarrhea, bright red blood per rectum, melena, or hematemesis Neurologic:  No visual changes, wkns, changes in mental status. All other systems reviewed and are otherwise negative except as noted above.  Physical Exam    VS:  There were no vitals taken for this visit. , BMI There is no height or weight on file to calculate BMI. GEN: Well nourished, well developed, in no acute distress. HEENT: normal. Neck: Supple, no JVD, carotid bruits, or masses. Cardiac: RRR, no murmurs, rubs, or gallops. No clubbing, cyanosis, edema.  Radials/DP/PT 2+ and equal bilaterally.  Respiratory:  Respirations regular and unlabored, clear to auscultation bilaterally. GI: Soft, nontender, nondistended, BS + x 4. MS: no deformity or atrophy. Skin: warm and dry, no rash. Neuro:  Strength and sensation are intact. Psych: Normal affect.  Accessory Clinical Findings    Recent Labs: No results found for requested labs within last 365 days.   Recent Lipid Panel    Component Value Date/Time   CHOL 226 (H) 01/28/2022 0319   TRIG 253 (H) 01/28/2022 0319   HDL 35 (L) 01/28/2022 0319   CHOLHDL 6.5 01/28/2022 0319   VLDL 51 (H) 01/28/2022 0319   LDLCALC 140 (H) 01/28/2022 0319    No BP recorded.  {Refresh Note OR Click here to enter BP  :1}***    ECG personally reviewed by me today-none today.  Echocardiogram 01/28/2022 IMPRESSIONS     1. Limited study.   2. Left ventricular ejection fraction, by estimation, is 70 to  75%. The  left ventricle has hyperdynamic function. 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 Left ventricular diastolic  function could not be evaluated.   3. The mitral valve is abnormal, mild to moderate annular calcification.   4. The aortic valve is tricuspid. Aortic valve sclerosis is present, with  no evidence of aortic valve stenosis.   5. The inferior vena cava is normal in size with greater than 50%  respiratory variability, suggesting right atrial pressure of 3 mmHg.   FINDINGS   Left Ventricle: Left ventricular ejection fraction, by estimation, is 70  to 75%. The left ventricle has hyperdynamic function. The left ventricular  internal cavity size was normal in size. There is severe asymmetric left  ventricular hypertrophy of the  basal segment. Left ventricular diastolic function could not be evaluated.   Pericardium: There is no evidence of pericardial effusion. Presence of  epicardial fat layer.   Mitral Valve: The mitral valve is abnormal. Mild to moderate mitral  annular calcification.   Tricuspid Valve: The tricuspid valve is grossly normal.   Aortic Valve: The aortic valve is tricuspid. There is mild aortic valve  annular calcification. Aortic valve sclerosis is present, with no evidence  of aortic valve stenosis.   Aorta: The aortic root is normal in size and structure.   Venous: The inferior vena cava is normal in size with greater than 50%  respiratory variability, suggesting right atrial pressure of 3 mmHg.   IAS/Shunts: The  interatrial septum was not assessed.    Nuclear stress test 12/17/2021    Findings are consistent with ischemia. The study is low-intermediate risk.   No ST deviation was noted. ECG rhythm shows sinus bradycardia at rest.   LV perfusion is abnormal. There is evidence of ischemia. Defect 1: There is a small defect with moderate reduction in uptake present in the apical to mid anterior location(s)  that is partially reversible (see graphic). There is abnormal wall motion in the defect area. Consistent with ischemia given anteroapical wall motion abnormality.   Left ventricular function is grossly normal. Nuclear stress EF: 50 %, however visually appears closer to 60%.  End diastolic cavity size is normal. End systolic cavity size is normal.   Prior study not available for comparison.  Assessment & Plan   1.  Essential hypertension-BP today 152/8***6.   Continue   amlodipine , metoprolol , valsartan  Maintain blood pressure log CMP today  Paroxysmal atrial fibrillation-heart rate today 57***bpm.  Continues to be compliant with apixaban .  Denies bleeding issues.   Denies irregular or accelerated heart rates. Continue  apixaban , metoprolol  12.5 mg daily.  May take an extra 12.5 mg for sustained palpitations.-Again reviewed Continue to avoid triggers for palpitations  Abnormal cardiac stress test-denies anginal type symptoms.  She was previously  noted to have mild anterior defect on nuclear stress testing.  It was felt that this may be related to artifact.  CCTA was ordered for further evaluation but not completed.  Of note, she had 2 strokes towards the end of 2023. Her stroke was October 30 and post CVA she underwent  carotid stenting and had second CVA she is right side affected.    Previously she wished to defer coronary CTA. Order coronary CTA***   Disposition: Follow-up with Dr. Rolm Clos or me in 6 months.   Chet Cota. Aero Drummonds NP-C     08/04/2023, 11:45 AM [facility] [address] Office [phone] Fax [phone]    I spent 14*** minutes examining this patient, reviewing medications, and using patient centered shared decision making involving her cardiac care. I spent  20 minutes reviewing  past medical history,  medications, and prior cardiac tests.

## 2023-08-06 ENCOUNTER — Ambulatory Visit: Admitting: General Practice

## 2023-08-11 ENCOUNTER — Telehealth: Payer: Self-pay | Admitting: Cardiovascular Disease

## 2023-08-11 NOTE — Telephone Encounter (Signed)
*  STAT* If patient is at the pharmacy, call can be transferred to refill team.   1. Which medications need to be refilled? (please list name of each medication and dose if known)   valsartan  (DIOVAN ) 80 MG tablet   2. Would you like to learn more about the convenience, safety, & potential cost savings by using the [facility]?   3. Are you open to using the [facility] (Type [facility]. ).  4. Which pharmacy/location (including street and city if local pharmacy) is medication to be sent to?  [id number] - [place], [place] - [address]   5. Do they need a 30 day or 90 day supply?   90 day  Patient stated she only has a couple of tablets left.

## 2023-08-12 MED ORDER — VALSARTAN 80 MG PO TABS: 80.0000 mg | ORAL_TABLET | Freq: Every day | ORAL | 0 refills | Status: DC

## 2023-08-12 NOTE — Telephone Encounter (Signed)
 RX sent to requested Pharmacy

## 2023-08-18 ENCOUNTER — Encounter: Payer: Self-pay | Admitting: Internal Medicine

## 2023-08-18 ENCOUNTER — Other Ambulatory Visit: Payer: Self-pay

## 2023-08-18 ENCOUNTER — Emergency Department (HOSPITAL_COMMUNITY)

## 2023-08-18 ENCOUNTER — Encounter (HOSPITAL_COMMUNITY): Payer: Self-pay | Admitting: Emergency Medicine

## 2023-08-18 ENCOUNTER — Emergency Department (HOSPITAL_COMMUNITY)
Admission: EM | Admit: 2023-08-18 | Discharge: 2023-08-18 | Disposition: A | Discharge status: Home / Self Care | Attending: Emergency Medicine | Admitting: Emergency Medicine

## 2023-08-18 DIAGNOSIS — Z7982 Long term (current) use of aspirin: Secondary | ICD-10-CM | POA: Insufficient documentation

## 2023-08-18 DIAGNOSIS — R0989 Other specified symptoms and signs involving the circulatory and respiratory systems: Secondary | ICD-10-CM | POA: Insufficient documentation

## 2023-08-18 DIAGNOSIS — R457 State of emotional shock and stress, unspecified: Secondary | ICD-10-CM | POA: Diagnosis not present

## 2023-08-18 DIAGNOSIS — R519 Headache, unspecified: Secondary | ICD-10-CM | POA: Diagnosis not present

## 2023-08-18 DIAGNOSIS — Z7901 Long term (current) use of anticoagulants: Secondary | ICD-10-CM | POA: Insufficient documentation

## 2023-08-18 DIAGNOSIS — I6782 Cerebral ischemia: Secondary | ICD-10-CM | POA: Diagnosis not present

## 2023-08-18 LAB — BASIC METABOLIC PANEL WITH GFR
Anion gap: 9 (ref 5–15)
BUN: 11 mg/dL (ref 8–23)
CO2: 24 mmol/L (ref 22–32)
Calcium: 8.7 mg/dL — ABNORMAL LOW (ref 8.9–10.3)
Chloride: 103 mmol/L (ref 98–111)
Creatinine, Ser: 0.82 mg/dL (ref 0.44–1.00)
GFR, Estimated: >60 mL/min (ref >60)
Glucose, Bld: 111 mg/dL — ABNORMAL HIGH (ref 70–99)
Potassium: 3.6 mmol/L (ref 3.5–5.1)
Sodium: 136 mmol/L (ref 135–145)

## 2023-08-18 LAB — CBC WITH DIFFERENTIAL/PLATELET
Abs Immature Granulocytes: 0.03 10*3/uL (ref 0.00–0.07)
Basophils Absolute: 0 10*3/uL (ref 0.0–0.1)
Basophils Relative: 0 %
Eosinophils Absolute: 0.2 10*3/uL (ref 0.0–0.5)
Eosinophils Relative: 2 %
HCT: 44 % (ref 36.0–46.0)
Hemoglobin: 14.1 g/dL (ref 12.0–15.0)
Immature Granulocytes: 0 %
Lymphocytes Relative: 25 %
Lymphs Abs: 1.9 10*3/uL (ref 0.7–4.0)
MCH: 29.7 pg (ref 26.0–34.0)
MCHC: 32 g/dL (ref 30.0–36.0)
MCV: 92.8 fL (ref 80.0–100.0)
Monocytes Absolute: 0.9 10*3/uL (ref 0.1–1.0)
Monocytes Relative: 11 %
Neutro Abs: 4.8 10*3/uL (ref 1.7–7.7)
Neutrophils Relative %: 62 %
Platelets: 252 10*3/uL (ref 150–400)
RBC: 4.74 MIL/uL (ref 3.87–5.11)
RDW: 12.6 % (ref 11.5–15.5)
WBC: 7.8 10*3/uL (ref 4.0–10.5)
nRBC: 0 % (ref 0.0–0.2)

## 2023-08-18 MED ORDER — METOPROLOL TARTRATE 25 MG PO TABS
12.5000 mg | ORAL_TABLET | Freq: Once | ORAL | Status: AC
  Administered 2023-08-18: 12.5 mg via ORAL
  Filled 2023-08-18: qty 1

## 2023-08-18 NOTE — ED Triage Notes (Signed)
 Pt c/o head pressure that started about a week ago that got worse lastnight

## 2023-08-18 NOTE — ED Provider Notes (Signed)
 [place] EMERGENCY DEPARTMENT AT [facility] Provider Note   CSN: [id number] Arrival date & time: 08/18/23  7829     History  Chief Complaint  Patient presents with   Headache    Anita Fletcher is a 78 y.o. female.  Patient states that she feels like she has head congestion for a number of weeks now.  She has been taking Claritin   The history is provided by the patient and medical records. No language interpreter was used.  Headache Pain location:  Generalized Quality:  Unable to specify Radiates to:  Does not radiate Severity currently:  2/10 Severity at highest:  2/10 Onset quality:  Gradual Timing:  Constant Progression:  Waxing and waning Chronicity:  New Similar to prior headaches: no   Context: not activity   Relieved by:  Nothing Worsened by:  Nothing Associated symptoms: congestion   Associated symptoms: no abdominal pain, no back pain, no cough, no diarrhea, no fatigue, no seizures and no sinus pressure        Home Medications Prior to Admission medications   Medication Sig Start Date End Date Taking? Authorizing Provider  acetaminophen  (TYLENOL ) 650 MG CR tablet Take 650 mg by mouth every 8 (eight) hours as needed for pain.   Yes [provider]  amLODipine  (NORVASC ) 5 MG tablet Take 1 tablet (5 mg total) by mouth at bedtime. 07/23/22  Yes Anthon Kins, MD  aspirin  EC 81 MG tablet Take 81 mg by mouth.   Yes [provider]  Cyanocobalamin (B-12) 1000 MCG CAPS Take 1 each by mouth daily at 6 (six) AM. 06/18/22  Yes Anthon Kins, MD  ELIQUIS  5 MG TABS tablet TAKE 1 TABLET BY MOUTH TWICE A DAY 04/02/23  Yes Anthon Kins, MD  fluticasone  (FLONASE ) 50 MCG/ACT nasal spray Use 1 spray in each nostril once daily. 02/14/22  Yes Love, Renay Carota, PA-C  KLOR-CON  M10 10 MEQ tablet TAKE 1 TABLET BY MOUTH EVERY DAY 12/29/22  Yes Anthon Kins, MD  loratadine  (CLARITIN ) 10 MG tablet Take 1 tablet (10 mg total) by mouth at bedtime.  07/23/22  Yes Anthon Kins, MD  melatonin 5 MG TABS Take 1 tablet (5 mg total) by mouth at bedtime. 02/28/22  Yes Anthon Kins, MD  methocarbamol  (ROBAXIN ) 500 MG tablet TAKE 1 TABLET (500 MG TOTAL) BY MOUTH 4 (FOUR) TIMES DAILY. TAKE FOR MUSCLE PAIN AND SPASM 06/11/23  Yes Anthon Kins, MD  metoprolol  tartrate (LOPRESSOR ) 25 MG tablet TAKE 1/2 TABLET BY MOUTH EVERY DAY 05/12/23  Yes Cleaver, Chet Cota, NP  rosuvastatin  (CRESTOR ) 20 MG tablet TAKE 1 TABLET DAILY. REPLACES ATORVASTATIN . TAKE A BREAK FOR 1 WEEK BEFORE STARTING TO LET YOUR MUSCLE INJURY FROM ATORVASTATIN  HEAL. 05/17/23  Yes Anthon Kins, MD  senna-docusate (SENOKOT-S) 8.6-50 MG tablet Take 1 tablet by mouth 2 (two) times daily. 02/28/22  Yes Anthon Kins, MD  valsartan  (DIOVAN ) 80 MG tablet Take 1 tablet (80 mg total) by mouth daily. 08/12/23  Yes O'Neal, Cathay Clonts, MD  diclofenac  Sodium (VOLTAREN ) 1 % GEL Apply 2 g topically 4 (four) times daily. Patient not taking: Reported on 08/19/2022 02/13/22   Love, Renay Carota, PA-C  gabapentin  (NEURONTIN ) 300 MG capsule Take 1 capsule (300 mg total) by mouth 3 (three) times daily. 06/18/22   Anthon Kins, MD  DULoxetine  (CYMBALTA ) 30 MG capsule Take 30 mg by mouth daily. Then increase to two 30 mg tablets (total 60 mg)  by mouth once daily  04/17/22  [provider]      Allergies    Codeine    Review of Systems   Review of Systems  Constitutional:  Negative for appetite change and fatigue.  HENT:  Positive for congestion. Negative for ear discharge and sinus pressure.   Eyes:  Negative for discharge.  Respiratory:  Negative for cough.   Cardiovascular:  Negative for chest pain.  Gastrointestinal:  Negative for abdominal pain and diarrhea.  Genitourinary:  Negative for frequency and hematuria.  Musculoskeletal:  Negative for back pain.  Skin:  Negative for rash.  Neurological:  Positive for headaches. Negative for seizures.  Psychiatric/Behavioral:  Negative for  hallucinations.     Physical Exam Updated Vital Signs BP 116/77   Pulse 97   Temp (!) 97.5 F (36.4 C) (Oral)   Resp 16   Ht 5' 3.5" (1.613 m)   Wt 72.6 kg   SpO2 97%   BMI 27.90 kg/m  Physical Exam Vitals and nursing note reviewed.  Constitutional:      Appearance: She is well-developed.  HENT:     Head: Normocephalic.     Nose: Nose normal.  Eyes:     General: No scleral icterus.    Conjunctiva/sclera: Conjunctivae normal.  Neck:     Thyroid : No thyromegaly.  Cardiovascular:     Rate and Rhythm: Normal rate and regular rhythm.     Heart sounds: No murmur heard.    No friction rub. No gallop.  Pulmonary:     Breath sounds: No stridor. No wheezing or rales.  Chest:     Chest wall: No tenderness.  Abdominal:     General: There is no distension.     Tenderness: There is no abdominal tenderness. There is no rebound.  Musculoskeletal:        General: Normal range of motion.     Cervical back: Neck supple.  Lymphadenopathy:     Cervical: No cervical adenopathy.  Skin:    Findings: No erythema or rash.  Neurological:     Mental Status: She is alert and oriented to person, place, and time.     Motor: No abnormal muscle tone.     Coordination: Coordination normal.  Psychiatric:        Behavior: Behavior normal.     ED Results / Procedures / Treatments   Labs (all labs ordered are listed, but only abnormal results are displayed) Labs Reviewed  BASIC METABOLIC PANEL WITH GFR - Abnormal; Notable for the following components:      Result Value   Glucose, Bld 111 (*)    Calcium  8.7 (*)    All other components within normal limits  CBC WITH DIFFERENTIAL/PLATELET    EKG None  Radiology CT Head Wo Contrast Result Date: 08/18/2023 CLINICAL DATA:  08/29/2022 EXAM: CT HEAD WITHOUT CONTRAST TECHNIQUE: Contiguous axial images were obtained from the base of the skull through the vertex without intravenous contrast. RADIATION DOSE REDUCTION: This exam was performed  according to the departmental dose-optimization program which includes automated exposure control, adjustment of the mA and/or kV according to patient size and/or use of iterative reconstruction technique. COMPARISON:  08/29/2022 FINDINGS: Brain: No evidence of acute infarction, hemorrhage, hydrocephalus, extra-axial collection or mass lesion/mass effect. Low-density in the cerebral white matter with chronic lacunar infarcts in the left thalamus and right caudate head. Vascular: Extensive atheromatous calcification.  Basilar stenting. Skull: No acute finding Sinuses/Orbits: No acute finding IMPRESSION: No acute finding. Chronic small  vessel ischemia without detected progression from last year. Electronically Signed   By: Ronnette Coke M.D.   On: 08/18/2023 10:09    Procedures Procedures    Medications Ordered in ED Medications  metoprolol  tartrate (LOPRESSOR ) tablet 12.5 mg (12.5 mg Oral Given 08/18/23 4010)    ED Course/ Medical Decision Making/ A&P                                 Medical Decision Making Amount and/or Complexity of Data Reviewed Labs: ordered. Radiology: ordered.  Risk Prescription drug management.   Patient with complaint of congested head.  CT scan unremarkable.  Patient is already on Claritin  daily.  She would just follow-up with her PCP and take Tylenol  if needed        Final Clinical Impression(s) / ED Diagnoses Final diagnoses:  Sinus headache    Rx / DC Orders ED Discharge Orders     None         Cheyenne Cotta, MD 08/21/23 1203

## 2023-08-18 NOTE — Discharge Instructions (Addendum)
 Follow-up with your family doctor next week for recheck.  Stay hydrated

## 2023-09-06 NOTE — Progress Notes (Unsigned)
 Cardiology Clinic Note   Patient Name: Anita Fletcher Date of Encounter: 09/06/2023  Primary Care Provider:  Anthon Kins, MD Primary Cardiologist:  Oneil Bigness, MD  Patient Profile    Anita Fletcher 78 year old female presents the clinic today for follow-up evaluation of her paroxysmal atrial fibrillation and hypertension.  Past Medical History    Past Medical History:  Diagnosis Date   Acute CVA (cerebrovascular accident) (HCC) 01/27/2022   Arm pain 07/09/2022   Started around mid March woke her up feels like broken arm in the right midshaft humerus feels like muscle pain feels like tightness in the shoulder feels like numbness in the hand Is not clear whether moving the arm aggravates the pain she says it does not hurt more but it does not help it She has not been able to identify anything that relieves the pain or triggers the pain The pain is like a   Hearing loss 07/10/2022   Started to be problematic a couple months after cerebrovascular accident   HTN (hypertension) 01/27/2022   Leg swelling 06/18/2022   Ms.Damian noticed a few weeks ago. Patient complains of numbness sensation without loss of sensation I closely evaluated her legs and she has bilateral mild pitting edema 2+ with increased sensation of edema on the right leg but I cannot really tell the difference.  There is no tenderness in her calves there is excellent circulation and pulse on bilateral dorsal pedis and posterior tibialis, excell   Neuropathic pain 04/03/2022   She is a lot more pain in her right hand that is nerve pain so I suspect she is recovering some from her stroke but I advised that she increase her gabapentin  it even though she rather avoid that because of the drowsiness am sending in a higher dose so that she at least has it available for use if the pain gets too much.  I okayed her to return to work.  I offered to send her to physical therapy a   Neuropathy 06/18/2022   Sensation of numbness  R leg only First noticed with swelling 05/2022, seemed disconnected to her CVA October to November. 2023     PAF (paroxysmal atrial fibrillation) (HCC)    TIA (transient ischemic attack)    Past Surgical History:  Procedure Laterality Date   EYE SURGERY     IR ANGIO INTRA EXTRACRAN SEL COM CAROTID INNOMINATE BILAT MOD SED  01/30/2022   IR ANGIO VERTEBRAL SEL VERTEBRAL UNI L MOD SED  01/30/2022   IR ANGIO VERTEBRAL SEL VERTEBRAL UNI L MOD SED  01/31/2022   IR ANGIOGRAM FOLLOW UP STUDY  01/31/2022   IR CT HEAD LTD  01/31/2022   IR INTRA CRAN STENT  01/31/2022   IR US  GUIDE VASC ACCESS LEFT  01/31/2022   IR US  GUIDE VASC ACCESS RIGHT  01/30/2022   RADIOLOGY WITH ANESTHESIA N/A 01/30/2022   Procedure: MRI WITH ANESTHESIA - BRAIN W/O CONSTRAST;  Surgeon: Radiologist, Medication, MD;  Location: [facility] OR;  Service: Radiology;  Laterality: N/A;   RADIOLOGY WITH ANESTHESIA N/A 01/31/2022   Procedure: RADIOLOGY WITH ANESTHESIA;  Surgeon: Radiologist, Medication, MD;  Location: [facility] OR;  Service: Radiology;  Laterality: N/A;    Allergies  Allergies  Allergen Reactions   Codeine     History of Present Illness    Anita Fletcher has a PMH of paroxysmal atrial fibrillation hypertension, CVA with residual deficit, hypokalemia, insomnia, constipation, and neuropathic pain.  CHA2DS2-VASc score 3 (age,  hypertension, female).  Echocardiogram showed normal LV function.  She was seen in follow-up by Dr. Rolm Clos on 01/16/2022.  During that time there were concerns for anterior ischemia on her nuclear medicine stress test.  Her previous monitor showed no recurrent atrial fibrillation.  Her blood pressure was elevated at 160/100.  She indicated that her blood pressure was also elevated at home.  She denied recurrent atrial fibrillation/palpitations.  She was using a [place] mobile device.  She denied chest pain and trouble with her breathing.  She did not have formal exercise routine.  She reported compliance with apixaban   and denied bleeding issues.  A BMP and coronary CTA were ordered but not completed.  Losartan  50 mg daily was also prescribed.    She presented to the clinic 05/16/22 for follow-up evaluation and stated she had two CVA's.  Her first CVA was October 30.  She was undergoing carotid endarterectomy and had a second CVA during the procedure.  She was then sent to rehab and is not driving at this time.  She was unable to complete coronary CTA.  We reviewed her previous stress testing and she expressed understanding.  She did not feel that she was taking metoprolol  and had noted episodes of atrial fibrillation.  I started metoprolol  tartrate 12.5 mg daily and  instructed her that she may take an extra 12.5 mg for episodes of sustained palpitations.  We reviewed triggers for atrial fibrillation.  I will plan follow-up in 3 to 4 months.    She presented to the clinic 08/19/22 for follow-up evaluation and stated her blood pressure had been elevated in the 150s-160 systolic at home.  She continued to work on physical therapy movements for her right side.  She was regaining sensation in her right arm.  She reported that she would have a CT which was scheduled for the 31st.  We reviewed need for coronary CTA.  She wished to defer at this time due to not being able to drive.  I stopped her losartan  and started valsartan  80 mg.  Her repeat lab work was stable.  She presents to the clinic today for follow-up evaluation states***.  Today she denies chest pain, shortness of breath, lower extremity edema, fatigue, palpitations, melena, hematuria, hemoptysis, diaphoresis, weakness, presyncope, syncope, orthopnea, and PND.   Home Medications    Prior to Admission medications   Medication Sig Start Date End Date Taking? Authorizing Provider  amLODipine  (NORVASC ) 5 MG tablet Take 1 tablet (5 mg total) by mouth daily. 02/28/22   Anthon Kins, MD  apixaban  (ELIQUIS ) 5 MG TABS tablet Take 1 tablet (5 mg total) by mouth 2  (two) times daily. 02/28/22   Anthon Kins, MD  atorvastatin  (LIPITOR) 40 MG tablet Take 1 tablet (40 mg total) by mouth daily. 02/28/22   Anthon Kins, MD  diclofenac  Sodium (VOLTAREN ) 1 % GEL Apply 2 g topically 4 (four) times daily. 02/13/22   Love, Renay Carota, PA-C  fluticasone  (FLONASE ) 50 MCG/ACT nasal spray Use 1 spray in each nostril once daily. 02/14/22   Love, Renay Carota, PA-C  gabapentin  (NEURONTIN ) 100 MG capsule Take 1 capsule (100 mg total) by mouth 3 (three) times daily. 04/03/22   Anthon Kins, MD  lidocaine  (XYLOCAINE ) 5 % ointment Apply 1 Application topically as needed. 04/03/22   Anthon Kins, MD  loratadine  (CLARITIN ) 10 MG tablet Take 1 tablet (10 mg total) by mouth daily. 03/18/22   Anthon Kins, MD  losartan  (COZAAR )  100 MG tablet Take 1 tablet (100 mg total) by mouth daily. 04/03/22   Anthon Kins, MD  melatonin 5 MG TABS Take 1 tablet (5 mg total) by mouth at bedtime. 02/28/22   Anthon Kins, MD  metoprolol  tartrate (LOPRESSOR ) 25 MG tablet Take 25 mg by mouth daily. 04/23/22   [provider]  potassium chloride  (KLOR-CON  M) 10 MEQ tablet Take 1 tablet (10 mEq total) by mouth daily. 03/18/22   Anthon Kins, MD  senna-docusate (SENOKOT-S) 8.6-50 MG tablet Take 1 tablet by mouth 2 (two) times daily. 02/28/22   Anthon Kins, MD  ticagrelor  (BRILINTA ) 90 MG TABS tablet Take 1 tablet (90 mg total) by mouth 2 (two) times daily. 02/28/22   Anthon Kins, MD  DULoxetine  (CYMBALTA ) 30 MG capsule Take 30 mg by mouth daily. Then increase to two 30 mg tablets (total 60 mg) by mouth once daily  04/17/22  [provider]    Family History    Family History  Problem Relation Age of Onset   Alcoholism Father    Arrhythmia Brother    Diabetes Brother    Mental illness Brother    She indicated that her mother is deceased. She indicated that her father is deceased. She indicated that only one of her two brothers is alive.  Social History     Social History   Socioeconomic History   Marital status: Single    Spouse name: Not on file   Number of children: 0   Years of education: Not on file   Highest education level: Some college, no degree  Occupational History   Occupation: Resume building  Tobacco Use   Smoking status: Never   Smokeless tobacco: Never  Vaping Use   Vaping status: Never Used  Substance and Sexual Activity   Alcohol use: No   Drug use: No   Sexual activity: Not on file  Other Topics Concern   Not on file  Social History Narrative   Not on file   Social Drivers of Health   Financial Resource Strain: Low Risk  (06/13/2023)   Overall Financial Resource Strain (CARDIA)    Difficulty of Paying Living Expenses: Not very hard  Food Insecurity: No Food Insecurity (06/13/2023)   Hunger Vital Sign    Worried About Running Out of Food in the Last Year: Never true    Ran Out of Food in the Last Year: Never true  Transportation Needs: Unmet Transportation Needs (06/13/2023)   PRAPARE - Administrator, Civil Service (Medical): Yes    Lack of Transportation (Non-Medical): No  Physical Activity: Unknown (06/13/2023)   Exercise Vital Sign    Days of Exercise per Week: 0 days    Minutes of Exercise per Session: Not on file  Stress: Stress Concern Present (06/13/2023)   [employer] of Occupational Health - Occupational Stress Questionnaire    Feeling of Stress : To some extent  Social Connections: Socially Isolated (06/13/2023)   Social Connection and Isolation Panel [NHANES]    Frequency of Communication with Friends and Family: Three times a week    Frequency of Social Gatherings with Friends and Family: Never    Attends Religious Services: Never    Database administrator or Organizations: No    Attends Banker Meetings: Not on file    Marital Status: Never married  Intimate Partner Violence: Not At Risk (01/29/2022)   Humiliation, Afraid, Rape, and Kick questionnaire  Fear  of Current or Ex-Partner: No    Emotionally Abused: No    Physically Abused: No    Sexually Abused: No     Review of Systems    General:  No chills, fever, night sweats or weight changes.  Cardiovascular:  No chest pain, dyspnea on exertion, edema, orthopnea, palpitations, paroxysmal nocturnal dyspnea. Dermatological: No rash, lesions/masses Respiratory: No cough, dyspnea Urologic: No hematuria, dysuria Abdominal:   No nausea, vomiting, diarrhea, bright red blood per rectum, melena, or hematemesis Neurologic:  No visual changes, wkns, changes in mental status. All other systems reviewed and are otherwise negative except as noted above.  Physical Exam    VS:  There were no vitals taken for this visit. , BMI There is no height or weight on file to calculate BMI. GEN: Well nourished, well developed, in no acute distress. HEENT: normal. Neck: Supple, no JVD, carotid bruits, or masses. Cardiac: RRR, no murmurs, rubs, or gallops. No clubbing, cyanosis, edema.  Radials/DP/PT 2+ and equal bilaterally.  Respiratory:  Respirations regular and unlabored, clear to auscultation bilaterally. GI: Soft, nontender, nondistended, BS + x 4. MS: no deformity or atrophy. Skin: warm and dry, no rash. Neuro:  Strength and sensation are intact. Psych: Normal affect.  Accessory Clinical Findings    Recent Labs: 08/18/2023: BUN 11; Creatinine, Ser 0.82; Hemoglobin 14.1; Platelets 252; Potassium 3.6; Sodium 136   Recent Lipid Panel    Component Value Date/Time   CHOL 226 (H) 01/28/2022 0319   TRIG 253 (H) 01/28/2022 0319   HDL 35 (L) 01/28/2022 0319   CHOLHDL 6.5 01/28/2022 0319   VLDL 51 (H) 01/28/2022 0319   LDLCALC 140 (H) 01/28/2022 0319    No BP recorded.  {Refresh Note OR Click here to enter BP  :1}***    ECG personally reviewed by me today-none today.  Echocardiogram 01/28/2022 IMPRESSIONS     1. Limited study.   2. Left ventricular ejection fraction, by estimation, is 70 to 75%.  The  left ventricle has hyperdynamic function. 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 Left ventricular diastolic  function could not be evaluated.   3. The mitral valve is abnormal, mild to moderate annular calcification.   4. The aortic valve is tricuspid. Aortic valve sclerosis is present, with  no evidence of aortic valve stenosis.   5. The inferior vena cava is normal in size with greater than 50%  respiratory variability, suggesting right atrial pressure of 3 mmHg.   FINDINGS   Left Ventricle: Left ventricular ejection fraction, by estimation, is 70  to 75%. The left ventricle has hyperdynamic function. The left ventricular  internal cavity size was normal in size. There is severe asymmetric left  ventricular hypertrophy of the  basal segment. Left ventricular diastolic function could not be evaluated.   Pericardium: There is no evidence of pericardial effusion. Presence of  epicardial fat layer.   Mitral Valve: The mitral valve is abnormal. Mild to moderate mitral  annular calcification.   Tricuspid Valve: The tricuspid valve is grossly normal.   Aortic Valve: The aortic valve is tricuspid. There is mild aortic valve  annular calcification. Aortic valve sclerosis is present, with no evidence  of aortic valve stenosis.   Aorta: The aortic root is normal in size and structure.   Venous: The inferior vena cava is normal in size with greater than 50%  respiratory variability, suggesting right atrial pressure of 3 mmHg.   IAS/Shunts: The interatrial septum was  not assessed.    Nuclear stress test 12/17/2021    Findings are consistent with ischemia. The study is low-intermediate risk.   No ST deviation was noted. ECG rhythm shows sinus bradycardia at rest.   LV perfusion is abnormal. There is evidence of ischemia. Defect 1: There is a small defect with moderate reduction in uptake present in the apical to mid anterior location(s) that is  partially reversible (see graphic). There is abnormal wall motion in the defect area. Consistent with ischemia given anteroapical wall motion abnormality.   Left ventricular function is grossly normal. Nuclear stress EF: 50 %, however visually appears closer to 60%.  End diastolic cavity size is normal. End systolic cavity size is normal.   Prior study not available for comparison.  Assessment & Plan   1.  Abnormal cardiac stress test-denies exertional chest discomfort.  Previously she was noted to have mild anterior defect on nuclear stress testing.  It was felt that this may be related to artifact.  CCTA was ordered for further evaluation but not completed.  She has history of CVA x 2 at the end of 2023.  Also, CVA October 30 and post CVA she underwent  carotid stenting and had second CVA she is right side affected.    Order coronary CTA  Essential hypertension-BP today 15***2/86.   Continue   amlodipine , metoprolol , valsartan  Maintain blood pressure log Heart healthy diet  Paroxysmal atrial fibrillation-heart rate today 5***7 bpm.  Continues to report compliance with apixaban .  Denies trauma and bleeding issues.   Denies sustained irregular heartbeats or palpitations. Continue  apixaban , metoprolol  12.5 mg daily.  May take an extra 12.5 mg for sustained palpitations.-Again reviewed Heart healthy low-sodium diet-continue Avoid triggers caffeine, chocolate, EtOH, dehydration etc.-reviewed      Disposition: Follow-up with Dr. Rolm Clos or me after testing 1-2 months  Chet Cota. Rami Budhu NP-C     09/06/2023, 7:11 PM [facility] [address] Office [phone] Fax [phone]    I spent 14*** minutes examining this patient, reviewing medications, and using patient centered shared decision making involving her cardiac care.  Prior to her visit I spent greater than 20 minutes reviewing her past medical history,  medications, and prior cardiac tests.

## 2023-09-09 ENCOUNTER — Ambulatory Visit: Attending: General Practice | Admitting: General Practice

## 2023-09-09 ENCOUNTER — Encounter: Payer: Self-pay | Admitting: General Practice

## 2023-09-09 VITALS — BP 136/76 | HR 60 | Ht 63.5 in | Wt 161.0 lb

## 2023-09-09 DIAGNOSIS — R9439 Abnormal result of other cardiovascular function study: Secondary | ICD-10-CM

## 2023-09-09 DIAGNOSIS — I10 Essential (primary) hypertension: Secondary | ICD-10-CM | POA: Diagnosis not present

## 2023-09-09 DIAGNOSIS — I480 Paroxysmal atrial fibrillation: Secondary | ICD-10-CM | POA: Diagnosis not present

## 2023-09-09 MED ORDER — VALSARTAN 80 MG PO TABS: 160.0000 mg | ORAL_TABLET | Freq: Every day | ORAL | 3 refills | Status: DC

## 2023-09-09 NOTE — Patient Instructions (Addendum)
 Medication Instructions:  INCREASE Valsartan  to 160 mg daily  *If you need a refill on your cardiac medications before your next appointment, please call your pharmacy*  Lab Work: [facility] lab work in 1 week (09/16/2023)  If you have labs (blood work) drawn today and your tests are completely normal, you will receive your results only by: MyChart Message (if you have MyChart) OR A paper copy in the mail If you have any lab test that is abnormal or we need to change your treatment, we will call you to review the results.  Testing/Procedures: No further testing at this time  Follow-Up: At [facility], you and your health needs are our priority.  As part of our continuing mission to provide you with exceptional heart care, our providers are all part of one team.  This team includes your primary Cardiologist (physician) and Advanced Practice Providers or APPs (Physician Assistants and Nurse Practitioners) who all work together to provide you with the care you need, when you need it.  Your next appointment:   1-2 month(s)  Provider:   Follow-up with Lawana Pray, NP or Dr. Rolm Clos in 1 to 2 months    We recommend signing up for the patient portal called MyChart.  Sign up information is provided on this After Visit Summary.  MyChart is used to connect with patients for Virtual Visits (Telemedicine).  Patients are able to view lab/test results, encounter notes, upcoming appointments, etc.  Non-urgent messages can be sent to your provider as well.   To learn more about what you can do with MyChart, go to [email or web address].   Other Instructions Continue heart healthy low-sodium diet Increase physical activity as tolerated Maintain blood pressure log

## 2023-09-11 ENCOUNTER — Other Ambulatory Visit: Payer: Self-pay | Admitting: Internal Medicine

## 2023-09-11 DIAGNOSIS — E876 Hypokalemia: Secondary | ICD-10-CM

## 2023-10-04 ENCOUNTER — Other Ambulatory Visit: Payer: Self-pay | Admitting: Internal Medicine

## 2023-10-04 DIAGNOSIS — I480 Paroxysmal atrial fibrillation: Secondary | ICD-10-CM

## 2023-11-07 ENCOUNTER — Other Ambulatory Visit: Payer: Self-pay | Admitting: Cardiovascular Disease

## 2023-11-11 NOTE — Progress Notes (Deleted)
 Cardiology Clinic Note   Patient Name: Anita Fletcher Date of Encounter: 11/11/2023  Primary Care Provider:  Jesus Fletcher KANDICE, MD Primary Cardiologist:  Anita Fletcher Decent, MD  Patient Profile    Anita Fletcher 78 year old female presents the clinic today for follow-up evaluation of her paroxysmal atrial fibrillation and hypertension.  Past Medical History    Past Medical History:  Diagnosis Date   Acute CVA (cerebrovascular accident) (HCC) 01/27/2022   Arm pain 07/09/2022   Started around mid March woke her up feels like broken arm in the right midshaft humerus feels like muscle pain feels like tightness in the shoulder feels like numbness in the hand Is not clear whether moving the arm aggravates the pain she says it does not hurt more but it does not help it She has not been able to identify anything that relieves the pain or triggers the pain The pain is like a   Hearing loss 07/10/2022   Started to be problematic a couple months after cerebrovascular accident   HTN (hypertension) 01/27/2022   Leg swelling 06/18/2022   Anita Fletcher noticed a few weeks ago. Patient complains of numbness sensation without loss of sensation I closely evaluated her legs and she has bilateral mild pitting edema 2+ with increased sensation of edema on the right leg but I cannot really tell the difference.  There is no tenderness in her calves there is excellent circulation and pulse on bilateral dorsal pedis and posterior tibialis, excell   Neuropathic pain 04/03/2022   She is a lot more pain in her right hand that is nerve pain so I suspect she is recovering some from her stroke but I advised that she increase her gabapentin  it even though she rather avoid that because of the drowsiness am sending in a higher dose so that she at least has it available for use if the pain gets too much.  I okayed her to return to work.  I offered to send her to physical therapy a   Neuropathy 06/18/2022   Sensation of  numbness R leg only First noticed with swelling 05/2022, seemed disconnected to her CVA October to November. 2023     PAF (paroxysmal atrial fibrillation) (HCC)    TIA (transient ischemic attack)    Past Surgical History:  Procedure Laterality Date   EYE SURGERY     IR ANGIO INTRA EXTRACRAN SEL COM CAROTID INNOMINATE BILAT MOD SED  01/30/2022   IR ANGIO VERTEBRAL SEL VERTEBRAL UNI L MOD SED  01/30/2022   IR ANGIO VERTEBRAL SEL VERTEBRAL UNI L MOD SED  01/31/2022   IR ANGIOGRAM FOLLOW UP STUDY  01/31/2022   IR CT HEAD LTD  01/31/2022   IR INTRA CRAN STENT  01/31/2022   IR US  GUIDE VASC ACCESS LEFT  01/31/2022   IR US  GUIDE VASC ACCESS RIGHT  01/30/2022   RADIOLOGY WITH ANESTHESIA N/A 01/30/2022   Procedure: MRI WITH ANESTHESIA - BRAIN W/O CONSTRAST;  Surgeon: Radiologist, Medication, MD;  Location: [facility] OR;  Service: Radiology;  Laterality: N/A;   RADIOLOGY WITH ANESTHESIA N/A 01/31/2022   Procedure: RADIOLOGY WITH ANESTHESIA;  Surgeon: Radiologist, Medication, MD;  Location: [facility] OR;  Service: Radiology;  Laterality: N/A;    Allergies  Allergies  Allergen Reactions   Codeine    Levofloxacin     Other Reaction(s): muscle pains    History of Present Illness    Anita Fletcher has a PMH of paroxysmal atrial fibrillation hypertension, CVA with residual deficit,  hypokalemia, insomnia, constipation, and neuropathic pain.  CHA2DS2-VASc score 3 (age, hypertension, female).  Echocardiogram showed normal LV function.  She was seen in follow-up by Dr. Barbaraann on 01/16/2022.  During that time there were concerns for anterior ischemia on her nuclear medicine stress test.  Her previous monitor showed no recurrent atrial fibrillation.  Her blood pressure was elevated at 160/100.  She indicated that her blood pressure was also elevated at home.  She denied recurrent atrial fibrillation/palpitations.  She was using a [place] mobile device.  She denied chest pain and trouble with her breathing.  She did not have  formal exercise routine.  She reported compliance with apixaban  and denied bleeding issues.  A BMP and coronary CTA were ordered but not completed.  Losartan  50 mg daily was also prescribed.    She presented to the clinic 05/16/22 for follow-up evaluation and stated she had two CVA's.  Her first CVA was October 30.  She was undergoing carotid endarterectomy and had a second CVA during the procedure.  She was then sent to rehab and is not driving at this time.  She was unable to complete coronary CTA.  We reviewed her previous stress testing and she expressed understanding.  She did not feel that she was taking metoprolol  and had noted episodes of atrial fibrillation.  I started metoprolol  tartrate 12.5 mg daily and  instructed her that she may take an extra 12.5 mg for episodes of sustained palpitations.  We reviewed triggers for atrial fibrillation.  I will plan follow-up in 3 to 4 months.    She presented to the clinic 08/19/22 for follow-up evaluation and stated her blood pressure had been elevated in the 150s-160 systolic at home.  She continued to work on physical therapy movements for her right side.  She was regaining sensation in her right arm.  She reported that she would have a CT which was scheduled for the 31st.  We reviewed need for coronary CTA.  She wished to defer at this time due to not being able to drive.  I stopped her losartan  and started valsartan  80 mg.  Her repeat lab work was stable.  She presented to the clinic 09/09/23 for follow-up evaluation stated she noticed a fullness or submersion type sensation in the mornings since having her stroke.  She noted that her blood pressure had been running in the 160 systolic at home.  She had completed physical therapy exercises.  She continued to notice some right upper extremity numbness and pain.  She had not had recent follow-up with her neurologist.  We reviewed her recommendation for coronary CTA.  She wished to defer that at this time.   Initially her blood pressure in clinic today was 164/92 and on recheck it was 136/76.  I reviewed the importance of heart healthy low-sodium diet.  I  increased her valsartan  to 160 mg daily, planned repeat BMP in 1 week and plan follow-up in 1 to 2 months.   Follow-up labs were not completed.  She presents to the clinic today for follow-up evaluation and states***.  Today she denies chest pain, shortness of breath, lower extremity edema, fatigue, palpitations, melena, hematuria, hemoptysis, diaphoresis, weakness, presyncope, syncope, orthopnea, and PND.   Home Medications    Prior to Admission medications   Medication Sig Start Date End Date Taking? Authorizing Provider  amLODipine  (NORVASC ) 5 MG tablet Take 1 tablet (5 mg total) by mouth daily. 02/28/22   Anita Fletcher MATSU, MD  apixaban  (ELIQUIS ) 5  MG TABS tablet Take 1 tablet (5 mg total) by mouth 2 (two) times daily. 02/28/22   Anita Fletcher MATSU, MD  atorvastatin  (LIPITOR) 40 MG tablet Take 1 tablet (40 mg total) by mouth daily. 02/28/22   Anita Fletcher MATSU, MD  diclofenac  Sodium (VOLTAREN ) 1 % GEL Apply 2 g topically 4 (four) times daily. 02/13/22   Love, Sharlet RAMAN, PA-C  fluticasone  (FLONASE ) 50 MCG/ACT nasal spray Use 1 spray in each nostril once daily. 02/14/22   Love, Sharlet RAMAN, PA-C  gabapentin  (NEURONTIN ) 100 MG capsule Take 1 capsule (100 mg total) by mouth 3 (three) times daily. 04/03/22   Anita Fletcher MATSU, MD  lidocaine  (XYLOCAINE ) 5 % ointment Apply 1 Application topically as needed. 04/03/22   Anita Fletcher MATSU, MD  loratadine  (CLARITIN ) 10 MG tablet Take 1 tablet (10 mg total) by mouth daily. 03/18/22   Anita Fletcher MATSU, MD  losartan  (COZAAR ) 100 MG tablet Take 1 tablet (100 mg total) by mouth daily. 04/03/22   Anita Fletcher MATSU, MD  melatonin 5 MG TABS Take 1 tablet (5 mg total) by mouth at bedtime. 02/28/22   Anita Fletcher MATSU, MD  metoprolol  tartrate (LOPRESSOR ) 25 MG tablet Take 25 mg by mouth daily. 04/23/22   [provider]   potassium chloride  (KLOR-CON  M) 10 MEQ tablet Take 1 tablet (10 mEq total) by mouth daily. 03/18/22   Anita Fletcher MATSU, MD  senna-docusate (SENOKOT-S) 8.6-50 MG tablet Take 1 tablet by mouth 2 (two) times daily. 02/28/22   Anita Fletcher MATSU, MD  ticagrelor  (BRILINTA ) 90 MG TABS tablet Take 1 tablet (90 mg total) by mouth 2 (two) times daily. 02/28/22   Anita Fletcher MATSU, MD  DULoxetine  (CYMBALTA ) 30 MG capsule Take 30 mg by mouth daily. Then increase to two 30 mg tablets (total 60 mg) by mouth once daily  04/17/22  [provider]    Family History    Family History  Problem Relation Age of Onset   Alcoholism Father    Arrhythmia Brother    Diabetes Brother    Mental illness Brother    She indicated that her mother is deceased. She indicated that her father is deceased. She indicated that only one of her two brothers is alive.  Social History    Social History   Socioeconomic History   Marital status: Single    Spouse name: Not on file   Number of children: 0   Years of education: Not on file   Highest education level: Some college, no degree  Occupational History   Occupation: Resume building  Tobacco Use   Smoking status: Never   Smokeless tobacco: Never  Vaping Use   Vaping status: Never Used  Substance and Sexual Activity   Alcohol use: No   Drug use: No   Sexual activity: Not on file  Other Topics Concern   Not on file  Social History Narrative   Not on file   Social Drivers of Health   Financial Resource Strain: Low Risk  (06/13/2023)   Overall Financial Resource Strain (CARDIA)    Difficulty of Paying Living Expenses: Not very hard  Food Insecurity: No Food Insecurity (06/13/2023)   Hunger Vital Sign    Worried About Running Out of Food in the Last Year: Never true    Ran Out of Food in the Last Year: Never true  Transportation Needs: Unmet Transportation Needs (06/13/2023)   PRAPARE - Administrator, Civil Service (Medical): Yes  Lack of  Transportation (Non-Medical): No  Physical Activity: Unknown (06/13/2023)   Exercise Vital Sign    Days of Exercise per Week: 0 days    Minutes of Exercise per Session: Not on file  Stress: Stress Concern Present (06/13/2023)   [employer] of Occupational Health - Occupational Stress Questionnaire    Feeling of Stress : To some extent  Social Connections: Socially Isolated (06/13/2023)   Social Connection and Isolation Panel    Frequency of Communication with Friends and Family: Three times a week    Frequency of Social Gatherings with Friends and Family: Never    Attends Religious Services: Never    Database administrator or Organizations: No    Attends Engineer, structural: Not on file    Marital Status: Never married  Intimate Partner Violence: Not At Risk (01/29/2022)   Humiliation, Afraid, Rape, and Kick questionnaire    Fear of Current or Ex-Partner: No    Emotionally Abused: No    Physically Abused: No    Sexually Abused: No     Review of Systems    General:  No chills, fever, night sweats or weight changes.  Cardiovascular:  No chest pain, dyspnea on exertion, edema, orthopnea, palpitations, paroxysmal nocturnal dyspnea. Dermatological: No rash, lesions/masses Respiratory: No cough, dyspnea Urologic: No hematuria, dysuria Abdominal:   No nausea, vomiting, diarrhea, bright red blood per rectum, melena, or hematemesis Neurologic:  No visual changes, wkns, changes in mental status. All other systems reviewed and are otherwise negative except as noted above.  Physical Exam    VS:  There were no vitals taken for this visit. , BMI There is no height or weight on file to calculate BMI. GEN: Well nourished, well developed, in no acute distress. HEENT: normal. Neck: Supple, no JVD, carotid bruits, or masses. Cardiac: RRR, no murmurs, rubs, or gallops. No clubbing, cyanosis, edema.  Radials/DP/PT 2+ and equal bilaterally.  Respiratory:  Respirations regular and  unlabored, clear to auscultation bilaterally. GI: Soft, nontender, nondistended, BS + x 4. MS: no deformity or atrophy. Skin: warm and dry, no rash. Neuro:  Strength and sensation are intact. Psych: Normal affect.  Accessory Clinical Findings    Recent Labs: 08/18/2023: BUN 11; Creatinine, Ser 0.82; Hemoglobin 14.1; Platelets 252; Potassium 3.6; Sodium 136   Recent Lipid Panel    Component Value Date/Time   CHOL 226 (H) 01/28/2022 0319   TRIG 253 (H) 01/28/2022 0319   HDL 35 (L) 01/28/2022 0319   CHOLHDL 6.5 01/28/2022 0319   VLDL 51 (H) 01/28/2022 0319   LDLCALC 140 (H) 01/28/2022 0319    No BP recorded.  {Refresh Note OR Click here to enter BP  :1}***    ECG personally reviewed by me today-EKG Interpretation*** Date/Time:    Ventricular Rate:    PR Interval:    QRS Duration:    QT Interval:    QTC Calculation:   R Axis:      Text Interpretation:     Echocardiogram 01/28/2022 IMPRESSIONS     1. Limited study.   2. Left ventricular ejection fraction, by estimation, is 70 to 75%. The  left ventricle has hyperdynamic function. 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 Left ventricular diastolic  function could not be evaluated.   3. The mitral valve is abnormal, mild to moderate annular calcification.   4. The aortic valve is tricuspid. Aortic valve sclerosis is present, with  no evidence of aortic valve stenosis.  5. The inferior vena cava is normal in size with greater than 50%  respiratory variability, suggesting right atrial pressure of 3 mmHg.   FINDINGS   Left Ventricle: Left ventricular ejection fraction, by estimation, is 70  to 75%. The left ventricle has hyperdynamic function. The left ventricular  internal cavity size was normal in size. There is severe asymmetric left  ventricular hypertrophy of the  basal segment. Left ventricular diastolic function could not be evaluated.   Pericardium: There is no evidence  of pericardial effusion. Presence of  epicardial fat layer.   Mitral Valve: The mitral valve is abnormal. Mild to moderate mitral  annular calcification.   Tricuspid Valve: The tricuspid valve is grossly normal.   Aortic Valve: The aortic valve is tricuspid. There is mild aortic valve  annular calcification. Aortic valve sclerosis is present, with no evidence  of aortic valve stenosis.   Aorta: The aortic root is normal in size and structure.   Venous: The inferior vena cava is normal in size with greater than 50%  respiratory variability, suggesting right atrial pressure of 3 mmHg.   IAS/Shunts: The interatrial septum was not assessed.    Nuclear stress test 12/17/2021    Findings are consistent with ischemia. The study is low-intermediate risk.   No ST deviation was noted. ECG rhythm shows sinus bradycardia at rest.   LV perfusion is abnormal. There is evidence of ischemia. Defect 1: There is a small defect with moderate reduction in uptake present in the apical to mid anterior location(s) that is partially reversible (see graphic). There is abnormal wall motion in the defect area. Consistent with ischemia given anteroapical wall motion abnormality.   Left ventricular function is grossly normal. Nuclear stress EF: 50 %, however visually appears closer to 60%.  End diastolic cavity size is normal. End systolic cavity size is normal.   Prior study not available for comparison.  Assessment & Plan   1.  Essential hypertension-BP today 13***6/76   Continue   amlodipine , metoprolol ,  Continue valsartan  to 160 mg daily  Maintain blood pressure log Heart healthy diet Order BMP  Paroxysmal atrial fibrillation-heart rate today 54***bpm.  Continues to report compliance with apixaban .  Denies trauma and bleeding issues.   Denies sustained irregular heartbeats or palpitations. Continue  apixaban , metoprolol  12.5 mg daily.  May take an extra 12.5 mg for sustained palpitations Heart healthy  low-sodium diet-continue Avoid triggers caffeine, chocolate, EtOH, dehydration etc.-reviewed  Abnormal cardiac stress test-she continues to deny exertional chest discomfort.  Increasing physical activity and tolerating well.  Previously she was noted to have mild anterior defect on nuclear stress testing.  It was felt that this may be related to artifact.  CCTA was ordered for further evaluation but not completed.  She has history of CVA x 2 at the end of 2023.  Also, CVA October 30 and post CVA she underwent  carotid stenting and had second CVA she is right side affected.     Again reviewed recommendation for coronary CTA-***       Disposition: Follow-up with Dr. Barbaraann or me after testing 1-2 months  Josefa HERO. Felina Tello NP-C     11/11/2023, 10:27 AM [facility] [address] Office [phone] Fax [phone]    I spent 14*** minutes examining this patient, reviewing medications, and using patient centered shared decision making involving her cardiac care.  Prior to her visit I spent greater than 20 minutes reviewing her past medical history,  medications, and prior cardiac tests.

## 2023-11-13 ENCOUNTER — Ambulatory Visit: Attending: General Practice | Admitting: General Practice

## 2023-12-30 NOTE — Progress Notes (Deleted)
 Cardiology Clinic Note   Patient Name: Anita Fletcher Date of Encounter: 12/30/2023  Primary Care Provider:  Jesus Bernardino KANDICE, MD Primary Cardiologist:  Darryle ONEIDA Decent, MD  Patient Profile    HELI DINO 78 year old female presents the clinic today for follow-up evaluation of her paroxysmal atrial fibrillation and hypertension.  Past Medical History    Past Medical History:  Diagnosis Date   Acute CVA (cerebrovascular accident) (HCC) 01/27/2022   Arm pain 07/09/2022   Started around mid March woke her up feels like broken arm in the right midshaft humerus feels like muscle pain feels like tightness in the shoulder feels like numbness in the hand Is not clear whether moving the arm aggravates the pain she says it does not hurt more but it does not help it She has not been able to identify anything that relieves the pain or triggers the pain The pain is like a   Hearing loss 07/10/2022   Started to be problematic a couple months after cerebrovascular accident   HTN (hypertension) 01/27/2022   Leg swelling 06/18/2022   Ms.Rikard noticed a few weeks ago. Patient complains of numbness sensation without loss of sensation I closely evaluated her legs and she has bilateral mild pitting edema 2+ with increased sensation of edema on the right leg but I cannot really tell the difference.  There is no tenderness in her calves there is excellent circulation and pulse on bilateral dorsal pedis and posterior tibialis, excell   Neuropathic pain 04/03/2022   She is a lot more pain in her right hand that is nerve pain so I suspect she is recovering some from her stroke but I advised that she increase her gabapentin  it even though she rather avoid that because of the drowsiness am sending in a higher dose so that she at least has it available for use if the pain gets too much.  I okayed her to return to work.  I offered to send her to physical therapy a   Neuropathy 06/18/2022   Sensation of  numbness R leg only First noticed with swelling 05/2022, seemed disconnected to her CVA October to November. 2023     PAF (paroxysmal atrial fibrillation) (HCC)    TIA (transient ischemic attack)    Past Surgical History:  Procedure Laterality Date   EYE SURGERY     IR ANGIO INTRA EXTRACRAN SEL COM CAROTID INNOMINATE BILAT MOD SED  01/30/2022   IR ANGIO VERTEBRAL SEL VERTEBRAL UNI L MOD SED  01/30/2022   IR ANGIO VERTEBRAL SEL VERTEBRAL UNI L MOD SED  01/31/2022   IR ANGIOGRAM FOLLOW UP STUDY  01/31/2022   IR CT HEAD LTD  01/31/2022   IR INTRA CRAN STENT  01/31/2022   IR US  GUIDE VASC ACCESS LEFT  01/31/2022   IR US  GUIDE VASC ACCESS RIGHT  01/30/2022   RADIOLOGY WITH ANESTHESIA N/A 01/30/2022   Procedure: MRI WITH ANESTHESIA - BRAIN W/O CONSTRAST;  Surgeon: Radiologist, Medication, MD;  Location: [facility] OR;  Service: Radiology;  Laterality: N/A;   RADIOLOGY WITH ANESTHESIA N/A 01/31/2022   Procedure: RADIOLOGY WITH ANESTHESIA;  Surgeon: Radiologist, Medication, MD;  Location: [facility] OR;  Service: Radiology;  Laterality: N/A;    Allergies  Allergies  Allergen Reactions   Codeine    Levofloxacin     Other Reaction(s): muscle pains    History of Present Illness    Anita Fletcher has a PMH of paroxysmal atrial fibrillation hypertension, CVA with residual deficit,  hypokalemia, insomnia, constipation, and neuropathic pain.  CHA2DS2-VASc score 3 (age, hypertension, female).  Echocardiogram showed normal LV function.  She was seen in follow-up by Dr. Barbaraann on 01/16/2022.  During that time there were concerns for anterior ischemia on her nuclear medicine stress test.  Her previous monitor showed no recurrent atrial fibrillation.  Her blood pressure was elevated at 160/100.  She indicated that her blood pressure was also elevated at home.  She denied recurrent atrial fibrillation/palpitations.  She was using a [place] mobile device.  She denied chest pain and trouble with her breathing.  She did not have  formal exercise routine.  She reported compliance with apixaban  and denied bleeding issues.  A BMP and coronary CTA were ordered but not completed.  Losartan  50 mg daily was also prescribed.    She presented to the clinic 05/16/22 for follow-up evaluation and stated she had two CVA's.  Her first CVA was October 30.  She was undergoing carotid endarterectomy and had a second CVA during the procedure.  She was then sent to rehab and is not driving at this time.  She was unable to complete coronary CTA.  We reviewed her previous stress testing and she expressed understanding.  She did not feel that she was taking metoprolol  and had noted episodes of atrial fibrillation.  I started metoprolol  tartrate 12.5 mg daily and  instructed her that she may take an extra 12.5 mg for episodes of sustained palpitations.  We reviewed triggers for atrial fibrillation.  I will plan follow-up in 3 to 4 months.    She presented to the clinic 08/19/22 for follow-up evaluation and stated her blood pressure had been elevated in the 150s-160 systolic at home.  She continued to work on physical therapy movements for her right side.  She was regaining sensation in her right arm.  She reported that she would have a CT which was scheduled for the 31st.  We reviewed need for coronary CTA.  She wished to defer at this time due to not being able to drive.  I stopped her losartan  and started valsartan  80 mg.  Her repeat lab work was stable.  She presents to the clinic today for follow-up evaluation states she notices a fullness or submersion type sensation in the mornings since having her stroke.  She notes that her blood pressure has been running in the 160 systolic at home.  She has completed physical therapy exercises.  She continues to notice some right upper extremity numbness and pain.  She has not had recent follow-up with her neurologist.  We reviewed her recommendation for coronary CTA.  She wishes to defer that at this time.   Initially her blood pressure in clinic today is 164/92 and on recheck it is 136/76.  I reviewed the importance of heart healthy low-sodium diet.  I will increase her valsartan  to 160 mg daily, repeat BMP in 1 week and plan follow-up in 1 to 2 months..  Today she denies chest pain, shortness of breath, lower extremity edema, fatigue, palpitations, melena, hematuria, hemoptysis, diaphoresis, weakness, presyncope, syncope, orthopnea, and PND.   Home Medications    Prior to Admission medications   Medication Sig Start Date End Date Taking? Authorizing Provider  amLODipine  (NORVASC ) 5 MG tablet Take 1 tablet (5 mg total) by mouth daily. 02/28/22   Jesus Bernardino MATSU, MD  apixaban  (ELIQUIS ) 5 MG TABS tablet Take 1 tablet (5 mg total) by mouth 2 (two) times daily. 02/28/22   Jesus Bernardino  G, MD  atorvastatin  (LIPITOR) 40 MG tablet Take 1 tablet (40 mg total) by mouth daily. 02/28/22   Jesus Bernardino MATSU, MD  diclofenac  Sodium (VOLTAREN ) 1 % GEL Apply 2 g topically 4 (four) times daily. 02/13/22   Love, Sharlet RAMAN, PA-C  fluticasone  (FLONASE ) 50 MCG/ACT nasal spray Use 1 spray in each nostril once daily. 02/14/22   Love, Sharlet RAMAN, PA-C  gabapentin  (NEURONTIN ) 100 MG capsule Take 1 capsule (100 mg total) by mouth 3 (three) times daily. 04/03/22   Jesus Bernardino MATSU, MD  lidocaine  (XYLOCAINE ) 5 % ointment Apply 1 Application topically as needed. 04/03/22   Jesus Bernardino MATSU, MD  loratadine  (CLARITIN ) 10 MG tablet Take 1 tablet (10 mg total) by mouth daily. 03/18/22   Jesus Bernardino MATSU, MD  losartan  (COZAAR ) 100 MG tablet Take 1 tablet (100 mg total) by mouth daily. 04/03/22   Jesus Bernardino MATSU, MD  melatonin 5 MG TABS Take 1 tablet (5 mg total) by mouth at bedtime. 02/28/22   Jesus Bernardino MATSU, MD  metoprolol  tartrate (LOPRESSOR ) 25 MG tablet Take 25 mg by mouth daily. 04/23/22   [provider]  potassium chloride  (KLOR-CON  M) 10 MEQ tablet Take 1 tablet (10 mEq total) by mouth daily. 03/18/22   Jesus Bernardino MATSU, MD   senna-docusate (SENOKOT-S) 8.6-50 MG tablet Take 1 tablet by mouth 2 (two) times daily. 02/28/22   Jesus Bernardino MATSU, MD  ticagrelor  (BRILINTA ) 90 MG TABS tablet Take 1 tablet (90 mg total) by mouth 2 (two) times daily. 02/28/22   Jesus Bernardino MATSU, MD  DULoxetine  (CYMBALTA ) 30 MG capsule Take 30 mg by mouth daily. Then increase to two 30 mg tablets (total 60 mg) by mouth once daily  04/17/22  [provider]    Family History    Family History  Problem Relation Age of Onset   Alcoholism Father    Arrhythmia Brother    Diabetes Brother    Mental illness Brother    She indicated that her mother is deceased. She indicated that her father is deceased. She indicated that only one of her two brothers is alive.  Social History    Social History   Socioeconomic History   Marital status: Single    Spouse name: Not on file   Number of children: 0   Years of education: Not on file   Highest education level: Some college, no degree  Occupational History   Occupation: Resume building  Tobacco Use   Smoking status: Never   Smokeless tobacco: Never  Vaping Use   Vaping status: Never Used  Substance and Sexual Activity   Alcohol use: No   Drug use: No   Sexual activity: Not on file  Other Topics Concern   Not on file  Social History Narrative   Not on file   Social Drivers of Health   Financial Resource Strain: Low Risk  (06/13/2023)   Overall Financial Resource Strain (CARDIA)    Difficulty of Paying Living Expenses: Not very hard  Food Insecurity: No Food Insecurity (06/13/2023)   Hunger Vital Sign    Worried About Running Out of Food in the Last Year: Never true    Ran Out of Food in the Last Year: Never true  Transportation Needs: Unmet Transportation Needs (06/13/2023)   PRAPARE - Administrator, Civil Service (Medical): Yes    Lack of Transportation (Non-Medical): No  Physical Activity: Unknown (06/13/2023)   Exercise Vital Sign    Days of  Exercise per  Week: 0 days    Minutes of Exercise per Session: Not on file  Stress: Stress Concern Present (06/13/2023)   [employer] of Occupational Health - Occupational Stress Questionnaire    Feeling of Stress : To some extent  Social Connections: Socially Isolated (06/13/2023)   Social Connection and Isolation Panel    Frequency of Communication with Friends and Family: Three times a week    Frequency of Social Gatherings with Friends and Family: Never    Attends Religious Services: Never    Database administrator or Organizations: No    Attends Engineer, structural: Not on file    Marital Status: Never married  Intimate Partner Violence: Not At Risk (01/29/2022)   Humiliation, Afraid, Rape, and Kick questionnaire    Fear of Current or Ex-Partner: No    Emotionally Abused: No    Physically Abused: No    Sexually Abused: No     Review of Systems    General:  No chills, fever, night sweats or weight changes.  Cardiovascular:  No chest pain, dyspnea on exertion, edema, orthopnea, palpitations, paroxysmal nocturnal dyspnea. Dermatological: No rash, lesions/masses Respiratory: No cough, dyspnea Urologic: No hematuria, dysuria Abdominal:   No nausea, vomiting, diarrhea, bright red blood per rectum, melena, or hematemesis Neurologic:  No visual changes, wkns, changes in mental status. All other systems reviewed and are otherwise negative except as noted above.  Physical Exam    VS:  There were no vitals taken for this visit. , BMI There is no height or weight on file to calculate BMI. GEN: Well nourished, well developed, in no acute distress. HEENT: normal. Neck: Supple, no JVD, carotid bruits, or masses. Cardiac: RRR, no murmurs, rubs, or gallops. No clubbing, cyanosis, edema.  Radials/DP/PT 2+ and equal bilaterally.  Respiratory:  Respirations regular and unlabored, clear to auscultation bilaterally. GI: Soft, nontender, nondistended, BS + x 4. MS: no deformity or  atrophy. Skin: warm and dry, no rash. Neuro:  Strength and sensation are intact. Psych: Normal affect.  Accessory Clinical Findings    Recent Labs: 08/18/2023: BUN 11; Creatinine, Ser 0.82; Hemoglobin 14.1; Platelets 252; Potassium 3.6; Sodium 136   Recent Lipid Panel    Component Value Date/Time   CHOL 226 (H) 01/28/2022 0319   TRIG 253 (H) 01/28/2022 0319   HDL 35 (L) 01/28/2022 0319   CHOLHDL 6.5 01/28/2022 0319   VLDL 51 (H) 01/28/2022 0319   LDLCALC 140 (H) 01/28/2022 0319    No BP recorded.  {Refresh Note OR Click here to enter BP  :1}***    ECG personally reviewed by me today-EKG Interpretation Date/Time:    Ventricular Rate:    PR Interval:    QRS Duration:    QT Interval:    QTC Calculation:   R Axis:      Text Interpretation:     Echocardiogram 01/28/2022 IMPRESSIONS     1. Limited study.   2. Left ventricular ejection fraction, by estimation, is 70 to 75%. The  left ventricle has hyperdynamic function. 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 Left ventricular diastolic  function could not be evaluated.   3. The mitral valve is abnormal, mild to moderate annular calcification.   4. The aortic valve is tricuspid. Aortic valve sclerosis is present, with  no evidence of aortic valve stenosis.   5. The inferior vena cava is normal in size with greater than 50%  respiratory variability, suggesting right atrial  pressure of 3 mmHg.   FINDINGS   Left Ventricle: Left ventricular ejection fraction, by estimation, is 70  to 75%. The left ventricle has hyperdynamic function. The left ventricular  internal cavity size was normal in size. There is severe asymmetric left  ventricular hypertrophy of the  basal segment. Left ventricular diastolic function could not be evaluated.   Pericardium: There is no evidence of pericardial effusion. Presence of  epicardial fat layer.   Mitral Valve: The mitral valve is abnormal. Mild to  moderate mitral  annular calcification.   Tricuspid Valve: The tricuspid valve is grossly normal.   Aortic Valve: The aortic valve is tricuspid. There is mild aortic valve  annular calcification. Aortic valve sclerosis is present, with no evidence  of aortic valve stenosis.   Aorta: The aortic root is normal in size and structure.   Venous: The inferior vena cava is normal in size with greater than 50%  respiratory variability, suggesting right atrial pressure of 3 mmHg.   IAS/Shunts: The interatrial septum was not assessed.    Nuclear stress test 12/17/2021    Findings are consistent with ischemia. The study is low-intermediate risk.   No ST deviation was noted. ECG rhythm shows sinus bradycardia at rest.   LV perfusion is abnormal. There is evidence of ischemia. Defect 1: There is a small defect with moderate reduction in uptake present in the apical to mid anterior location(s) that is partially reversible (see graphic). There is abnormal wall motion in the defect area. Consistent with ischemia given anteroapical wall motion abnormality.   Left ventricular function is grossly normal. Nuclear stress EF: 50 %, however visually appears closer to 60%.  End diastolic cavity size is normal. End systolic cavity size is normal.   Prior study not available for comparison.  Assessment & Plan   1.  Abnormal cardiac stress test-denies exertional chest discomfort.  Previously she was noted to have mild anterior defect on nuclear stress testing.  It was felt that this may be related to artifact.  CCTA was ordered for further evaluation but not completed.  She has history of CVA x 2 at the end of 2023.  Also, CVA October 30 and post CVA she underwent  carotid stenting and had second CVA she is right side affected.     She was to coronary CT at this time.  Essential hypertension-BP today 136/76   Continue   amlodipine , metoprolol ,  Increase valsartan  to 160 mg daily  Maintain blood pressure  log Heart healthy diet  Paroxysmal atrial fibrillation-heart rate today 54 bpm.  Continues to report compliance with apixaban .  Denies trauma and bleeding issues.   Denies sustained irregular heartbeats or palpitations. Continue  apixaban , metoprolol  12.5 mg daily.  May take an extra 12.5 mg for sustained palpitations.-Again reviewed Heart healthy low-sodium diet-continue Avoid triggers caffeine, chocolate, EtOH, dehydration etc.-reviewed      Disposition: Follow-up with Dr. Barbaraann or me after testing 1-2 months  Josefa HERO. Joani Cosma NP-C     12/30/2023, 7:33 AM [facility] [address] Office [phone] Fax [phone]    I spent 14 *** minutes examining this patient, reviewing medications, and using patient centered shared decision making involving her cardiac care.  Prior to her visit I spent greater than 20 minutes reviewing her past medical history,  medications, and prior cardiac tests.

## 2024-01-01 ENCOUNTER — Ambulatory Visit: Admitting: General Practice

## 2024-02-02 NOTE — Progress Notes (Deleted)
 Cardiology Clinic Note   Patient Name: Anita Fletcher Date of Encounter: 02/02/2024  Primary Care Provider:  Jesus Bernardino KANDICE, MD Primary Cardiologist:  Darryle ONEIDA Decent, MD  Patient Profile    Anita Fletcher 78 year old female presents the clinic today for follow-up evaluation of her paroxysmal atrial fibrillation and hypertension.  Past Medical History    Past Medical History:  Diagnosis Date   Acute CVA (cerebrovascular accident) (HCC) 01/27/2022   Arm pain 07/09/2022   Started around mid March woke her up feels like broken arm in the right midshaft humerus feels like muscle pain feels like tightness in the shoulder feels like numbness in the hand Is not clear whether moving the arm aggravates the pain she says it does not hurt more but it does not help it She has not been able to identify anything that relieves the pain or triggers the pain The pain is like a   Hearing loss 07/10/2022   Started to be problematic a couple months after cerebrovascular accident   HTN (hypertension) 01/27/2022   Leg swelling 06/18/2022   Ms.Openshaw noticed a few weeks ago. Patient complains of numbness sensation without loss of sensation I closely evaluated her legs and she has bilateral mild pitting edema 2+ with increased sensation of edema on the right leg but I cannot really tell the difference.  There is no tenderness in her calves there is excellent circulation and pulse on bilateral dorsal pedis and posterior tibialis, excell   Neuropathic pain 04/03/2022   She is a lot more pain in her right hand that is nerve pain so I suspect she is recovering some from her stroke but I advised that she increase her gabapentin  it even though she rather avoid that because of the drowsiness am sending in a higher dose so that she at least has it available for use if the pain gets too much.  I okayed her to return to work.  I offered to send her to physical therapy a   Neuropathy 06/18/2022   Sensation of  numbness R leg only First noticed with swelling 05/2022, seemed disconnected to her CVA October to November. 2023     PAF (paroxysmal atrial fibrillation) (HCC)    TIA (transient ischemic attack)    Past Surgical History:  Procedure Laterality Date   EYE SURGERY     IR ANGIO INTRA EXTRACRAN SEL COM CAROTID INNOMINATE BILAT MOD SED  01/30/2022   IR ANGIO VERTEBRAL SEL VERTEBRAL UNI L MOD SED  01/30/2022   IR ANGIO VERTEBRAL SEL VERTEBRAL UNI L MOD SED  01/31/2022   IR ANGIOGRAM FOLLOW UP STUDY  01/31/2022   IR CT HEAD LTD  01/31/2022   IR INTRA CRAN STENT  01/31/2022   IR US  GUIDE VASC ACCESS LEFT  01/31/2022   IR US  GUIDE VASC ACCESS RIGHT  01/30/2022   RADIOLOGY WITH ANESTHESIA N/A 01/30/2022   Procedure: MRI WITH ANESTHESIA - BRAIN W/O CONSTRAST;  Surgeon: Radiologist, Medication, MD;  Location: [facility] OR;  Service: Radiology;  Laterality: N/A;   RADIOLOGY WITH ANESTHESIA N/A 01/31/2022   Procedure: RADIOLOGY WITH ANESTHESIA;  Surgeon: Radiologist, Medication, MD;  Location: [facility] OR;  Service: Radiology;  Laterality: N/A;    Allergies  Allergies  Allergen Reactions   Codeine    Levofloxacin     Other Reaction(s): muscle pains    History of Present Illness    [name] has a PMH of paroxysmal atrial fibrillation hypertension, CVA with residual deficit,  hypokalemia, insomnia, constipation, and neuropathic pain.  CHA2DS2-VASc score 3 (age, hypertension, female).  Echocardiogram showed normal LV function.  She was seen in follow-up by Dr. Barbaraann on 01/16/2022.  During that time there were concerns for anterior ischemia on her nuclear medicine stress test.  Her previous monitor showed no recurrent atrial fibrillation.  Her blood pressure was elevated at 160/100.  She indicated that her blood pressure was also elevated at home.  She denied recurrent atrial fibrillation/palpitations.  She was using a Kardia mobile device.  She denied chest pain and trouble with her breathing.  She did not have  formal exercise routine.  She reported compliance with apixaban  and denied bleeding issues.  A BMP and coronary CTA were ordered but not completed.  Losartan  50 mg daily was also prescribed.    She presented to the clinic 05/16/22 for follow-up evaluation and stated she had two CVA's.  Her first CVA was October 30.  She was undergoing carotid endarterectomy and had a second CVA during the procedure.  She was then sent to rehab and is not driving at this time.  She was unable to complete coronary CTA.  We reviewed her previous stress testing and she expressed understanding.  She did not feel that she was taking metoprolol  and had noted episodes of atrial fibrillation.  I started metoprolol  tartrate 12.5 mg daily and  instructed her that she may take an extra 12.5 mg for episodes of sustained palpitations.  We reviewed triggers for atrial fibrillation.  I will plan follow-up in 3 to 4 months.    She presented to the clinic 08/19/22 for follow-up evaluation and stated her blood pressure had been elevated in the 150s-160 systolic at home.  She continued to work on physical therapy movements for her right side.  She was regaining sensation in her right arm.  She reported that she would have a CT which was scheduled for the 31st.  We reviewed need for coronary CTA.  She wished to defer at this time due to not being able to drive.  I stopped her losartan  and started valsartan  80 mg.  Her repeat lab work was stable.  She presents to the clinic today for follow-up evaluation states she notices a fullness or submersion type sensation in the mornings since having her stroke.  She notes that her blood pressure has been running in the 160 systolic at home.  She has completed physical therapy exercises.  She continues to notice some right upper extremity numbness and pain.  She has not had recent follow-up with her neurologist.  We reviewed her recommendation for coronary CTA.  She wishes to defer that at this time.   Initially her blood pressure in clinic today is 164/92 and on recheck it is 136/76.  I reviewed the importance of heart healthy low-sodium diet.  I will increase her valsartan  to 160 mg daily, repeat BMP in 1 week and plan follow-up in 1 to 2 months..  Today she denies chest pain, shortness of breath, lower extremity edema, fatigue, palpitations, melena, hematuria, hemoptysis, diaphoresis, weakness, presyncope, syncope, orthopnea, and PND.   Home Medications    Prior to Admission medications   Medication Sig Start Date End Date Taking? Authorizing Provider  amLODipine  (NORVASC ) 5 MG tablet Take 1 tablet (5 mg total) by mouth daily. 02/28/22   Jesus Bernardino MATSU, MD  apixaban  (ELIQUIS ) 5 MG TABS tablet Take 1 tablet (5 mg total) by mouth 2 (two) times daily. 02/28/22   Jesus Bernardino  G, MD  atorvastatin  (LIPITOR) 40 MG tablet Take 1 tablet (40 mg total) by mouth daily. 02/28/22   Jesus Bernardino MATSU, MD  diclofenac  Sodium (VOLTAREN ) 1 % GEL Apply 2 g topically 4 (four) times daily. 02/13/22   Love, Sharlet RAMAN, PA-C  fluticasone  (FLONASE ) 50 MCG/ACT nasal spray Use 1 spray in each nostril once daily. 02/14/22   Love, Sharlet RAMAN, PA-C  gabapentin  (NEURONTIN ) 100 MG capsule Take 1 capsule (100 mg total) by mouth 3 (three) times daily. 04/03/22   Jesus Bernardino MATSU, MD  lidocaine  (XYLOCAINE ) 5 % ointment Apply 1 Application topically as needed. 04/03/22   Jesus Bernardino MATSU, MD  loratadine  (CLARITIN ) 10 MG tablet Take 1 tablet (10 mg total) by mouth daily. 03/18/22   Jesus Bernardino MATSU, MD  losartan  (COZAAR ) 100 MG tablet Take 1 tablet (100 mg total) by mouth daily. 04/03/22   Jesus Bernardino MATSU, MD  melatonin 5 MG TABS Take 1 tablet (5 mg total) by mouth at bedtime. 02/28/22   Jesus Bernardino MATSU, MD  metoprolol  tartrate (LOPRESSOR ) 25 MG tablet Take 25 mg by mouth daily. 04/23/22   [provider]  potassium chloride  (KLOR-CON  M) 10 MEQ tablet Take 1 tablet (10 mEq total) by mouth daily. 03/18/22   Jesus Bernardino MATSU, MD   senna-docusate (SENOKOT-S) 8.6-50 MG tablet Take 1 tablet by mouth 2 (two) times daily. 02/28/22   Jesus Bernardino MATSU, MD  ticagrelor  (BRILINTA ) 90 MG TABS tablet Take 1 tablet (90 mg total) by mouth 2 (two) times daily. 02/28/22   Jesus Bernardino MATSU, MD  DULoxetine  (CYMBALTA ) 30 MG capsule Take 30 mg by mouth daily. Then increase to two 30 mg tablets (total 60 mg) by mouth once daily  04/17/22  [provider]    Family History    Family History  Problem Relation Age of Onset   Alcoholism Father    Arrhythmia Brother    Diabetes Brother    Mental illness Brother    She indicated that her mother is deceased. She indicated that her father is deceased. She indicated that only one of her two brothers is alive.  Social History    Social History   Socioeconomic History   Marital status: Single    Spouse name: Not on file   Number of children: 0   Years of education: Not on file   Highest education level: Some college, no degree  Occupational History   Occupation: Resume building  Tobacco Use   Smoking status: Never   Smokeless tobacco: Never  Vaping Use   Vaping status: Never Used  Substance and Sexual Activity   Alcohol use: No   Drug use: No   Sexual activity: Not on file  Other Topics Concern   Not on file  Social History Narrative   Not on file   Social Drivers of Health   Financial Resource Strain: Low Risk  (06/13/2023)   Overall Financial Resource Strain (CARDIA)    Difficulty of Paying Living Expenses: Not very hard  Food Insecurity: No Food Insecurity (06/13/2023)   Hunger Vital Sign    Worried About Running Out of Food in the Last Year: Never true    Ran Out of Food in the Last Year: Never true  Transportation Needs: Unmet Transportation Needs (06/13/2023)   PRAPARE - Administrator, Civil Service (Medical): Yes    Lack of Transportation (Non-Medical): No  Physical Activity: Unknown (06/13/2023)   Exercise Vital Sign    Days of  Exercise per  Week: 0 days    Minutes of Exercise per Session: Not on file  Stress: Stress Concern Present (06/13/2023)   [employer] of Occupational Health - Occupational Stress Questionnaire    Feeling of Stress : To some extent  Social Connections: Socially Isolated (06/13/2023)   Social Connection and Isolation Panel    Frequency of Communication with Friends and Family: Three times a week    Frequency of Social Gatherings with Friends and Family: Never    Attends Religious Services: Never    Database Administrator or Organizations: No    Attends Engineer, Structural: Not on file    Marital Status: Never married  Intimate Partner Violence: Not At Risk (01/29/2022)   Humiliation, Afraid, Rape, and Kick questionnaire    Fear of Current or Ex-Partner: No    Emotionally Abused: No    Physically Abused: No    Sexually Abused: No     Review of Systems    General:  No chills, fever, night sweats or weight changes.  Cardiovascular:  No chest pain, dyspnea on exertion, edema, orthopnea, palpitations, paroxysmal nocturnal dyspnea. Dermatological: No rash, lesions/masses Respiratory: No cough, dyspnea Urologic: No hematuria, dysuria Abdominal:   No nausea, vomiting, diarrhea, bright red blood per rectum, melena, or hematemesis Neurologic:  No visual changes, wkns, changes in mental status. All other systems reviewed and are otherwise negative except as noted above.  Physical Exam    VS:  There were no vitals taken for this visit. , BMI There is no height or weight on file to calculate BMI. GEN: Well nourished, well developed, in no acute distress. HEENT: normal. Neck: Supple, no JVD, carotid bruits, or masses. Cardiac: RRR, no murmurs, rubs, or gallops. No clubbing, cyanosis, edema.  Radials/DP/PT 2+ and equal bilaterally.  Respiratory:  Respirations regular and unlabored, clear to auscultation bilaterally. GI: Soft, nontender, nondistended, BS + x 4. MS: no deformity or  atrophy. Skin: warm and dry, no rash. Neuro:  Strength and sensation are intact. Psych: Normal affect.  Accessory Clinical Findings    Recent Labs: 08/18/2023: BUN 11; Creatinine, Ser 0.82; Hemoglobin 14.1; Platelets 252; Potassium 3.6; Sodium 136   Recent Lipid Panel    Component Value Date/Time   CHOL 226 (H) 01/28/2022 0319   TRIG 253 (H) 01/28/2022 0319   HDL 35 (L) 01/28/2022 0319   CHOLHDL 6.5 01/28/2022 0319   VLDL 51 (H) 01/28/2022 0319   LDLCALC 140 (H) 01/28/2022 0319    No BP recorded.  {Refresh Note OR Click here to enter BP  :1}***    ECG personally reviewed by me today-EKG Interpretation Date/Time:    Ventricular Rate:    PR Interval:    QRS Duration:    QT Interval:    QTC Calculation:   R Axis:      Text Interpretation:     Echocardiogram 01/28/2022 IMPRESSIONS     1. Limited study.   2. Left ventricular ejection fraction, by estimation, is 70 to 75%. The  left ventricle has hyperdynamic function. 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 Left ventricular diastolic  function could not be evaluated.   3. The mitral valve is abnormal, mild to moderate annular calcification.   4. The aortic valve is tricuspid. Aortic valve sclerosis is present, with  no evidence of aortic valve stenosis.   5. The inferior vena cava is normal in size with greater than 50%  respiratory variability, suggesting right atrial  pressure of 3 mmHg.   FINDINGS   Left Ventricle: Left ventricular ejection fraction, by estimation, is 70  to 75%. The left ventricle has hyperdynamic function. The left ventricular  internal cavity size was normal in size. There is severe asymmetric left  ventricular hypertrophy of the  basal segment. Left ventricular diastolic function could not be evaluated.   Pericardium: There is no evidence of pericardial effusion. Presence of  epicardial fat layer.   Mitral Valve: The mitral valve is abnormal. Mild to  moderate mitral  annular calcification.   Tricuspid Valve: The tricuspid valve is grossly normal.   Aortic Valve: The aortic valve is tricuspid. There is mild aortic valve  annular calcification. Aortic valve sclerosis is present, with no evidence  of aortic valve stenosis.   Aorta: The aortic root is normal in size and structure.   Venous: The inferior vena cava is normal in size with greater than 50%  respiratory variability, suggesting right atrial pressure of 3 mmHg.   IAS/Shunts: The interatrial septum was not assessed.    Nuclear stress test 12/17/2021    Findings are consistent with ischemia. The study is low-intermediate risk.   No ST deviation was noted. ECG rhythm shows sinus bradycardia at rest.   LV perfusion is abnormal. There is evidence of ischemia. Defect 1: There is a small defect with moderate reduction in uptake present in the apical to mid anterior location(s) that is partially reversible (see graphic). There is abnormal wall motion in the defect area. Consistent with ischemia given anteroapical wall motion abnormality.   Left ventricular function is grossly normal. Nuclear stress EF: 50 %, however visually appears closer to 60%.  End diastolic cavity size is normal. End systolic cavity size is normal.   Prior study not available for comparison.  Assessment & Plan   1.  Abnormal cardiac stress test-denies exertional chest discomfort.  Previously she was noted to have mild anterior defect on nuclear stress testing.  It was felt that this may be related to artifact.  CCTA was ordered for further evaluation but not completed.  She has history of CVA x 2 at the end of 2023.  Also, CVA October 30 and post CVA she underwent  carotid stenting and had second CVA she is right side affected.     She was to coronary CT at this time.  Essential hypertension-BP today 136/76   Continue   amlodipine , metoprolol ,  Increase valsartan  to 160 mg daily  Maintain blood pressure  log Heart healthy diet  Paroxysmal atrial fibrillation-heart rate today 54 bpm.  Continues to report compliance with apixaban .  Denies trauma and bleeding issues.   Denies sustained irregular heartbeats or palpitations. Continue  apixaban , metoprolol  12.5 mg daily.  May take an extra 12.5 mg for sustained palpitations.-Again reviewed Heart healthy low-sodium diet-continue Avoid triggers caffeine, chocolate, EtOH, dehydration etc.-reviewed      Disposition: Follow-up with Dr. Barbaraann or me after testing 1-2*** months  Kathlynn Swofford M. Waverly Chavarria NP-C     02/02/2024, 1:13 PM [facility] [address] Office [phone] Fax [phone]    I spent 14 minutes examining this patient, reviewing medications, and using patient centered shared decision making involving her cardiac care.  Prior to her visit I spent greater than 20 minutes reviewing her past medical history,  medications, and prior cardiac tests.

## 2024-02-05 ENCOUNTER — Ambulatory Visit: Admitting: General Practice

## 2024-02-08 ENCOUNTER — Other Ambulatory Visit: Payer: Self-pay

## 2024-02-10 ENCOUNTER — Other Ambulatory Visit: Payer: Self-pay | Admitting: Internal Medicine

## 2024-02-10 DIAGNOSIS — I480 Paroxysmal atrial fibrillation: Secondary | ICD-10-CM

## 2024-02-10 MED ORDER — METOPROLOL TARTRATE 25 MG PO TABS: 12.5000 mg | ORAL_TABLET | Freq: Every day | ORAL | 2 refills | Status: AC

## 2024-02-23 ENCOUNTER — Other Ambulatory Visit: Payer: Self-pay | Admitting: Internal Medicine

## 2024-02-23 DIAGNOSIS — M79601 Pain in right arm: Secondary | ICD-10-CM

## 2024-03-07 NOTE — Progress Notes (Deleted)
 Cardiology Clinic Note   Patient Name: Anita Fletcher Date of Encounter: 03/07/2024  Primary Care Provider:  Jesus Bernardino KANDICE, MD Primary Cardiologist:  Anita ONEIDA Decent, MD  Patient Profile    Anita Fletcher 78 year old female presents to the clinic today for follow-up evaluation of her paroxysmal atrial fibrillation and HTN.  Past Medical History    Past Medical History:  Diagnosis Date   Acute CVA (cerebrovascular accident) (HCC) 01/27/2022   Arm pain 07/09/2022   Started around mid March woke her up feels like broken arm in the right midshaft humerus feels like muscle pain feels like tightness in the shoulder feels like numbness in the hand Is not clear whether moving the arm aggravates the pain she says it does not hurt more but it does not help it She has not been able to identify anything that relieves the pain or triggers the pain The pain is like a   Hearing loss 07/10/2022   Started to be problematic a couple months after cerebrovascular accident   HTN (hypertension) 01/27/2022   Leg swelling 06/18/2022   Anita Fletcher noticed a few weeks ago. Patient complains of numbness sensation without loss of sensation I closely evaluated her legs and she has bilateral mild pitting edema 2+ with increased sensation of edema on the right leg but I cannot really tell the difference.  There is no tenderness in her calves there is excellent circulation and pulse on bilateral dorsal pedis and posterior tibialis, excell   Neuropathic pain 04/03/2022   She is a lot more pain in her right hand that is nerve pain so I suspect she is recovering some from her stroke but I advised that she increase her gabapentin  it even though she rather avoid that because of the drowsiness am sending in a higher dose so that she at least has it available for use if the pain gets too much.  I okayed her to return to work.  I offered to send her to physical therapy a   Neuropathy 06/18/2022   Sensation of numbness R  leg only First noticed with swelling 05/2022, seemed disconnected to her CVA October to November. 2023     PAF (paroxysmal atrial fibrillation) (HCC)    TIA (transient ischemic attack)    Past Surgical History:  Procedure Laterality Date   EYE SURGERY     IR ANGIO INTRA EXTRACRAN SEL COM CAROTID INNOMINATE BILAT MOD SED  01/30/2022   IR ANGIO VERTEBRAL SEL VERTEBRAL UNI L MOD SED  01/30/2022   IR ANGIO VERTEBRAL SEL VERTEBRAL UNI L MOD SED  01/31/2022   IR ANGIOGRAM FOLLOW UP STUDY  01/31/2022   IR CT HEAD LTD  01/31/2022   IR INTRA CRAN STENT  01/31/2022   IR US  GUIDE VASC ACCESS LEFT  01/31/2022   IR US  GUIDE VASC ACCESS RIGHT  01/30/2022   RADIOLOGY WITH ANESTHESIA N/A 01/30/2022   Procedure: MRI WITH ANESTHESIA - BRAIN W/O CONSTRAST;  Surgeon: Radiologist, Medication, MD;  Location: [facility] OR;  Service: Radiology;  Laterality: N/A;   RADIOLOGY WITH ANESTHESIA N/A 01/31/2022   Procedure: RADIOLOGY WITH ANESTHESIA;  Surgeon: Radiologist, Medication, MD;  Location: [facility] OR;  Service: Radiology;  Laterality: N/A;    Allergies  Allergies  Allergen Reactions   Codeine    Levofloxacin     Other Reaction(s): muscle pains    History of Present Illness    Anita Fletcher has a PMH of paroxysmal atrial fibrillation hypertension, CVA with residual  deficit, hypokalemia, insomnia, constipation, and neuropathic pain.  CHA2DS2-VASc score 3 (age, hypertension, female).  Echocardiogram showed normal LV function.  She was seen in follow-up by Dr. Barbaraann on 01/16/2022.  During that time there were concerns for anterior ischemia on her nuclear medicine stress test.  Her previous monitor showed no recurrent atrial fibrillation.  Her blood pressure was elevated at 160/100.  She indicated that her blood pressure was also elevated at home.  She denied recurrent atrial fibrillation/palpitations.  She was using a Kardia mobile device.  She denied chest pain and trouble with her breathing.  She did not have formal  exercise routine.  She reported compliance with apixaban  and denied bleeding issues.  A BMP and coronary CTA were ordered but not completed.  Losartan  50 mg daily was also prescribed.    She presented to the clinic 05/16/22 for follow-up evaluation and stated she had two CVA's.  Her first CVA was October 30.  She was undergoing carotid endarterectomy and had a second CVA during the procedure.  She was then sent to rehab and is not driving at this time.  She was unable to complete coronary CTA.  We reviewed her previous stress testing and she expressed understanding.  She did not feel that she was taking metoprolol  and had noted episodes of atrial fibrillation.  I started metoprolol  tartrate 12.5 mg daily and  instructed her that she may take an extra 12.5 mg for episodes of sustained palpitations.  We reviewed triggers for atrial fibrillation.  I will plan follow-up in 3 to 4 months.    She presented to the clinic 08/19/22 for follow-up evaluation and stated her blood pressure had been elevated in the 150s-160 systolic at home.  She continued to work on physical therapy movements for her right side.  She was regaining sensation in her right arm.  She reported that she would have a CT which was scheduled for the 31st.  We reviewed need for coronary CTA.  She wished to defer at this time due to not being able to drive.  I stopped her losartan  and started valsartan  80 mg.  Her repeat lab work was stable.  She presented to the clinic 09/09/23 for follow-up evaluation stated she noticed a fullness or submersion type sensation in the mornings since having her stroke.  She noted that her blood pressure had been running in the 160 systolic at home.  She had completed physical therapy exercises.  She continued to notice some right upper extremity numbness and pain.  She had not had recent follow-up with her neurologist.  We reviewed her recommendation for coronary CTA.  She wished to defer that at that time.  Initially  her blood pressure in clinic was 164/92 and on recheck was 136/76.  I reviewed the importance of heart healthy low-sodium diet.  I increased her valsartan  to 160 mg daily, repeat BMP in 1 week and planned follow-up in 1 to 2 months.  Follow-up labs were not completed.  She presents to the clinic today for follow-up evaluation and states***.  Today she denies chest pain, shortness of breath, lower extremity edema, fatigue, palpitations, melena, hematuria, hemoptysis, diaphoresis, weakness, presyncope, syncope, orthopnea, and PND.   Home Medications    Prior to Admission medications   Medication Sig Start Date End Date Taking? Authorizing Provider  amLODipine  (NORVASC ) 5 MG tablet Take 1 tablet (5 mg total) by mouth daily. 02/28/22   Anita Bernardino MATSU, MD  apixaban  (ELIQUIS ) 5 MG TABS tablet Take  1 tablet (5 mg total) by mouth 2 (two) times daily. 02/28/22   Anita Bernardino MATSU, MD  atorvastatin  (LIPITOR) 40 MG tablet Take 1 tablet (40 mg total) by mouth daily. 02/28/22   Anita Bernardino MATSU, MD  diclofenac  Sodium (VOLTAREN ) 1 % GEL Apply 2 g topically 4 (four) times daily. 02/13/22   Love, Sharlet RAMAN, PA-C  fluticasone  (FLONASE ) 50 MCG/ACT nasal spray Use 1 spray in each nostril once daily. 02/14/22   Love, Sharlet RAMAN, PA-C  gabapentin  (NEURONTIN ) 100 MG capsule Take 1 capsule (100 mg total) by mouth 3 (three) times daily. 04/03/22   Anita Bernardino MATSU, MD  lidocaine  (XYLOCAINE ) 5 % ointment Apply 1 Application topically as needed. 04/03/22   Anita Bernardino MATSU, MD  loratadine  (CLARITIN ) 10 MG tablet Take 1 tablet (10 mg total) by mouth daily. 03/18/22   Anita Bernardino MATSU, MD  losartan  (COZAAR ) 100 MG tablet Take 1 tablet (100 mg total) by mouth daily. 04/03/22   Anita Bernardino MATSU, MD  melatonin 5 MG TABS Take 1 tablet (5 mg total) by mouth at bedtime. 02/28/22   Anita Bernardino MATSU, MD  metoprolol  tartrate (LOPRESSOR ) 25 MG tablet Take 25 mg by mouth daily. 04/23/22   [provider]  potassium chloride  (KLOR-CON   M) 10 MEQ tablet Take 1 tablet (10 mEq total) by mouth daily. 03/18/22   Anita Bernardino MATSU, MD  senna-docusate (SENOKOT-S) 8.6-50 MG tablet Take 1 tablet by mouth 2 (two) times daily. 02/28/22   Anita Bernardino MATSU, MD  ticagrelor  (BRILINTA ) 90 MG TABS tablet Take 1 tablet (90 mg total) by mouth 2 (two) times daily. 02/28/22   Anita Bernardino MATSU, MD  DULoxetine  (CYMBALTA ) 30 MG capsule Take 30 mg by mouth daily. Then increase to two 30 mg tablets (total 60 mg) by mouth once daily  04/17/22  [provider]    Family History    Family History  Problem Relation Age of Onset   Alcoholism Father    Arrhythmia Brother    Diabetes Brother    Mental illness Brother    She indicated that her mother is deceased. She indicated that her father is deceased. She indicated that only one of her two brothers is alive.  Social History    Social History   Socioeconomic History   Marital status: Single    Spouse name: Not on file   Number of children: 0   Years of education: Not on file   Highest education level: Some college, no degree  Occupational History   Occupation: Resume building  Tobacco Use   Smoking status: Never   Smokeless tobacco: Never  Vaping Use   Vaping status: Never Used  Substance and Sexual Activity   Alcohol use: No   Drug use: No   Sexual activity: Not on file  Other Topics Concern   Not on file  Social History Narrative   Not on file   Social Drivers of Health   Financial Resource Strain: Low Risk  (06/13/2023)   Overall Financial Resource Strain (CARDIA)    Difficulty of Paying Living Expenses: Not very hard  Food Insecurity: No Food Insecurity (06/13/2023)   Hunger Vital Sign    Worried About Running Out of Food in the Last Year: Never true    Ran Out of Food in the Last Year: Never true  Transportation Needs: Unmet Transportation Needs (06/13/2023)   PRAPARE - Administrator, Civil Service (Medical): Yes    Lack of Transportation (Non-Medical):  No  Physical Activity: Unknown (06/13/2023)   Exercise Vital Sign    Days of Exercise per Week: 0 days    Minutes of Exercise per Session: Not on file  Stress: Stress Concern Present (06/13/2023)   [employer] of Occupational Health - Occupational Stress Questionnaire    Feeling of Stress : To some extent  Social Connections: Socially Isolated (06/13/2023)   Social Connection and Isolation Panel    Frequency of Communication with Friends and Family: Three times a week    Frequency of Social Gatherings with Friends and Family: Never    Attends Religious Services: Never    Database Administrator or Organizations: No    Attends Engineer, Structural: Not on file    Marital Status: Never married  Intimate Partner Violence: Not At Risk (01/29/2022)   Humiliation, Afraid, Rape, and Kick questionnaire    Fear of Current or Ex-Partner: No    Emotionally Abused: No    Physically Abused: No    Sexually Abused: No     Review of Systems    General:  No chills, fever, night sweats or weight changes.  Cardiovascular:  No chest pain, dyspnea on exertion, edema, orthopnea, palpitations, paroxysmal nocturnal dyspnea. Dermatological: No rash, lesions/masses Respiratory: No cough, dyspnea Urologic: No hematuria, dysuria Abdominal:   No nausea, vomiting, diarrhea, bright red blood per rectum, melena, or hematemesis Neurologic:  No visual changes, wkns, changes in mental status. All other systems reviewed and are otherwise negative except as noted above.  Physical Exam    VS:  There were no vitals taken for this visit. , BMI There is no height or weight on file to calculate BMI. GEN: Well nourished, well developed, in no acute distress. HEENT: normal. Neck: Supple, no JVD, carotid bruits, or masses. Cardiac: RRR, no murmurs, rubs, or gallops. No clubbing, cyanosis, edema.  Radials/DP/PT 2+ and equal bilaterally.  Respiratory:  Respirations regular and unlabored, clear to auscultation  bilaterally. GI: Soft, nontender, nondistended, BS + x 4. MS: no deformity or atrophy. Skin: warm and dry, no rash. Neuro:  Strength and sensation are intact. Psych: Normal affect.  Accessory Clinical Findings    Recent Labs: 08/18/2023: BUN 11; Creatinine, Ser 0.82; Hemoglobin 14.1; Platelets 252; Potassium 3.6; Sodium 136   Recent Lipid Panel    Component Value Date/Time   CHOL 226 (H) 01/28/2022 0319   TRIG 253 (H) 01/28/2022 0319   HDL 35 (L) 01/28/2022 0319   CHOLHDL 6.5 01/28/2022 0319   VLDL 51 (H) 01/28/2022 0319   LDLCALC 140 (H) 01/28/2022 0319    No BP recorded.  {Refresh Note OR Click here to enter BP  :1}***    ECG personally reviewed by me today-EKG Interpretation Date/Time:    Ventricular Rate:    PR Interval:    QRS Duration:    QT Interval:    QTC Calculation:   R Axis:      Text Interpretation:     Echocardiogram 01/28/2022 IMPRESSIONS     1. Limited study.   2. Left ventricular ejection fraction, by estimation, is 70 to 75%. The  left ventricle has hyperdynamic function. 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 Left ventricular diastolic  function could not be evaluated.   3. The mitral valve is abnormal, mild to moderate annular calcification.   4. The aortic valve is tricuspid. Aortic valve sclerosis is present, with  no evidence of aortic valve stenosis.   5. The inferior vena  cava is normal in size with greater than 50%  respiratory variability, suggesting right atrial pressure of 3 mmHg.   FINDINGS   Left Ventricle: Left ventricular ejection fraction, by estimation, is 70  to 75%. The left ventricle has hyperdynamic function. The left ventricular  internal cavity size was normal in size. There is severe asymmetric left  ventricular hypertrophy of the  basal segment. Left ventricular diastolic function could not be evaluated.   Pericardium: There is no evidence of pericardial effusion. Presence of   epicardial fat layer.   Mitral Valve: The mitral valve is abnormal. Mild to moderate mitral  annular calcification.   Tricuspid Valve: The tricuspid valve is grossly normal.   Aortic Valve: The aortic valve is tricuspid. There is mild aortic valve  annular calcification. Aortic valve sclerosis is present, with no evidence  of aortic valve stenosis.   Aorta: The aortic root is normal in size and structure.   Venous: The inferior vena cava is normal in size with greater than 50%  respiratory variability, suggesting right atrial pressure of 3 mmHg.   IAS/Shunts: The interatrial septum was not assessed.    Nuclear stress test 12/17/2021    Findings are consistent with ischemia. The study is low-intermediate risk.   No ST deviation was noted. ECG rhythm shows sinus bradycardia at rest.   LV perfusion is abnormal. There is evidence of ischemia. Defect 1: There is a small defect with moderate reduction in uptake present in the apical to mid anterior location(s) that is partially reversible (see graphic). There is abnormal wall motion in the defect area. Consistent with ischemia given anteroapical wall motion abnormality.   Left ventricular function is grossly normal. Nuclear stress EF: 50 %, however visually appears closer to 60%.  End diastolic cavity size is normal. End systolic cavity size is normal.   Prior study not available for comparison.  Assessment & Plan   1.Essential hypertension-BP today 13***6/76   Continue   amlodipine , metoprolol ,  Increase valsartan  to 160 mg daily*** Maintain blood pressure log Heart healthy diet   Order BMP  Abnormal cardiac stress test-con***tinues with no chest discomfort.  Denies exertional chest pain.  Previously she was noted to have mild anterior defect on nuclear stress testing.  It was felt that this may be related to artifact.  CCTA was ordered for further evaluation but not completed.  She has history of CVA x 2 at the end of 2023.  Also,  CVA October 30 and post CVA she underwent  carotid stenting and had second CVA she is right side affected.     Order coronary CTA  Paroxysmal atrial fibrillation-heart rate today 5***4 bpm.  Compliant with apixaban  denies trauma and bleeding issues.  Continues to deny irregular heartbeats or palpitations. Continue  apixaban , metoprolol  12.5 mg daily.  May take an extra 12.5 mg for sustained palpitations Heart healthy low-sodium diet-continue Avoid triggers caffeine, chocolate, EtOH, dehydration etc.-reviewed      Disposition: Follow-up with Dr. Barbaraann or me after coronary CTA  Josefa HERO. Verlee Pope NP-C     03/07/2024, 7:20 AM [facility] [address] Office [phone] Fax [phone]    I spent 14***minutes examining this patient, reviewing medications, and using patient centered shared decision making involving her cardiac care.  Prior to her visit I spent greater than 20 minutes reviewing her past medical history,  medications, and prior cardiac tests.

## 2024-03-09 ENCOUNTER — Ambulatory Visit: Admitting: General Practice

## 2024-05-10 ENCOUNTER — Ambulatory Visit: Admitting: General Practice
